# Patient Record
Sex: Male | Born: 1980 | Race: Black or African American | Hispanic: No | Marital: Single | Smoking: Former smoker
Health system: Southern US, Community
[De-identification: ages and names within clinical notes are randomized; demographics above are authoritative.]

## PROBLEM LIST (undated history)

## (undated) DIAGNOSIS — G8929 Other chronic pain: Secondary | ICD-10-CM

## (undated) DIAGNOSIS — F209 Schizophrenia, unspecified: Secondary | ICD-10-CM

## (undated) DIAGNOSIS — Z9119 Patient's noncompliance with other medical treatment and regimen: Secondary | ICD-10-CM

## (undated) DIAGNOSIS — F329 Major depressive disorder, single episode, unspecified: Secondary | ICD-10-CM

## (undated) DIAGNOSIS — F4310 Post-traumatic stress disorder, unspecified: Secondary | ICD-10-CM

## (undated) DIAGNOSIS — F419 Anxiety disorder, unspecified: Secondary | ICD-10-CM

## (undated) DIAGNOSIS — R519 Headache, unspecified: Secondary | ICD-10-CM

## (undated) DIAGNOSIS — R51 Headache: Secondary | ICD-10-CM

## (undated) DIAGNOSIS — F319 Bipolar disorder, unspecified: Secondary | ICD-10-CM

## (undated) DIAGNOSIS — F32A Depression, unspecified: Secondary | ICD-10-CM

## (undated) DIAGNOSIS — K5090 Crohn's disease, unspecified, without complications: Secondary | ICD-10-CM

## (undated) DIAGNOSIS — M1990 Unspecified osteoarthritis, unspecified site: Secondary | ICD-10-CM

## (undated) DIAGNOSIS — I10 Essential (primary) hypertension: Secondary | ICD-10-CM

## (undated) DIAGNOSIS — K219 Gastro-esophageal reflux disease without esophagitis: Secondary | ICD-10-CM

## (undated) HISTORY — DX: Other chronic pain: G89.29

## (undated) HISTORY — DX: Headache, unspecified: R51.9

## (undated) HISTORY — DX: Bipolar disorder, unspecified: F31.9

## (undated) HISTORY — DX: Crohn's disease, unspecified, without complications: K50.90

## (undated) HISTORY — DX: Depression, unspecified: F32.A

## (undated) HISTORY — DX: Major depressive disorder, single episode, unspecified: F32.9

## (undated) HISTORY — DX: Essential (primary) hypertension: I10

## (undated) HISTORY — DX: Headache: R51

## (undated) HISTORY — PX: ILEOSTOMY: SHX1783

## (undated) HISTORY — DX: Anxiety disorder, unspecified: F41.9

## (undated) HISTORY — DX: Schizophrenia, unspecified: F20.9

## (undated) HISTORY — DX: Unspecified osteoarthritis, unspecified site: M19.90

## (undated) HISTORY — DX: Gastro-esophageal reflux disease without esophagitis: K21.9

## (undated) HISTORY — PX: NO PAST SURGERIES: SHX2092

---

## 2001-11-14 ENCOUNTER — Inpatient Hospital Stay (HOSPITAL_COMMUNITY): Admission: EM | Admit: 2001-11-14 | Discharge: 2001-11-15 | Payer: Self-pay | Admitting: *Deleted

## 2011-07-28 ENCOUNTER — Ambulatory Visit: Payer: Self-pay | Admitting: Family Medicine

## 2013-04-11 ENCOUNTER — Encounter: Payer: Self-pay | Admitting: Physician Assistant

## 2013-04-11 ENCOUNTER — Ambulatory Visit (INDEPENDENT_AMBULATORY_CARE_PROVIDER_SITE_OTHER): Payer: Medicare Other | Admitting: Physician Assistant

## 2013-04-11 VITALS — BP 132/74 | HR 72 | Temp 97.6°F | Resp 16 | Ht 66.0 in | Wt 198.0 lb

## 2013-04-11 DIAGNOSIS — F319 Bipolar disorder, unspecified: Secondary | ICD-10-CM

## 2013-04-11 DIAGNOSIS — Z0000 Encounter for general adult medical examination without abnormal findings: Secondary | ICD-10-CM

## 2013-04-11 DIAGNOSIS — K5090 Crohn's disease, unspecified, without complications: Secondary | ICD-10-CM

## 2013-04-11 DIAGNOSIS — F209 Schizophrenia, unspecified: Secondary | ICD-10-CM

## 2013-04-11 DIAGNOSIS — G43009 Migraine without aura, not intractable, without status migrainosus: Secondary | ICD-10-CM

## 2013-04-11 DIAGNOSIS — R109 Unspecified abdominal pain: Secondary | ICD-10-CM

## 2013-04-11 DIAGNOSIS — K219 Gastro-esophageal reflux disease without esophagitis: Secondary | ICD-10-CM

## 2013-04-11 NOTE — Progress Notes (Signed)
Patient ID: Jeffery Mcintosh is a 33 y.o. male DOB: [phone] MRN: [id number]     HPI:  Patient is a 33 year old male who presents to the office to establish care. Reports history of bipolar and schizophrenia treated with Gabapentin 300 mg and Seroquel 25 mg daily. Follows with [facility]. Reports history of crohn's as a young adult. Reports he was on prednisone and two other unknown medications back when first diagnosed however, has not been on medications for some time. Reports he has intermittent blood in his stool along with intermittent diarrhea. Is not currently having bloody stool. Reports he is having constant dull abdominal pain for the last month with intermittent sharp pains. Reports pain is similar to his past pain when diagnosed with crohn's. Symptoms are worse at bedtime and in the morning with no known triggers. Do not become worse or better with introduction of food. Laying in fetal position offers moderate relief of pain. Also reports history of heartburn, states he has it all day everyday not based on what he eats. Has used Prevacid in the past with little relief of symptoms. Denies fever, chills, weight loss, chest pain/palpitations, vomiting, lightheaded, dizzy or weakness. Has not had colonoscopy since 1998.    Influenza: does not take Tetanus: unknown   ROS: As stated in HPI. All other systems negative  Past Medical History  Diagnosis Date  . Arthritis   . Depression   . GERD (gastroesophageal reflux disease)   . Hypertension   . Crohn's disease history of  . Bipolar affective   . Schizophrenic disorder    No family history on file. History   Social History  . Marital Status: Single    Spouse Name: N/A    Number of Children: N/A  . Years of Education: N/A   Social History Main Topics  . Smoking status: Current Every Day Smoker  . Smokeless tobacco: None  . Alcohol Use: No  . Drug Use: No  . Sexual Activity: None   Other Topics  Concern  . None   Social History Narrative  . None   History reviewed. No pertinent past surgical history. No current outpatient prescriptions on file prior to visit.   No current facility-administered medications on file prior to visit.   Allergies not on file  PE:  Filed Vitals:   04/11/13 1319  BP: 132/74  Pulse: 72  Temp: 97.6 F (36.4 C)  Resp: 16    CONSTITUTIONAL: Well developed, well nourished, pleasant, appears stated age, in NAD HEENT: normocephalic, atraumatic, bilateral ext/int canals normal. Bilateral TM's without injections, bulging, erythema. Nose normal, uvula midline, oropharynx clear and moist. EYES: PERRLA, bilateral EOM and conjunctiva normal NECK: FROM, supple, without thyromegaly or mass CARDIO: RRR, normal S1 and S2, distal pulses intact. PULM/CHEST CTA bilateral, no wheezes, rales or rhonchi. Non tender. ABD: appearance normal, soft, generalized tenderness of bilateral U/L quadrants. No rebound or guarding. Normal bowel sounds x 4 quadrants, nonpalpable liver, kidney or spleen.  MUSC: FROM U/LE bilateral, FROM thoracic and lumbar spine, no midline tenderness.  LYMPH: no cervical, supraclavicular adenopathy NEURO: alert and oriented x 3, no cranial nerve deficit, motor strength and coordination NL. DTR's intact. Gait normal. SKIN: warm, dry, no rash or lesions noted. PSYCH: Mood and affect normal, speech normal.   ASSESSMENT and PLAN   CPX/v70.0 - Patient has been counseled on age-appropriate routine health concerns for screening and prevention. These are reviewed and up-to-date. Immunizations are up-to-date or declined. Labs  ordered and will be reviewed.  Abdominal pain Generalized pain, history of crohn's Referral to GI  GERD Samples of Nexium 20 mg one tab daily samples for 10 days  Bipolar/schizophrenia Continue with current medications as prescribed Continue follow up with [facility] mental health.

## 2013-04-11 NOTE — Patient Instructions (Signed)
It was great meeting you today Jeffery Mcintosh!  Labs have been ordered for you, when you report to lab please be fasting.    I have provided samples of Nexium 20 mg tablet, please take once daily for heartburn relief.  I have placed a referral to gastroenterology for evaluation of your crohn's, the patient care coordinator will phone you to make arrangements.     Health Maintenance, Males A healthy lifestyle and preventative care can promote health and wellness.  Maintain regular health, dental, and eye exams.  Eat a healthy diet. Foods like vegetables, fruits, whole grains, low-fat dairy products, and lean protein foods contain the nutrients you need and are low in calories. Decrease your intake of foods high in solid fats, added sugars, and salt. Get information about a proper diet from your health care provider, if necessary.  Regular physical exercise is one of the most important things you can do for your health. Most adults should get at least 150 minutes of moderate-intensity exercise (any activity that increases your heart rate and causes you to sweat) each week. In addition, most adults need muscle-strengthening exercises on 2 or more days a week.   Maintain a healthy weight. The body mass index (BMI) is a screening tool to identify possible weight problems. It provides an estimate of body fat based on height and weight. Your health care provider can find your BMI and can help you achieve or maintain a healthy weight. For males 20 years and older:  A BMI below 18.5 is considered underweight.  A BMI of 18.5 to 24.9 is normal.  A BMI of 25 to 29.9 is considered overweight.  A BMI of 30 and above is considered obese.  Maintain normal blood lipids and cholesterol by exercising and minimizing your intake of saturated fat. Eat a balanced diet with plenty of fruits and vegetables. Blood tests for lipids and cholesterol should begin at age 65 and be repeated every 5 years. If your  lipid or cholesterol levels are high, you are over 50, or you are at high risk for heart disease, you may need your cholesterol levels checked more frequently.Ongoing high lipid and cholesterol levels should be treated with medicines, if diet and exercise are not working.  If you smoke, find out from your health care provider how to quit. If you do not use tobacco, do not start.  Lung cancer screening is recommended for adults aged 61 80 years who are at high risk for developing lung cancer because of a history of smoking. A yearly low-dose CT scan of the lungs is recommended for people who have at least a 30-pack-year history of smoking and are a current smoker or have quit within the past 15 years. A pack year of smoking is smoking an average of 1 pack of cigarettes a day for 1 year (for example, a 30-pack-year history of smoking could mean smoking 1 pack a day for 30 years or 2 packs a day for 15 years). Yearly screening should continue until the smoker has stopped smoking for at least 15 years. Yearly screening should be stopped for people who develop a health problem that would prevent them from having lung cancer treatment.  If you choose to drink alcohol, do not have more than 2 drinks per day. One drink is considered to be 12 oz (360 mL) of beer, 5 oz (150 mL) of wine, or 1.5 oz (45 mL) of liquor.  Avoid use of street drugs. Do not share needles  with anyone. Ask for help if you need support or instructions about stopping the use of drugs.  High blood pressure causes heart disease and increases the risk of stroke. Blood pressure should be checked at least every 1 2 years. Ongoing high blood pressure should be treated with medicines if weight loss and exercise are not effective.  If you are 32 32 years old, ask your health care provider if you should take aspirin to prevent heart disease.  Diabetes screening involves taking a blood sample to check your fasting blood sugar level. This should be  done once every 3 years after age 32, if you are at a normal weight and without risk factors for diabetes. Testing should be considered at a younger age or be carried out more frequently if you are overweight and have at least 1 risk factor for diabetes.  Colorectal cancer can be detected and often prevented. Most routine colorectal cancer screening begins at the age of 32 and continues through age 75. However, your health care provider may recommend screening at an earlier age if you have risk factors for colon cancer. On a yearly basis, your health care provider may provide home test kits to check for hidden blood in the stool. A small camera at the end of a tube may be used to directly examine the colon (sigmoidoscopy or colonoscopy) to detect the earliest forms of colorectal cancer. Talk to your health care provider about this at age 32, when routine screening begins. A direct exam of the colon should be repeated every 5 10 years through age 79, unless early forms of pre-cancerous polyps or small growths are found.  People who are at an increased risk for hepatitis B should be screened for this virus. You are considered at high risk for hepatitis B if:  You were born in a country where hepatitis B occurs often. Talk with your health care provider about which countries are considered high-risk.  Your parents were born in a high-risk country and you have not received a shot to protect against hepatitis B (hepatitis B vaccine).  You have HIV or AIDS.  You use needles to inject street drugs.  You live with, or have sex with, someone who has hepatitis B.  You are a man who has sex with other men (MSM).  You get hemodialysis treatment.  You take certain medicines for conditions like cancer, organ transplantation, and autoimmune conditions.  Hepatitis C blood testing is recommended for all people born from [birth year] through 1964 and any individual with known risk factors for hepatitis C.  Healthy men  should no longer receive prostate-specific antigen (PSA) blood tests as part of routine cancer screening. Talk to your health care provider about prostate cancer screening.  Testicular cancer screening is not recommended for adolescents or adult males who have no symptoms. Screening includes self-exam, a health care provider exam, and other screening tests. Consult with your health care provider about any symptoms you have or any concerns you have about testicular cancer.  Practice safe sex. Use condoms and avoid high-risk sexual practices to reduce the spread of sexually transmitted infections (STIs).  Use sunscreen. Apply sunscreen liberally and repeatedly throughout the day. You should seek shade when your shadow is shorter than you. Protect yourself by wearing long sleeves, pants, a wide-brimmed hat, and sunglasses year round, whenever you are outdoors.  Tell your health care provider of new moles or changes in moles, especially if there is a change in shape or color.  Also tell your provider if a mole is larger than the size of a pencil eraser.  A one-time screening for abdominal aortic aneurysm (AAA) and surgical repair of large AAAs by ultrasound is recommended for men aged 75 75 years who are current or former smokers.  Stay current with your vaccines (immunizations). Document Released: 07/19/2007 Document Revised: 11/10/2012 Document Reviewed: 06/17/2010 [facility] Patient Information 2014 [place], [place].      Diet for Gastroesophageal Reflux Disease, Adult Reflux is when stomach acid flows up into the esophagus. The esophagus becomes irritated and sore (inflammation). When reflux happens often and is severe, it is called gastroesophageal reflux disease (GERD). What you eat can help ease any discomfort caused by GERD. FOODS OR DRINKS TO AVOID OR LIMIT  Coffee and black tea, with or without caffeine.  Bubbly (carbonated) drinks with caffeine or energy drinks.  Strong spices, such as  pepper, cayenne pepper, curry, or chili powder.  Peppermint or spearmint.  Chocolate.  High-fat foods, such as meats, fried food, oils, butter, or nuts.  Fruits and vegetables that cause discomfort. This includes citrus fruits and tomatoes.  Alcohol. If a certain food or drink irritates your GERD, avoid eating or drinking it. THINGS THAT MAY HELP GERD INCLUDE:  Eat meals slowly.  Eat 5 to 6 small meals a day, not 3 large meals.  Do not eat food for a certain amount of time if it causes discomfort.  Wait 3 hours after eating before lying down.  Keep the head of your bed raised 6 to 9 inches (15 23 centimeters). Put a foam wedge or blocks under the legs of the bed.  Stay active. Weight loss, if needed, may help ease your discomfort.  Wear loose-fitting clothing.  Do not smoke or chew tobacco. Document Released: 07/22/2011 Document Reviewed: 07/22/2011 [facility] Patient Information 2014 [place].     Crohn's Disease Crohn's disease is a long-term (chronic) soreness and redness (inflammation) of the intestines (bowel). It can affect any portion of the digestive tract, from the mouth to the anus. It can also cause problems outside the digestive tract. Crohn's disease is closely related to a disease called ulcerative colitis (together, these two diseases are called inflammatory bowel disease).  CAUSES  The cause of Crohn's disease is not known. One Link Snuffer is that, in an easily affected person, the immune system is triggered to attack the body's own digestive tissue. Crohn's disease runs in families. It seems to be more common in certain geographic areas and amongst certain races. There are no clear-cut dietary causes.  SYMPTOMS  Crohn's disease can cause many different symptoms since it can affect many different parts of the body. Symptoms include:  Fatigue.  Weight loss.  Chronic diarrhea, sometime bloody.  Abdominal pain and cramps.  Fever.  Ulcers or canker  sores in the mouth or rectum.  Anemia (low red blood cells).  Arthritis, skin problems, and eye problems may occur. Complications of Crohn's disease can include:  Series of holes (perforation) of the bowel.  Portions of the intestines sticking to each other (adhesions).  Obstruction of the bowel.  Fistula formation, typically in the rectal area but also in other areas. A fistula is an opening between the bowels and the outside, or between the bowels and another organ.  A painful crack in the mucous membrane of the anus (rectal fissure). DIAGNOSIS  Your caregiver may suspect Crohn's disease based on your symptoms and an exam. Blood tests may confirm that there is a problem. You  may be asked to submit a stool specimen for examination. X-rays and CT scans may be necessary. Ultimately, the diagnosis is usually made after a procedure that uses a flexible tube that is inserted via your mouth or your anus. This is done under sedation and is called either an upper endoscopy or colonoscopy. With these tests, the specialist can take tiny tissue samples and remove them from the inside of the bowel (biopsy). Examination of this biopsy tissue under a microscope can reveal Crohn's disease as the cause of your symptoms. Due to the many different forms that Crohn's disease can take, symptoms may be present for several years before a diagnosis is made. TREATMENT  Medications are often used to decrease inflammation and control the immune system. These include medicines related to aspirin, steroid medications, and newer and stronger medications to slow down the immune system. Some medications may be used as suppositories or enemas. A number of other medications are used or have been studied. Your caregiver will make specific recommendations. HOME CARE INSTRUCTIONS   Symptoms such as diarrhea can be controlled with medications. Avoid foods that have a laxative effect such as fresh fruit, vegetables and dairy  products. During flare ups, you can rest your bowel by refraining from solid foods. Drink clear liquids frequently during the day (electrolyte or re-hydrating fluids are best. Your caregiver can help you with suggestions). Drink often to prevent loss of body fluids (dehydration). When diarrhea has cleared, eat small meals and more frequently. Avoid food additives and stimulants such as caffeine (coffee, tea, or chocolate). Enzyme supplements may help if you develop intolerance to a sugar in dairy products (lactose). Ask your caregiver or dietitian about specific dietary instructions.  Try to maintain a positive attitude. Learn relaxation techniques such as self hypnosis, mental imaging, and muscle relaxation.  If possible, avoid stresses which can aggravate your condition.  Exercise regularly.  Follow your diet.  Always get plenty of rest. SEEK MEDICAL CARE IF:   Your symptoms fail to improve after a week or two of new treatment.  You experience continued weight loss.  You have ongoing cramps or loose bowels.  You develop a new skin rash, skin sores, or eye problems. SEEK IMMEDIATE MEDICAL CARE IF:   You have worsening of your symptoms or develop new symptoms.  You have a fever.  You develop bloody diarrhea.  You develop severe abdominal pain. MAKE SURE YOU:   Understand these instructions.  Will watch your condition.  Will get help right away if you are not doing well or get worse. Document Released: 10/30/2004 Document Revised: 05/17/2012 Document Reviewed: 09/28/2006 [facility] Patient Information 2014 [place], [place].

## 2013-04-11 NOTE — Progress Notes (Signed)
Pre visit review using our clinic review tool, if applicable. No additional management support is needed unless otherwise documented below in the visit note. 

## 2013-04-12 ENCOUNTER — Encounter: Payer: Self-pay | Admitting: Physician Assistant

## 2013-04-12 ENCOUNTER — Encounter: Payer: Self-pay | Admitting: Gastroenterology

## 2013-04-17 ENCOUNTER — Emergency Department (HOSPITAL_COMMUNITY)
Admission: EM | Admit: 2013-04-17 | Discharge: 2013-04-17 | Disposition: A | Discharge status: Home / Self Care | Payer: Medicare Other | Attending: Emergency Medicine | Admitting: Emergency Medicine

## 2013-04-17 ENCOUNTER — Emergency Department (HOSPITAL_COMMUNITY): Payer: Medicare Other

## 2013-04-17 ENCOUNTER — Encounter (HOSPITAL_COMMUNITY): Payer: Self-pay | Admitting: Emergency Medicine

## 2013-04-17 DIAGNOSIS — F411 Generalized anxiety disorder: Secondary | ICD-10-CM | POA: Insufficient documentation

## 2013-04-17 DIAGNOSIS — F319 Bipolar disorder, unspecified: Secondary | ICD-10-CM | POA: Insufficient documentation

## 2013-04-17 DIAGNOSIS — Z79899 Other long term (current) drug therapy: Secondary | ICD-10-CM | POA: Insufficient documentation

## 2013-04-17 DIAGNOSIS — R209 Unspecified disturbances of skin sensation: Secondary | ICD-10-CM | POA: Insufficient documentation

## 2013-04-17 DIAGNOSIS — R200 Anesthesia of skin: Secondary | ICD-10-CM

## 2013-04-17 DIAGNOSIS — Z8719 Personal history of other diseases of the digestive system: Secondary | ICD-10-CM | POA: Insufficient documentation

## 2013-04-17 DIAGNOSIS — G8929 Other chronic pain: Secondary | ICD-10-CM | POA: Insufficient documentation

## 2013-04-17 DIAGNOSIS — L02619 Cutaneous abscess of unspecified foot: Secondary | ICD-10-CM | POA: Insufficient documentation

## 2013-04-17 DIAGNOSIS — L03119 Cellulitis of unspecified part of limb: Secondary | ICD-10-CM

## 2013-04-17 DIAGNOSIS — F17200 Nicotine dependence, unspecified, uncomplicated: Secondary | ICD-10-CM | POA: Insufficient documentation

## 2013-04-17 DIAGNOSIS — I10 Essential (primary) hypertension: Secondary | ICD-10-CM | POA: Insufficient documentation

## 2013-04-17 MED ORDER — HYDROCODONE-ACETAMINOPHEN 5-325 MG PO TABS: 2.0000 | ORAL_TABLET | Freq: Once | ORAL | Status: DC

## 2013-04-17 MED ORDER — CEPHALEXIN 500 MG PO CAPS: 500.0000 mg | ORAL_CAPSULE | Freq: Four times a day (QID) | ORAL | Status: DC

## 2013-04-17 MED ORDER — IBUPROFEN 600 MG PO TABS: 600.0000 mg | ORAL_TABLET | Freq: Four times a day (QID) | ORAL | Status: DC | PRN

## 2013-04-17 MED ORDER — HYDROCODONE-ACETAMINOPHEN 5-325 MG PO TABS: 1.0000 | ORAL_TABLET | Freq: Four times a day (QID) | ORAL | Status: DC | PRN

## 2013-04-17 MED ORDER — IBUPROFEN 200 MG PO TABS
600.0000 mg | ORAL_TABLET | Freq: Once | ORAL | Status: AC
  Administered 2013-04-17: 600 mg via ORAL
  Filled 2013-04-17: qty 3

## 2013-04-17 MED ORDER — HYDROCODONE-ACETAMINOPHEN 5-325 MG PO TABS
1.0000 | ORAL_TABLET | Freq: Once | ORAL | Status: AC
  Administered 2013-04-17: 1 via ORAL
  Filled 2013-04-17: qty 1

## 2013-04-17 NOTE — Discharge Instructions (Signed)
We saw you in the ER for the foot pain and numbness All the results in the ER are normal, labs and imaging. We think the foot pain is from a mild infection fo the skin. Please take the meds as prescribed. We are not sure what is causing your symptoms as far as the numbness is concerned, but given the family hx of MS - please report this to your doctor. The workup in the ER is not complete, and is limited to screening for life threatening and emergent conditions only, so please see a primary care doctor for further evaluation.    Cellulitis Cellulitis is an infection of the skin and the tissue beneath it. The infected area is usually red and tender. Cellulitis occurs most often in the arms and lower legs.  CAUSES  Cellulitis is caused by bacteria that enter the skin through cracks or cuts in the skin. The most common types of bacteria that cause cellulitis are Staphylococcus and Streptococcus. SYMPTOMS   Redness and warmth.  Swelling.  Tenderness or pain.  Fever. DIAGNOSIS  Your caregiver can usually determine what is wrong based on a physical exam. Blood tests may also be done. TREATMENT  Treatment usually involves taking an antibiotic medicine. HOME CARE INSTRUCTIONS   Take your antibiotics as directed. Finish them even if you start to feel better.  Keep the infected arm or leg elevated to reduce swelling.  Apply a warm cloth to the affected area up to 4 times per day to relieve pain.  Only take over-the-counter or prescription medicines for pain, discomfort, or fever as directed by your caregiver.  Keep all follow-up appointments as directed by your caregiver. SEEK MEDICAL CARE IF:   You notice red streaks coming from the infected area.  Your red area gets larger or turns dark in color.  Your bone or joint underneath the infected area becomes painful after the skin has healed.  Your infection returns in the same area or another area.  You notice a swollen bump in the  infected area.  You develop new symptoms. SEEK IMMEDIATE MEDICAL CARE IF:   You have a fever.  You feel very sleepy.  You develop vomiting or diarrhea.  You have a general ill feeling (malaise) with muscle aches and pains. MAKE SURE YOU:   Understand these instructions.  Will watch your condition.  Will get help right away if you are not doing well or get worse. Document Released: 10/30/2004 Document Revised: 07/22/2011 Document Reviewed: 04/07/2011 [facility] Patient Information 2014 [place]. RICE: Routine Care for Injuries The routine care of many injuries includes Rest, Ice, Compression, and Elevation (RICE). HOME CARE INSTRUCTIONS  Rest is needed to allow your body to heal. Routine activities can usually be resumed when comfortable. Injured tendons and bones can take up to 6 weeks to heal. Tendons are the cord-like structures that attach muscle to bone.  Ice following an injury helps keep the swelling down and reduces pain.  Put ice in a plastic bag.  Place a towel between your skin and the bag.  Leave the ice on for 15-20 minutes, 03-04 times a day. Do this while awake, for the first 24 to 48 hours. After that, continue as directed by your caregiver.  Compression helps keep swelling down. It also gives support and helps with discomfort. If an elastic bandage has been applied, it should be removed and reapplied every 3 to 4 hours. It should not be applied tightly, but firmly enough to keep swelling  down. Watch fingers or toes for swelling, bluish discoloration, coldness, numbness, or excessive pain. If any of these problems occur, remove the bandage and reapply loosely. Contact your caregiver if these problems continue.  Elevation helps reduce swelling and decreases pain. With extremities, such as the arms, hands, legs, and feet, the injured area should be placed near or above the level of the heart, if possible. SEEK IMMEDIATE MEDICAL CARE IF:  You have persistent  pain and swelling.  You develop redness, numbness, or unexpected weakness.  Your symptoms are getting worse rather than improving after several days. These symptoms may indicate that further evaluation or further X-rays are needed. Sometimes, X-rays may not show a small broken bone (fracture) until 1 week or 10 days later. Make a follow-up appointment with your caregiver. Ask when your X-ray results will be ready. Make sure you get your X-ray results. Document Released: 05/04/2000 Document Revised: 04/14/2011 Document Reviewed: 06/21/2010 [facility] Patient Information 2014 [place], [place].

## 2013-04-17 NOTE — ED Notes (Signed)
Pt discharged.Vital signs stable and GCS 15 

## 2013-04-17 NOTE — ED Notes (Signed)
Pt. injured his right foot while walking last Thursday , reports pain when walking / ambulatory.

## 2013-04-18 ENCOUNTER — Other Ambulatory Visit (INDEPENDENT_AMBULATORY_CARE_PROVIDER_SITE_OTHER): Payer: Medicare Other

## 2013-04-18 ENCOUNTER — Ambulatory Visit (INDEPENDENT_AMBULATORY_CARE_PROVIDER_SITE_OTHER): Payer: Medicare Other | Admitting: Gastroenterology

## 2013-04-18 ENCOUNTER — Ambulatory Visit (INDEPENDENT_AMBULATORY_CARE_PROVIDER_SITE_OTHER): Payer: Medicare Other | Admitting: Physician Assistant

## 2013-04-18 ENCOUNTER — Telehealth: Payer: Self-pay | Admitting: Gastroenterology

## 2013-04-18 ENCOUNTER — Ambulatory Visit: Payer: Medicare Other

## 2013-04-18 ENCOUNTER — Encounter: Payer: Self-pay | Admitting: Physician Assistant

## 2013-04-18 ENCOUNTER — Encounter: Payer: Self-pay | Admitting: Gastroenterology

## 2013-04-18 VITALS — BP 131/81 | HR 86 | Temp 98.3°F | Resp 16 | Ht 65.25 in | Wt 196.5 lb

## 2013-04-18 VITALS — BP 134/78 | HR 80 | Ht 65.25 in | Wt 196.1 lb

## 2013-04-18 DIAGNOSIS — G43009 Migraine without aura, not intractable, without status migrainosus: Secondary | ICD-10-CM

## 2013-04-18 DIAGNOSIS — F319 Bipolar disorder, unspecified: Secondary | ICD-10-CM

## 2013-04-18 DIAGNOSIS — G5620 Lesion of ulnar nerve, unspecified upper limb: Secondary | ICD-10-CM

## 2013-04-18 DIAGNOSIS — F209 Schizophrenia, unspecified: Secondary | ICD-10-CM

## 2013-04-18 DIAGNOSIS — R82998 Other abnormal findings in urine: Secondary | ICD-10-CM

## 2013-04-18 DIAGNOSIS — R197 Diarrhea, unspecified: Secondary | ICD-10-CM

## 2013-04-18 DIAGNOSIS — R8290 Unspecified abnormal findings in urine: Secondary | ICD-10-CM

## 2013-04-18 DIAGNOSIS — R6889 Other general symptoms and signs: Secondary | ICD-10-CM

## 2013-04-18 DIAGNOSIS — M109 Gout, unspecified: Secondary | ICD-10-CM

## 2013-04-18 DIAGNOSIS — K219 Gastro-esophageal reflux disease without esophagitis: Secondary | ICD-10-CM

## 2013-04-18 DIAGNOSIS — Z0000 Encounter for general adult medical examination without abnormal findings: Secondary | ICD-10-CM

## 2013-04-18 DIAGNOSIS — R109 Unspecified abdominal pain: Secondary | ICD-10-CM

## 2013-04-18 DIAGNOSIS — K5090 Crohn's disease, unspecified, without complications: Secondary | ICD-10-CM

## 2013-04-18 LAB — URINALYSIS, ROUTINE W REFLEX MICROSCOPIC
BILIRUBIN URINE: NEGATIVE
Bilirubin Urine: NEGATIVE
HGB URINE DIPSTICK: NEGATIVE
Hgb urine dipstick: NEGATIVE
Ketones, ur: NEGATIVE
Ketones, ur: NEGATIVE
Leukocytes, UA: NEGATIVE
Leukocytes, UA: NEGATIVE
Nitrite: NEGATIVE
Nitrite: NEGATIVE
PH: 5.5 (ref 5.0–8.0)
RBC / HPF: NONE SEEN (ref 0–?)
Specific Gravity, Urine: 1.015 (ref 1.000–1.030)
Total Protein, Urine: NEGATIVE
URINE GLUCOSE: NEGATIVE
URINE GLUCOSE: NEGATIVE
Urobilinogen, UA: 0.2 (ref 0.0–1.0)
Urobilinogen, UA: 0.2 (ref 0.0–1.0)
pH: 7 (ref 5.0–8.0)

## 2013-04-18 LAB — CBC WITH DIFFERENTIAL/PLATELET
BASOS PCT: 0.3 % (ref 0.0–3.0)
Basophils Absolute: 0 10*3/uL (ref 0.0–0.1)
EOS ABS: 0.3 10*3/uL (ref 0.0–0.7)
Eosinophils Relative: 3.8 % (ref 0.0–5.0)
HCT: 45 % (ref 39.0–52.0)
Hemoglobin: 15.4 g/dL (ref 13.0–17.0)
LYMPHS PCT: 22.1 % (ref 12.0–46.0)
Lymphs Abs: 1.7 10*3/uL (ref 0.7–4.0)
MCHC: 34.2 g/dL (ref 30.0–36.0)
MCV: 86.8 fl (ref 78.0–100.0)
MONOS PCT: 10.2 % (ref 3.0–12.0)
Monocytes Absolute: 0.8 10*3/uL (ref 0.1–1.0)
NEUTROS PCT: 63.6 % (ref 43.0–77.0)
Neutro Abs: 5 10*3/uL (ref 1.4–7.7)
Platelets: 268 10*3/uL (ref 150.0–400.0)
RBC: 5.19 Mil/uL (ref 4.22–5.81)
RDW: 14.8 % — ABNORMAL HIGH (ref 11.5–14.6)
WBC: 7.9 10*3/uL (ref 4.5–10.5)

## 2013-04-18 LAB — HEPATIC FUNCTION PANEL
ALK PHOS: 126 U/L — AB (ref 39–117)
ALT: 31 U/L (ref 0–53)
AST: 22 U/L (ref 0–37)
Albumin: 4.1 g/dL (ref 3.5–5.2)
BILIRUBIN DIRECT: 0.1 mg/dL (ref 0.0–0.3)
TOTAL PROTEIN: 7.4 g/dL (ref 6.0–8.3)
Total Bilirubin: 0.6 mg/dL (ref 0.3–1.2)

## 2013-04-18 LAB — BASIC METABOLIC PANEL
BUN: 8 mg/dL (ref 6–23)
CO2: 27 mEq/L (ref 19–32)
CREATININE: 0.9 mg/dL (ref 0.4–1.5)
Calcium: 9.2 mg/dL (ref 8.4–10.5)
Chloride: 105 mEq/L (ref 96–112)
GFR: 131.25 mL/min (ref >60.00)
Glucose, Bld: 103 mg/dL — ABNORMAL HIGH (ref 70–99)
POTASSIUM: 4.2 meq/L (ref 3.5–5.1)
Sodium: 141 mEq/L (ref 135–145)

## 2013-04-18 LAB — C-REACTIVE PROTEIN: CRP: 3.3 mg/dL (ref 0.5–20.0)

## 2013-04-18 LAB — LIPASE: LIPASE: 23 U/L (ref 11.0–59.0)

## 2013-04-18 LAB — TSH: TSH: 3.82 u[IU]/mL (ref 0.35–5.50)

## 2013-04-18 LAB — SEDIMENTATION RATE: Sed Rate: 24 mm/hr — ABNORMAL HIGH (ref 0–22)

## 2013-04-18 LAB — AMYLASE: Amylase: 59 U/L (ref 27–131)

## 2013-04-18 LAB — URIC ACID: Uric Acid, Serum: 6.6 mg/dL (ref 4.0–7.8)

## 2013-04-18 MED ORDER — COLCHICINE 0.6 MG PO TABS: ORAL_TABLET | ORAL | Status: DC

## 2013-04-18 MED ORDER — HYDROCODONE-ACETAMINOPHEN 10-325 MG PO TABS: 1.0000 | ORAL_TABLET | Freq: Three times a day (TID) | ORAL | Status: DC | PRN

## 2013-04-18 MED ORDER — DICYCLOMINE HCL 20 MG PO TABS: 20.0000 mg | ORAL_TABLET | Freq: Three times a day (TID) | ORAL | Status: DC

## 2013-04-18 MED ORDER — HYOSCYAMINE SULFATE 0.125 MG SL SUBL: SUBLINGUAL_TABLET | SUBLINGUAL | Status: DC

## 2013-04-18 NOTE — Patient Instructions (Addendum)
You have been given a separate informational sheet regarding your tobacco use, the importance of quitting and local resources to help you quit.  Your physician has requested that you go to the basement for lab work before leaving today in addition to lab work you are already scheduled to have.   Please follow instructions on Hemoccult cards and mail them back to [place] when finished.   Take either Nexium or Prevacid once daily that you have at home.  Patient advised to avoid spicy, acidic, citrus, chocolate, mints, fruit and fruit juices.  Limit the intake of caffeine, alcohol and Soda.  Don't exercise too soon after eating.  Don't lie down within 3-4 hours of eating.  Elevate the head of your bed.  We will try to get your records from [place].   Thank you for choosing me and [facility].  Pricilla Riffle. Dagoberto Ligas., MD., Marval Regal

## 2013-04-18 NOTE — Patient Instructions (Signed)
Please take medication as prescribed.  Elevate foot.  Avoid alcohol and red meats.  Please obtain labs. I will call you with your results.  YOu will be contacted by Sports Medicine physician.  Continue antibiotic. If symptoms acutely worsen, please proceed to the ER.  Gout Gout is an inflammatory arthritis caused by a buildup of uric acid crystals in the joints. Uric acid is a chemical that is normally present in the blood. When the level of uric acid in the blood is too high it can form crystals that deposit in your joints and tissues. This causes joint redness, soreness, and swelling (inflammation). Repeat attacks are common. Over time, uric acid crystals can form into masses (tophi) near a joint, destroying bone and causing disfigurement. Gout is treatable and often preventable. CAUSES  The disease begins with elevated levels of uric acid in the blood. Uric acid is produced by your body when it breaks down a naturally found substance called purines. Certain foods you eat, such as meats and fish, contain high amounts of purines. Causes of an elevated uric acid level include:  Being passed down from parent to child (heredity).  Diseases that cause increased uric acid production (such as obesity, psoriasis, and certain cancers).  Excessive alcohol use.  Diet, especially diets rich in meat and seafood.  Medicines, including certain cancer-fighting medicines (chemotherapy), water pills (diuretics), and aspirin.  Chronic kidney disease. The kidneys are no longer able to remove uric acid well.  Problems with metabolism. Conditions strongly associated with gout include:  Obesity.  High blood pressure.  High cholesterol.  Diabetes. Not everyone with elevated uric acid levels gets gout. It is not understood why some people get gout and others do not. Surgery, joint injury, and eating too much of certain foods are some of the factors that can lead to gout attacks. SYMPTOMS   An attack of gout  comes on quickly. It causes intense pain with redness, swelling, and warmth in a joint.  Fever can occur.  Often, only one joint is involved. Certain joints are more commonly involved:  Base of the big toe.  Knee.  Ankle.  Wrist.  Finger. Without treatment, an attack usually goes away in a few days to weeks. Between attacks, you usually will not have symptoms, which is different from many other forms of arthritis. DIAGNOSIS  Your caregiver will suspect gout based on your symptoms and exam. In some cases, tests may be recommended. The tests may include:  Blood tests.  Urine tests.  X-rays.  Joint fluid exam. This exam requires a needle to remove fluid from the joint (arthrocentesis). Using a microscope, gout is confirmed when uric acid crystals are seen in the joint fluid. TREATMENT  There are two phases to gout treatment: treating the sudden onset (acute) attack and preventing attacks (prophylaxis).  Treatment of an Acute Attack.  Medicines are used. These include anti-inflammatory medicines or steroid medicines.  An injection of steroid medicine into the affected joint is sometimes necessary.  The painful joint is rested. Movement can worsen the arthritis.  You may use warm or cold treatments on painful joints, depending which works best for you.  Treatment to Prevent Attacks.  If you suffer from frequent gout attacks, your caregiver may advise preventive medicine. These medicines are started after the acute attack subsides. These medicines either help your kidneys eliminate uric acid from your body or decrease your uric acid production. You may need to stay on these medicines for a very long time.  The early phase of treatment with preventive medicine can be associated with an increase in acute gout attacks. For this reason, during the first few months of treatment, your caregiver may also advise you to take medicines usually used for acute gout treatment. Be sure you  understand your caregiver's directions. Your caregiver may make several adjustments to your medicine dose before these medicines are effective.  Discuss dietary treatment with your caregiver or dietitian. Alcohol and drinks high in sugar and fructose and foods such as meat, poultry, and seafood can increase uric acid levels. Your caregiver or dietician can advise you on drinks and foods that should be limited. HOME CARE INSTRUCTIONS   Do not take aspirin to relieve pain. This raises uric acid levels.  Only take over-the-counter or prescription medicines for pain, discomfort, or fever as directed by your caregiver.  Rest the joint as much as possible. When in bed, keep sheets and blankets off painful areas.  Keep the affected joint raised (elevated).  Apply warm or cold treatments to painful joints. Use of warm or cold treatments depends on which works best for you.  Use crutches if the painful joint is in your leg.  Drink enough fluids to keep your urine clear or pale yellow. This helps your body get rid of uric acid. Limit alcohol, sugary drinks, and fructose drinks.  Follow your dietary instructions. Pay careful attention to the amount of protein you eat. Your daily diet should emphasize fruits, vegetables, whole grains, and fat-free or low-fat milk products. Discuss the use of coffee, vitamin C, and cherries with your caregiver or dietician. These may be helpful in lowering uric acid levels.  Maintain a healthy body weight. SEEK MEDICAL CARE IF:   You develop diarrhea, vomiting, or any side effects from medicines.  You do not feel better in 24 hours, or you are getting worse. SEEK IMMEDIATE MEDICAL CARE IF:   Your joint becomes suddenly more tender, and you have chills or a fever. MAKE SURE YOU:   Understand these instructions.  Will watch your condition.  Will get help right away if you are not doing well or get worse. Document Released: 01/18/2000 Document Revised:  05/17/2012 Document Reviewed: 09/03/2011 [facility] Patient Information 2014 [place].

## 2013-04-18 NOTE — Telephone Encounter (Signed)
Patient requests alternative med. What do you want me to send?

## 2013-04-18 NOTE — Progress Notes (Signed)
Pre visit review using our clinic review tool, if applicable. No additional management support is needed unless otherwise documented below in the visit note/SLS  

## 2013-04-18 NOTE — Telephone Encounter (Signed)
Prescription sent

## 2013-04-18 NOTE — Assessment & Plan Note (Signed)
Chronic.  Supportive measures discussed. Referral to Sports medicine placed.

## 2013-04-18 NOTE — Progress Notes (Addendum)
    History of Present Illness: This is a 33 year old male complaining of epigastric and substernal burning pain intermittently for many years. In addition he has episodic crampy lower and generalized abdominal pain associated with urgent loose stools intermittently for many years. He relates being diagnosed with Crohn's disease at age 53 in [place], [place]. He states he was hospitalized a couple of occasions. He states he was treated with prednisone and one other medication on and off for about 4 years. He relocated from [place] at about age 82 and has not been seen for or treated for Crohn's disease since then. Denies weight loss, constipation, change in stool caliber, melena, hematochezia, nausea, vomiting, dysphagia, chest pain.  Review of Systems: Pertinent positive and negative review of systems were noted in the above HPI section. All other review of systems were otherwise negative.  Current Medications, Allergies, Past Medical History, Past Surgical History, Family History and Social History were reviewed in [employer] record.  Physical Exam: General: Well developed , well nourished, no acute distress Head: Normocephalic and atraumatic Eyes:  sclerae anicteric, EOMI Ears: Normal auditory acuity Mouth: No deformity or lesions Neck: Supple, no masses or thyromegaly Lungs: Clear throughout to auscultation Heart: Regular rate and rhythm; no murmurs, rubs or bruits Abdomen: Soft, non tender and non distended. No masses, hepatosplenomegaly or hernias noted. Normal Bowel sounds Musculoskeletal: Symmetrical with no gross deformities  Skin: No lesions on visible extremities Pulses:  Normal pulses noted Extremities: No clubbing, cyanosis, edema or deformities noted Neurological: Alert oriented x 4, grossly nonfocal Cervical Nodes:  No significant cervical adenopathy Inguinal Nodes: No significant inguinal adenopathy Psychological:  Alert and cooperative. Normal mood and  affect  Assessment and Recommendations:  1. Epigastric pain and burning substernal chest pain. Possible GERD. Begin OTC Nexium 20 mg daily for at least [redacted] weeks along with standard antireflux measures. Consider upper endoscopy if symptoms do not resolve.  2. Intermittent diarrhea associated with lower and generalized abdominal pain. Possible history of Crohn's disease. Attempt to obtain records from his physicians or hospital in [place]. Obtain blood work previously ordered by his PCP, CRP, stool Hemoccults stool and stool studies. Trial of hyoscyamine as needed. Consider colonoscopy. Return office visit in 4 weeks.   04/22/13. Letter regarding records request received and reviewed from [facility] in [place], [place]. They do not have any records as [place] law only requires them to keep records for 10 years in this situation.

## 2013-04-18 NOTE — Progress Notes (Signed)
Patient presents to clinic today c/o R foot pain, redness and swelling.  Patient was seen in ER yesterday and diagnosed with cellulitis. X-ray was obtained and was unremarkable. Was given prescription for Keflex. Patient endorses continued symptoms -- pain and decreased ROM centered around R great big toe.  Denies drainage, trauma or puncture wound.  Denies fever, chills, malaise. Denies history of gout but endorses a big family history of gout. Does not drink alcohol, but eats a lot of red meats.  Patient also complains of occasional numbness and tingling of IV/V phalanges of L hand x 3 months.  Has tried OTC measures with little relief of symptoms. Has history of carpal tunnel or R hand requiring surgery in the past.  Past Medical History  Diagnosis Date  . Arthritis   . Depression   . GERD (gastroesophageal reflux disease)   . Hypertension   . Crohn's disease history of  . Bipolar affective   . Schizophrenic disorder   . Anxiety   . Chronic headaches     Current Outpatient Prescriptions on File Prior to Visit  Medication Sig Dispense Refill  . cephALEXin (KEFLEX) 500 MG capsule Take 1 capsule (500 mg total) by mouth 4 (four) times daily.  20 capsule  0  . dicyclomine (BENTYL) 20 MG tablet Take 1 tablet (20 mg total) by mouth 4 (four) times daily -  before meals and at bedtime.  120 tablet  11  . gabapentin (NEURONTIN) 300 MG capsule Take 600 mg by mouth 2 (two) times daily.      Marland Kitchen ibuprofen (ADVIL,MOTRIN) 600 MG tablet Take 1 tablet (600 mg total) by mouth every 6 (six) hours as needed.  30 tablet  0   No current facility-administered medications on file prior to visit.    No Known Allergies  Family History  Problem Relation Age of Onset  . Hypertension Mother   . Alcohol abuse Father   . Mental illness Father   . Arthritis Maternal Grandmother   . Hypertension Maternal Grandmother   . Heart disease Maternal Grandmother   . Hyperlipidemia Maternal Grandfather   . Stroke  Paternal Grandmother   . Kidney disease Maternal Grandmother   . Diabetes Maternal Grandmother   . Cancer Maternal Grandmother     great, type unknown  . Colon polyps Maternal Grandfather   . Ulcerative colitis Maternal Aunt   . Multiple sclerosis Father   . Heart attack Mother     History   Social History  . Marital Status: Single    Spouse Name: N/A    Number of Children: 2  . Years of Education: N/A   Occupational History  . disabled    Social History Main Topics  . Smoking status: Current Every Day Smoker  . Smokeless tobacco: Never Used  . Alcohol Use: No  . Drug Use: No  . Sexual Activity: None   Other Topics Concern  . None   Social History Narrative  . None   Review of Systems - See HPI.  All other ROS are negative.  BP 131/81  Pulse 86  Temp(Src) 98.3 F (36.8 C) (Oral)  Resp 16  Ht 5' 5.25" (1.657 m)  Wt 196 lb 8 oz (89.132 kg)  BMI 32.46 kg/m2  SpO2 100%  Physical Exam  Vitals reviewed. Constitutional: He is oriented to person, place, and time and well-developed, well-nourished, and in no distress.  HENT:  Head: Normocephalic and atraumatic.  Eyes: Conjunctivae are normal. Pupils are equal, round, and  reactive to light.  Neck: Neck supple.  Cardiovascular: Normal rate, regular rhythm, normal heart sounds and intact distal pulses.   Pulmonary/Chest: Effort normal and breath sounds normal. No respiratory distress. He has no wheezes. He has no rales. He exhibits no tenderness.  Musculoskeletal:       Left elbow: Normal. No tenderness found.       Left wrist: Normal.       Right ankle: Normal. No tenderness.       Left hand: Decreased sensation noted. Decreased sensation is present in the ulnar distribution. Decreased sensation is not present in the medial redistribution and is not present in the radial distribution. Normal strength noted.       Right foot: He exhibits decreased range of motion, tenderness and swelling. He exhibits normal capillary  refill, no crepitus, no deformity and no laceration.  Erythema and swelling noted over MTP joint of R great toe with erythema extending peripherally.  Some warmth noted at MTP joint.  No evidence of laceration or trauma.  Neurological: He is alert and oriented to person, place, and time. No cranial nerve deficit. GCS score is 15.  Skin: Skin is warm and dry.  Psychiatric: Affect normal.    Recent Results (from the past 2160 hour(s))  C-REACTIVE PROTEIN     Status: None   Collection Time    04/18/13 11:41 AM      Result Value Ref Range   CRP 3.3  0.5 - 20.0 mg/dL  CBC WITH DIFFERENTIAL     Status: Abnormal   Collection Time    04/18/13 11:44 AM      Result Value Ref Range   WBC 7.9  4.5 - 10.5 K/uL   RBC 5.19  4.22 - 5.81 Mil/uL   Hemoglobin 15.4  13.0 - 17.0 g/dL   HCT 45.0  39.0 - 52.0 %   MCV 86.8  78.0 - 100.0 fl   MCHC 34.2  30.0 - 36.0 g/dL   RDW 14.8 (*) 11.5 - 14.6 %   Platelets 268.0  150.0 - 400.0 K/uL   Neutrophils Relative % 63.6  43.0 - 77.0 %   Lymphocytes Relative 22.1  12.0 - 46.0 %   Monocytes Relative 10.2  3.0 - 12.0 %   Eosinophils Relative 3.8  0.0 - 5.0 %   Basophils Relative 0.3  0.0 - 3.0 %   Neutro Abs 5.0  1.4 - 7.7 K/uL   Lymphs Abs 1.7  0.7 - 4.0 K/uL   Monocytes Absolute 0.8  0.1 - 1.0 K/uL   Eosinophils Absolute 0.3  0.0 - 0.7 K/uL   Basophils Absolute 0.0  0.0 - 0.1 K/uL  URINALYSIS, ROUTINE W REFLEX MICROSCOPIC     Status: Abnormal   Collection Time    04/18/13 11:44 AM      Result Value Ref Range   Color, Urine YELLOW  Yellow;Lt. Yellow   APPearance CLEAR  Clear   Specific Gravity, Urine >=1.030 (*) 1.000 - 1.030   pH 5.5  5.0 - 8.0   Total Protein, Urine NEGATIVE  Negative   Urine Glucose NEGATIVE  Negative   Ketones, ur NEGATIVE  Negative   Bilirubin Urine NEGATIVE  Negative   Hgb urine dipstick NEGATIVE  Negative   Urobilinogen, UA 0.2  0.0 - 1.0   Leukocytes, UA NEGATIVE  Negative   Nitrite NEGATIVE  Negative   WBC, UA 0-2/hpf   0-2/hpf   RBC / HPF none seen  0-2/hpf   Mucus, UA  Presence of (*) None   Squamous Epithelial / LPF Rare(0-4/hpf)  Rare(0-4/hpf)   Hyaline Casts, UA Presence of (*) None   WBC Casts, UA Presence of (*) None  TSH     Status: None   Collection Time    04/18/13 11:44 AM      Result Value Ref Range   TSH 3.82  0.35 - 5.50 uIU/mL  HEPATIC FUNCTION PANEL     Status: Abnormal   Collection Time    04/18/13 11:44 AM      Result Value Ref Range   Total Bilirubin 0.6  0.3 - 1.2 mg/dL   Bilirubin, Direct 0.1  0.0 - 0.3 mg/dL   Alkaline Phosphatase 126 (*) 39 - 117 U/L   AST 22  0 - 37 U/L   ALT 31  0 - 53 U/L   Total Protein 7.4  6.0 - 8.3 g/dL   Albumin 4.1  3.5 - 5.2 g/dL  BASIC METABOLIC PANEL     Status: Abnormal   Collection Time    04/18/13 11:44 AM      Result Value Ref Range   Sodium 141  135 - 145 mEq/L   Potassium 4.2  3.5 - 5.1 mEq/L   Chloride 105  96 - 112 mEq/L   CO2 27  19 - 32 mEq/L   Glucose, Bld 103 (*) 70 - 99 mg/dL   BUN 8  6 - 23 mg/dL   Creatinine, Ser 0.9  0.4 - 1.5 mg/dL   Calcium 9.2  8.4 - 10.5 mg/dL   GFR 131.25  >60.00 mL/min  LIPASE     Status: None   Collection Time    04/18/13 11:44 AM      Result Value Ref Range   Lipase 23.0  11.0 - 59.0 U/L  AMYLASE     Status: None   Collection Time    04/18/13 11:44 AM      Result Value Ref Range   Amylase 59  27 - 131 U/L  SEDIMENTATION RATE     Status: Abnormal   Collection Time    04/18/13 11:44 AM      Result Value Ref Range   Sed Rate 24 (*) 0 - 22 mm/hr    Assessment/Plan: Gout Symtpoms seem consistent with gout.  Will check uric acid level.  Rx Colchicine.  Patient to continue other measures as directed by ER physician.  Cubital tunnel syndrome Chronic.  Supportive measures discussed. Referral to Sports medicine placed.  Abnormal urinalysis Patient had labs drawn this AM by PCP.  Renal function within normal limits.  Urine micro reveals WBC and hyaline casts.  Patient asymptomatic. PE  unremarkable for CVA tenderness.  Vital signs WNL. Will repeat UA with Urine culture.

## 2013-04-18 NOTE — Addendum Note (Signed)
Addended by: Raiford Noble on: 04/18/2013 05:33 PM   Modules accepted: Orders

## 2013-04-18 NOTE — Assessment & Plan Note (Signed)
Patient had labs drawn this AM by PCP.  Renal function within normal limits.  Urine micro reveals WBC and hyaline casts.  Patient asymptomatic. PE unremarkable for CVA tenderness.  Vital signs WNL. Will repeat UA with Urine culture.

## 2013-04-18 NOTE — Telephone Encounter (Signed)
Bentyl 20 mg qid prn

## 2013-04-18 NOTE — Assessment & Plan Note (Signed)
Symtpoms seem consistent with gout.  Will check uric acid level.  Rx Colchicine.  Patient to continue other measures as directed by ER physician.

## 2013-04-20 ENCOUNTER — Other Ambulatory Visit: Payer: Medicare Other

## 2013-04-20 DIAGNOSIS — K219 Gastro-esophageal reflux disease without esophagitis: Secondary | ICD-10-CM

## 2013-04-20 DIAGNOSIS — R109 Unspecified abdominal pain: Secondary | ICD-10-CM

## 2013-04-20 LAB — CULTURE, URINE COMPREHENSIVE
COLONY COUNT: NO GROWTH
ORGANISM ID, BACTERIA: NO GROWTH

## 2013-04-21 LAB — GASTROINTESTINAL PATHOGEN PANEL PCR
C. DIFFICILE TOX A/B, PCR: NEGATIVE
CAMPYLOBACTER, PCR: NEGATIVE
Cryptosporidium, PCR: NEGATIVE
E coli (ETEC) LT/ST PCR: NEGATIVE
E coli (STEC) stx1/stx2, PCR: NEGATIVE
E coli 0157, PCR: NEGATIVE
Giardia lamblia, PCR: NEGATIVE
Norovirus, PCR: NEGATIVE
ROTAVIRUS, PCR: NEGATIVE
Salmonella, PCR: NEGATIVE
Shigella, PCR: NEGATIVE

## 2013-04-21 NOTE — ED Provider Notes (Signed)
CSN: [id number]     Arrival date & time 04/17/13  0222 History   First MD Initiated Contact with Patient 04/17/13 0355     Chief Complaint  Patient presents with  . Foot Pain     (Consider location/radiation/quality/duration/timing/severity/associated sxs/prior Treatment) Patient is a 33 y.o. male presenting with lower extremity pain. The history is provided by the patient.  Foot Pain This is a new problem. The current episode started more than 2 days ago. The problem occurs constantly. The problem has been gradually worsening. Pertinent negatives include no chest pain. The symptoms are aggravated by walking. He has tried acetaminophen and ASA for the symptoms. The treatment provided mild relief.    Past Medical History  Diagnosis Date  . Arthritis   . Depression   . GERD (gastroesophageal reflux disease)   . Hypertension   . Crohn's disease history of  . Bipolar affective   . Schizophrenic disorder   . Anxiety   . Chronic headaches    History reviewed. No pertinent past surgical history. Family History  Problem Relation Age of Onset  . Hypertension Mother   . Alcohol abuse Father   . Mental illness Father   . Arthritis Maternal Grandmother   . Hypertension Maternal Grandmother   . Heart disease Maternal Grandmother   . Hyperlipidemia Maternal Grandfather   . Stroke Paternal Grandmother   . Kidney disease Maternal Grandmother   . Diabetes Maternal Grandmother   . Cancer Maternal Grandmother     great, type unknown  . Colon polyps Maternal Grandfather   . Ulcerative colitis Maternal Aunt   . Multiple sclerosis Father   . Heart attack Mother    History  Substance Use Topics  . Smoking status: Current Every Day Smoker  . Smokeless tobacco: Never Used  . Alcohol Use: No    Review of Systems  Constitutional: Negative for fever and chills.  Cardiovascular: Negative for chest pain.  Musculoskeletal: Positive for arthralgias.  Allergic/Immunologic: Negative for  immunocompromised state.  Hematological: Does not bruise/bleed easily.      Allergies  Review of patient's allergies indicates no known allergies.  Home Medications   Current Outpatient Rx  Name  Route  Sig  Dispense  Refill  . gabapentin (NEURONTIN) 300 MG capsule   Oral   Take 600 mg by mouth 2 (two) times daily.         . cephALEXin (KEFLEX) 500 MG capsule   Oral   Take 1 capsule (500 mg total) by mouth 4 (four) times daily.   20 capsule   0   . dicyclomine (BENTYL) 20 MG tablet   Oral   Take 1 tablet (20 mg total) by mouth 4 (four) times daily -  before meals and at bedtime.   120 tablet   11   . HYDROcodone-acetaminophen (NORCO) 10-325 MG per tablet   Oral   Take 1 tablet by mouth every 8 (eight) hours as needed.   15 tablet   0   . ibuprofen (ADVIL,MOTRIN) 600 MG tablet   Oral   Take 1 tablet (600 mg total) by mouth every 6 (six) hours as needed.   30 tablet   0    BP 140/72  Pulse 78  Temp(Src) 98 F (36.7 C) (Oral)  Resp 18  SpO2 98% Physical Exam  Nursing note and vitals reviewed. Constitutional: He appears well-nourished.  Eyes: EOM are normal.  Neck: Neck supple.  Pulmonary/Chest: Effort normal.  Abdominal: There is no tenderness.  Neurological:  He is alert.  Skin:  Right foot with mild swelling ans erythema in the dorsal aspect.    ED Course  Procedures (including critical care time) Labs Review Labs Reviewed - No data to display Imaging Review No results found.   EKG Interpretation None      MDM   Final diagnoses:  Cellulitis of foot  Numbness of left hand    PT comes in with cc of foot pain. Exam shoes no evidecne of fracture, dislocation, and there is some eryhema and callor - so suspect possible cellulitis. No bursitis evidence. RICE advocated as well, along with antibiotics.  Varney Biles, MD 04/21/13 [id number]

## 2013-04-27 ENCOUNTER — Other Ambulatory Visit (INDEPENDENT_AMBULATORY_CARE_PROVIDER_SITE_OTHER): Payer: Medicare Other

## 2013-04-27 DIAGNOSIS — K219 Gastro-esophageal reflux disease without esophagitis: Secondary | ICD-10-CM

## 2013-04-27 DIAGNOSIS — R109 Unspecified abdominal pain: Secondary | ICD-10-CM

## 2013-04-27 LAB — HEMOCCULT SLIDES (X 3 CARDS)
Fecal Occult Blood: NEGATIVE
OCCULT 1: NEGATIVE
OCCULT 2: NEGATIVE
OCCULT 3: NEGATIVE
OCCULT 4: NEGATIVE
OCCULT 5: NEGATIVE

## 2013-04-28 ENCOUNTER — Telehealth: Payer: Self-pay | Admitting: Gastroenterology

## 2013-04-28 NOTE — Telephone Encounter (Signed)
Patient's wife notified that per OV note Dr. Fuller Plan wanted a 4 week f/u. She will call back to schedule.

## 2013-05-02 ENCOUNTER — Ambulatory Visit: Payer: Medicare Other | Admitting: Physician Assistant

## 2014-03-15 DIAGNOSIS — R110 Nausea: Secondary | ICD-10-CM | POA: Diagnosis not present

## 2014-03-15 DIAGNOSIS — R630 Anorexia: Secondary | ICD-10-CM | POA: Diagnosis not present

## 2014-03-15 DIAGNOSIS — R1084 Generalized abdominal pain: Secondary | ICD-10-CM | POA: Diagnosis not present

## 2014-10-18 ENCOUNTER — Emergency Department (HOSPITAL_COMMUNITY)
Admission: EM | Admit: 2014-10-18 | Discharge: 2014-10-19 | Disposition: A | Discharge status: Home / Self Care | Payer: Medicare Other | Source: Home / Self Care | Attending: Emergency Medicine | Admitting: Emergency Medicine

## 2014-10-18 ENCOUNTER — Emergency Department (HOSPITAL_COMMUNITY): Payer: Medicare Other

## 2014-10-18 ENCOUNTER — Encounter (HOSPITAL_COMMUNITY): Payer: Self-pay | Admitting: Neurology

## 2014-10-18 DIAGNOSIS — F319 Bipolar disorder, unspecified: Secondary | ICD-10-CM | POA: Insufficient documentation

## 2014-10-18 DIAGNOSIS — I10 Essential (primary) hypertension: Secondary | ICD-10-CM

## 2014-10-18 DIAGNOSIS — Z8249 Family history of ischemic heart disease and other diseases of the circulatory system: Secondary | ICD-10-CM | POA: Diagnosis not present

## 2014-10-18 DIAGNOSIS — Z792 Long term (current) use of antibiotics: Secondary | ICD-10-CM

## 2014-10-18 DIAGNOSIS — F419 Anxiety disorder, unspecified: Secondary | ICD-10-CM

## 2014-10-18 DIAGNOSIS — K5090 Crohn's disease, unspecified, without complications: Secondary | ICD-10-CM | POA: Diagnosis not present

## 2014-10-18 DIAGNOSIS — F329 Major depressive disorder, single episode, unspecified: Secondary | ICD-10-CM | POA: Diagnosis not present

## 2014-10-18 DIAGNOSIS — R112 Nausea with vomiting, unspecified: Secondary | ICD-10-CM | POA: Diagnosis not present

## 2014-10-18 DIAGNOSIS — G8929 Other chronic pain: Secondary | ICD-10-CM

## 2014-10-18 DIAGNOSIS — M1990 Unspecified osteoarthritis, unspecified site: Secondary | ICD-10-CM

## 2014-10-18 DIAGNOSIS — Z720 Tobacco use: Secondary | ICD-10-CM

## 2014-10-18 DIAGNOSIS — Z8261 Family history of arthritis: Secondary | ICD-10-CM | POA: Diagnosis not present

## 2014-10-18 DIAGNOSIS — K219 Gastro-esophageal reflux disease without esophagitis: Secondary | ICD-10-CM | POA: Diagnosis not present

## 2014-10-18 DIAGNOSIS — Z811 Family history of alcohol abuse and dependence: Secondary | ICD-10-CM | POA: Diagnosis not present

## 2014-10-18 DIAGNOSIS — R1030 Lower abdominal pain, unspecified: Secondary | ICD-10-CM | POA: Diagnosis not present

## 2014-10-18 DIAGNOSIS — Z79899 Other long term (current) drug therapy: Secondary | ICD-10-CM

## 2014-10-18 DIAGNOSIS — K5010 Crohn's disease of large intestine without complications: Secondary | ICD-10-CM | POA: Diagnosis not present

## 2014-10-18 DIAGNOSIS — K50919 Crohn's disease, unspecified, with unspecified complications: Secondary | ICD-10-CM

## 2014-10-18 LAB — COMPREHENSIVE METABOLIC PANEL
ALT: 20 U/L (ref 17–63)
AST: 23 U/L (ref 15–41)
Albumin: 4 g/dL (ref 3.5–5.0)
Alkaline Phosphatase: 113 U/L (ref 38–126)
Anion gap: 7 (ref 5–15)
BILIRUBIN TOTAL: 0.6 mg/dL (ref 0.3–1.2)
CO2: 27 mmol/L (ref 22–32)
Calcium: 9.7 mg/dL (ref 8.9–10.3)
Chloride: 105 mmol/L (ref 101–111)
Creatinine, Ser: 1.09 mg/dL (ref 0.61–1.24)
GFR calc Af Amer: >60 mL/min (ref >60)
GFR calc non Af Amer: >60 mL/min (ref >60)
Glucose, Bld: 110 mg/dL — ABNORMAL HIGH (ref 65–99)
Potassium: 4 mmol/L (ref 3.5–5.1)
Sodium: 139 mmol/L (ref 135–145)
Total Protein: 7.1 g/dL (ref 6.5–8.1)

## 2014-10-18 LAB — URINALYSIS, ROUTINE W REFLEX MICROSCOPIC
Bilirubin Urine: NEGATIVE
Glucose, UA: NEGATIVE mg/dL
Hgb urine dipstick: NEGATIVE
KETONES UR: NEGATIVE mg/dL
Leukocytes, UA: NEGATIVE
NITRITE: NEGATIVE
Protein, ur: NEGATIVE mg/dL
Specific Gravity, Urine: 1.015 (ref 1.005–1.030)
UROBILINOGEN UA: 0.2 mg/dL (ref 0.0–1.0)
pH: 6 (ref 5.0–8.0)

## 2014-10-18 LAB — CBC
HCT: 42.8 % (ref 39.0–52.0)
Hemoglobin: 15.1 g/dL (ref 13.0–17.0)
MCH: 29.5 pg (ref 26.0–34.0)
MCHC: 35.3 g/dL (ref 30.0–36.0)
MCV: 83.8 fL (ref 78.0–100.0)
Platelets: 279 10*3/uL (ref 150–400)
RBC: 5.11 MIL/uL (ref 4.22–5.81)
RDW: 14.3 % (ref 11.5–15.5)
WBC: 7.1 10*3/uL (ref 4.0–10.5)

## 2014-10-18 LAB — LIPASE, BLOOD: Lipase: 28 U/L (ref 22–51)

## 2014-10-18 MED ORDER — HYDROMORPHONE HCL 1 MG/ML IJ SOLN
0.5000 mg | Freq: Once | INTRAMUSCULAR | Status: AC
  Administered 2014-10-18: 0.5 mg via INTRAVENOUS
  Filled 2014-10-18: qty 1

## 2014-10-18 MED ORDER — IOHEXOL 300 MG/ML  SOLN: 100.0000 mL | Freq: Once | INTRAMUSCULAR | Status: DC | PRN

## 2014-10-18 MED ORDER — ONDANSETRON HCL 4 MG/2ML IJ SOLN
4.0000 mg | Freq: Once | INTRAMUSCULAR | Status: AC
  Administered 2014-10-18: 4 mg via INTRAVENOUS
  Filled 2014-10-18: qty 2

## 2014-10-18 MED ORDER — SODIUM CHLORIDE 0.9 % IV BOLUS (SEPSIS)
1000.0000 mL | Freq: Once | INTRAVENOUS | Status: AC
  Administered 2014-10-18: 1000 mL via INTRAVENOUS

## 2014-10-18 MED ORDER — IOHEXOL 300 MG/ML  SOLN: 25.0000 mL | Freq: Once | INTRAMUSCULAR | Status: DC | PRN

## 2014-10-18 MED ORDER — IOHEXOL 300 MG/ML  SOLN
100.0000 mL | Freq: Once | INTRAMUSCULAR | Status: AC | PRN
  Administered 2014-10-18: 100 mL via INTRAVENOUS

## 2014-10-18 NOTE — ED Provider Notes (Signed)
CSN: [id number]     Arrival date & time 10/18/14  1711 History   First MD Initiated Contact with Patient 10/18/14 2007     Chief Complaint  Patient presents with  . Crohn's Disease     (Consider location/radiation/quality/duration/timing/severity/associated sxs/prior Treatment) HPI Jeffery Mcintosh is a 34 y.o. male with history of arthritis, depression, hypertension, Crohn's disease, anxiety, presents to emergency department with complaint of abdominal pain. Patient states that he was diagnosed with Crohn's disease at age of 50. He states he has not had good follow-up in the last several years. He states he has not seen a primary care doctor or has not been followed up by the gastroenterologist in years. He states pain is diffuse. It is sharp. It comes and goes. He states that he has had on and off pain for several months, but states he normally is able to manage the pain with over-the-counter medications and it usually subsides. He states this time pain started 3 days ago and has been gradually getting worse. He reports nausea, no vomiting. He denies any fever, chills. He denies any diarrhea. No blood in his stool.  Past Medical History  Diagnosis Date  . Arthritis   . Depression   . GERD (gastroesophageal reflux disease)   . Hypertension   . Crohn's disease history of  . Bipolar affective   . Schizophrenic disorder   . Anxiety   . Chronic headaches    History reviewed. No pertinent past surgical history. Family History  Problem Relation Age of Onset  . Hypertension Mother   . Alcohol abuse Father   . Mental illness Father   . Arthritis Maternal Grandmother   . Hypertension Maternal Grandmother   . Heart disease Maternal Grandmother   . Hyperlipidemia Maternal Grandfather   . Stroke Paternal Grandmother   . Kidney disease Maternal Grandmother   . Diabetes Maternal Grandmother   . Cancer Maternal Grandmother     great, type unknown  . Colon polyps Maternal Grandfather   .  Ulcerative colitis Maternal Aunt   . Multiple sclerosis Father   . Heart attack Mother    Social History  Substance Use Topics  . Smoking status: Current Every Day Smoker  . Smokeless tobacco: Never Used  . Alcohol Use: No    Review of Systems  Constitutional: Negative for fever and chills.  Respiratory: Negative for cough, chest tightness and shortness of breath.   Cardiovascular: Negative for chest pain, palpitations and leg swelling.  Gastrointestinal: Positive for nausea and abdominal pain. Negative for vomiting, diarrhea and abdominal distention.  Genitourinary: Negative for dysuria, urgency, frequency and hematuria.  Musculoskeletal: Negative for myalgias, arthralgias, neck pain and neck stiffness.  Skin: Negative for rash.  Allergic/Immunologic: Negative for immunocompromised state.  Neurological: Negative for dizziness, weakness, light-headedness, numbness and headaches.      Allergies  Review of patient's allergies indicates no known allergies.  Home Medications   Prior to Admission medications   Medication Sig Start Date End Date Taking? Authorizing Provider  cephALEXin (KEFLEX) 500 MG capsule Take 1 capsule (500 mg total) by mouth 4 (four) times daily. 04/17/13   Varney Biles, MD  dicyclomine (BENTYL) 20 MG tablet Take 1 tablet (20 mg total) by mouth 4 (four) times daily -  before meals and at bedtime. 04/18/13   Ladene Artist, MD  gabapentin (NEURONTIN) 300 MG capsule Take 600 mg by mouth 2 (two) times daily.    Historical Provider, MD  HYDROcodone-acetaminophen (NORCO) 10-325 MG per tablet  Take 1 tablet by mouth every 8 (eight) hours as needed. 04/18/13   Brunetta Jeans, PA-C  ibuprofen (ADVIL,MOTRIN) 600 MG tablet Take 1 tablet (600 mg total) by mouth every 6 (six) hours as needed. 04/17/13   Ankit Kathrynn Humble, MD   BP 121/99 mmHg  Pulse 74  Temp(Src) 98.1 F (36.7 C) (Oral)  Resp 16  Ht 5' 6"  (1.676 m)  Wt 180 lb (81.647 kg)  BMI 29.07 kg/m2  SpO2  98% Physical Exam  Constitutional: He appears well-developed and well-nourished. No distress.  HENT:  Head: Normocephalic and atraumatic.  Eyes: Conjunctivae are normal.  Neck: Neck supple.  Cardiovascular: Normal rate, regular rhythm and normal heart sounds.   Pulmonary/Chest: Effort normal. No respiratory distress. He has no wheezes. He has no rales.  Abdominal: Soft. Bowel sounds are normal. He exhibits no distension. There is tenderness. There is no rebound and no guarding.  Diffuse tenderness, no guarding, no rebound tenderness.  Musculoskeletal: He exhibits no edema.  Neurological: He is alert.  Skin: Skin is warm and dry.  Nursing note and vitals reviewed.   ED Course  Procedures (including critical care time) Labs Review Labs Reviewed  COMPREHENSIVE METABOLIC PANEL - Abnormal; Notable for the following:    Glucose, Bld 110 (*)    BUN <5 (*)    All other components within normal limits  LIPASE, BLOOD  CBC  URINALYSIS, ROUTINE W REFLEX MICROSCOPIC (NOT AT [facility])    Imaging Review Ct Abdomen Pelvis W Contrast  10/19/2014   CLINICAL DATA:  Diffuse mid to lower abdominal pain radiating into testicles with nausea, vomiting and diarrhea 4 days. History of Crohn's disease.  EXAM: CT ABDOMEN AND PELVIS WITH CONTRAST  TECHNIQUE: Multidetector CT imaging of the abdomen and pelvis was performed using the standard protocol following bolus administration of intravenous contrast.  CONTRAST:  100 mL Omnipaque 300 IV  COMPARISON:  None.  FINDINGS: Lung bases are normal.  Abdominal images demonstrate focal fatty infiltration of the liver adjacent the gallbladder fossa. The spleen, pancreas, gallbladder and adrenal glands are normal. Kidneys are normal in size without hydronephrosis or nephrolithiasis. Ureters are normal.  There is mild wall thickening of the ascending colon/ cecum with moderate wall thickening of the terminal ileum and adjacent ileal loops. Findings are compatible patient's  known Crohn's disease. Minimal free fluid over the right lower quadrant. Several thick-walled small bowel loops in the right lower quadrant appear to be somewhat matted together as it would be difficult to exclude an enteroenteric fistula in this location. There is minimal wall thickening of the descending colon. The appendix appears within normal. Remainder of the small bowel and colon are unremarkable.  Vascular structures are within normal.  Pelvic images demonstrate the bladder, prostate and rectum to be within normal. There is no free fluid. Remaining bones and soft tissues are within normal.  IMPRESSION: Mild wall thickening of the descending colon as well as the cecum and ascending colon. Moderate wall thickening of the terminal ileum and adjacent ileal loops as these findings are compatible with active inflammation of patient's known Crohn's disease. Minimal associated free fluid. Several of the thick-walled ileal loops in the right lower quadrant appear to be matted together as it would be difficult to exclude an enteroenteric fistula in this location.  Focal fatty change over the liver adjacent the gallbladder fossa.   Electronically Signed   By: Marin Olp M.D.   On: 10/19/2014 00:03   I have personally reviewed and evaluated  these images and lab results as part of my medical decision-making.   EKG Interpretation None      MDM   Final diagnoses:  Exacerbation of Crohn's disease, unspecified complication    patient with history of Crohn's disease, has not seen a PCP or GI and multiple years, here with what appears to be possible Crohn's exacerbation. His vital signs are normal. He is afebrile. His abdomen is benign, diffusely tender. Will get labs, CT abdomen and pelvis for further evaluation.   1:00 AM Patient is feeling much better. He is currently asymptomatic. His lab work is normal. His CT scan is consistent with active Crohn's disease. There is question of possible enteroenteric  fistula, no other findings. Patient is not vomiting. Given pain is well-managed emergency department, we will discharge him home. Decadron 10 mg given IM in emergency department. Prednisone will be prescribed. I will give him referral to gastroenterologist. Advised to follow-up.  Filed Vitals:   10/18/14 2300 10/18/14 2345 10/18/14 2348 10/19/14 0058  BP: 119/87 129/75 125/79 123/80  Pulse: 78 72 74 65  Temp:      TempSrc:      Resp:   17 16  Height:      Weight:      SpO2: 97% 98% 100% 99%     Jeannett Senior, PA-C 10/19/14 0134  Nat Christen, MD 10/19/14 1212

## 2014-10-18 NOTE — ED Notes (Signed)
Since Monday has had flare up, pain in upper abd and lower abd, pain is shooting down to his testicles. No swelling of testicles, no urinary problems. Diarrhea x 4 today. No blood in stool. Nauseated no vomiting. Is a x 4

## 2014-10-18 NOTE — ED Notes (Signed)
MD at bedside. 

## 2014-10-19 MED ORDER — ONDANSETRON 8 MG PO TBDP: 8.0000 mg | ORAL_TABLET | Freq: Three times a day (TID) | ORAL | Status: DC | PRN

## 2014-10-19 MED ORDER — DEXAMETHASONE SODIUM PHOSPHATE 10 MG/ML IJ SOLN
10.0000 mg | Freq: Once | INTRAMUSCULAR | Status: AC
  Administered 2014-10-19: 10 mg via INTRAVENOUS
  Filled 2014-10-19: qty 1

## 2014-10-19 MED ORDER — HYDROCODONE-ACETAMINOPHEN 5-325 MG PO TABS: 1.0000 | ORAL_TABLET | ORAL | Status: DC | PRN

## 2014-10-19 MED ORDER — HYDROMORPHONE HCL 1 MG/ML IJ SOLN
0.5000 mg | Freq: Once | INTRAMUSCULAR | Status: AC
  Administered 2014-10-19: 0.5 mg via INTRAVENOUS
  Filled 2014-10-19: qty 1

## 2014-10-19 MED ORDER — PREDNISONE 50 MG PO TABS: 50.0000 mg | ORAL_TABLET | Freq: Every day | ORAL | Status: DC

## 2014-10-19 NOTE — ED Notes (Signed)
Tatyana, PA-C, at the bedside.

## 2014-10-19 NOTE — ED Notes (Signed)
Awaiting for follow up with provider in regards to CT results.

## 2014-10-19 NOTE — Discharge Instructions (Signed)
Take prednisone as prescribed until all gone. Take Norco as prescribed as needed for severe pain. Takes Zofran for nausea. Please follow with gastroenterologist as referred. Call them tomorrow for an appointment. Return if worsening symptoms.   Crohn Disease Crohn disease is a long-term (chronic) soreness and redness (inflammation) of the intestines (bowel). It can affect any portion of the digestive tract, from the mouth to the anus. It can also cause problems outside the digestive tract. Crohn disease is closely related to a disease called ulcerative colitis (together, these two diseases are called inflammatory bowel disease).  CAUSES  The cause of Crohn disease is not known. One Link Snuffer is that, in an easily affected person, the immune system is triggered to attack the body's own digestive tissue. Crohn disease runs in families. It seems to be more common in certain geographic areas and amongst certain races. There are no clear-cut dietary causes.  SYMPTOMS  Crohn disease can cause many different symptoms since it can affect many different parts of the body. Symptoms include:  Fatigue.  Weight loss.  Chronic diarrhea, sometime bloody.  Abdominal pain and cramps.  Fever.  Ulcers or canker sores in the mouth or rectum.  Anemia (low red blood cells).  Arthritis, skin problems, and eye problems may occur. Complications of Crohn disease can include:  Series of holes (perforation) of the bowel.  Portions of the intestines sticking to each other (adhesions).  Obstruction of the bowel.  Fistula formation, typically in the rectal area but also in other areas. A fistula is an opening between the bowels and the outside, or between the bowels and another organ.  A painful crack in the mucous membrane of the anus (rectal fissure). DIAGNOSIS  Your caregiver may suspect Crohn disease based on your symptoms and an exam. Blood tests may confirm that there is a problem. You may be asked to  submit a stool specimen for examination. X-rays and CT scans may be necessary. Ultimately, the diagnosis is usually made after a procedure that uses a flexible tube that is inserted via your mouth or your anus. This is done under sedation and is called either an upper endoscopy or colonoscopy. With these tests, the specialist can take tiny tissue samples and remove them from the inside of the bowel (biopsy). Examination of this biopsy tissue under a microscope can reveal Crohn disease as the cause of your symptoms. Due to the many different forms that Crohn disease can take, symptoms may be present for several years before a diagnosis is made. TREATMENT  Medications are often used to decrease inflammation and control the immune system. These include medicines related to aspirin, steroid medications, and newer and stronger medications to slow down the immune system. Some medications may be used as suppositories or enemas. A number of other medications are used or have been studied. Your caregiver will make specific recommendations. HOME CARE INSTRUCTIONS   Symptoms such as diarrhea can be controlled with medications. Avoid foods that have a laxative effect such as fresh fruit, vegetables, and dairy products. During flare-ups, you can rest your bowel by refraining from solid foods. Drink clear liquids frequently during the day. (Electrolyte or rehydrating fluids are best. Your caregiver can help you with suggestions.) Drink often to prevent loss of body fluids (dehydration). When diarrhea has cleared, eat small meals and more frequently. Avoid food additives and stimulants such as caffeine (coffee, tea, or chocolate). Enzyme supplements may help if you develop intolerance to a sugar in dairy products (lactose). Ask  your caregiver or dietitian about specific dietary instructions.  Try to maintain a positive attitude. Learn relaxation techniques such as self-hypnosis, mental imaging, and muscle relaxation.  If  possible, avoid stresses which can aggravate your condition.  Exercise regularly.  Follow your diet.  Always get plenty of rest. SEEK MEDICAL CARE IF:   Your symptoms fail to improve after a week or two of new treatment.  You experience continued weight loss.  You have ongoing cramps or loose bowels.  You develop a new skin rash, skin sores, or eye problems. SEEK IMMEDIATE MEDICAL CARE IF:   You have worsening of your symptoms or develop new symptoms.  You have a fever.  You develop bloody diarrhea.  You develop severe abdominal pain. MAKE SURE YOU:   Understand these instructions.  Will watch your condition.  Will get help right away if you are not doing well or get worse. Document Released: 10/30/2004 Document Revised: 06/06/2013 Document Reviewed: 09/28/2006 [facility] Patient Information 2015 [place], [place]. This information is not intended to replace advice given to you by your health care provider. Make sure you discuss any questions you have with your health care provider.

## 2014-10-19 NOTE — ED Notes (Signed)
Reported request for pain meds to tatyana, pa-C. She gives verbal order for repeat 0.3m of dilaudid.

## 2014-10-20 DIAGNOSIS — Z841 Family history of disorders of kidney and ureter: Secondary | ICD-10-CM

## 2014-10-20 DIAGNOSIS — Z811 Family history of alcohol abuse and dependence: Secondary | ICD-10-CM

## 2014-10-20 DIAGNOSIS — Z820 Family history of epilepsy and other diseases of the nervous system: Secondary | ICD-10-CM

## 2014-10-20 DIAGNOSIS — Z8371 Family history of colonic polyps: Secondary | ICD-10-CM

## 2014-10-20 DIAGNOSIS — Z833 Family history of diabetes mellitus: Secondary | ICD-10-CM

## 2014-10-20 DIAGNOSIS — F329 Major depressive disorder, single episode, unspecified: Secondary | ICD-10-CM | POA: Diagnosis present

## 2014-10-20 DIAGNOSIS — K5090 Crohn's disease, unspecified, without complications: Principal | ICD-10-CM | POA: Diagnosis present

## 2014-10-20 DIAGNOSIS — K219 Gastro-esophageal reflux disease without esophagitis: Secondary | ICD-10-CM | POA: Diagnosis present

## 2014-10-20 DIAGNOSIS — Z8261 Family history of arthritis: Secondary | ICD-10-CM

## 2014-10-20 DIAGNOSIS — Z823 Family history of stroke: Secondary | ICD-10-CM

## 2014-10-20 DIAGNOSIS — Z8249 Family history of ischemic heart disease and other diseases of the circulatory system: Secondary | ICD-10-CM

## 2014-10-20 DIAGNOSIS — Z809 Family history of malignant neoplasm, unspecified: Secondary | ICD-10-CM

## 2014-10-20 DIAGNOSIS — Z818 Family history of other mental and behavioral disorders: Secondary | ICD-10-CM

## 2014-10-20 DIAGNOSIS — Z8349 Family history of other endocrine, nutritional and metabolic diseases: Secondary | ICD-10-CM

## 2014-10-21 ENCOUNTER — Inpatient Hospital Stay (HOSPITAL_COMMUNITY): Payer: Medicare Other

## 2014-10-21 ENCOUNTER — Encounter (HOSPITAL_COMMUNITY): Payer: Self-pay | Admitting: *Deleted

## 2014-10-21 ENCOUNTER — Inpatient Hospital Stay (HOSPITAL_COMMUNITY)
Admission: EM | Admit: 2014-10-21 | Discharge: 2014-10-23 | DRG: 387 | Disposition: A | Discharge status: Home / Self Care | Payer: Medicare Other | Attending: Internal Medicine | Admitting: Internal Medicine

## 2014-10-21 DIAGNOSIS — Z8349 Family history of other endocrine, nutritional and metabolic diseases: Secondary | ICD-10-CM | POA: Diagnosis not present

## 2014-10-21 DIAGNOSIS — R1084 Generalized abdominal pain: Secondary | ICD-10-CM

## 2014-10-21 DIAGNOSIS — Z818 Family history of other mental and behavioral disorders: Secondary | ICD-10-CM | POA: Diagnosis not present

## 2014-10-21 DIAGNOSIS — R197 Diarrhea, unspecified: Secondary | ICD-10-CM | POA: Diagnosis present

## 2014-10-21 DIAGNOSIS — Z823 Family history of stroke: Secondary | ICD-10-CM | POA: Diagnosis not present

## 2014-10-21 DIAGNOSIS — Z8261 Family history of arthritis: Secondary | ICD-10-CM | POA: Diagnosis not present

## 2014-10-21 DIAGNOSIS — R109 Unspecified abdominal pain: Secondary | ICD-10-CM

## 2014-10-21 DIAGNOSIS — Z811 Family history of alcohol abuse and dependence: Secondary | ICD-10-CM | POA: Diagnosis not present

## 2014-10-21 DIAGNOSIS — Z809 Family history of malignant neoplasm, unspecified: Secondary | ICD-10-CM | POA: Diagnosis not present

## 2014-10-21 DIAGNOSIS — F329 Major depressive disorder, single episode, unspecified: Secondary | ICD-10-CM | POA: Diagnosis present

## 2014-10-21 DIAGNOSIS — K5090 Crohn's disease, unspecified, without complications: Secondary | ICD-10-CM | POA: Diagnosis present

## 2014-10-21 DIAGNOSIS — R1010 Upper abdominal pain, unspecified: Secondary | ICD-10-CM | POA: Diagnosis not present

## 2014-10-21 DIAGNOSIS — Z820 Family history of epilepsy and other diseases of the nervous system: Secondary | ICD-10-CM | POA: Diagnosis not present

## 2014-10-21 DIAGNOSIS — Z8371 Family history of colonic polyps: Secondary | ICD-10-CM | POA: Diagnosis not present

## 2014-10-21 DIAGNOSIS — K50918 Crohn's disease, unspecified, with other complication: Secondary | ICD-10-CM | POA: Diagnosis not present

## 2014-10-21 DIAGNOSIS — Z8249 Family history of ischemic heart disease and other diseases of the circulatory system: Secondary | ICD-10-CM | POA: Diagnosis not present

## 2014-10-21 DIAGNOSIS — Z833 Family history of diabetes mellitus: Secondary | ICD-10-CM | POA: Diagnosis not present

## 2014-10-21 DIAGNOSIS — Z841 Family history of disorders of kidney and ureter: Secondary | ICD-10-CM | POA: Diagnosis not present

## 2014-10-21 DIAGNOSIS — K219 Gastro-esophageal reflux disease without esophagitis: Secondary | ICD-10-CM | POA: Diagnosis present

## 2014-10-21 LAB — CBC WITH DIFFERENTIAL/PLATELET
BASOS ABS: 0 10*3/uL (ref 0.0–0.1)
BASOS ABS: 0 10*3/uL (ref 0.0–0.1)
Basophils Relative: 0 %
Basophils Relative: 0 %
EOS ABS: 0 10*3/uL (ref 0.0–0.7)
EOS PCT: 0 %
EOS PCT: 0 %
Eosinophils Absolute: 0 10*3/uL (ref 0.0–0.7)
HCT: 40.3 % (ref 39.0–52.0)
HEMATOCRIT: 39.2 % (ref 39.0–52.0)
Hemoglobin: 13.9 g/dL (ref 13.0–17.0)
Hemoglobin: 14 g/dL (ref 13.0–17.0)
LYMPHS PCT: 13 %
Lymphocytes Relative: 6 %
Lymphs Abs: 0.8 10*3/uL (ref 0.7–4.0)
Lymphs Abs: 1.6 10*3/uL (ref 0.7–4.0)
MCH: 29.2 pg (ref 26.0–34.0)
MCH: 29.8 pg (ref 26.0–34.0)
MCHC: 34.7 g/dL (ref 30.0–36.0)
MCHC: 35.5 g/dL (ref 30.0–36.0)
MCV: 83.9 fL (ref 78.0–100.0)
MCV: 84 fL (ref 78.0–100.0)
MONO ABS: 1.1 10*3/uL — AB (ref 0.1–1.0)
Monocytes Absolute: 0.2 10*3/uL (ref 0.1–1.0)
Monocytes Relative: 1 %
Monocytes Relative: 8 %
NEUTROS ABS: 10.2 10*3/uL — AB (ref 1.7–7.7)
Neutro Abs: 12.2 10*3/uL — ABNORMAL HIGH (ref 1.7–7.7)
Neutrophils Relative %: 79 %
Neutrophils Relative %: 93 %
PLATELETS: 273 10*3/uL (ref 150–400)
PLATELETS: 287 10*3/uL (ref 150–400)
RBC: 4.67 MIL/uL (ref 4.22–5.81)
RBC: 4.8 MIL/uL (ref 4.22–5.81)
RDW: 14.1 % (ref 11.5–15.5)
RDW: 14.2 % (ref 11.5–15.5)
WBC: 12.9 10*3/uL — ABNORMAL HIGH (ref 4.0–10.5)
WBC: 13.2 10*3/uL — AB (ref 4.0–10.5)

## 2014-10-21 LAB — COMPREHENSIVE METABOLIC PANEL
ALBUMIN: 4 g/dL (ref 3.5–5.0)
ALT: 20 U/L (ref 17–63)
ALT: 21 U/L (ref 17–63)
AST: 22 U/L (ref 15–41)
AST: 25 U/L (ref 15–41)
Albumin: 3.9 g/dL (ref 3.5–5.0)
Alkaline Phosphatase: 86 U/L (ref 38–126)
Alkaline Phosphatase: 89 U/L (ref 38–126)
Anion gap: 10 (ref 5–15)
Anion gap: 9 (ref 5–15)
BILIRUBIN TOTAL: 0.4 mg/dL (ref 0.3–1.2)
BILIRUBIN TOTAL: 0.5 mg/dL (ref 0.3–1.2)
BUN: 7 mg/dL (ref 6–20)
BUN: 8 mg/dL (ref 6–20)
CO2: 27 mmol/L (ref 22–32)
CO2: 29 mmol/L (ref 22–32)
CREATININE: 0.96 mg/dL (ref 0.61–1.24)
CREATININE: 0.97 mg/dL (ref 0.61–1.24)
Calcium: 9.1 mg/dL (ref 8.9–10.3)
Calcium: 9.6 mg/dL (ref 8.9–10.3)
Chloride: 100 mmol/L — ABNORMAL LOW (ref 101–111)
Chloride: 103 mmol/L (ref 101–111)
GFR calc Af Amer: >60 mL/min (ref >60)
GFR calc Af Amer: >60 mL/min (ref >60)
GLUCOSE: 105 mg/dL — AB (ref 65–99)
Glucose, Bld: 150 mg/dL — ABNORMAL HIGH (ref 65–99)
POTASSIUM: 4.3 mmol/L (ref 3.5–5.1)
Potassium: 3.8 mmol/L (ref 3.5–5.1)
Sodium: 139 mmol/L (ref 135–145)
Sodium: 139 mmol/L (ref 135–145)
TOTAL PROTEIN: 7.2 g/dL (ref 6.5–8.1)
TOTAL PROTEIN: 7.7 g/dL (ref 6.5–8.1)

## 2014-10-21 LAB — SEDIMENTATION RATE: SED RATE: 4 mm/h (ref 0–16)

## 2014-10-21 LAB — URINALYSIS, ROUTINE W REFLEX MICROSCOPIC
Bilirubin Urine: NEGATIVE
GLUCOSE, UA: NEGATIVE mg/dL
HGB URINE DIPSTICK: NEGATIVE
KETONES UR: NEGATIVE mg/dL
Leukocytes, UA: NEGATIVE
Nitrite: NEGATIVE
PH: 6.5 (ref 5.0–8.0)
PROTEIN: NEGATIVE mg/dL
Specific Gravity, Urine: 1.019 (ref 1.005–1.030)
Urobilinogen, UA: 0.2 mg/dL (ref 0.0–1.0)

## 2014-10-21 LAB — C-REACTIVE PROTEIN

## 2014-10-21 LAB — LIPASE, BLOOD: LIPASE: 22 U/L (ref 22–51)

## 2014-10-21 MED ORDER — METHYLPREDNISOLONE SODIUM SUCC 125 MG IJ SOLR
125.0000 mg | Freq: Once | INTRAMUSCULAR | Status: AC
  Administered 2014-10-21: 125 mg via INTRAVENOUS
  Filled 2014-10-21: qty 2

## 2014-10-21 MED ORDER — DEXTROSE-NACL 5-0.9 % IV SOLN
INTRAVENOUS | Status: AC
  Administered 2014-10-21 – 2014-10-22 (×3): via INTRAVENOUS

## 2014-10-21 MED ORDER — HYDROMORPHONE HCL 1 MG/ML IJ SOLN
2.0000 mg | INTRAMUSCULAR | Status: DC | PRN
  Administered 2014-10-21: 2 mg via INTRAVENOUS
  Filled 2014-10-21: qty 2

## 2014-10-21 MED ORDER — ONDANSETRON HCL 4 MG/2ML IJ SOLN
4.0000 mg | Freq: Once | INTRAMUSCULAR | Status: AC
  Administered 2014-10-21: 4 mg via INTRAVENOUS
  Filled 2014-10-21: qty 2

## 2014-10-21 MED ORDER — INFLUENZA VAC SPLIT QUAD 0.5 ML IM SUSY
0.5000 mL | PREFILLED_SYRINGE | INTRAMUSCULAR | Status: AC | PRN
  Administered 2014-10-23: 0.5 mL via INTRAMUSCULAR
  Filled 2014-10-21: qty 0.5

## 2014-10-21 MED ORDER — ACETAMINOPHEN 650 MG RE SUPP: 650.0000 mg | Freq: Four times a day (QID) | RECTAL | Status: DC | PRN

## 2014-10-21 MED ORDER — ONDANSETRON HCL 4 MG/2ML IJ SOLN: 4.0000 mg | Freq: Three times a day (TID) | INTRAMUSCULAR | Status: DC | PRN

## 2014-10-21 MED ORDER — SODIUM CHLORIDE 0.9 % IV BOLUS (SEPSIS)
1000.0000 mL | Freq: Once | INTRAVENOUS | Status: AC
  Administered 2014-10-21: 1000 mL via INTRAVENOUS

## 2014-10-21 MED ORDER — HYDROMORPHONE HCL 1 MG/ML IJ SOLN
1.0000 mg | INTRAMUSCULAR | Status: DC | PRN
  Administered 2014-10-21 – 2014-10-23 (×13): 1 mg via INTRAVENOUS
  Filled 2014-10-21 (×13): qty 1

## 2014-10-21 MED ORDER — ACETAMINOPHEN 325 MG PO TABS: 650.0000 mg | ORAL_TABLET | Freq: Four times a day (QID) | ORAL | Status: DC | PRN

## 2014-10-21 MED ORDER — DICYCLOMINE HCL 10 MG PO CAPS: 10.0000 mg | ORAL_CAPSULE | Freq: Four times a day (QID) | ORAL | Status: DC | PRN

## 2014-10-21 MED ORDER — GI COCKTAIL ~~LOC~~
30.0000 mL | Freq: Once | ORAL | Status: AC
  Administered 2014-10-21: 30 mL via ORAL
  Filled 2014-10-21: qty 30

## 2014-10-21 MED ORDER — SODIUM CHLORIDE 0.9 % IV SOLN
INTRAVENOUS | Status: DC
  Administered 2014-10-21: 03:00:00 via INTRAVENOUS

## 2014-10-21 MED ORDER — TRAZODONE HCL 50 MG PO TABS
50.0000 mg | ORAL_TABLET | Freq: Every evening | ORAL | Status: DC | PRN
  Administered 2014-10-21 – 2014-10-22 (×2): 50 mg via ORAL
  Filled 2014-10-21 (×2): qty 1

## 2014-10-21 MED ORDER — HYDROMORPHONE HCL 1 MG/ML IJ SOLN
1.0000 mg | Freq: Once | INTRAMUSCULAR | Status: AC
  Administered 2014-10-21: 1 mg via INTRAVENOUS
  Filled 2014-10-21: qty 1

## 2014-10-21 MED ORDER — PANTOPRAZOLE SODIUM 40 MG IV SOLR
40.0000 mg | Freq: Two times a day (BID) | INTRAVENOUS | Status: DC
  Administered 2014-10-21 – 2014-10-22 (×4): 40 mg via INTRAVENOUS
  Filled 2014-10-21 (×4): qty 40

## 2014-10-21 MED ORDER — HYDROMORPHONE HCL 1 MG/ML IJ SOLN
1.0000 mg | INTRAMUSCULAR | Status: DC | PRN
  Administered 2014-10-21: 1 mg via INTRAVENOUS
  Filled 2014-10-21: qty 1

## 2014-10-21 MED ORDER — ENOXAPARIN SODIUM 40 MG/0.4ML ~~LOC~~ SOLN
40.0000 mg | SUBCUTANEOUS | Status: DC
  Administered 2014-10-21 – 2014-10-23 (×3): 40 mg via SUBCUTANEOUS
  Filled 2014-10-21 (×3): qty 0.4

## 2014-10-21 MED ORDER — ONDANSETRON HCL 4 MG/2ML IJ SOLN: 4.0000 mg | Freq: Four times a day (QID) | INTRAMUSCULAR | Status: DC | PRN

## 2014-10-21 MED ORDER — METHYLPREDNISOLONE SODIUM SUCC 125 MG IJ SOLR
60.0000 mg | Freq: Every day | INTRAMUSCULAR | Status: DC
  Administered 2014-10-21 – 2014-10-22 (×2): 60 mg via INTRAVENOUS
  Filled 2014-10-21 (×2): qty 2

## 2014-10-21 MED ORDER — METHYLPREDNISOLONE SODIUM SUCC 40 MG IJ SOLR: 40.0000 mg | Freq: Every day | INTRAMUSCULAR | Status: DC

## 2014-10-21 MED ORDER — ONDANSETRON HCL 4 MG PO TABS: 4.0000 mg | ORAL_TABLET | Freq: Four times a day (QID) | ORAL | Status: DC | PRN

## 2014-10-21 MED ORDER — PNEUMOCOCCAL VAC POLYVALENT 25 MCG/0.5ML IJ INJ
0.5000 mL | INJECTION | INTRAMUSCULAR | Status: AC | PRN
Start: 2014-10-21 — End: 2014-10-23
  Administered 2014-10-23: 0.5 mL via INTRAMUSCULAR

## 2014-10-21 NOTE — Consult Note (Signed)
Referring Jeffery Mcintosh: [facility] Primary Care Physician:  No PCP Per Patient Primary Gastroenterologist:  Dr.Stark  Reason for Consultation:   Crohns flare     HPI: Jeffery Mcintosh is a 34 y.o. male who reports that he was diagnosed with Crohn's disease at age 78 in [place], [place]. He states he was hospitalized 2 or 3 different times. He was treated with prednisone and another medication of which he does not remember the name. He states he was on medication regularly for 3 or 4 years and then was feeling well he discontinued all his meds. He then relocated from [place] at age 64 or 12 and has never been seen or treated for Crohn's disease. Over the years he has had intermittent bouts of crampy abdominal pain associated with nausea, vomiting, and abdominal distention that would come and go. He thought it was due to his Crohn's and so he would back off on his  diet until the episode passed.Marland Kitchen He presented to the emergency room on Wednesday the 14th with a several day history of abdominal pain, diarrhea, and nausea. For the first couple of days he vomited several times, but his vomiting has subsided. He continues to have crampy abdominal pain. He states the pain is along the belt line and in the right lower quadrant. He states on Monday he vomited and the vomitus had red streaks in the but he was not sure if it was blood or from something he had had to drink. He has been having diarrhea 5 or 6 times per day for the past 1-1/2-2 weeks. Diarrhea is watery in consistency and moderate in volume. He has had no mucus with his stools but he does have some nocturnal stooling. He has urgency with his bowel movements and states he almost had an accident at the bus station on his way here. He was evaluated in the emergency room on the 14th and had a CT of the abdomen and pelvis revealed mild wall thickening of the descending colon as well as the cecum and ascending colon. Moderate wall thickening of the  terminal ileum and adjacent ileal loops as these findings are compatible with active inflammation in patient with known Crohn's disease. Several of the thick walled ileal loops in the right lower quadrant appear to be matted together and it would be difficult to exclude an enteral enteric fistula in this location. Prior to the onset of his diarrhea, he denies any and a biotic therapy, travel outside of the country, or acquisition of new pets. He denies any oral ulcers, eye pain or unusual skin rashes. He does admit to the development of "bumps, near his rectum and groin. His wife states some of the bumps near his rectum have drained purulent fluid in the past for about a week on and off. He has not had any fever or night sweats but recently has been having chills. He does have a history of arthritis and had been using ibuprofen but discontinued it because it was hurting his stomach. He states he called the GI office to be seen when he was discharged from the emergency room but was told there were no openings until November, and so he returned to the emergency room. Other past history includes depression, bipolar affective disorder, schizophrenic disorder, anxiety, and headaches. Chart review shows patient was evaluated by Dr. Fuller Plan in March 2015. An attempt was made to obtain records from his physician and hospital in [place]. He received notification from [facility] in [place]. They did not have any records as [place] walk only requires them to keep her records for 10 years in this situation.   Past Medical History  Diagnosis Date  . Arthritis   . Depression   . GERD (gastroesophageal reflux disease)   . Hypertension   . Crohn's disease history of  . Bipolar affective   . Schizophrenic disorder   . Anxiety   . Chronic headaches     History reviewed. No pertinent past surgical history.  Prior to Admission medications   Medication Sig Start Date End Date Taking?  Authorizing [name]  HYDROcodone-acetaminophen (NORCO) 5-325 MG per tablet Take 1 tablet by mouth every 4 (four) hours as needed for moderate pain. 10/19/14  Yes [name], PA-C  MELATONIN PO Take 1 tablet by mouth at bedtime as needed (for sleep).   Yes Historical [name], MD  ondansetron (ZOFRAN ODT) 8 MG disintegrating tablet Take 1 tablet (8 mg total) by mouth every 8 (eight) hours as needed for nausea or vomiting. 10/19/14  Yes [name], PA-C  predniSONE (DELTASONE) 50 MG tablet Take 1 tablet (50 mg total) by mouth daily. 10/19/14  Yes [name], PA-C  traZODone (DESYREL) 50 MG tablet Take 50 mg by mouth at bedtime as needed for sleep.   Yes Historical [name], MD    Current Facility-Administered Medications  Medication Dose Route Frequency [name] Last Rate Last Dose  . acetaminophen (TYLENOL) tablet 650 mg  650 mg Oral Q6H PRN [name], MD       Or  . acetaminophen (TYLENOL) suppository 650 mg  650 mg Rectal Q6H PRN [name], MD      . dextrose 5 %-0.9 % sodium chloride infusion   Intravenous Continuous [name], MD 100 mL/hr at 10/21/14 0600    . enoxaparin (LOVENOX) injection 40 mg  40 mg Subcutaneous Q24H [name], MD   40 mg at 10/21/14 1020  . HYDROmorphone (DILAUDID) injection 1 mg  1 mg Intravenous Q3H PRN [name], MD   1 mg at 10/21/14 1025  . Influenza vac split quadrivalent PF (FLUARIX) injection 0.5 mL  0.5 mL Intramuscular Prior to discharge [name], MD      . methylPREDNISolone sodium succinate (SOLU-MEDROL) 125 mg/2 mL injection 60 mg  60 mg Intravenous Daily Ripudeep K Rai, MD   60 mg at 10/21/14 1020  . ondansetron (ZOFRAN) tablet 4 mg  4 mg Oral Q6H PRN [name], MD       Or  . ondansetron [facility]) injection 4 mg  4 mg Intravenous Q6H PRN [name], MD      . pantoprazole (PROTONIX) injection 40 mg  40 mg Intravenous Q12H Ripudeep Krystal Eaton, MD   40 mg at  10/21/14 0827  . pneumococcal 23 valent vaccine (PNU-IMMUNE) injection 0.5 mL  0.5 mL Intramuscular Prior to discharge [name], MD      . traZODone (DESYREL) tablet 50 mg  50 mg Oral QHS PRN [name], MD        Allergies as of 10/20/2014  . (No Known Allergies)    Family History  Problem Relation Age of Onset  . Hypertension Mother   . Alcohol abuse Father   . Mental illness Father   . Arthritis Maternal Grandmother   . Hypertension Maternal Grandmother   . Heart disease Maternal Grandmother   . Hyperlipidemia Maternal Grandfather   . Stroke Paternal  Grandmother   . Kidney disease Maternal Grandmother   . Diabetes Maternal Grandmother   . Cancer Maternal Grandmother     great, type unknown  . Colon polyps Maternal Grandfather   . Ulcerative colitis Maternal Aunt   . Multiple sclerosis Father   . Heart attack Mother     Social History   Social History  . Marital Status: Single    Spouse Name: N/A  . Number of Children: 2  . Years of Education: N/A   Occupational History  . disabled    Social History Main Topics  . Smoking status: Current Every Day Smoker  . Smokeless tobacco: Never Used  . Alcohol Use: No  . Drug Use: No  . Sexual Activity: Not on file   Other Topics Concern  . Not on file   Social History Narrative    Review of Systems: Gen: Denies any fever, sweats, anorexia, fatigue, weakness, malaise, weight loss, and sleep disorder CV: Denies chest pain, angina, palpitations, syncope, orthopnea, PND, peripheral edema, and claudication. Resp: Denies dyspnea at rest, dyspnea with exercise, cough, sputum, wheezing, coughing up blood, and pleurisy. GI: Admits to nausea, vomiting, diarrhea GU : Denies urinary burning, blood in urine, urinary frequency, urinary hesitancy, nocturnal urination, and urinary incontinence. MS: Has intermittent joint pain and stiffness.  Derm: Denies rash, itching, dry skin, hives, moles, warts, or unhealing  ulcers.  Psych: Admits to history of anxiety and depression. Heme: Denies bruising, bleeding, and enlarged lymph nodes. Neuro:  Denies anydizziness, paresthesias. Has a history of migraine headaches. Endo:  Denies any problems with DM, thyroid, adrenal function.  Physical Exam: Vital signs in last 24 hours: Temp:  [98.3 F (36.8 C)-98.6 F (37 C)] 98.6 F (37 C) (09/17 0310) Pulse Rate:  [67-85] 82 (09/17 0310) Resp:  [16-18] 18 (09/17 0310) BP: (125-149)/(64-86) 142/77 mmHg (09/17 0310) SpO2:  [95 %-100 %] 95 % (09/17 0310) Weight:  [171 lb (77.565 kg)] 171 lb (77.565 kg) (09/17 0310) Last BM Date: 10/21/14 General:   Alert,  Well-developed, well-nourished, pleasant and cooperative in NAD Head:  Normocephalic and atraumatic. Eyes:  Sclera clear, no icterus  Conjunctiva pink. Ears:  Normal auditory acuity. Nose:  No deformity, discharge,  or lesions. Mouth:  No deformity or lesions.   Neck:  Supple; no masses or thyromegaly. Lungs:  Clear throughout to auscultation.   No wheezes, crackles, or rhonchi . Heart:  Regular rate and rhythm; no murmurs, clicks, rubs,  or gallops. Abdomen:  Soft, mild right sided and lower abdominal tenderness to palpation with no rebound or guarding BS active,nonpalp mass or hsm.   Rectal:  No fistula noted Msk:  Symmetrical without gross deformities. . Pulses:  Normal pulses noted. Extremities: Without clubbing or edema. Neurologic:  Alert and  oriented x4;  grossly normal neurologically. Skin:  Intact without significant lesions or rashes.. Psych:  Alert and cooperative. Normal mood and affect.  Intake/Output from previous day: 09/16 0701 - 09/17 0700 In: 120 [I.V.:120] Out: -  Intake/Output this shift:    Lab Results:  Recent Labs  10/18/14 1802 10/21/14 0016 10/21/14 0918  WBC 7.1 12.9* 13.2*  HGB 15.1 13.9 14.0  HCT 42.8 39.2 40.3  PLT 279 273 287   BMET  Recent Labs  10/18/14 1802 10/21/14 0016 10/21/14 0918  NA 139 139  139  K 4.0 3.8 4.3  CL 105 100* 103  CO2 27 29 27   GLUCOSE 110* 105* 150*  BUN <5* 8 7  CREATININE 1.09  0.97 0.96  CALCIUM 9.7 9.6 9.1   LFT  Recent Labs  10/21/14 0918  PROT 7.7  ALBUMIN 3.9  AST 25  ALT 20  ALKPHOS 86  BILITOT 0.4     Studies/Results: Dg Abd Acute W/chest  10/21/2014   CLINICAL DATA:  Per patient upper abdominal pressure and sharp pain in the lower part of his abdomen since Monday. Patient states that he has been vomiting and had diarrhea since Monday.  EXAM: DG ABDOMEN ACUTE W/ 1V CHEST  COMPARISON:  None.  FINDINGS: There is no evidence of dilated bowel loops or free intraperitoneal air. No radiopaque calculi or other significant radiographic abnormality is seen. Heart size and mediastinal contours are within normal limits. Both lungs are clear.  IMPRESSION: Negative abdominal radiographs.  No acute cardiopulmonary disease.   Electronically Signed   By: Kathreen Devoid   On: 10/21/2014 10:38   Abdomen Pelvis W Contrast     Study Result     CLINICAL DATA: Diffuse mid to lower abdominal pain radiating into testicles with nausea, vomiting and diarrhea 4 days. History of Crohn's disease.  EXAM: CT ABDOMEN AND PELVIS WITH CONTRAST  TECHNIQUE: Multidetector CT imaging of the abdomen and pelvis was performed using the standard protocol following bolus administration of intravenous contrast.  CONTRAST: 100 mL Omnipaque 300 IV  COMPARISON: None.  FINDINGS: Lung bases are normal.  Abdominal images demonstrate focal fatty infiltration of the liver adjacent the gallbladder fossa. The spleen, pancreas, gallbladder and adrenal glands are normal. Kidneys are normal in size without hydronephrosis or nephrolithiasis. Ureters are normal.  There is mild wall thickening of the ascending colon/ cecum with moderate wall thickening of the terminal ileum and adjacent ileal loops. Findings are compatible patient's known Crohn's disease. Minimal free  fluid over the right lower quadrant. Several thick-walled small bowel loops in the right lower quadrant appear to be somewhat matted together as it would be difficult to exclude an enteroenteric fistula in this location. There is minimal wall thickening of the descending colon. The appendix appears within normal. Remainder of the small bowel and colon are unremarkable.  Vascular structures are within normal.  Pelvic images demonstrate the bladder, prostate and rectum to be within normal. There is no free fluid. Remaining bones and soft tissues are within normal.  IMPRESSION: Mild wall thickening of the descending colon as well as the cecum and ascending colon. Moderate wall thickening of the terminal ileum and adjacent ileal loops as these findings are compatible with active inflammation of patient's known Crohn's disease. Minimal associated free fluid. Several of the thick-walled ileal loops in the right lower quadrant appear to be matted together as it would be difficult to exclude an enteroenteric fistula in this location.  Focal fatty change over the liver adjacent the gallbladder fossa.   Electronically Signed  By: Marin Olp M.D.  On: 10/19/2014 00:03     IMPRESSION/PLAN: 34 year old male with hx Crohn, with a several year history of intermittent diarrhea associated with generalized abdominal pain admitted with exacerbation of pain, diarrhea, and nausea. CT reveals wall thickening of the descending colon as well as the cecum and ascending colon. Moderate wall thickening of the terminal ileum and adjacent ileal loops compatible with his Crohn's. 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 Patient currently on Solu-Medrol. Will check stool for C. Difficile. Continue pantoprazole, antiemetics, IVF. Follow CBC. Will also obtain TB QuantiFERON gold and hepatitis  panel in  anticipation of possible initiation of Biologics in the future. Patient will likely need colonoscopy later this admission, perhaps Monday for further evaluation of disease activity. Will review with attending asked to further recommendations.   Hvozdovic, Deloris Ping 10/21/2014,  Pager [phone]   Attending Addendum: I have taken an interval history, reviewed the chart, and examined the patient. I agree with the Advanced Practitioner's note and impression. 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 He essentially has never been on a maintenance regimen since his diagnosis. He reports he has been "dealing with it" on his own and hasn't been evaluated for this until recently. CT shows colonic and ileal inflammation with ? Fistula. We spent some time discussing Crohns, natural history, and current therapies available. His Crohns is clearly poorly controlled and has never been in clinical remission, only taking steroids as needed. Agree with C Diff testing and other stool studies to ensure negative up front, as if he is positive for this he will warrant therapy. Otherwise agree with IV solumedrol 71m/day to help put him into remission. He has severe disease and warrants immunosuppresive therapy with a biologic, and also likely warrants combination therapy with an immunomodulator. Will obtain quantiferon gold and hepatitis panel in case we decide to proceed with anti-TNF therapy while inpatient however unclear if his insurance will cover this outpatient and needs to be sorted out. I would also recommend TPMT enzyme assay in case thiopurines are used, and also obtain baseline ESR/CRP so we can trend it. We will re-evaluate him tomorrow. Will likely be transitioned to oral steroids once stable and then longer term management needs to be discussed. If his insurance covers it I would recommend Remicade / Humira up front + either  methotrexate or thiopurines. He also needs a restaging colonoscopy at some point in time. We will discuss long term therapy further with him during this admission. He agreed with the plan, all questions answered.   SCarolina Cellar MD [facility]amer[facility] Inc[facility]astroenterology Pager [phone]

## 2014-10-21 NOTE — H&P (Signed)
[facility] History and Physical  Damek Ende SAY:[id number] DOB: 1980-08-11 DOA: 10/21/2014  Referring physician: Dr. Stark Jock. PCP: No PCP Per Patient patient is not following with any PCP. Specialists: None.  Chief Complaint: Abdominal pain with nausea vomiting.  HPI: Jeffery Mcintosh is a 34 y.o. male with known history of Crohn's disease since age 6 and history of depression presents to the ER because of worsening abdominal pain with nausea vomiting and diarrhea. Patient had come to the ER 2 days ago and had CT abdomen and pelvis which shows intermediate changes around the terminal ileum and loops with some thickening of the ascending descending and cecum. Patient otherwise denies any fever or chills any recent travel or sick contacts or recent use of antibiotics. Denies any blood in the diarrhea. Patient was discharged home 2 days ago with prednisone despite taking which patient still has abdominal pain. Patient has been admitted for possible Crohn's exacerbation.  Review of Systems: As presented in the history of presenting illness, rest negative.  Past Medical History  Diagnosis Date  . Arthritis   . Depression   . GERD (gastroesophageal reflux disease)   . Hypertension   . Crohn's disease history of  . Bipolar affective   . Schizophrenic disorder   . Anxiety   . Chronic headaches    History reviewed. No pertinent past surgical history. Social History:  reports that he has been smoking.  He has never used smokeless tobacco. He reports that he does not drink alcohol or use illicit drugs. Where does patient live home. Can patient participate in ADLs? Yes.  No Known Allergies  Family History:  Family History  Problem Relation Age of Onset  . Hypertension Mother   . Alcohol abuse Father   . Mental illness Father   . Arthritis Maternal Grandmother   . Hypertension Maternal Grandmother   . Heart disease Maternal Grandmother   . Hyperlipidemia Maternal  Grandfather   . Stroke Paternal Grandmother   . Kidney disease Maternal Grandmother   . Diabetes Maternal Grandmother   . Cancer Maternal Grandmother     great, type unknown  . Colon polyps Maternal Grandfather   . Ulcerative colitis Maternal Aunt   . Multiple sclerosis Father   . Heart attack Mother       Prior to Admission medications   Medication Sig Start Date End Date Taking? Authorizing Provider  HYDROcodone-acetaminophen (NORCO) 5-325 MG per tablet Take 1 tablet by mouth every 4 (four) hours as needed for moderate pain. 10/19/14  Yes [name], PA-C  MELATONIN PO Take 1 tablet by mouth at bedtime as needed (for sleep).   Yes Historical Provider, MD  ondansetron (ZOFRAN ODT) 8 MG disintegrating tablet Take 1 tablet (8 mg total) by mouth every 8 (eight) hours as needed for nausea or vomiting. 10/19/14  Yes [name], PA-C  predniSONE (DELTASONE) 50 MG tablet Take 1 tablet (50 mg total) by mouth daily. 10/19/14  Yes [name], PA-C  traZODone (DESYREL) 50 MG tablet Take 50 mg by mouth at bedtime as needed for sleep.   Yes Historical Provider, MD    Physical Exam: Filed Vitals:   10/21/14 0145 10/21/14 0200 10/21/14 0215 10/21/14 0310  BP: 128/75 130/70 140/78 142/77  Pulse: 85 67 72 82  Temp:    98.6 F (37 C)  TempSrc:    Oral  Resp:    18  Height:    5' 7"  (1.702 m)  Weight:    77.565 kg (171 lb)  SpO2: 95% 98% 96% 95%     General:  Moderately built and nourished.  Eyes: Anicteric no pallor.  ENT: No discharge from the ears eyes nose and mouth.  Neck: No mass felt.  Cardiovascular: S1 and S2 heard.  Respiratory: No rhonchi or crepitations.  Abdomen: Diffuse tenderness no guarding or rigidity.  Skin: No rash.  Musculoskeletal: No edema.  Psychiatric: Appears normal.  Neurologic: Alert awake oriented to time place and person. Moves all extremities.  Labs on Admission:  Basic Metabolic Panel:  Recent Labs Lab 10/18/14 1802  10/21/14 0016  NA 139 139  K 4.0 3.8  CL 105 100*  CO2 27 29  GLUCOSE 110* 105*  BUN <5* 8  CREATININE 1.09 0.97  CALCIUM 9.7 9.6   Liver Function Tests:  Recent Labs Lab 10/18/14 1802 10/21/14 0016  AST 23 22  ALT 20 21  ALKPHOS 113 89  BILITOT 0.6 0.5  PROT 7.1 7.2  ALBUMIN 4.0 4.0    Recent Labs Lab 10/18/14 1802 10/21/14 0016  LIPASE 28 22   No results for input(s): AMMONIA in the last 168 hours. CBC:  Recent Labs Lab 10/18/14 1802 10/21/14 0016  WBC 7.1 12.9*  NEUTROABS  --  10.2*  HGB 15.1 13.9  HCT 42.8 39.2  MCV 83.8 83.9  PLT 279 273   Cardiac Enzymes: No results for input(s): CKTOTAL, CKMB, CKMBINDEX, TROPONINI in the last 168 hours.  BNP (last 3 results) No results for input(s): BNP in the last 8760 hours.  ProBNP (last 3 results) No results for input(s): PROBNP in the last 8760 hours.  CBG: No results for input(s): GLUCAP in the last 168 hours.  Radiological Exams on Admission: No results found.   Assessment/Plan Principal Problem:   Exacerbation of Crohn's disease Active Problems:   Crohn's disease   1. Crohn's exacerbation - patient has been kept nothing by mouth. Patient has received IV Solu-Medrol in the ER and I have placed patient on 40 mg IV daily. Consult gastroenterologist in a.m. for further recommendations. I have also ordered acute abdominal series to rule out any obstruction. Continue IV fluids and pain relief medications. Since patient has diarrhea we will check stool studies.   DVT Prophylaxis Lovenox.  Code Status: Full code.  Family Communication: Discussed with patient.  Disposition Plan: Admit to inpatient.    KAKRAKANDY,ARSHAD N. [facility] Pager [phone].  If 7PM-7AM, please contact night-coverage [email or web address] Password [facility] 10/21/2014, 4:58 AM

## 2014-10-21 NOTE — Progress Notes (Signed)
[facility]                                                                              Patient Demographics  Jeffery Mcintosh, is a 34 y.o. male, DOB - 1980-06-07, YOV:[id number]  Admit date - 10/21/2014   Admitting Physician [name], MD  Outpatient Primary MD for the patient is No PCP Per Patient  LOS - 0   Chief Complaint  Patient presents with  . Abdominal Pain       Brief HPI   Jeffery Mcintosh is a 34 y.o. male with known history of Crohn's disease since age 54 and history of depression presented to the ER because of worsening abdominal pain with nausea vomiting and diarrhea. Patient had come to the ER 2 days ago and had CT abdomen and pelvis on 9/14 which shows intermediate changes around the terminal ileum and loops with some thickening of the ascending descending and cecum. Patient otherwise denied any fever or chills any recent travel or sick contacts or recent use of antibiotics. Denied any blood in the diarrhea. Patient was discharged home from ED 2 days ago with prednisone despite taking which patient still continued to have abdominal pain. Patient was admitted for possible Crohn's exacerbation   Assessment & Plan    Principal Problem:   Exacerbation of Crohn's disease - Cont clears, IVF - received solumedrol 134m x1, placed solumedrol 619mIV daily - called GI consut, d/w Dr ArHavery Moros KUB neg for obstruction   Active Problems: Diarrhea - obtain GI pathogen panel, C diff  GERD - patient c/o worse heartburn, placed on PPI   Code Status:  Full code  Family Communication: Discussed in detail with the patient, all imaging results, lab results explained to the patient and fiancee   Disposition Plan: not medically ready  Time Spent in minutes   2545mtes  Procedures  CT abd 9/14  Consults   GI   DVT Prophylaxis   Lovenox   Medications  Scheduled Meds: . enoxaparin (LOVENOX) injection  40 mg Subcutaneous Q24H    . methylPREDNISolone (SOLU-MEDROL) injection  60 mg Intravenous Daily  . pantoprazole (PROTONIX) IV  40 mg Intravenous Q12H   Continuous Infusions: . dextrose 5 % and 0.9% NaCl 100 mL/hr at 10/21/14 0600   PRN Meds:.acetaminophen **OR** acetaminophen, HYDROmorphone (DILAUDID) injection, Influenza vac split quadrivalent PF, ondansetron **OR** ondansetron (ZOFRAN) IV, pneumococcal 23 valent vaccine, traZODone   Antibiotics   Anti-infectives    None        Subjective:   Jeffery Mcintosh seen and examined today. Still having abd pain, nausea,  "heart burn", no fevers, chills. Patient denies dizziness, chest pain, shortness of breath, , new weakness, numbess, tingling. No acute events overnight.    Objective:   Blood pressure 142/77, pulse 82, temperature 98.6 F (37 C), temperature source Oral, resp. rate 18, height 5' 7"  (1.702 m), weight 77.565 kg (171 lb), SpO2 95 %.  Wt Readings from Last 3 Encounters:  10/21/14 77.565 kg (171 lb)  10/18/14 81.647 kg (180 lb)  04/18/13 89.132 kg (196 lb 8 oz)     Intake/Output Summary (Last  24 hours) at 10/21/14 1421 Last data filed at 10/21/14 0324  Gross per 24 hour  Intake    120 ml  Output      0 ml  Net    120 ml    Exam  General: Alert and oriented x 3, NAD  HEENT:  PERRLA, EOMI, Anicteric Sclera, mucous membranes moist.   Neck: Supple, no JVD, no masses  CVS: S1 S2 auscultated, no rubs, murmurs or gallops. Regular rate and rhythm.  Respiratory: Clear to auscultation bilaterally, no wheezing, rales or rhonchi  Abdomen: Soft, mild diffuse tenderness, nondistended, + bowel sounds  Ext: no cyanosis clubbing or edema  Neuro: AAOx3, Cr N's II- XII. Strength 5/5 upper and lower extremities bilaterally  Skin: No rashes  Psych: Normal affect and demeanor, alert and oriented x3    Data Review   Micro Results No results found for this or any previous visit (from the past 240 hour(s)).  Radiology Reports Ct  Abdomen Pelvis W Contrast  10/19/2014   CLINICAL DATA:  Diffuse mid to lower abdominal pain radiating into testicles with nausea, vomiting and diarrhea 4 days. History of Crohn's disease.  EXAM: CT ABDOMEN AND PELVIS WITH CONTRAST  TECHNIQUE: Multidetector CT imaging of the abdomen and pelvis was performed using the standard protocol following bolus administration of intravenous contrast.  CONTRAST:  100 mL Omnipaque 300 IV  COMPARISON:  None.  FINDINGS: Lung bases are normal.  Abdominal images demonstrate focal fatty infiltration of the liver adjacent the gallbladder fossa. The spleen, pancreas, gallbladder and adrenal glands are normal. Kidneys are normal in size without hydronephrosis or nephrolithiasis. Ureters are normal.  There is mild wall thickening of the ascending colon/ cecum with moderate wall thickening of the terminal ileum and adjacent ileal loops. Findings are compatible patient's known Crohn's disease. Minimal free fluid over the right lower quadrant. Several thick-walled small bowel loops in the right lower quadrant appear to be somewhat matted together as it would be difficult to exclude an enteroenteric fistula in this location. There is minimal wall thickening of the descending colon. The appendix appears within normal. Remainder of the small bowel and colon are unremarkable.  Vascular structures are within normal.  Pelvic images demonstrate the bladder, prostate and rectum to be within normal. There is no free fluid. Remaining bones and soft tissues are within normal.  IMPRESSION: Mild wall thickening of the descending colon as well as the cecum and ascending colon. Moderate wall thickening of the terminal ileum and adjacent ileal loops as these findings are compatible with active inflammation of patient's known Crohn's disease. Minimal associated free fluid. Several of the thick-walled ileal loops in the right lower quadrant appear to be matted together as it would be difficult to exclude an  enteroenteric fistula in this location.  Focal fatty change over the liver adjacent the gallbladder fossa.   Electronically Signed   By: Marin Olp M.D.   On: 10/19/2014 00:03   Dg Abd Acute W/chest  10/21/2014   CLINICAL DATA:  Per patient upper abdominal pressure and sharp pain in the lower part of his abdomen since Monday. Patient states that he has been vomiting and had diarrhea since Monday.  EXAM: DG ABDOMEN ACUTE W/ 1V CHEST  COMPARISON:  None.  FINDINGS: There is no evidence of dilated bowel loops or free intraperitoneal air. No radiopaque calculi or other significant radiographic abnormality is seen. Heart size and mediastinal contours are within normal limits. Both lungs are clear.  IMPRESSION: Negative abdominal  radiographs.  No acute cardiopulmonary disease.   Electronically Signed   By: Kathreen Devoid   On: 10/21/2014 10:38    CBC  Recent Labs Lab 10/18/14 1802 10/21/14 0016 10/21/14 0918  WBC 7.1 12.9* 13.2*  HGB 15.1 13.9 14.0  HCT 42.8 39.2 40.3  PLT 279 273 287  MCV 83.8 83.9 84.0  MCH 29.5 29.8 29.2  MCHC 35.3 35.5 34.7  RDW 14.3 14.2 14.1  LYMPHSABS  --  1.6 0.8  MONOABS  --  1.1* 0.2  EOSABS  --  0.0 0.0  BASOSABS  --  0.0 0.0    Chemistries   Recent Labs Lab 10/18/14 1802 10/21/14 0016 10/21/14 0918  NA 139 139 139  K 4.0 3.8 4.3  CL 105 100* 103  CO2 27 29 27   GLUCOSE 110* 105* 150*  BUN <5* 8 7  CREATININE 1.09 0.97 0.96  CALCIUM 9.7 9.6 9.1  AST 23 22 25   ALT 20 21 20   ALKPHOS 113 89 86  BILITOT 0.6 0.5 0.4   ------------------------------------------------------------------------------------------------------------------ estimated creatinine clearance is 103.3 mL/min (by C-G formula based on Cr of 0.96). ------------------------------------------------------------------------------------------------------------------ No results for input(s): HGBA1C in the last 72  hours. ------------------------------------------------------------------------------------------------------------------ No results for input(s): CHOL, HDL, LDLCALC, TRIG, CHOLHDL, LDLDIRECT in the last 72 hours. ------------------------------------------------------------------------------------------------------------------ No results for input(s): TSH, T4TOTAL, T3FREE, THYROIDAB in the last 72 hours.  Invalid input(s): FREET3 ------------------------------------------------------------------------------------------------------------------ No results for input(s): VITAMINB12, FOLATE, FERRITIN, TIBC, IRON, RETICCTPCT in the last 72 hours.  Coagulation profile No results for input(s): INR, PROTIME in the last 168 hours.  No results for input(s): DDIMER in the last 72 hours.  Cardiac Enzymes No results for input(s): CKMB, TROPONINI, MYOGLOBIN in the last 168 hours.  Invalid input(s): CK ------------------------------------------------------------------------------------------------------------------ Invalid input(s): POCBNP  No results for input(s): GLUCAP in the last 72 hours.   Julez Huseby M.D. [facility] 10/21/2014, 2:21 PM  Pager: [phone] Between 7am to 7pm - call Pager - [phone]  After 7pm go to [email or web address] - password [email or web address]  Call night coverage person covering after 7pm

## 2014-10-21 NOTE — ED Notes (Signed)
Pt attempting to take medications from outside of facility. Instructed not to do this.

## 2014-10-21 NOTE — ED Notes (Signed)
Called in room by patient, wife asking questions regarding necessity of admission, wondering if pt can get RX for home. No order at this time for admission.

## 2014-10-21 NOTE — ED Notes (Signed)
The pt is c/o abd pain with burning in his throat down to his abd.  He was seen here on the 12th  For the same.  Hx crohns disease

## 2014-10-21 NOTE — ED Provider Notes (Signed)
CSN: [id number]   Arrival date & time 10/20/14 2357  History  This chart was scribed for Veryl Speak, MD by Altamease Oiler, ED Scribe. This patient was seen in room A13C/A13C and the patient's care was started at 12:14 AM.  Chief Complaint  Patient presents with  . Abdominal Pain    HPI The history is provided by the patient. No language interpreter was used.   Jeffery Mcintosh is a 34 y.o. male with PMHx of Crohn's disease who presents to the Emergency Department complaining of recurrent and severe abdominal pain with onset several days ago. The pain is described as burning.  Associated symptoms include nausea, vomiting, and diarrhea.He has had several episodes of emesis and 1 episode was red streaked. Pt states that he has bimonthly flare ups but this one is worse than usual. Pt was seen in the ED on 10/16/14 for the same symptoms. He was discharged with prednisone and states that he has been taking it.  In the past he was on dicyclomine but has been unable to see GI because of financial constraint. Pt denies fever and hematochezia.   Past Medical History  Diagnosis Date  . Arthritis   . Depression   . GERD (gastroesophageal reflux disease)   . Hypertension   . Crohn's disease history of  . Bipolar affective   . Schizophrenic disorder   . Anxiety   . Chronic headaches     History reviewed. No pertinent past surgical history.  Family History  Problem Relation Age of Onset  . Hypertension Mother   . Alcohol abuse Father   . Mental illness Father   . Arthritis Maternal Grandmother   . Hypertension Maternal Grandmother   . Heart disease Maternal Grandmother   . Hyperlipidemia Maternal Grandfather   . Stroke Paternal Grandmother   . Kidney disease Maternal Grandmother   . Diabetes Maternal Grandmother   . Cancer Maternal Grandmother     great, type unknown  . Colon polyps Maternal Grandfather   . Ulcerative colitis Maternal Aunt   . Multiple sclerosis Father   . Heart  attack Mother     Social History  Substance Use Topics  . Smoking status: Current Every Day Smoker  . Smokeless tobacco: Never Used  . Alcohol Use: No     Review of Systems 10 Systems reviewed and all are negative for acute change except as noted in the HPI. Home Medications   Prior to Admission medications   Medication Sig Start Date End Date Taking? Authorizing Provider  cephALEXin (KEFLEX) 500 MG capsule Take 1 capsule (500 mg total) by mouth 4 (four) times daily. 04/17/13   Varney Biles, MD  dicyclomine (BENTYL) 20 MG tablet Take 1 tablet (20 mg total) by mouth 4 (four) times daily -  before meals and at bedtime. 04/18/13   Ladene Artist, MD  HYDROcodone-acetaminophen (NORCO) 10-325 MG per tablet Take 1 tablet by mouth every 8 (eight) hours as needed. 04/18/13   Brunetta Jeans, PA-C  HYDROcodone-acetaminophen (NORCO) 5-325 MG per tablet Take 1 tablet by mouth every 4 (four) hours as needed for moderate pain. 10/19/14   [name], PA-C  ibuprofen (ADVIL,MOTRIN) 600 MG tablet Take 1 tablet (600 mg total) by mouth every 6 (six) hours as needed. 04/17/13   Varney Biles, MD  MELATONIN PO Take 1 tablet by mouth at bedtime as needed (for sleep).    Historical Provider, MD  ondansetron (ZOFRAN ODT) 8 MG disintegrating tablet Take 1 tablet (8 mg total)  by mouth every 8 (eight) hours as needed for nausea or vomiting. 10/19/14   Jeannett Senior, PA-C  predniSONE (DELTASONE) 50 MG tablet Take 1 tablet (50 mg total) by mouth daily. 10/19/14   [name], PA-C  traZODone (DESYREL) 50 MG tablet Take 50 mg by mouth at bedtime as needed for sleep.    Historical Provider, MD    Allergies  Review of patient's allergies indicates no known allergies.  Triage Vitals: BP 149/80 mmHg  Pulse 71  Temp(Src) 98.3 F (36.8 C) (Oral)  Resp 16  SpO2 98%  Physical Exam  Constitutional: He is oriented to person, place, and time. He appears well-developed and well-nourished.  HENT:   Head: Normocephalic and atraumatic.  Eyes: EOM are normal.  Neck: Normal range of motion.  Cardiovascular: Normal rate, regular rhythm, normal heart sounds and intact distal pulses.   Pulmonary/Chest: Effort normal and breath sounds normal. No respiratory distress.  Abdominal: Soft. He exhibits no distension. There is tenderness. There is no rebound and no guarding.  There is TTP in all 4 quadrants. No rebound or guarding.  Musculoskeletal: Normal range of motion.  Neurological: He is alert and oriented to person, place, and time.  Skin: Skin is warm and dry.  Psychiatric: He has a normal mood and affect. Judgment normal.  Nursing note and vitals reviewed.   ED Course  Procedures   DIAGNOSTIC STUDIES: Oxygen Saturation is 98% on RA, normal by my interpretation.    COORDINATION OF CARE: 12:19 AM Discussed treatment plan which includes lab work, IVF, Solu-medrol, Zofran, and Dilaudid with pt at bedside and pt agreed to plan.  1:30 AM-Consult complete with Dr. Gaston Islam (Internal Medicine). Patient case explained and discussed. He agrees to admit patient for further evaluation and treatment. Call ended at 1:31 AM    Labs Reviewed  LIPASE, BLOOD  COMPREHENSIVE METABOLIC PANEL  URINALYSIS, ROUTINE W REFLEX MICROSCOPIC (NOT AT [facility])  CBC WITH DIFFERENTIAL/PLATELET    Imaging Review No results found.  I, Veryl Speak, MD, personally reviewed and evaluated these lab results as part of my medical decision-making.    MDM   Final diagnoses:  None     Patient presents with complaints of abdominal pain. He has a history of Crohn's and was seen here 2 days ago for similar symptoms. He is not improving despite the medications he was started on. He denies bloody stool. He denies fevers or chills.  Laboratory studies are essentially unremarkable. As the patient appears to be failing outpatient Crohn's treatment, he will be admitted for IV steroids, IV fluids, and pain  control.   I personally performed the services described in this documentation, which was scribed in my presence. The recorded information has been reviewed and is accurate.     Veryl Speak, MD 10/21/14 [phone]

## 2014-10-22 DIAGNOSIS — R197 Diarrhea, unspecified: Secondary | ICD-10-CM

## 2014-10-22 LAB — CBC
HCT: 39.6 % (ref 39.0–52.0)
HEMOGLOBIN: 13.9 g/dL (ref 13.0–17.0)
MCH: 29.7 pg (ref 26.0–34.0)
MCHC: 35.1 g/dL (ref 30.0–36.0)
MCV: 84.6 fL (ref 78.0–100.0)
Platelets: 253 10*3/uL (ref 150–400)
RBC: 4.68 MIL/uL (ref 4.22–5.81)
RDW: 14.4 % (ref 11.5–15.5)
WBC: 15.6 10*3/uL — ABNORMAL HIGH (ref 4.0–10.5)

## 2014-10-22 LAB — HEPATITIS B SURFACE ANTIGEN: HEP B S AG: NEGATIVE

## 2014-10-22 LAB — BASIC METABOLIC PANEL
ANION GAP: 9 (ref 5–15)
BUN: 7 mg/dL (ref 6–20)
CALCIUM: 9.1 mg/dL (ref 8.9–10.3)
CO2: 26 mmol/L (ref 22–32)
Chloride: 105 mmol/L (ref 101–111)
Creatinine, Ser: 0.86 mg/dL (ref 0.61–1.24)
Glucose, Bld: 106 mg/dL — ABNORMAL HIGH (ref 65–99)
Potassium: 3.9 mmol/L (ref 3.5–5.1)
Sodium: 140 mmol/L (ref 135–145)

## 2014-10-22 LAB — C DIFFICILE QUICK SCREEN W PCR REFLEX
C DIFFICILE (CDIFF) TOXIN: NEGATIVE
C DIFFICLE (CDIFF) ANTIGEN: NEGATIVE
C Diff interpretation: NEGATIVE

## 2014-10-22 LAB — HEPATITIS A ANTIBODY, TOTAL: Hep A Total Ab: NEGATIVE

## 2014-10-22 LAB — HEPATITIS C ANTIBODY

## 2014-10-22 LAB — HEPATITIS B SURFACE ANTIBODY, QUANTITATIVE: HEPATITIS B-POST: 3.5 m[IU]/mL — AB

## 2014-10-22 MED ORDER — PREDNISONE 20 MG PO TABS
40.0000 mg | ORAL_TABLET | Freq: Every day | ORAL | Status: DC
  Administered 2014-10-23: 40 mg via ORAL
  Filled 2014-10-22: qty 2

## 2014-10-22 NOTE — Progress Notes (Signed)
Progress Note   Subjective  Still some abdominal pain but overall better.    Objective   Vital signs in last 24 hours: Temp:  [98 F (36.7 C)-98.1 F (36.7 C)] 98.1 F (36.7 C) (09/17 2250) Pulse Rate:  [57-75] 57 (09/17 2250) Resp:  [16-18] 18 (09/17 2250) BP: (124-133)/(73-74) 133/74 mmHg (09/17 2250) SpO2:  [98 %-99 %] 98 % (09/17 2250) Last BM Date: 10/21/14 General:    Jeffery Mcintosh in NAD Heart:  Regular rate and rhythm Abdomen:  Soft, nontender and nondistended. Normal bowel sounds. Extremities:  Without edema. Neurologic:  Alert and oriented,  grossly normal neurologically. Psych:  Cooperative. Normal mood and affect.    Lab Results:  Recent Labs  10/21/14 0016 10/21/14 0918 10/22/14 0501  WBC 12.9* 13.2* 15.6*  HGB 13.9 14.0 13.9  HCT 39.2 40.3 39.6  PLT 273 287 253   BMET  Recent Labs  10/21/14 0016 10/21/14 0918 10/22/14 0501  NA 139 139 140  K 3.8 4.3 3.9  CL 100* 103 105  CO2 29 27 26   GLUCOSE 105* 150* 106*  BUN 8 7 7   CREATININE 0.97 0.96 0.86  CALCIUM 9.6 9.1 9.1   LFT  Recent Labs  10/21/14 0918  PROT 7.7  ALBUMIN 3.9  AST 25  ALT 20  ALKPHOS 86  BILITOT 0.4     Assessment / Plan:   34 year old Mcintosh with longstanding Crohn's disease, untreated. Patient "just deals with it". Admitted with pain and CTscan findings of colonic and ileal inflammation with possible fistula. Stool studies pending. His CRP is < 0.5, ESR only 4. Continue IV solumedrol for now.   ? Eventual Biologics - Hepatitis B,C serologies negative. Quantiferon Gold (for TB) is pending.   He needs a colonoscopy for restaging of Crohn's.    LOS: 1 day   Jeffery Mcintosh  10/22/2014, 9:49 AM   Attending Addendum: I have taken an interval history, reviewed the chart, and examined the patient. I agree with the Advanced Practitioner's note and impression. Overall doing significantly better, he reports "70%" improvement since coming into the hospital. Just got another  dose of IV steroids this AM. His diarrhea has resolved, now only some mild abdominal pain. Will transition to oral prednisone tomorrow, and if he tolerates it can be discharged later tomorrow or Tuesday. C diff testing not done yet as he stopped having diarrhea, thus seems unlikely.   Longer term, we need to transition to nonsteroid regimen to keep him in remission. He has severe disease and warrants immunosuppresive therapy with a biologic, and also likely warrants combination therapy with an immunomodulator.  Quantiferon gold and hepatitis panel obtained however unclear if his insurance will cover anti-TNF when outpatient and needs to be sorted out. I would also recommend TPMT enzyme assay in case thiopurines are used. For now, will transition to 2m prednisone tomorrow and he will be on this dose for 2 weeks, followed by taper of 585mper week until done. Hopefully during this taper he can be started on a biologic. He will f/u with Dr. StFuller Plann our clinic. If his insurance covers it I would recommend Remicade / Humira up front + either methotrexate or thiopurine. He also needs a restaging colonoscopy at some point in time.   He agreed with the plan, all questions answered.   StCarolina CellarMD [facility] Pager [phone]

## 2014-10-22 NOTE — Progress Notes (Signed)
[facility]                                                                              Patient Demographics  Jeffery Mcintosh, is a 34 y.o. male, DOB - [birth year], ANV:[id number]  Admit date - 10/21/2014   Admitting Physician [name], MD  Outpatient Primary MD for the patient is No PCP Per Patient  LOS - 1   Chief Complaint  Patient presents with  . Abdominal Pain       Brief HPI   Jeffery Mcintosh is a 34 y.o. male with known history of Crohn's disease since age 57 and history of depression presented to the ER because of worsening abdominal pain with nausea vomiting and diarrhea. Patient had come to the ER 2 days ago and had CT abdomen and pelvis on 9/14 which shows intermediate changes around the terminal ileum and loops with some thickening of the ascending descending and cecum. Patient otherwise denied any fever or chills any recent travel or sick contacts or recent use of antibiotics. Denied any blood in the diarrhea. Patient was discharged home from ED 2 days ago with prednisone despite taking which patient still continued to have abdominal pain. Patient was admitted for possible Crohn's exacerbation   Assessment & Plan    Principal Problem:   Exacerbation of Crohn's disease- improving - Advance diet today. - Patient received Solu-Medrol 146m x1, then solumedrol 636mIV daily. Transition to oral prednisone in a.m. - KUB neg for obstruction  - Per gastroenterology, patient has severe disease and wants immunosuppressive therapy with biologic agent. For now transitioned to prednisone and he will be on this dose for 2 weeks followed by taper of 5 mg per week. Follow-up with Dr. StFuller Planif his insurance covers it, recommended Remicade/Humira with either methotrexate or imuran. Coloscopy in future.  Active Problems: Diarrhea - Improved  GERD - patient c/o worse heartburn, continue PPI  Code Status:  Full code  Family Communication:  Discussed in detail with the patient, all imaging results, lab results explained to the patient and fiancee   Disposition Plan: not medically ready  Time Spent in minutes   2552mtes  Procedures  CT abd 9/14  Consults   GI   DVT Prophylaxis   Lovenox   Medications  Scheduled Meds: . enoxaparin (LOVENOX) injection  40 mg Subcutaneous Q24H  . pantoprazole (PROTONIX) IV  40 mg Intravenous Q12H  . [START ON 10/23/2014] predniSONE  40 mg Oral Q breakfast   Continuous Infusions:   PRN Meds:.acetaminophen **OR** acetaminophen, dicyclomine, HYDROmorphone (DILAUDID) injection, Influenza vac split quadrivalent PF, ondansetron **OR** ondansetron (ZOFRAN) IV, pneumococcal 23 valent vaccine, traZODone   Antibiotics   Anti-infectives    None        Subjective:   Jeffery Mcintosh seen and examined today. Significantly improved today, abdominal pain is improving, hungry, no fevers or no diarrhea.  Patient denies dizziness, chest pain, shortness of breath, no new weakness, numbess, tingling. No acute events overnight.    Objective:   Blood pressure 133/74, pulse 57, temperature 98.1 F (36.7 C), temperature source Oral, resp. rate 18, height 5' 7"  (1.702  m), weight 77.565 kg (171 lb), SpO2 98 %.  Wt Readings from Last 3 Encounters:  10/21/14 77.565 kg (171 lb)  10/18/14 81.647 kg (180 lb)  04/18/13 89.132 kg (196 lb 8 oz)     Intake/Output Summary (Last 24 hours) at 10/22/14 1243 Last data filed at 10/22/14 1100  Gross per 24 hour  Intake   3280 ml  Output    350 ml  Net   2930 ml    Exam  General: Alert and oriented x 3, NAD  HEENT:  PERRLA, EOMI, Anicteric Sclera, mucous membranes moist.   Neck: Supple, no JVD, no masses  CVS: S1 S2 clear, RRR  Respiratory: CTAB  Abdomen: Soft, mild diffuse tenderness, nondistended, + bowel sounds  Ext: no cyanosis clubbing or edema  Neuro: no new deficits  Skin: No rashes  Psych: Normal affect and demeanor, alert  and oriented x3    Data Review   Micro Results No results found for this or any previous visit (from the past 240 hour(s)).  Radiology Reports Ct Abdomen Pelvis W Contrast  10/19/2014   CLINICAL DATA:  Diffuse mid to lower abdominal pain radiating into testicles with nausea, vomiting and diarrhea 4 days. History of Crohn's disease.  EXAM: CT ABDOMEN AND PELVIS WITH CONTRAST  TECHNIQUE: Multidetector CT imaging of the abdomen and pelvis was performed using the standard protocol following bolus administration of intravenous contrast.  CONTRAST:  100 mL Omnipaque 300 IV  COMPARISON:  None.  FINDINGS: Lung bases are normal.  Abdominal images demonstrate focal fatty infiltration of the liver adjacent the gallbladder fossa. The spleen, pancreas, gallbladder and adrenal glands are normal. Kidneys are normal in size without hydronephrosis or nephrolithiasis. Ureters are normal.  There is mild wall thickening of the ascending colon/ cecum with moderate wall thickening of the terminal ileum and adjacent ileal loops. Findings are compatible patient's known Crohn's disease. Minimal free fluid over the right lower quadrant. Several thick-walled small bowel loops in the right lower quadrant appear to be somewhat matted together as it would be difficult to exclude an enteroenteric fistula in this location. There is minimal wall thickening of the descending colon. The appendix appears within normal. Remainder of the small bowel and colon are unremarkable.  Vascular structures are within normal.  Pelvic images demonstrate the bladder, prostate and rectum to be within normal. There is no free fluid. Remaining bones and soft tissues are within normal.  IMPRESSION: Mild wall thickening of the descending colon as well as the cecum and ascending colon. Moderate wall thickening of the terminal ileum and adjacent ileal loops as these findings are compatible with active inflammation of patient's known Crohn's disease. Minimal  associated free fluid. Several of the thick-walled ileal loops in the right lower quadrant appear to be matted together as it would be difficult to exclude an enteroenteric fistula in this location.  Focal fatty change over the liver adjacent the gallbladder fossa.   Electronically Signed   By: Marin Olp M.D.   On: 10/19/2014 00:03   Dg Abd Acute W/chest  10/21/2014   CLINICAL DATA:  Per patient upper abdominal pressure and sharp pain in the lower part of his abdomen since Monday. Patient states that he has been vomiting and had diarrhea since Monday.  EXAM: DG ABDOMEN ACUTE W/ 1V CHEST  COMPARISON:  None.  FINDINGS: There is no evidence of dilated bowel loops or free intraperitoneal air. No radiopaque calculi or other significant radiographic abnormality is seen. Heart size and  mediastinal contours are within normal limits. Both lungs are clear.  IMPRESSION: Negative abdominal radiographs.  No acute cardiopulmonary disease.   Electronically Signed   By: Kathreen Devoid   On: 10/21/2014 10:38    CBC  Recent Labs Lab 10/18/14 1802 10/21/14 0016 10/21/14 0918 10/22/14 0501  WBC 7.1 12.9* 13.2* 15.6*  HGB 15.1 13.9 14.0 13.9  HCT 42.8 39.2 40.3 39.6  PLT 279 273 287 253  MCV 83.8 83.9 84.0 84.6  MCH 29.5 29.8 29.2 29.7  MCHC 35.3 35.5 34.7 35.1  RDW 14.3 14.2 14.1 14.4  LYMPHSABS  --  1.6 0.8  --   MONOABS  --  1.1* 0.2  --   EOSABS  --  0.0 0.0  --   BASOSABS  --  0.0 0.0  --     Chemistries   Recent Labs Lab 10/18/14 1802 10/21/14 0016 10/21/14 0918 10/22/14 0501  NA 139 139 139 140  K 4.0 3.8 4.3 3.9  CL 105 100* 103 105  CO2 27 29 27 26   GLUCOSE 110* 105* 150* 106*  BUN <5* 8 7 7   CREATININE 1.09 0.97 0.96 0.86  CALCIUM 9.7 9.6 9.1 9.1  AST 23 22 25   --   ALT 20 21 20   --   ALKPHOS 113 89 86  --   BILITOT 0.6 0.5 0.4  --    ------------------------------------------------------------------------------------------------------------------ estimated creatinine  clearance is 115.3 mL/min (by C-G formula based on Cr of 0.86). ------------------------------------------------------------------------------------------------------------------ No results for input(s): HGBA1C in the last 72 hours. ------------------------------------------------------------------------------------------------------------------ No results for input(s): CHOL, HDL, LDLCALC, TRIG, CHOLHDL, LDLDIRECT in the last 72 hours. ------------------------------------------------------------------------------------------------------------------ No results for input(s): TSH, T4TOTAL, T3FREE, THYROIDAB in the last 72 hours.  Invalid input(s): FREET3 ------------------------------------------------------------------------------------------------------------------ No results for input(s): VITAMINB12, FOLATE, FERRITIN, TIBC, IRON, RETICCTPCT in the last 72 hours.  Coagulation profile No results for input(s): INR, PROTIME in the last 168 hours.  No results for input(s): DDIMER in the last 72 hours.  Cardiac Enzymes No results for input(s): CKMB, TROPONINI, MYOGLOBIN in the last 168 hours.  Invalid input(s): CK ------------------------------------------------------------------------------------------------------------------ Invalid input(s): POCBNP  No results for input(s): GLUCAP in the last 72 hours.   Cheyna Retana M.D. [facility] 10/22/2014, 12:43 PM  Pager: [phone] Between 7am to 7pm - call Pager - [phone]  After 7pm go to [email or web address] - password [email or web address]  Call night coverage person covering after 7pm

## 2014-10-23 ENCOUNTER — Telehealth: Payer: Self-pay

## 2014-10-23 DIAGNOSIS — K5090 Crohn's disease, unspecified, without complications: Secondary | ICD-10-CM | POA: Diagnosis not present

## 2014-10-23 DIAGNOSIS — K50119 Crohn's disease of large intestine with unspecified complications: Secondary | ICD-10-CM

## 2014-10-23 LAB — BASIC METABOLIC PANEL
ANION GAP: 7 (ref 5–15)
BUN: 9 mg/dL (ref 6–20)
CO2: 30 mmol/L (ref 22–32)
Calcium: 8.9 mg/dL (ref 8.9–10.3)
Chloride: 102 mmol/L (ref 101–111)
Creatinine, Ser: 0.87 mg/dL (ref 0.61–1.24)
GLUCOSE: 103 mg/dL — AB (ref 65–99)
POTASSIUM: 3.8 mmol/L (ref 3.5–5.1)
Sodium: 139 mmol/L (ref 135–145)

## 2014-10-23 LAB — CBC
HEMATOCRIT: 37.3 % — AB (ref 39.0–52.0)
HEMOGLOBIN: 13 g/dL (ref 13.0–17.0)
MCH: 29.2 pg (ref 26.0–34.0)
MCHC: 34.9 g/dL (ref 30.0–36.0)
MCV: 83.8 fL (ref 78.0–100.0)
Platelets: 233 10*3/uL (ref 150–400)
RBC: 4.45 MIL/uL (ref 4.22–5.81)
RDW: 14.1 % (ref 11.5–15.5)
WBC: 10.4 10*3/uL (ref 4.0–10.5)

## 2014-10-23 MED ORDER — PREDNISONE 10 MG PO TABS: ORAL_TABLET | ORAL | Status: DC

## 2014-10-23 MED ORDER — DICYCLOMINE HCL 10 MG PO CAPS: 10.0000 mg | ORAL_CAPSULE | Freq: Four times a day (QID) | ORAL | Status: DC | PRN

## 2014-10-23 MED ORDER — PANTOPRAZOLE SODIUM 40 MG PO TBEC
40.0000 mg | DELAYED_RELEASE_TABLET | Freq: Two times a day (BID) | ORAL | Status: DC
  Administered 2014-10-23: 40 mg via ORAL
  Filled 2014-10-23: qty 1

## 2014-10-23 MED ORDER — PROMETHAZINE HCL 25 MG PO TABS: 25.0000 mg | ORAL_TABLET | Freq: Four times a day (QID) | ORAL | Status: DC | PRN

## 2014-10-23 MED ORDER — PANTOPRAZOLE SODIUM 40 MG PO TBEC: 40.0000 mg | DELAYED_RELEASE_TABLET | Freq: Two times a day (BID) | ORAL | Status: DC

## 2014-10-23 MED ORDER — PANTOPRAZOLE SODIUM 40 MG PO TBEC: 40.0000 mg | DELAYED_RELEASE_TABLET | Freq: Every day | ORAL | Status: DC

## 2014-10-23 MED ORDER — PREDNISONE 10 MG PO TABS
ORAL_TABLET | ORAL | Status: DC
Start: 2014-10-23 — End: 2014-11-16

## 2014-10-23 MED ORDER — HYDROCODONE-ACETAMINOPHEN 5-325 MG PO TABS: 1.0000 | ORAL_TABLET | Freq: Four times a day (QID) | ORAL | Status: DC | PRN

## 2014-10-23 NOTE — Telephone Encounter (Signed)
Orders placed.  Jeffery Mcintosh he can come anytime for the labs No openings in the [facility]hospital at all for October- sorry

## 2014-10-23 NOTE — Progress Notes (Signed)
Discharge instructions reviewed with pt and pt's wife and prescriptions given.  Pt verbalized understanding and had no questions.  Pt discharged in stable condition with wife.  Eliezer [place]

## 2014-10-23 NOTE — Progress Notes (Signed)
[facility] Progress Note  Subjective:  White blood count 10.4 this morning.  Hepatitis B surface antibody 3.5, inconsistent with immunity. Hepatitis B surface antigen negative. Hepatitis C antibody negative. Hepatitis A total antibody negative. Tolerating soft diet. CT scan findings consistent with colonic and ileal inflammation with possible fistula. Stool studies pending. CRP less than 5, ESR 4. Will need a colonoscopy for restaging of Crohn's.   Objective:  Vital signs in last 24 hours: Temp:  [98.3 F (36.8 C)-98.9 F (37.2 C)] 98.3 F (36.8 C) (09/19 0520) Pulse Rate:  [65-73] 73 (09/19 0520) Resp:  [18] 18 (09/19 0520) BP: (115-124)/(66-74) 119/66 mmHg (09/19 0520) SpO2:  [98 %-99 %] 98 % (09/19 0520) Last BM Date: 10/22/14 General:   Alert,  Well-developed,  AA male  in NAD Heart:  Regular rate and rhythm; no murmurs Pulm;lungs clear Abdomen:  Soft, nontender and nondistended. Normal bowel sounds, without guarding, and without rebound.   Extremities:  Without edema. Neurologic: Alert and  oriented x4;  grossly normal neurologically. Psych:  Alert and cooperative. Normal mood and affect.  Intake/Output from previous day: 09/18 0701 - 09/19 0700 In: 1070 [P.O.:1070] Out: -  Intake/Output this shift:    Lab Results:  Recent Labs  10/21/14 0918 10/22/14 0501 10/23/14 0346  WBC 13.2* 15.6* 10.4  HGB 14.0 13.9 13.0  HCT 40.3 39.6 37.3*  PLT 287 253 233   BMET  Recent Labs  10/21/14 0918 10/22/14 0501 10/23/14 0346  NA 139 140 139  K 4.3 3.9 3.8  CL 103 105 102  CO2 27 26 30   GLUCOSE 150* 106* 103*  BUN 7 7 9   CREATININE 0.96 0.86 0.87  CALCIUM 9.1 9.1 8.9   LFT  Recent Labs  10/21/14 0918  PROT 7.7  ALBUMIN 3.9  AST 25  ALT 20  ALKPHOS 86  BILITOT 0.4    Hepatitis Panel  Recent Labs  10/21/14 1655  HEPBSAG Negative  HCVAB <0.1    Dg Abd Acute W/chest  10/21/2014   CLINICAL DATA:  Per patient upper abdominal  pressure and sharp pain in the lower part of his abdomen since Monday. Patient states that he has been vomiting and had diarrhea since Monday.  EXAM: DG ABDOMEN ACUTE W/ 1V CHEST  COMPARISON:  None.  FINDINGS: There is no evidence of dilated bowel loops or free intraperitoneal air. No radiopaque calculi or other significant radiographic abnormality is seen. Heart size and mediastinal contours are within normal limits. Both lungs are clear.  IMPRESSION: Negative abdominal radiographs.  No acute cardiopulmonary disease.   Electronically Signed   By: Kathreen Devoid   On: 10/21/2014 10:38    ASSESSMENT/PLAN:   34 year old male with longstanding Crohn's disease, untreated. Patient "just deals with it". Admitted with pain and CTscan findings of colonic and ileal inflammation with possible fistula. Stool studies pending. His CRP is < 0.5, ESR only 4. Continue IV solumedrol for now.   ? [facility] [employer] - Hepatitis B,C serologies negative.( will arrange for Twinrix vaccine series in office). Quantiferon Gold (for TB) is pending.   He needs a colonoscopy for restaging of Crohn's.(can be done as outpt)  Will obtain TPMT enzyme assay as outpt once discharged.  When d/d"d by hospitalists, sHould go on prednisone 40 mg qd til seen in office (has appt Oct 4) then will taper by 5 mg per week.  Pt should stay on low residue diet.    LOS: 2 days  Naser Schuld, Deloris Ping 10/23/2014, Pager [phone]

## 2014-10-23 NOTE — Discharge Summary (Signed)
Physician Discharge Summary   Patient ID: Jeffery Mcintosh MRN: [id number] DOB/AGE: Dec 29, 1980 34 y.o.  Admit date: 10/21/2014 Discharge date: 10/23/2014  Primary Care Physician:  No PCP Per Patient  Discharge Diagnoses:    . Exacerbation of Crohn's disease . Crohn's disease . Diarrhea  Consults: Gastroenterology, Dr. Havery Moros   Recommendations for Outpatient Follow-up:  Patient was recommended to continue prednisone 40 mg daily until office appointment in taper per instructions  TESTS THAT NEED FOLLOW-UP None   DIET: Soft diet    Allergies:  No Known Allergies   Discharge Medications:   Medication List    STOP taking these medications        ondansetron 8 MG disintegrating tablet  Commonly known as:  ZOFRAN ODT      TAKE these medications        dicyclomine 10 MG capsule  Commonly known as:  BENTYL  Take 1 capsule (10 mg total) by mouth every 6 (six) hours as needed for spasms (cramping).     HYDROcodone-acetaminophen 5-325 MG per tablet  Commonly known as:  NORCO  Take 1 tablet by mouth every 6 (six) hours as needed for moderate pain.     MELATONIN PO  Take 1 tablet by mouth at bedtime as needed (for sleep).     pantoprazole 40 MG tablet  Commonly known as:  PROTONIX  Take 1 tablet (40 mg total) by mouth 2 (two) times daily.     predniSONE 10 MG tablet  Commonly known as:  DELTASONE  Take prednisone 86m daily until office appointment, then taper per instructions     promethazine 25 MG tablet  Commonly known as:  PHENERGAN  Take 1 tablet (25 mg total) by mouth every 6 (six) hours as needed for nausea or vomiting.     traZODone 50 MG tablet  Commonly known as:  DESYREL  Take 50 mg by mouth at bedtime as needed for sleep.         Brief H and P: For complete details please refer to admission H and P, but in brief Jeffery Mcintosh a 34y.o. male with known history of Crohn's disease since age [age]and history of depression presented to  the ER because of worsening abdominal pain with nausea vomiting and diarrhea. Patient had come to the ER 2 days ago and had CT abdomen and pelvis on 9/14 which shows intermediate changes around the terminal ileum and loops with some thickening of the ascending descending and cecum. Patient otherwise denied any fever or chills any recent travel or sick contacts or recent use of antibiotics. Denied any blood in the diarrhea. Patient was discharged home from ED 2 days ago with prednisone despite taking which patient still continued to have abdominal pain. Patient was admitted for possible Crohn's exacerbation  Hospital Course:  Exacerbation of Crohn's disease- improving. Patient has a long-standing history of Crohn's disease, untreated. CT abdomen and pelvis showed colonic and ileal inflammation with possible fistula. Stool studies were sent however patient's diarrhea resolved. Gastroenterology was consulted. Patient was started on IV Solu-Medrol, subsequently transitioned to oral prednisone. He is tolerating solid diet. KUB was negative for obstruction.   Per gastroenterology, patient has severe disease and wants immunosuppressive therapy with biologic agent. For now transitioned to prednisone, 40 mg till the office appointment followed by taper of 5 mg per week. Follow-up with Dr. SFuller Plan if his insurance covers it, recommended Remicade/Humira with either methotrexate or imuran. He will need also stating colonoscopy to be done  outpatient by gastroenterology. Hepatitis B, C serologies were negative, quantified and Gold for TB is still pending. He will be discharged home today.  Diarrhea - Improved  GERD - Patient was started on PPI for heartburn.   Day of Discharge BP 119/66 mmHg  Pulse 73  Temp(Src) 98.3 F (36.8 C) (Oral)  Resp 18  Ht 5' 7"  (1.702 m)  Wt 77.565 kg (171 lb)  BMI 26.78 kg/m2  SpO2 98%  Physical Exam: General: Alert and awake oriented x3 not in any acute distress. HEENT:  anicteric sclera, pupils reactive to light and accommodation CVS: S1-S2 clear no murmur rubs or gallops Chest: clear to auscultation bilaterally, no wheezing rales or rhonchi Abdomen: soft nontender, nondistended, normal bowel sounds Extremities: no cyanosis, clubbing or edema noted bilaterally Neuro: Cranial nerves II-XII intact, no focal neurological deficits   The results of significant diagnostics from this hospitalization (including imaging, microbiology, ancillary and laboratory) are listed below for reference.    LAB RESULTS: Basic Metabolic Panel:  Recent Labs Lab 10/22/14 0501 10/23/14 0346  NA 140 139  K 3.9 3.8  CL 105 102  CO2 26 30  GLUCOSE 106* 103*  BUN 7 9  CREATININE 0.86 0.87  CALCIUM 9.1 8.9   Liver Function Tests:  Recent Labs Lab 10/21/14 0016 10/21/14 0918  AST 22 25  ALT 21 20  ALKPHOS 89 86  BILITOT 0.5 0.4  PROT 7.2 7.7  ALBUMIN 4.0 3.9    Recent Labs Lab 10/18/14 1802 10/21/14 0016  LIPASE 28 22   No results for input(s): AMMONIA in the last 168 hours. CBC:  Recent Labs Lab 10/21/14 0918 10/22/14 0501 10/23/14 0346  WBC 13.2* 15.6* 10.4  NEUTROABS 12.2*  --   --   HGB 14.0 13.9 13.0  HCT 40.3 39.6 37.3*  MCV 84.0 84.6 83.8  PLT 287 253 233   Cardiac Enzymes: No results for input(s): CKTOTAL, CKMB, CKMBINDEX, TROPONINI in the last 168 hours. BNP: Invalid input(s): POCBNP CBG: No results for input(s): GLUCAP in the last 168 hours.  Significant Diagnostic Studies:  Dg Abd Acute W/chest  10/21/2014   CLINICAL DATA:  Per patient upper abdominal pressure and sharp pain in the lower part of his abdomen since Monday. Patient states that he has been vomiting and had diarrhea since Monday.  EXAM: DG ABDOMEN ACUTE W/ 1V CHEST  COMPARISON:  None.  FINDINGS: There is no evidence of dilated bowel loops or free intraperitoneal air. No radiopaque calculi or other significant radiographic abnormality is seen. Heart size and mediastinal  contours are within normal limits. Both lungs are clear.  IMPRESSION: Negative abdominal radiographs.  No acute cardiopulmonary disease.   Electronically Signed   By: Kathreen Devoid   On: 10/21/2014 10:38    2D ECHO:   Disposition and Follow-up:     Discharge Instructions    Diet - low sodium heart healthy    Complete by:  As directed      Increase activity slowly    Complete by:  As directed             DISPOSITION: Home   DISCHARGE FOLLOW-UP Follow-up Information    Follow up with Hvozdovic, Vita Barley, PA-C On 11/07/2014.   Specialty:  Physician Assistant   Why:  appt at 9:45, be there at 9:30   Contact information:   [address] [place] [address] [phone]        Time spent on Discharge: 35 mins   Signed:  Myrtha Tonkovich M.D. [facility] 10/23/2014, 11:53 AM Pager: [phone]

## 2014-10-23 NOTE — Telephone Encounter (Signed)
-----   Message from Vita Barley Hvozdovic, PA-C sent at 10/23/2014  9:49 AM EDT ----- Barbera Setters, this pt will likely go home today or tomorrow. Can you have him go for TPMT enzyme assay this week at [facility] lab? Also, he has appt with me on Oct 4 for f/u, but will need a colonoscopy shortly thereafter to stage disease. Can you see if there is a spot in [facility] with Dr Fuller Plan in mid Oct that you can hold for this pt? Thanks, Cecille Rubin

## 2014-10-24 LAB — QUANTIFERON IN TUBE
QUANTIFERON MITOGEN VALUE: 0.02 IU/mL
QUANTIFERON NIL VALUE: 0.02 [IU]/mL
QUANTIFERON TB AG VALUE: 0.01 IU/mL
QUANTIFERON TB GOLD: UNDETERMINED

## 2014-10-24 LAB — QUANTIFERON TB GOLD ASSAY (BLOOD)

## 2014-10-24 LAB — OVA AND PARASITE EXAMINATION: Ova and parasites: NONE SEEN

## 2014-10-25 ENCOUNTER — Other Ambulatory Visit: Payer: Medicare Other

## 2014-10-25 DIAGNOSIS — K50119 Crohn's disease of large intestine with unspecified complications: Secondary | ICD-10-CM

## 2014-10-25 LAB — GI PATHOGEN PANEL BY PCR, STOOL
C DIFFICILE TOXIN A/B: NOT DETECTED
CRYPTOSPORIDIUM BY PCR: NOT DETECTED
Campylobacter by PCR: NOT DETECTED
E COLI (ETEC) LT/ST: NOT DETECTED
E COLI (STEC): NOT DETECTED
E COLI 0157 BY PCR: NOT DETECTED
G lamblia by PCR: NOT DETECTED
Norovirus GI/GII: NOT DETECTED
ROTAVIRUS A BY PCR: NOT DETECTED
Salmonella by PCR: NOT DETECTED
Shigella by PCR: NOT DETECTED

## 2014-10-27 LAB — STOOL CULTURE

## 2014-10-31 ENCOUNTER — Other Ambulatory Visit: Payer: Medicare Other

## 2014-10-31 ENCOUNTER — Other Ambulatory Visit: Payer: Self-pay

## 2014-10-31 DIAGNOSIS — K5010 Crohn's disease of large intestine without complications: Secondary | ICD-10-CM | POA: Diagnosis not present

## 2014-11-02 LAB — THIOPURINE METHYLTRANSFERASE (TPMT), RBC: THIOPURINE METHYLTRANSFERASE, RBC: 14 nmol/h/mL

## 2014-11-06 DIAGNOSIS — K50919 Crohn's disease, unspecified, with unspecified complications: Secondary | ICD-10-CM | POA: Diagnosis not present

## 2014-11-06 DIAGNOSIS — Z09 Encounter for follow-up examination after completed treatment for conditions other than malignant neoplasm: Secondary | ICD-10-CM | POA: Diagnosis not present

## 2014-11-07 ENCOUNTER — Ambulatory Visit (HOSPITAL_COMMUNITY)
Admission: RE | Admit: 2014-11-07 | Discharge: 2014-11-07 | Disposition: A | Discharge status: Home / Self Care | Payer: Medicare Other | Source: Ambulatory Visit | Attending: Physician Assistant | Admitting: Physician Assistant

## 2014-11-07 ENCOUNTER — Other Ambulatory Visit (INDEPENDENT_AMBULATORY_CARE_PROVIDER_SITE_OTHER): Payer: Medicare Other

## 2014-11-07 ENCOUNTER — Ambulatory Visit (INDEPENDENT_AMBULATORY_CARE_PROVIDER_SITE_OTHER): Payer: Medicare Other | Admitting: Physician Assistant

## 2014-11-07 ENCOUNTER — Telehealth: Payer: Self-pay

## 2014-11-07 ENCOUNTER — Encounter: Payer: Self-pay | Admitting: Physician Assistant

## 2014-11-07 VITALS — BP 150/80 | HR 112 | Ht 67.0 in | Wt 172.0 lb

## 2014-11-07 DIAGNOSIS — K5090 Crohn's disease, unspecified, without complications: Secondary | ICD-10-CM | POA: Diagnosis not present

## 2014-11-07 DIAGNOSIS — R109 Unspecified abdominal pain: Secondary | ICD-10-CM

## 2014-11-07 DIAGNOSIS — K50118 Crohn's disease of large intestine with other complication: Secondary | ICD-10-CM

## 2014-11-07 DIAGNOSIS — Z23 Encounter for immunization: Secondary | ICD-10-CM | POA: Diagnosis not present

## 2014-11-07 DIAGNOSIS — R110 Nausea: Secondary | ICD-10-CM

## 2014-11-07 DIAGNOSIS — R1030 Lower abdominal pain, unspecified: Secondary | ICD-10-CM | POA: Diagnosis not present

## 2014-11-07 DIAGNOSIS — K6389 Other specified diseases of intestine: Secondary | ICD-10-CM | POA: Insufficient documentation

## 2014-11-07 LAB — LIPASE: Lipase: 29 U/L (ref 11.0–59.0)

## 2014-11-07 LAB — CBC WITH DIFFERENTIAL/PLATELET
Basophils Absolute: 0 K/uL (ref 0.0–0.1)
Basophils Relative: 0 % (ref 0.0–3.0)
Eosinophils Absolute: 0 K/uL (ref 0.0–0.7)
Eosinophils Relative: 0 % (ref 0.0–5.0)
HCT: 43.2 % (ref 39.0–52.0)
Hemoglobin: 14.7 g/dL (ref 13.0–17.0)
Lymphocytes Relative: 3.5 % — ABNORMAL LOW (ref 12.0–46.0)
Lymphs Abs: 0.5 K/uL — ABNORMAL LOW (ref 0.7–4.0)
MCHC: 34 g/dL (ref 30.0–36.0)
MCV: 87.9 fl (ref 78.0–100.0)
Monocytes Absolute: 0.3 K/uL (ref 0.1–1.0)
Monocytes Relative: 2.2 % — ABNORMAL LOW (ref 3.0–12.0)
Neutro Abs: 13.7 K/uL — ABNORMAL HIGH (ref 1.4–7.7)
Neutrophils Relative %: 94.3 % — ABNORMAL HIGH (ref 43.0–77.0)
Platelets: 294 K/uL (ref 150.0–400.0)
RBC: 4.91 Mil/uL (ref 4.22–5.81)
RDW: 15.4 % (ref 11.5–15.5)
WBC: 14.6 K/uL — ABNORMAL HIGH (ref 4.0–10.5)

## 2014-11-07 LAB — COMPREHENSIVE METABOLIC PANEL WITH GFR
ALT: 65 U/L — ABNORMAL HIGH (ref 0–53)
AST: 24 U/L (ref 0–37)
Albumin: 4.3 g/dL (ref 3.5–5.2)
Alkaline Phosphatase: 87 U/L (ref 39–117)
BUN: 11 mg/dL (ref 6–23)
CO2: 29 meq/L (ref 19–32)
Calcium: 9.7 mg/dL (ref 8.4–10.5)
Chloride: 101 meq/L (ref 96–112)
Creatinine, Ser: 0.89 mg/dL (ref 0.40–1.50)
GFR: 126.61 mL/min
Glucose, Bld: 126 mg/dL — ABNORMAL HIGH (ref 70–99)
Potassium: 4.3 meq/L (ref 3.5–5.1)
Sodium: 140 meq/L (ref 135–145)
Total Bilirubin: 0.3 mg/dL (ref 0.2–1.2)
Total Protein: 7.3 g/dL (ref 6.0–8.3)

## 2014-11-07 LAB — IBC PANEL
Iron: 88 ug/dL (ref 42–165)
SATURATION RATIOS: 24 % (ref 20.0–50.0)
TRANSFERRIN: 262 mg/dL (ref 212.0–360.0)

## 2014-11-07 LAB — FERRITIN: FERRITIN: 127.3 ng/mL (ref 22.0–322.0)

## 2014-11-07 LAB — THIOPURINE METHYLTRANSFERASE (TPMT), RBC: Thiopurine Methyltransferase, RBC: 16 nmol/h/mL

## 2014-11-07 LAB — HIGH SENSITIVITY CRP: CRP, High Sensitivity: 6.09 mg/L — ABNORMAL HIGH (ref 0.000–5.000)

## 2014-11-07 MED ORDER — NA SULFATE-K SULFATE-MG SULF 17.5-3.13-1.6 GM/177ML PO SOLN: 1.0000 | Freq: Once | ORAL | Status: DC

## 2014-11-07 MED ORDER — ONDANSETRON HCL 8 MG PO TABS: 8.0000 mg | ORAL_TABLET | Freq: Three times a day (TID) | ORAL | Status: DC | PRN

## 2014-11-07 MED ORDER — OMEPRAZOLE 20 MG PO CPDR: 20.0000 mg | DELAYED_RELEASE_CAPSULE | Freq: Every day | ORAL | Status: DC

## 2014-11-07 MED ORDER — HYDROCODONE-ACETAMINOPHEN 7.5-325 MG PO TABS: ORAL_TABLET | ORAL | Status: DC

## 2014-11-07 NOTE — Telephone Encounter (Signed)
-----   Message from Vita Barley Hvozdovic, PA-C sent at 11/07/2014 12:09 PM EDT ----- Patient was in today for follow-up of Crohn's. Chart reveals he was supposed to come on September 26 for a PPD because his quantiferon was indeterminate. It does not look like patient came. Could to please clarify this and if he has not set him up for a PPD ASAP.

## 2014-11-07 NOTE — Progress Notes (Signed)
Patient ID: Jeffery Mcintosh, male   DOB: [birth year], 34 y.o.   MRN: [id number]     History of Present Illness: Jeffery Mcintosh is a 34 year old male known to Dr. Fuller Plan. [name] reports that he was diagnosed with Crohn's disease at age 74 in [place], [place]. He states that he was hospitalized on 2 or 3 different occasions in [place] and treated with prednisone and another medication of which she does not know the name. He had been on medications regularly for 3 or 4 years and then was feeling well so he discontinued all meds. He then relocated from [place] at age 58 [place] 58 and had never been evaluated for Crohn's disease. He reports it through the years he's had intermittent bouts of crampy abdominal pain which he felt was due to his Crohn's and so he would back off on his diet until he episode past. He presented to the emergency room on September 14 with a several day history of abdominal pain, diarrhea, and nausea he was also having watery diarrhea. CT in the abdomen and pelvis revealed mild wall thickening of the descending colon as well as the cecum and ascending colon. Moderate wall thickening of the terminal ileum and adjacent ileal loops. Several of the thick walled ileal loops in the right lower quadrant appear to be matted together and it would be difficult to exclude an enteral enteric fistula in this location. He was admitted to the hospital and started on IV Solu-Medrol and subsequently transitioned to oral prednisone. It was felt that the patient had severe long-standing disease and immunosuppressive therapy as well as biologic agents were reviewed with the patient. Hepatitis A, B, and C serologies were negative and quantiferon gold TB was indeterminate which was felt to be due to his steroids. He was discharged home on prednisone 40 mg daily and returns today for follow-up. He states he was starting to feel okay but over the past several days is having 2-3 mushy to watery bowel  movements. He has waves of nausea not affected by food he has had no vomiting. He states he has. Umbilical abdominal pain. He has tried Bentyl without relief. He feels Vicodin provides him some relief. He has been using Phenergan for nausea but feels it doesn't help it just makes him sleepy. He has had no blood with his bowel movements.   Past Medical History  Diagnosis Date  . Arthritis   . Depression   . GERD (gastroesophageal reflux disease)   . Hypertension   . Crohn's disease ([place]) history of  . Bipolar affective ([place])   . Schizophrenic disorder ([place])   . Anxiety   . Chronic headaches     History reviewed. No pertinent past surgical history. Family History  Problem Relation Age of Onset  . Hypertension Mother   . Alcohol abuse Father   . Mental illness Father   . Arthritis Maternal Grandmother   . Hypertension Maternal Grandmother   . Heart disease Maternal Grandmother   . Hyperlipidemia Maternal Grandfather   . Stroke Paternal Grandmother   . Kidney disease Maternal Grandmother   . Diabetes Maternal Grandmother   . Cancer Maternal Grandmother     great, type unknown  . Colon polyps Maternal Grandfather   . Ulcerative colitis Maternal Aunt   . Multiple sclerosis Father   . Heart attack Mother    Social History  Substance Use Topics  . Smoking status: Current Every Day Smoker  . Smokeless tobacco: Never Used  .  Alcohol Use: No   Current Outpatient Prescriptions  Medication Sig Dispense Refill  . dicyclomine (BENTYL) 10 MG capsule Take 1 capsule (10 mg total) by mouth every 6 (six) hours as needed for spasms (cramping). 90 capsule 1  . HYDROcodone-acetaminophen (NORCO) 7.5-325 MG tablet Take one capsule by mouth every 12 hours as needed 16 tablet 0  . MELATONIN PO Take 1 tablet by mouth at bedtime as needed (for sleep).    . Na Sulfate-K Sulfate-Mg Sulf SOLN Take 1 kit by mouth once. 354 mL 0  . omeprazole (PRILOSEC) 20 MG capsule Take 1 capsule (20 mg total) by  mouth daily. 30 capsule 3  . ondansetron (ZOFRAN) 8 MG tablet Take 1 tablet (8 mg total) by mouth every 8 (eight) hours as needed for nausea or vomiting. 60 tablet 2  . pantoprazole (PROTONIX) 40 MG tablet Take 1 tablet (40 mg total) by mouth 2 (two) times daily. 60 tablet 3  . predniSONE (DELTASONE) 10 MG tablet Take prednisone 33m daily until office appointment, then taper per instructions 120 tablet 0  . promethazine (PHENERGAN) 25 MG tablet Take 1 tablet (25 mg total) by mouth every 6 (six) hours as needed for nausea or vomiting. 60 tablet 0  . traZODone (DESYREL) 50 MG tablet Take 50 mg by mouth at bedtime as needed for sleep.     No current facility-administered medications for this visit.   No Known Allergies   Review of Systems: Per history of present illness otherwise negative.     Studies:   Ct Abdomen Pelvis W Contrast  10/19/2014   CLINICAL DATA:  Diffuse mid to lower abdominal pain radiating into testicles with nausea, vomiting and diarrhea 4 days. History of Crohn's disease.  EXAM: CT ABDOMEN AND PELVIS WITH CONTRAST  TECHNIQUE: Multidetector CT imaging of the abdomen and pelvis was performed using the standard protocol following bolus administration of intravenous contrast.  CONTRAST:  100 mL Omnipaque 300 IV  COMPARISON:  None.  FINDINGS: Lung bases are normal.  Abdominal images demonstrate focal fatty infiltration of the liver adjacent the gallbladder fossa. The spleen, pancreas, gallbladder and adrenal glands are normal. Kidneys are normal in size without hydronephrosis or nephrolithiasis. Ureters are normal.  There is mild wall thickening of the ascending colon/ cecum with moderate wall thickening of the terminal ileum and adjacent ileal loops. Findings are compatible patient's known Crohn's disease. Minimal free fluid over the right lower quadrant. Several thick-walled small bowel loops in the right lower quadrant appear to be somewhat matted together as it would be  difficult to exclude an enteroenteric fistula in this location. There is minimal wall thickening of the descending colon. The appendix appears within normal. Remainder of the small bowel and colon are unremarkable.  Vascular structures are within normal.  Pelvic images demonstrate the bladder, prostate and rectum to be within normal. There is no free fluid. Remaining bones and soft tissues are within normal.  IMPRESSION: Mild wall thickening of the descending colon as well as the cecum and ascending colon. Moderate wall thickening of the terminal ileum and adjacent ileal loops as these findings are compatible with active inflammation of patient's known Crohn's disease. Minimal associated free fluid. Several of the thick-walled ileal loops in the right lower quadrant appear to be matted together as it would be difficult to exclude an enteroenteric fistula in this location.  Focal fatty change over the liver adjacent the gallbladder fossa.   Electronically Signed   By: DMarin OlpM.D.  On: 10/19/2014 00:03   Dg Abd Acute W/chest  10/21/2014   CLINICAL DATA:  Per patient upper abdominal pressure and sharp pain in the lower part of his abdomen since Monday. Patient states that he has been vomiting and had diarrhea since Monday.  EXAM: DG ABDOMEN ACUTE W/ 1V CHEST  COMPARISON:  None.  FINDINGS: There is no evidence of dilated bowel loops or free intraperitoneal air. No radiopaque calculi or other significant radiographic abnormality is seen. Heart size and mediastinal contours are within normal limits. Both lungs are clear.  IMPRESSION: Negative abdominal radiographs.  No acute cardiopulmonary disease.   Electronically Signed   By: Kathreen Devoid   On: 10/21/2014 10:38     Physical Exam: BP 150/80 mmHg  Pulse 112  Ht 5' 7"  (1.702 m)  Wt 172 lb (78.019 kg)  BMI 26.93 kg/m2 General: Pleasant, well developed ,male in no acute distress Head: Normocephalic and atraumatic Eyes:  sclerae anicteric, conjunctiva  pink  Ears: Normal auditory acuity Lungs: Clear throughout to auscultation Heart: Regular rate and rhythm Abdomen: Soft, non distended, mild right-sided abdominal tenderness with no rebound or guarding, No masses, no hepatomegaly. Normal bowel sounds Musculoskeletal: Symmetrical with no gross deformities  Extremities: No edema  Neurological: Alert oriented x 4, grossly nonfocal Psychological:  Alert and cooperative. Normal mood and affect  Assessment and Recommendations: 34 year old male with a long-standing history of Crohn's status post recent admission for exacerbation, here for follow-up. Patient has been off medications for many years. He is currently on prednisone 40 mg daily which he will continue until Friday, and then on Saturday October rate decrease by 5 mg weekly until complete. He will use Odansetron 8 mg 1 by mouth every 8 hours when necessary nausea. He will also use omeprazole 20 mg 1 by mouth every morning 30 minutes prior to breakfast. He has been given a prescription for Vicodin 7.5/325 one by mouth every 12 hours when necessary pain #16 with no refill and was advised that he would not receive further pain medicines from this office, all pain meds should come from his primary care provider's office. He had a TPMT  drawn several days ago which is pending. A CBC, comprehensive metabolic panel, lipase, C-reactive protein, and iron panel will be drawn today. A 2 view abdominal film will be obtained today to to assess for air-fluid levels, obstruction, etc. Patient will be scheduled for colonoscopy to assess disease activity.The risks, benefits, and alternatives to colonoscopy with possible biopsy and possible polypectomy were discussed with the patient and they consent to proceed. Patient will remain on a low residue diet. Further recommendations will be made pending the findings of the above. Patient was also started on the Twinrix vaccination series today.        Sada Mazzoni, Vita Barley  PA-C 11/07/2014,

## 2014-11-07 NOTE — Patient Instructions (Addendum)
Your physician has requested that you go to the basement for lab work before leaving today.  We have sent the following medications to your pharmacy for you to pick up at your convenience:omeprazole, zofran. We have printed your prescription for hydrocodone.  Lori Hvozdovic, PA has advised that you be on a prednisone taper. The taper instructions are as follows: continue 4 tablets by mouth until Friday, then Saturday start 10 mg 3.5 tablets x 7 days, then 3 tablets x 7 days, 2.5 tablets x 7 days, 2 tablets x 7 days, 1.2 tablets x 7 days, then 1 tablet x 7 days, 1/2 tablet x 7 days then stop.   Please go directly to [facility] 1st floor of the hospital for your abdominal x-ray.   You have been scheduled for a colonoscopy. Please follow written instructions given to you at your visit today.  Please pick up your prep supplies at the pharmacy within the next 1-3 days. If you use inhalers (even only as needed), please bring them with you on the day of your procedure.  You have been given your Twinrix  Injection. The next injection is on 11/13/14 at 9:00am. Please come the this floor for your nurse visit.

## 2014-11-07 NOTE — Telephone Encounter (Signed)
Explained to patient he needs PPD along with his Twinrix injection on 11/13/14 since he never had it drawn in September. Pt agreed to have it drawn on that day also. Order placed.

## 2014-11-07 NOTE — Progress Notes (Signed)
Reviewed and agree with management plan.  Charrie Mcconnon T. Kimiyo Carmicheal, MD FACG 

## 2014-11-14 ENCOUNTER — Telehealth: Payer: Self-pay | Admitting: Physician Assistant

## 2014-11-14 ENCOUNTER — Ambulatory Visit (INDEPENDENT_AMBULATORY_CARE_PROVIDER_SITE_OTHER): Payer: Medicare Other | Admitting: Gastroenterology

## 2014-11-14 DIAGNOSIS — Z23 Encounter for immunization: Secondary | ICD-10-CM

## 2014-11-14 DIAGNOSIS — K50918 Crohn's disease, unspecified, with other complication: Secondary | ICD-10-CM

## 2014-11-14 NOTE — Telephone Encounter (Signed)
Left a message patient to call back.

## 2014-11-14 NOTE — Telephone Encounter (Signed)
Spoke with patient's wife and told her any pain medications will have to come from his PCP per  Last OV  Note.

## 2014-11-15 ENCOUNTER — Encounter: Payer: Self-pay | Admitting: Family

## 2014-11-15 ENCOUNTER — Ambulatory Visit (INDEPENDENT_AMBULATORY_CARE_PROVIDER_SITE_OTHER): Payer: Medicare Other | Admitting: Family

## 2014-11-15 VITALS — BP 122/90 | HR 94 | Temp 97.7°F | Resp 18 | Ht 67.0 in | Wt 172.8 lb

## 2014-11-15 DIAGNOSIS — K50918 Crohn's disease, unspecified, with other complication: Secondary | ICD-10-CM | POA: Diagnosis not present

## 2014-11-15 DIAGNOSIS — R200 Anesthesia of skin: Secondary | ICD-10-CM | POA: Diagnosis not present

## 2014-11-15 DIAGNOSIS — R202 Paresthesia of skin: Secondary | ICD-10-CM

## 2014-11-15 MED ORDER — MEPERIDINE HCL 25 MG/ML IJ SOLN
25.0000 mg | Freq: Once | INTRAMUSCULAR | Status: AC
  Administered 2014-11-15: 25 mg via INTRAVENOUS

## 2014-11-15 MED ORDER — MEPERIDINE HCL 25 MG/ML IJ SOLN
25.0000 mg | Freq: Once | INTRAMUSCULAR | Status: AC
  Administered 2014-11-15: 25 mg via INTRAMUSCULAR

## 2014-11-15 NOTE — Assessment & Plan Note (Signed)
Symptoms and exam consistent with generalized abdominal pain most likely related to a Crohn's disease exacerbation for which she is currently under the care of gastroenterology. Advised to follow-up with gastroenterology for worsening symptoms. [place] controlled substance database reviewed with evidence of previous prescription for a 15 day supply of narcotics and has yet to fill previous prescription by gastroenterology secondary to pharmacy inability to fill due to time restrictions. In office injection of Demerol given for pain control. Advised to fill prescription of previously prescribed narcotics when able. Follow-up with gastroenterology as scheduled or sooner if symptoms worsen.

## 2014-11-15 NOTE — Progress Notes (Signed)
Subjective:    Patient ID: Jeffery Mcintosh, male    DOB: 07/02/1980, 34 y.o.   MRN: [id number]  Chief Complaint  Patient presents with  . Establish Care    stomach pain, starts at the waist line of the abdomen and then has a burning sensation at the top of his stomach, diarrhea x3 days also has hand numbness in his left has that is half of his hand and goes up his arm, it has been numb for a week    HPI:  Jeffery Mcintosh is a 34 y.o. male who  has a past medical history of Arthritis; Depression; GERD (gastroesophageal reflux disease); Hypertension; Crohn's disease ([place]) (history of); Bipolar affective ([place]); Schizophrenic disorder ([place]); Anxiety; and Chronic headaches. and presents today for an office visit to establish are.    1.) Stomach pains - Associated symptom of pain located throughout his abdomen that is described as a burning sensation on the upper aspect of his abdomen and and sharp pain on the lower aspect of his abdomen. Recently admitted to the hospital and followed up by gastroenterology for an exacerbation of his crohn's colitis. Notes 5-6 bowel movements have been diarrhea. Severity of the pain is a 9/10. Modifying factors include hydrocodone-acetaminophen and the omeprazole. Eating, stress, aggravation all make the pain worse. Notes the only thing that makes the pain bearable is the hydrocodone-acetaminophen. Is scheduled to undergo a colonoscopy on 10/18 and possible start [place] following the procedure.   2.) Numbness in arm - Associated symptom of numbness and tingling in his left arm in the ulnar nerve distribution has been going on for about a week. Described as tingling. Denies weakness. Denies trauma or sounds or sensations heard or felt. Denies any treatments or modifying factors that make it worse.   No Known Allergies   Outpatient Prescriptions Prior to Visit  Medication Sig Dispense Refill  . dicyclomine (BENTYL) 10 MG capsule Take 1 capsule (10 mg total)  by mouth every 6 (six) hours as needed for spasms (cramping). 90 capsule 1  . MELATONIN PO Take 1 tablet by mouth at bedtime as needed (for sleep).    Marland Kitchen omeprazole (PRILOSEC) 20 MG capsule Take 1 capsule (20 mg total) by mouth daily. 30 capsule 3  . ondansetron (ZOFRAN) 8 MG tablet Take 1 tablet (8 mg total) by mouth every 8 (eight) hours as needed for nausea or vomiting. 60 tablet 2  . predniSONE (DELTASONE) 10 MG tablet Take prednisone 46m daily until office appointment, then taper per instructions 120 tablet 0  . promethazine (PHENERGAN) 25 MG tablet Take 1 tablet (25 mg total) by mouth every 6 (six) hours as needed for nausea or vomiting. 60 tablet 0  . traZODone (DESYREL) 50 MG tablet Take 50 mg by mouth at bedtime as needed for sleep.    .Marland KitchenHYDROcodone-acetaminophen (NORCO) 7.5-325 MG tablet Take one capsule by mouth every 12 hours as needed 16 tablet 0  . Na Sulfate-K Sulfate-Mg Sulf SOLN Take 1 kit by mouth once. 354 mL 0  . pantoprazole (PROTONIX) 40 MG tablet Take 1 tablet (40 mg total) by mouth 2 (two) times daily. 60 tablet 3   No facility-administered medications prior to visit.     Past Medical History  Diagnosis Date  . Arthritis   . Depression   . GERD (gastroesophageal reflux disease)   . Hypertension   . Crohn's disease ([place] history of  . Bipolar affective ([place]   . Schizophrenic disorder ([place]   . Anxiety   .  Chronic headaches      History reviewed. No pertinent past surgical history.   Family History  Problem Relation Age of Onset  . Hypertension Mother   . Heart attack Mother   . Stroke Mother   . Alcohol abuse Father   . Mental illness Father   . Multiple sclerosis Father   . Arthritis Maternal Grandmother   . Hypertension Maternal Grandmother   . Heart disease Maternal Grandmother   . Kidney disease Maternal Grandmother   . Diabetes Maternal Grandmother   . Cancer Maternal Grandmother     great, type unknown  . Hyperlipidemia Maternal Grandfather    . Colon polyps Maternal Grandfather   . Stroke Paternal Grandmother   . Ulcerative colitis Maternal Aunt      Social History   Social History  . Marital Status: Single    Spouse Name: N/A  . Number of Children: 2  . Years of Education: 12   Occupational History  . disabled     Crohn's / Bipolar / Schizophrenia   Social History Main Topics  . Smoking status: Former Smoker    Quit date: 10/16/2014  . Smokeless tobacco: Never Used  . Alcohol Use: No  . Drug Use: No  . Sexual Activity: Not on file   Other Topics Concern  . Not on file   Social History Narrative     Review of Systems  Constitutional: Negative for fever and chills.  Cardiovascular: Negative for chest pain, palpitations and leg swelling.  Gastrointestinal: Positive for nausea, abdominal pain and diarrhea. Negative for vomiting and blood in stool.  Neurological: Positive for numbness. Negative for dizziness and weakness.  Psychiatric/Behavioral: Positive for sleep disturbance.      Objective:    BP 122/90 mmHg  Pulse 94  Temp(Src) 97.7 F (36.5 C) (Oral)  Resp 18  Ht _0  (1.702 m)  Wt 172 lb 12.8 oz (78.382 kg)  BMI 27.06 kg/m2  SpO2 97% Nursing note and vital signs reviewed.  Physical Exam  Constitutional: He is oriented to person, place, and time. He appears well-developed and well-nourished. No distress.  Cardiovascular: Normal rate, regular rhythm, normal heart sounds and intact distal pulses.   Pulmonary/Chest: Effort normal and breath sounds normal.  Abdominal: Soft. Normal appearance and bowel sounds are normal. He exhibits no distension. There is no hepatosplenomegaly. There is generalized tenderness. There is no rigidity, no rebound, no guarding, no tenderness at McBurney's point and negative Murphy's sign.  Musculoskeletal:  Left upper extremity - nose deformity, discoloration, or edema noted. Numbness/tingling noted along the ulnar nerve distribution below elbow. Distal pulses and  capillary refill are intact and appropriate. Strength is normal. Positive Tinel sign.  Neurological: He is alert and oriented to person, place, and time.  Skin: Skin is warm and dry.  Psychiatric: He has a normal mood and affect. His behavior is normal. Judgment and thought content normal.       Assessment & Plan:   Problem List Items Addressed This Visit      Digestive   Crohn's disease ([place]) - Primary    Symptoms and exam consistent with generalized abdominal pain most likely related to a Crohn's disease exacerbation for which she is currently under the care of gastroenterology. Advised to follow-up with gastroenterology for worsening symptoms. [place] controlled substance database reviewed with evidence of previous prescription for a 15 day supply of narcotics and has yet to fill previous prescription by gastroenterology secondary to pharmacy inability to fill due to  time restrictions. In office injection of Demerol given for pain control. Advised to fill prescription of previously prescribed narcotics when able. Follow-up with gastroenterology as scheduled or sooner if symptoms worsen.      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 Treat conservatively with ice and wrist stretching. Avoid compression of the area. Currently on prednisone for Crohn's disease. If symptoms worsen or fail to improve, consider further evaluation including nerve conduction study with neurology or further evaluation by hand specialist.

## 2014-11-15 NOTE — Assessment & Plan Note (Signed)
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 Treat conservatively with ice and wrist stretching. Avoid compression of the area. Currently on prednisone for Crohn's disease. If symptoms worsen or fail to improve, consider further evaluation including nerve conduction study with neurology or further evaluation by hand specialist.

## 2014-11-15 NOTE — Progress Notes (Signed)
Pre visit review using our clinic review tool, if applicable. No additional management support is needed unless otherwise documented below in the visit note. 

## 2014-11-15 NOTE — Patient Instructions (Signed)
Thank you for choosing [facility].  Summary/Instructions:  Please follow up with Gastroenterology for your Crohn's.   For your arm, ice as needed. If symptoms worsen or fail to improve we will send you to neurology for nerve conduction study.  Your prescription(s) have been submitted to your pharmacy or been printed and provided for you. Please take as directed and contact our office if you believe you are having problem(s) with the medication(s) or have any questions.  If your symptoms worsen or fail to improve, please contact our office for further instruction, or in case of emergency go directly to the emergency room at the closest medical facility.   Colitis Colitis is inflammation of the colon. Colitis may last a short time (acute) or it may last a long time (chronic). CAUSES This condition may be caused by:  Viruses.  Bacteria.  Reactions to medicine.  Certain autoimmune diseases, such as Crohn disease or ulcerative colitis. SYMPTOMS Symptoms of this condition include:  Diarrhea.  Passing bloody or tarry stool.  Pain.  Fever.  Vomiting.  Tiredness (fatigue).  Weight loss.  Bloating.  Sudden increase in abdominal pain.  Having fewer bowel movements than usual. DIAGNOSIS This condition is diagnosed with a stool test or a blood test. You may also have other tests, including X-rays, a CT scan, or a colonoscopy. TREATMENT Treatment may include:  Resting the bowel. This involves not eating or drinking for a period of time.  Fluids that are given through an IV tube.  Medicine for pain and diarrhea.  Antibiotic medicines.  Cortisone medicines.  Surgery. HOME CARE INSTRUCTIONS Eating and Drinking  Follow instructions from your health care provider about eating or drinking restrictions.  Drink enough fluid to keep your urine clear or pale yellow.  Work with a dietitian to determine which foods cause your condition to flare up.  Avoid foods  that cause flare-ups.  Eat a well-balanced diet. Medicines  Take over-the-counter and prescription medicines only as told by your health care provider.  If you were prescribed an antibiotic medicine, take it as told by your health care provider. Do not stop taking the antibiotic even if you start to feel better. General Instructions  Keep all follow-up visits as told by your health care provider. This is important. SEEK MEDICAL CARE IF:  Your symptoms do not go away.  You develop new symptoms. SEEK IMMEDIATE MEDICAL CARE IF:  You have a fever that does not go away with treatment.  You develop chills.  You have extreme weakness, fainting, or dehydration.  You have repeated vomiting.  You develop severe pain in your abdomen.  You pass bloody or tarry stool.   This information is not intended to replace advice given to you by your health care provider. Make sure you discuss any questions you have with your health care provider.   Document Released: 02/28/2004 Document Revised: 10/11/2014 Document Reviewed: 05/15/2014 Elsevier Interactive Patient Education [employer].

## 2014-11-16 ENCOUNTER — Emergency Department (HOSPITAL_COMMUNITY)
Admission: EM | Admit: 2014-11-16 | Discharge: 2014-11-16 | Disposition: A | Discharge status: Home / Self Care | Payer: Medicare Other | Attending: Emergency Medicine | Admitting: Emergency Medicine

## 2014-11-16 ENCOUNTER — Telehealth: Payer: Self-pay | Admitting: Family

## 2014-11-16 ENCOUNTER — Emergency Department (HOSPITAL_COMMUNITY): Payer: Medicare Other

## 2014-11-16 ENCOUNTER — Encounter (HOSPITAL_COMMUNITY): Payer: Self-pay | Admitting: Emergency Medicine

## 2014-11-16 DIAGNOSIS — I10 Essential (primary) hypertension: Secondary | ICD-10-CM | POA: Insufficient documentation

## 2014-11-16 DIAGNOSIS — Z8719 Personal history of other diseases of the digestive system: Secondary | ICD-10-CM | POA: Insufficient documentation

## 2014-11-16 DIAGNOSIS — Z87891 Personal history of nicotine dependence: Secondary | ICD-10-CM | POA: Diagnosis not present

## 2014-11-16 DIAGNOSIS — Z8659 Personal history of other mental and behavioral disorders: Secondary | ICD-10-CM | POA: Diagnosis not present

## 2014-11-16 DIAGNOSIS — F419 Anxiety disorder, unspecified: Secondary | ICD-10-CM | POA: Insufficient documentation

## 2014-11-16 DIAGNOSIS — R509 Fever, unspecified: Secondary | ICD-10-CM | POA: Diagnosis not present

## 2014-11-16 DIAGNOSIS — Z7952 Long term (current) use of systemic steroids: Secondary | ICD-10-CM | POA: Diagnosis not present

## 2014-11-16 DIAGNOSIS — K219 Gastro-esophageal reflux disease without esophagitis: Secondary | ICD-10-CM | POA: Diagnosis not present

## 2014-11-16 DIAGNOSIS — Z8739 Personal history of other diseases of the musculoskeletal system and connective tissue: Secondary | ICD-10-CM | POA: Insufficient documentation

## 2014-11-16 DIAGNOSIS — R109 Unspecified abdominal pain: Secondary | ICD-10-CM | POA: Diagnosis not present

## 2014-11-16 DIAGNOSIS — G8929 Other chronic pain: Secondary | ICD-10-CM | POA: Diagnosis not present

## 2014-11-16 DIAGNOSIS — F319 Bipolar disorder, unspecified: Secondary | ICD-10-CM | POA: Diagnosis not present

## 2014-11-16 DIAGNOSIS — R1084 Generalized abdominal pain: Secondary | ICD-10-CM | POA: Insufficient documentation

## 2014-11-16 DIAGNOSIS — Z79899 Other long term (current) drug therapy: Secondary | ICD-10-CM | POA: Insufficient documentation

## 2014-11-16 LAB — CBC
HCT: 41.5 % (ref 39.0–52.0)
HEMOGLOBIN: 14.6 g/dL (ref 13.0–17.0)
MCH: 30.2 pg (ref 26.0–34.0)
MCHC: 35.2 g/dL (ref 30.0–36.0)
MCV: 85.9 fL (ref 78.0–100.0)
PLATELETS: 243 10*3/uL (ref 150–400)
RBC: 4.83 MIL/uL (ref 4.22–5.81)
RDW: 15.5 % (ref 11.5–15.5)
WBC: 11.1 10*3/uL — AB (ref 4.0–10.5)

## 2014-11-16 LAB — COMPREHENSIVE METABOLIC PANEL
ALT: 32 U/L (ref 17–63)
AST: 18 U/L (ref 15–41)
Albumin: 3.6 g/dL (ref 3.5–5.0)
Alkaline Phosphatase: 83 U/L (ref 38–126)
Anion gap: 8 (ref 5–15)
BUN: 9 mg/dL (ref 6–20)
CHLORIDE: 102 mmol/L (ref 101–111)
CO2: 29 mmol/L (ref 22–32)
CREATININE: 0.99 mg/dL (ref 0.61–1.24)
Calcium: 9.1 mg/dL (ref 8.9–10.3)
Glucose, Bld: 133 mg/dL — ABNORMAL HIGH (ref 65–99)
POTASSIUM: 4.4 mmol/L (ref 3.5–5.1)
SODIUM: 139 mmol/L (ref 135–145)
Total Bilirubin: 0.7 mg/dL (ref 0.3–1.2)
Total Protein: 6.5 g/dL (ref 6.5–8.1)

## 2014-11-16 LAB — URINALYSIS, ROUTINE W REFLEX MICROSCOPIC
Bilirubin Urine: NEGATIVE
GLUCOSE, UA: NEGATIVE mg/dL
Hgb urine dipstick: NEGATIVE
KETONES UR: NEGATIVE mg/dL
LEUKOCYTES UA: NEGATIVE
NITRITE: NEGATIVE
PH: 7.5 (ref 5.0–8.0)
Protein, ur: NEGATIVE mg/dL
SPECIFIC GRAVITY, URINE: 1.014 (ref 1.005–1.030)
Urobilinogen, UA: 1 mg/dL (ref 0.0–1.0)

## 2014-11-16 LAB — LIPASE, BLOOD: LIPASE: 82 U/L — AB (ref 22–51)

## 2014-11-16 LAB — I-STAT CG4 LACTIC ACID, ED: Lactic Acid, Venous: 1.86 mmol/L (ref 0.5–2.0)

## 2014-11-16 MED ORDER — OXYCODONE-ACETAMINOPHEN 5-325 MG PO TABS: 1.0000 | ORAL_TABLET | ORAL | Status: DC | PRN

## 2014-11-16 MED ORDER — HYDROMORPHONE HCL 1 MG/ML IJ SOLN
1.0000 mg | Freq: Once | INTRAMUSCULAR | Status: AC
  Administered 2014-11-16: 1 mg via INTRAVENOUS
  Filled 2014-11-16: qty 1

## 2014-11-16 MED ORDER — PREDNISONE 20 MG PO TABS: 40.0000 mg | ORAL_TABLET | Freq: Every day | ORAL | Status: DC

## 2014-11-16 MED ORDER — ONDANSETRON HCL 4 MG/2ML IJ SOLN
4.0000 mg | Freq: Once | INTRAMUSCULAR | Status: AC
  Administered 2014-11-16: 4 mg via INTRAVENOUS
  Filled 2014-11-16: qty 2

## 2014-11-16 MED ORDER — SODIUM CHLORIDE 0.9 % IV BOLUS (SEPSIS)
1000.0000 mL | Freq: Once | INTRAVENOUS | Status: AC
  Administered 2014-11-16: 1000 mL via INTRAVENOUS

## 2014-11-16 MED ORDER — ONDANSETRON 4 MG PO TBDP: 4.0000 mg | ORAL_TABLET | Freq: Three times a day (TID) | ORAL | Status: DC | PRN

## 2014-11-16 NOTE — Telephone Encounter (Signed)
[facility]  Patient Name: Jeffery Mcintosh  DOB: 1980/06/03    Initial Comment Caller states he woke up soaking and wet, and having severe abdominal pain. Doesn't know his phone number.   Nurse Assessment  Nurse: Kenton Kingfisher, RN, Me[place] Date/Time (Eastern Time): 11/16/2014 12:51:37 PM  Confirm and document reason for call. If symptomatic, describe symptoms. ---Caller states he woke up with severe abdominal pain and soaking wet. Caller states he has been on mediation for Chron's Disease and has a colonoscopy scheduled for October 18th or 19th. Unsure of fever. No headache. Caller states feels weak and sweaty.  Has the patient traveled out of the country within the last 30 days? ---No  Does the patient have any new or worsening symptoms? ---Yes  Will a triage be completed? ---Yes  Related visit to physician within the last 2 weeks? ---Yes  Does the PT have any chronic conditions? (i.e. diabetes, asthma, etc.) ---Yes  List chronic conditions. ---Chron's Disease     Guidelines    Guideline Title Affirmed Question Affirmed Notes  Abdominal Pain - Male Shock suspected (e.g., cold/pale/clammy skin, too weak to stand, low BP, rapid pulse)    Final Disposition User   Call EMS 911 Now Kenton Kingfisher, RN, Meagan    Disagree/Comply: Comply

## 2014-11-16 NOTE — ED Provider Notes (Signed)
CSN: [id number]     Arrival date & time 11/16/14  1343 History   First MD Initiated Contact with Patient 11/16/14 1515     Chief Complaint  Patient presents with  . Abdominal Pain   Jeffery Mcintosh is a 34 y.o. male with a past medical history significant for Crohn's disease, hypertension, anxiety, and bipolar disorder who presents for abdominal pain, subjective fevers, nausea, and diarrhea. The patient reports that he was recently admitted for a Crohn's flare impression a 3 weeks ago. The patient said he did well after discharge however, for the last 3 days, he has had gradually worsening symptoms. The patient says he has had soaking chills and sweats as well as subjective fevers. The patient reports nausea but no episodes of vomiting. The patient describes having multiple doses of diarrhea that he did not recognize as bloody. The patient is coming by his family reports that he is currently on a steroid taper for his recent flare. The patient says that his oral pain medication has been unable to help his abdominal pain today. The patient describes the pain is located over his abdomen, radiating towards his epigastrium and towards his groin. The pain as a 10 out of 10 in severity, constant, and "the same as my Crohn's flares". The patient says this is worse than the pain he had when he was recently admitted. The patient denies any chest pain, shortness of breath, constipation, dysuria, or change in urine. The patient says that he has had some numbness and tingling in his left arm as well as some occasional tingling in his right arm. The patient says this been a problem for "a while".   (Consider location/radiation/quality/duration/timing/severity/associated sxs/prior Treatment) Patient is a 34 y.o. male presenting with abdominal pain. The history is provided by the patient, the spouse and medical records. No language interpreter was used.  Abdominal Pain Pain location:  Generalized Pain quality:  aching, cramping and sharp   Pain radiates to:  Groin and epigastric region Pain severity:  Severe Onset quality:  Gradual Duration:  2 days Timing:  Constant Progression:  Waxing and waning Chronicity:  Recurrent Context: recent illness (recentn crohns flare)   Context: not previous surgeries   Relieved by:  Nothing Worsened by:  Nothing tried Ineffective treatments:  None tried Associated symptoms: chills, diarrhea, fever (subjective) and nausea   Associated symptoms: no chest pain, no constipation, no dysuria, no hematuria, no shortness of breath and no vomiting   Diarrhea:    Quality:  Watery   Number of occurrences:  Numerous   Severity:  Moderate   Duration:  2 days   Timing:  Intermittent   Progression:  Unchanged Fever:    Duration:  2 days   Timing:  Intermittent   Temp source:  Subjective Risk factors: obesity and recent hospitalization (recent admission for crohns flare 3 weeks ago)     Past Medical History  Diagnosis Date  . Arthritis   . Depression   . GERD (gastroesophageal reflux disease)   . Hypertension   . Crohn's disease ([place]) history of  . Bipolar affective ([place])   . Schizophrenic disorder ([place])   . Anxiety   . Chronic headaches    History reviewed. No pertinent past surgical history. Family History  Problem Relation Age of Onset  . Hypertension Mother   . Heart attack Mother   . Stroke Mother   . Alcohol abuse Father   . Mental illness Father   . Multiple sclerosis Father   .  Arthritis Maternal Grandmother   . Hypertension Maternal Grandmother   . Heart disease Maternal Grandmother   . Kidney disease Maternal Grandmother   . Diabetes Maternal Grandmother   . Cancer Maternal Grandmother     great, type unknown  . Hyperlipidemia Maternal Grandfather   . Colon polyps Maternal Grandfather   . Stroke Paternal Grandmother   . Ulcerative colitis Maternal Aunt    Social History  Substance Use Topics  . Smoking status: Former Smoker    Quit  date: 10/16/2014  . Smokeless tobacco: Never Used  . Alcohol Use: No    Review of Systems  Constitutional: Positive for fever (subjective) and chills. Negative for diaphoresis.  HENT: Negative for congestion.   Respiratory: Negative for chest tightness, shortness of breath and wheezing.   Cardiovascular: Negative for chest pain and palpitations.  Gastrointestinal: Positive for nausea, abdominal pain and diarrhea. Negative for vomiting, constipation, blood in stool and rectal pain.  Genitourinary: Negative for dysuria, frequency, hematuria and flank pain.  Musculoskeletal: Negative for back pain.  Skin: Negative for rash and wound.  Neurological: Negative for dizziness, weakness and headaches.  Psychiatric/Behavioral: Negative for confusion and agitation.  All other systems reviewed and are negative.     Allergies  Review of patient's allergies indicates no known allergies.  Home Medications   Prior to Admission medications   Medication Sig Start Date End Date Taking? Authorizing Provider  dicyclomine (BENTYL) 10 MG capsule Take 1 capsule (10 mg total) by mouth every 6 (six) hours as needed for spasms (cramping). 10/23/14   Ripudeep Krystal Eaton, MD  MELATONIN PO Take 1 tablet by mouth at bedtime as needed (for sleep).    Historical Provider, MD  omeprazole (PRILOSEC) 20 MG capsule Take 1 capsule (20 mg total) by mouth daily. 11/07/14   Lori P Hvozdovic, PA-C  ondansetron (ZOFRAN) 8 MG tablet Take 1 tablet (8 mg total) by mouth every 8 (eight) hours as needed for nausea or vomiting. 11/07/14   Lori P Hvozdovic, PA-C  predniSONE (DELTASONE) 10 MG tablet Take prednisone 25m daily until office appointment, then taper per instructions 10/23/14   Ripudeep KKrystal Eaton MD  promethazine (PHENERGAN) 25 MG tablet Take 1 tablet (25 mg total) by mouth every 6 (six) hours as needed for nausea or vomiting. 10/23/14   Ripudeep KKrystal Eaton MD  traZODone (DESYREL) 50 MG tablet Take 50 mg by mouth at bedtime as needed for  sleep.    Historical Provider, MD   BP 146/86 mmHg  Pulse 106  Temp(Src) 98.3 F (36.8 C) (Oral)  Resp 18  Ht 5' 7"  (1.702 m)  Wt 172 lb (78.019 kg)  BMI 26.93 kg/m2  SpO2 97% Physical Exam  Constitutional: He is oriented to person, place, and time. He appears well-developed and well-nourished. No distress.  HENT:  Head: Normocephalic and atraumatic.  Mouth/Throat: No oropharyngeal exudate.  Eyes: Pupils are equal, round, and reactive to light.  Neck: Normal range of motion.  Cardiovascular: Normal heart sounds and intact distal pulses.   No murmur heard. Pulmonary/Chest: Effort normal and breath sounds normal. No stridor. No respiratory distress. He has no wheezes. He exhibits no tenderness.  Abdominal: Soft. He exhibits no distension. There is generalized tenderness. There is no rigidity, no rebound, no guarding and no CVA tenderness.  Genitourinary:  PT refused GU exam but reports there are no abnormalities in his penis or scrotum.   Musculoskeletal: He exhibits no tenderness.  Neurological: He is oriented to person, place, and time.  He exhibits normal muscle tone.  Skin: Skin is warm. He is not diaphoretic. No pallor.  Psychiatric: He has a normal mood and affect.  Nursing note and vitals reviewed.   ED Course  Procedures (including critical care time) Labs Review Labs Reviewed  COMPREHENSIVE METABOLIC PANEL - Abnormal; Notable for the following:    Glucose, Bld 133 (*)    All other components within normal limits  CBC - Abnormal; Notable for the following:    WBC 11.1 (*)    All other components within normal limits  LIPASE, BLOOD - Abnormal; Notable for the following:    Lipase 82 (*)    All other components within normal limits  URINALYSIS, ROUTINE W REFLEX MICROSCOPIC (NOT AT [facility])  I-STAT CG4 LACTIC ACID, ED    Imaging Review Dg Abd Acute W/chest  11/16/2014  CLINICAL DATA:  Various regions of Abdomen pain - X 3 weeks - top of diaphragm, right  hypochondriac, and most Pain concentrated in hypogastric area; especially after eating or drinking ; hx of Crohn's disease. EXAM: DG ABDOMEN ACUTE W/ 1V CHEST COMPARISON:  11/07/2014 and previous FINDINGS: Heart size and mediastinal contours are within normal limits. Lungs are clear. No effusion. No free air. Normal bowel gas pattern. There are no abnormal calcifications. Regional bones unremarkable. IMPRESSION: No acute cardiopulmonary disease. Negative abdominal radiographs. Electronically Signed   By: Lucrezia Europe M.D.   On: 11/16/2014 16:38   I have personally reviewed and evaluated these images and lab results as part of my medical decision-making.   EKG Interpretation None      MDM   Agron Biggar is a 34 y.o. male with a past medical history significant for Crohn's disease, hypertension, anxiety, and bipolar disorder who presents for abdominal pain, subjective fevers, nausea, and diarrhea. Given the patient's recent admission for similar symptoms for Crohn's flare, suspect Crohn's flares the patient's etiology discomfort. A gastroenteritis would also cause diarrhea, nausea, and abdominal discomfort. The patient reports passing flatus, doubt obstruction. The patient denies dysuria, has no CVA tenderness, doubt UTI or pyelonephritis. The patient denies any history of abdominal surgeries.   On exam, the patient had diffuse tenderness in his abdomen but was otherwise unremarkable. The patient refused GU exam.  Facial had diagnostic laboratory studies performed as well as administration of nausea medicine, pain medication, and fluids.  The patient's diagnostic laboratory testing results are seen above. The patient's lactic acid was normal, his lipase was slightly elevated at 82, his urinalysis did not show infection or laterality. The patient's CMP did not show any evidence of joint abnormality or kidney dysfunction. The patient's CBC showed a mild leukocytosis of 11.1 which is improved from  prior. The patient had an acute abdominal x-ray series which did not show any acute evidence of obstruction or abnormality in his abdomen.  The patient reported his pain had improved following pain medication administration. The patient's GI team was called and they recommended putting the patient back on his full dose of steroids as well as providing nausea and pain medications. The patient was given prescriptions for prednisone, Percocet, and Zofran ODT. The patient will follow-up with him in the next several days with the GI clinic. The patient did not have any other question, concerns, or complaints and the patient was discharged in good condition.  This patient was seen with Dr. Darl Householder, emergency medicine attending.   Final diagnoses:  Generalized abdominal pain       Jeffery Blackbird, MD 11/16/14 1905  Jeffery Brow  Tamsen Meek, MD 11/16/14 2000

## 2014-11-16 NOTE — ED Notes (Signed)
Pt reports abdominal pain for the last several days. Pt with hx of Chron's DZ. Pt reports subjective fever, woke up today with soaked sheets. Pt reports 7 episodes of diarrhea over the last 2 days.

## 2014-11-16 NOTE — Discharge Instructions (Signed)
Crohn Disease Crohn disease is a long-lasting (chronic) disease that affects your gastrointestinal (GI) tract. It often causes irritation and swelling (inflammation) in your small intestine and the beginning of your large intestine. However, it can affect any part of your GI tract. Crohn disease is part of a group of illnesses that are known as inflammatory bowel disease (IBD). Crohn disease may start slowly and get worse over time. Symptoms may come and go. They may also disappear for months or even years at a time (remission). CAUSES The exact cause of Crohn disease is not known. It may be a response that causes your body's defense system (immune system) to mistakenly attack healthy cells and tissues (autoimmune response). Your genes and your environment may also play a role. RISK FACTORS You may be at greater risk for Crohn disease if you:  Have other family members with Crohn disease or another IBD.  Use any tobacco products, including cigarettes, chewing tobacco, or electronic cigarettes.  Are in your 68s.  Have [place] European ancestry. SIGNS AND SYMPTOMS The main signs and symptoms of Crohn disease involve your GI tract. These include:  Diarrhea.  Rectal bleeding.  An urgent need to move your bowels.  The feeling that you are not finished having a bowel movement.  Abdominal pain or cramping.  Constipation. General signs and symptoms of Crohn disease may also include:  Unexplained weight loss.  Fatigue.  Fever.  Nausea.  Loss of appetite.  Joint pain  Changes in vision.  Red bumps on your skin. DIAGNOSIS Your health care provider may suspect Crohn disease based on your symptoms and your medical history. Your health care provider will do a physical exam. You may need to see a health care provider who specializes in diseases of the digestive tract (gastroenterologist). You may also have tests to help your health care providers make a diagnosis. These may  include:  Blood tests.  Stool sample tests.  Imaging tests, such as X-rays and CT scans.  Tests to examine the inside of your intestines using a long, flexible tube that has a light and a camera on the end (endoscopy or colonoscopy).  A procedure to take tissue samples from inside your bowel (biopsy) to be examined under a microscope. TREATMENT  There is no cure for Crohn disease. Treatment will focus on managing your symptoms. Crohn disease affects each person differently. Your treatment may include:  Resting your bowels. Drinking only clear liquids or getting nutrition through an IV for a period of time gives your bowels a chance to heal because they are not passing stools.  Medicines. These may be used alone or in combination (combination therapy). These may include antibiotic medicines. You may be given medicines that help to:  Reduce inflammation.  Control your immune system activity.  Fight infections.  Relieve cramps and prevent diarrhea.  Control your pain.  Surgery. You may need surgery if:  Medicines and other treatments are no longer working.  You develop complications from severe Crohn disease.  A section of your intestine becomes so damaged that it needs to be removed. HOME CARE INSTRUCTIONS  Take medicines only as directed by your health care provider.  If you were prescribed an antibiotic medicine, finish it all even if you start to feel better.  Keep all follow-up visits as directed by your health care provider. This is important.  Talk with your health care provider about changing your diet. This may help your symptoms. Your health care provide may recommend changes, such  as:  Drinking more fluids.  Avoiding milk and other foods that contain lactose.  Eating a low-fat diet.  Avoiding high-fiber foods, such as popcorn and nuts.  Avoiding carbonated beverages, such as soda.  Eating smaller meals more often rather than eating large  meals.  Keeping a food diary to identify foods that make your symptoms better or worse.  Do not use any tobacco products, including cigarettes, chewing tobacco, or electronic cigarettes. If you need help quitting, ask your health care provider.  Limit alcohol intake to no more than 1 drink per day for nonpregnant women and 2 drinks per day for men. One drink equals 12 ounces of beer, 5 ounces of wine, or 1 ounces of hard liquor.  Exercise daily or as directed by your health care provider. SEEK MEDICAL CARE IF:  You have diarrhea, abdominal cramps, and other gastrointestinal problems that are present almost all of the time.  Your symptoms do not improve with treatment.  You continue to lose weight.  You develop a rash or sores on your skin.  You develop eye problems.  You have a fever.   Your symptoms get worse.  You develop new symptoms. SEEK IMMEDIATE MEDICAL CARE IF:  You have bloody diarrhea.  You develop severe abdominal pain.  You cannot pass stools.   This information is not intended to replace advice given to you by your health care provider. Make sure you discuss any questions you have with your health care provider.   Document Released: 10/30/2004 Document Revised: 02/10/2014 Document Reviewed: 09/07/2013 Elsevier Interactive Patient Education [employer].

## 2014-11-17 LAB — TB SKIN TEST
Induration: 0 mm
TB Skin Test: NEGATIVE

## 2014-11-22 ENCOUNTER — Encounter: Payer: Self-pay | Admitting: Gastroenterology

## 2014-11-22 ENCOUNTER — Ambulatory Visit (AMBULATORY_SURGERY_CENTER): Payer: Medicare Other | Admitting: Gastroenterology

## 2014-11-22 VITALS — BP 136/80 | HR 75 | Temp 97.1°F | Resp 16 | Ht 67.0 in | Wt 172.0 lb

## 2014-11-22 DIAGNOSIS — D123 Benign neoplasm of transverse colon: Secondary | ICD-10-CM | POA: Diagnosis not present

## 2014-11-22 DIAGNOSIS — F319 Bipolar disorder, unspecified: Secondary | ICD-10-CM | POA: Diagnosis not present

## 2014-11-22 DIAGNOSIS — K219 Gastro-esophageal reflux disease without esophagitis: Secondary | ICD-10-CM | POA: Diagnosis not present

## 2014-11-22 DIAGNOSIS — K5090 Crohn's disease, unspecified, without complications: Secondary | ICD-10-CM | POA: Diagnosis not present

## 2014-11-22 DIAGNOSIS — R197 Diarrhea, unspecified: Secondary | ICD-10-CM

## 2014-11-22 DIAGNOSIS — F209 Schizophrenia, unspecified: Secondary | ICD-10-CM | POA: Diagnosis not present

## 2014-11-22 DIAGNOSIS — E669 Obesity, unspecified: Secondary | ICD-10-CM | POA: Diagnosis not present

## 2014-11-22 DIAGNOSIS — R933 Abnormal findings on diagnostic imaging of other parts of digestive tract: Secondary | ICD-10-CM

## 2014-11-22 DIAGNOSIS — F329 Major depressive disorder, single episode, unspecified: Secondary | ICD-10-CM | POA: Diagnosis not present

## 2014-11-22 DIAGNOSIS — K529 Noninfective gastroenteritis and colitis, unspecified: Secondary | ICD-10-CM | POA: Diagnosis not present

## 2014-11-22 MED ORDER — SODIUM CHLORIDE 0.9 % IV SOLN: 500.0000 mL | INTRAVENOUS | Status: DC

## 2014-11-22 NOTE — Op Note (Signed)
[place]  [address] [place], [id number]   COLONOSCOPY PROCEDURE REPORT  PATIENT: Jeffery Mcintosh, Jeffery Mcintosh  MR#: [id number] BIRTHDATE: Nov 27, 1980 , 34  yrs. old GENDER: male ENDOSCOPIST: Ladene Artist, MD, [facility] REFERRED BY: Mauricio Po, FNP PROCEDURE DATE:  11/22/2014 PROCEDURE:   Colonoscopy, diagnostic, Colonoscopy with biopsy, and Colonoscopy with snare polypectomy First Screening Colonoscopy - Avg.  risk and is 50 yrs.  old or older - No.  Prior Negative Screening - Now for repeat screening. N/A  History of Adenoma - Now for follow-up colonoscopy & has been > or = to 3 yrs.  N/A  Polyps removed today? Yes ASA CLASS:   Class III INDICATIONS:Clinically significant diarrhea of unexplained origin, an abnormal CT, and abdominal pain in the lower left quadrant. MEDICATIONS: Monitored anesthesia care and Propofol 300 mg IV DESCRIPTION OF PROCEDURE:   After the risks benefits and alternatives of the procedure were thoroughly explained, informed consent was obtained.  The digital rectal exam revealed no abnormalities of the rectum.   The LB [id number]  endoscope was introduced through the anus and advanced to the cecum, which was identified by the ileocecal valve. No adverse events experienced.   The quality of the prep was good.  (Suprep was used) The instrument was then slowly withdrawn as the colon was fully examined. Estimated blood loss is zero unless otherwise noted in this procedure report.    COLON FINDINGS: Abnormal mucosa was found at the ileocecal valve and cecum.  The mucosa was nodular and had pseudopolyps. The base of the cecum/appendiceal orifice was involved and the appendiceal orifice was not clearly identified. The TI opening was stenotic and deformed. and despite multiple attemps I could not intubate the TI. This was likely consistent with Crohn's disease.  Multiple biopsies of the area were performed.   Two sessile polyps measuring  5-6 mm in size were found in the transverse colon.  Polypectomies were performed with a cold snare.  The resection was complete, the polyp tissue was completely retrieved and sent to histology.   Abnormal mucosa was found in the sigmoid colon and transverse colon.  The mucosa had mild patchy erythema and had pseudopolyps.  This was likely consistent with Crohn's disease.  Multiple biopsies of this area in the transverse colon were performed.   The examination was otherwise normal.  Retroflexed views revealed internal Grade I hemorrhoids. The time to cecum = 2.2 Withdrawal time = 15.6   The scope was withdrawn and the procedure completed.  COMPLICATIONS: There were no immediate complications.  ENDOSCOPIC IMPRESSION: 1.   Abnormal mucosa and deformity at the ileocecal valve and cecum; multiple biopsies performed 2.   Two sessile polyps in the transverse colon; polypectomies performed with a cold snare 3.   Patchy abnormal mucosa in the sigmoid colon and transverse colon; multiple biopsies performed 4.   Grade l internal hemorrhoids  RECOMMENDATIONS: 1.  Await pathology results 2.  Out patient follow-up in 4-6 weeks.  eSigned:  Ladene Artist, MD, [facility] 11/22/2014 9:04 AM   [C   PATIENT NAME:  [name], [name] MR#: [id number]

## 2014-11-22 NOTE — Progress Notes (Signed)
Called to room to assist during endoscopic procedure.  Patient ID and intended procedure confirmed with present staff. Received instructions for my participation in the procedure from the performing physician.  

## 2014-11-22 NOTE — Progress Notes (Signed)
Report to PACU, RN, vss, BBS= Clear.  

## 2014-11-22 NOTE — Patient Instructions (Signed)
YOU HAD AN ENDOSCOPIC PROCEDURE TODAY AT [facility]:   Refer to the procedure report that was given to you for any specific questions about what was found during the examination.  If the procedure report does not answer your questions, please call your gastroenterologist to clarify.  If you requested that your care partner not be given the details of your procedure findings, then the procedure report has been included in a sealed envelope for you to review at your convenience later.  YOU SHOULD EXPECT: Some feelings of bloating in the abdomen. Passage of more gas than usual.  Walking can help get rid of the air that was put into your GI tract during the procedure and reduce the bloating. If you had a lower endoscopy (such as a colonoscopy or flexible sigmoidoscopy) you may notice spotting of blood in your stool or on the toilet paper. If you underwent a bowel prep for your procedure, you may not have a normal bowel movement for a few days.  Please Note:  You might notice some irritation and congestion in your nose or some drainage.  This is from the oxygen used during your procedure.  There is no need for concern and it should clear up in a day or so.  SYMPTOMS TO REPORT IMMEDIATELY:   Following lower endoscopy (colonoscopy or flexible sigmoidoscopy):  Excessive amounts of blood in the stool  Significant tenderness or worsening of abdominal pains  Swelling of the abdomen that is new, acute  Fever of 100F or higher    For urgent or emergent issues, a gastroenterologist can be reached at any hour by calling [phone].   DIET: Your first meal following the procedure should be a small meal and then it is ok to progress to your normal diet. Heavy or fried foods are harder to digest and may make you feel nauseous or bloated.  Likewise, meals heavy in dairy and vegetables can increase bloating.  Drink plenty of fluids but you should avoid alcoholic beverages for 24  hours.  ACTIVITY:  You should plan to take it easy for the rest of today and you should NOT DRIVE or use heavy machinery until tomorrow (because of the sedation medicines used during the test).    FOLLOW UP: Our staff will call the number listed on your records the next business day following your procedure to check on you and address any questions or concerns that you may have regarding the information given to you following your procedure. If we do not reach you, we will leave a message.  However, if you are feeling well and you are not experiencing any problems, there is no need to return our call.  We will assume that you have returned to your regular daily activities without incident.  If any biopsies were taken you will be contacted by phone or by letter within the next 1-3 weeks.  Please call us at [phone] if you have not heard about the biopsies in 3 weeks.    SIGNATURES/CONFIDENTIALITY: You and/or your care partner have signed paperwork which will be entered into your electronic medical record.  These signatures attest to the fact that that the information above on your After Visit Summary has been reviewed and is understood.  Full responsibility of the confidentiality of this discharge information lies with you and/or your care-partner.   Resume medications. Follow up in 4-6 weeks in the office. Information given on polyps and hemorrhoids.

## 2014-11-23 ENCOUNTER — Telehealth: Payer: Self-pay

## 2014-11-23 NOTE — Telephone Encounter (Signed)
Left a message at [phone] for the pt to call us back if any questions or concerns. maw

## 2014-11-27 ENCOUNTER — Ambulatory Visit: Payer: Medicare Other | Admitting: Gastroenterology

## 2014-11-28 ENCOUNTER — Ambulatory Visit (INDEPENDENT_AMBULATORY_CARE_PROVIDER_SITE_OTHER): Payer: Medicare Other | Admitting: Gastroenterology

## 2014-11-28 DIAGNOSIS — Z23 Encounter for immunization: Secondary | ICD-10-CM | POA: Diagnosis not present

## 2014-11-29 ENCOUNTER — Telehealth: Payer: Self-pay | Admitting: Gastroenterology

## 2014-11-29 MED ORDER — PREDNISONE 10 MG PO TABS: 30.0000 mg | ORAL_TABLET | Freq: Every day | ORAL | Status: DC

## 2014-11-29 NOTE — Telephone Encounter (Signed)
What milligram prednisone do you want patient to be on until appt scheduled for 01/15/15? Patient was tapered off by Cecille Rubin but then patient went to ER for recurrent abd pain. He was put back on prednisone 20 mg 2 tablets daily. Is this the dose you want him to continue? Also does he need a sooner appt?

## 2014-11-29 NOTE — Telephone Encounter (Signed)
Rescheduled appt for 12/19/14 at 10:30am with Alonza Bogus, PA. Pt notified. Also informed patient that he needs to be on prednisone 30 mg daily until appt. Pt verbalized understanding. Prescription sent to pharmacy.

## 2014-11-29 NOTE — Telephone Encounter (Signed)
Prednisone 30 mg daily for now APP appt within 2-3 weeks for follow up

## 2014-11-30 ENCOUNTER — Other Ambulatory Visit: Payer: Self-pay

## 2014-11-30 DIAGNOSIS — K633 Ulcer of intestine: Secondary | ICD-10-CM

## 2014-12-05 ENCOUNTER — Telehealth: Payer: Self-pay | Admitting: Gastroenterology

## 2014-12-05 MED ORDER — TRAMADOL HCL 50 MG PO TABS
50.0000 mg | ORAL_TABLET | Freq: Three times a day (TID) | ORAL | Status: DC | PRN
Start: 2014-12-05 — End: 2014-12-19

## 2014-12-05 NOTE — Telephone Encounter (Signed)
Informed patient to keep his appt for his CT scan on 12/07/14 and I will fax the prescription of Tramadol to his pharmacy with no refills. Pt verbalized understanding.

## 2014-12-05 NOTE — Telephone Encounter (Signed)
Patient states he would like a refill of oxycodone. Patient states he has been out for two weeks and just takes it as needed. Patient got it filled last in the hospital but states the PA filled last time he was seen in the office. Informed patient we don't normally fill pain medications but I will ask Dr. Fuller Plan. Please advise.

## 2014-12-05 NOTE — Telephone Encounter (Signed)
See Lori's phone note. OK for a limited supply of Tramadol 50 mg tid, #30, no refills He has a CT enterography scheduled on 11/3. We need this completed.

## 2014-12-07 ENCOUNTER — Inpatient Hospital Stay: Admission: RE | Admit: 2014-12-07 | Payer: Medicare Other | Source: Ambulatory Visit

## 2014-12-15 ENCOUNTER — Other Ambulatory Visit: Payer: Self-pay | Admitting: Gastroenterology

## 2014-12-18 ENCOUNTER — Ambulatory Visit (INDEPENDENT_AMBULATORY_CARE_PROVIDER_SITE_OTHER)
Admission: RE | Admit: 2014-12-18 | Discharge: 2014-12-18 | Disposition: A | Discharge status: Home / Self Care | Payer: Medicare Other | Source: Ambulatory Visit | Attending: Internal Medicine | Admitting: Internal Medicine

## 2014-12-18 DIAGNOSIS — K5090 Crohn's disease, unspecified, without complications: Secondary | ICD-10-CM | POA: Diagnosis not present

## 2014-12-18 DIAGNOSIS — K633 Ulcer of intestine: Secondary | ICD-10-CM

## 2014-12-18 DIAGNOSIS — R109 Unspecified abdominal pain: Secondary | ICD-10-CM | POA: Diagnosis not present

## 2014-12-18 DIAGNOSIS — K639 Disease of intestine, unspecified: Secondary | ICD-10-CM | POA: Diagnosis not present

## 2014-12-18 DIAGNOSIS — K50918 Crohn's disease, unspecified, with other complication: Secondary | ICD-10-CM

## 2014-12-18 MED ORDER — IOHEXOL 300 MG/ML  SOLN
100.0000 mL | Freq: Once | INTRAMUSCULAR | Status: AC | PRN
  Administered 2014-12-18: 100 mL via INTRAVENOUS

## 2014-12-19 ENCOUNTER — Ambulatory Visit (INDEPENDENT_AMBULATORY_CARE_PROVIDER_SITE_OTHER): Payer: Medicare Other | Admitting: Gastroenterology

## 2014-12-19 ENCOUNTER — Other Ambulatory Visit: Payer: Self-pay

## 2014-12-19 ENCOUNTER — Encounter: Payer: Self-pay | Admitting: Gastroenterology

## 2014-12-19 ENCOUNTER — Ambulatory Visit: Payer: Medicare Other | Admitting: Gastroenterology

## 2014-12-19 VITALS — BP 142/70 | HR 112 | Ht 65.25 in | Wt 182.2 lb

## 2014-12-19 DIAGNOSIS — K50018 Crohn's disease of small intestine with other complication: Secondary | ICD-10-CM

## 2014-12-19 DIAGNOSIS — R1031 Right lower quadrant pain: Secondary | ICD-10-CM | POA: Diagnosis not present

## 2014-12-19 DIAGNOSIS — R1013 Epigastric pain: Secondary | ICD-10-CM

## 2014-12-19 DIAGNOSIS — K50119 Crohn's disease of large intestine with unspecified complications: Secondary | ICD-10-CM

## 2014-12-19 MED ORDER — HYDROCODONE-ACETAMINOPHEN 5-325MG PREPACK (~~LOC~~: ORAL_TABLET | ORAL | Status: DC

## 2014-12-19 MED ORDER — OMEPRAZOLE 40 MG PO CPDR: 40.0000 mg | DELAYED_RELEASE_CAPSULE | Freq: Every day | ORAL | Status: DC

## 2014-12-19 MED ORDER — PREDNISONE 10 MG PO TABS: ORAL_TABLET | ORAL | Status: DC

## 2014-12-19 NOTE — Patient Instructions (Addendum)
Finish up the Omeprazole 20 mg. We sent a new prescription  For Omeprazole 40 mg to [facility].  Take 1 tab once daily in the morning.  Continue Prednisone 30 mg daily. [facility] We made you an appointment with Dr. Fuller Plan for 02-20-2014 at 10:45 am.  We have given you a prescription for Vicodin to take to your pharmacy.  You are scheduled for Remicade infusions at [facility] will need to register 15 minutes prior to each appointment in admitting.  01/10/15 8:00 am 01/25/15 11:00 am 02/22/15 9:00 am 04/19/15- they will help you schedule a time at your appointment on 02/22/15

## 2014-12-19 NOTE — Progress Notes (Signed)
Quick Note:  Dr. Lynne Leader patient ______

## 2014-12-27 ENCOUNTER — Other Ambulatory Visit: Payer: Self-pay | Admitting: Gastroenterology

## 2015-01-02 ENCOUNTER — Encounter: Payer: Self-pay | Admitting: Gastroenterology

## 2015-01-02 NOTE — Progress Notes (Signed)
     12/19/2014 Jeffery Mcintosh [id number] 26-Oct-1980   History of Present Illness:  See detailed note from [facility], PA-C on 11/07/2014 for outline of previous history, etc.  Since that time he underwent colonoscopy on October 19, which showed abnormal mucosa and deformity at the IC valve and cecum, 2 sessile polyps in the transverse colon, patchy abnormal mucosa in the sigmoid and transverse colon, and grade 1 internal hemorrhoids. Biopsies showed quiescent colitis and one small sessile serrated polyp. Colonoscopy recall entered for 11/2019. Due to the findings in the terminal ileum a CT enterography was scheduled to get a better look at the terminal ileum and other areas of small bowel involvement. CT enterography was just performed yesterday and showed "mild decrease in distal small bowel and cecal wall thickening compared to previous study, consistent with improving Crohn's disease. 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  Patient's hepatitis B surface antibody was positive, but surface antigen was negative. Quantiferon gold was indeterminate, but chest x-ray was negative and TB skin test was negative.  Patient is here today for follow-up. Continues to complain of a lot of sharp abdominal pain all over his abdomen, but mostly around his waist. He is using dicyclomine, but only takes it once daily because he says it doesn't help anyways. He is on prednisone 30 mg daily. He says that he can't do anything but think about the pain every morning when he wakes up.  Also complains of burning upper abdominal pain.  He is on omeprazole 20 mg daily for upper GI complaints and reflux.  Current Medications, Allergies, Past Medical History, Past Surgical History, Family History and Social History were reviewed in [employer] record.   Physical Exam: BP 142/70 mmHg  Pulse  112  Ht 5' 5.25" (1.657 m)  Wt 182 lb 4 oz (82.668 kg)  BMI 30.11 kg/m2 General: Well developed black male in no acute distress Head: Normocephalic and atraumatic Eyes:  Sclerae anicteric, conjunctiva pink  Ears: Normal auditory acuity Lungs: Clear throughout to auscultation Heart: Regular rate and rhythm Abdomen: Soft, non-distended.  Normal bowel sounds.  Some RLQ TTP without R/R/G. Musculoskeletal: Symmetrical with no gross deformities  Extremities: No edema  Neurological: Alert oriented x 4, grossly non-focal Psychological:  Alert and cooperative. Normal mood and affect  Assessment and Recommendations: *34 year old male with a long-standing history of Crohn's status post recent admission for exacerbation.  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  Mcintosh is to start Remicade infusions and we will get the induction set up for him.  We'll continue prednisone 30 mg daily for now, but we will follow up with him after he receives his first Remicade infusion to discuss tapering further. We will give him a prescription for Vicodin #20, but he was advised that he will no longer receive pain medication from our office after this. He will follow-up in January with Jeffery Mcintosh as already scheduled. *Epigastric abdominal pain:  Will increase omeprazole to 40 mg daily.

## 2015-01-03 DIAGNOSIS — R1013 Epigastric pain: Secondary | ICD-10-CM | POA: Insufficient documentation

## 2015-01-03 NOTE — Progress Notes (Signed)
Reviewed and agree with management plan.  Mutasim Tuckey T. Koa Palla, MD FACG 

## 2015-01-10 ENCOUNTER — Encounter (HOSPITAL_COMMUNITY): Payer: Medicare Other

## 2015-01-15 ENCOUNTER — Ambulatory Visit: Payer: Medicare Other | Admitting: Gastroenterology

## 2015-01-15 ENCOUNTER — Telehealth: Payer: Self-pay | Admitting: Gastroenterology

## 2015-01-15 NOTE — Telephone Encounter (Signed)
Phone line busy. I will continue to try and reach the patient Per note from Alonza Bogus, PA patient to call after his first Remicade infusion on 01/22/15 with a symptom update to determine prednisone taper schedule.

## 2015-01-15 NOTE — Telephone Encounter (Signed)
Phone line still busy.  I will try tomorrow

## 2015-01-16 MED ORDER — TRAMADOL HCL 50 MG PO TABS: 50.0000 mg | ORAL_TABLET | Freq: Four times a day (QID) | ORAL | Status: DC | PRN

## 2015-01-16 NOTE — Telephone Encounter (Signed)
Patient notified' He will call with an update after Remicade

## 2015-01-16 NOTE — Telephone Encounter (Signed)
To Dr. Fuller Plan

## 2015-01-16 NOTE — Telephone Encounter (Signed)
Both phone lines are busy I sent rx to the pharmacy

## 2015-01-16 NOTE — Telephone Encounter (Signed)
Wife advised that according to the note from [place] patient to have his first Remicade infusion then call with a symptom update for instructions on weaning prednisone.  Patient is requesting a refill of tramadol.  Dr. Fuller Plan ok to refill?

## 2015-01-16 NOTE — Telephone Encounter (Signed)
OK to refill Tramadol

## 2015-01-22 ENCOUNTER — Encounter (HOSPITAL_COMMUNITY): Payer: Self-pay

## 2015-01-22 ENCOUNTER — Encounter (INDEPENDENT_AMBULATORY_CARE_PROVIDER_SITE_OTHER): Payer: Self-pay

## 2015-01-22 ENCOUNTER — Encounter (HOSPITAL_COMMUNITY)
Admission: RE | Admit: 2015-01-22 | Discharge: 2015-01-22 | Disposition: A | Discharge status: Home / Self Care | Payer: Medicare Other | Source: Ambulatory Visit | Attending: Gastroenterology | Admitting: Gastroenterology

## 2015-01-22 VITALS — BP 130/80 | HR 80 | Temp 97.9°F | Resp 16 | Ht 66.0 in | Wt 189.6 lb

## 2015-01-22 DIAGNOSIS — K50119 Crohn's disease of large intestine with unspecified complications: Secondary | ICD-10-CM | POA: Insufficient documentation

## 2015-01-22 MED ORDER — SODIUM CHLORIDE 0.9 % IV SOLN
5.0000 mg/kg | Freq: Once | INTRAVENOUS | Status: AC
  Administered 2015-01-22: 400 mg via INTRAVENOUS
  Filled 2015-01-22: qty 40

## 2015-01-22 MED ORDER — DIPHENHYDRAMINE HCL 25 MG PO CAPS
50.0000 mg | ORAL_CAPSULE | Freq: Every day | ORAL | Status: DC
  Administered 2015-01-22: 50 mg via ORAL
  Filled 2015-01-22: qty 2

## 2015-01-22 MED ORDER — SODIUM CHLORIDE 0.9 % IV SOLN
INTRAVENOUS | Status: DC
  Administered 2015-01-22: 08:00:00 via INTRAVENOUS

## 2015-01-22 MED ORDER — ACETAMINOPHEN 325 MG PO TABS
650.0000 mg | ORAL_TABLET | Freq: Every day | ORAL | Status: DC
  Administered 2015-01-22: 650 mg via ORAL
  Filled 2015-01-22: qty 2

## 2015-01-22 NOTE — Discharge Instructions (Signed)
Infliximab injection What is this medicine? INFLIXIMAB (in [place] i mab) is used to treat Crohn's disease and ulcerative colitis. It is also used to treat ankylosing spondylitis, psoriasis, and some forms of arthritis. This medicine may be used for other purposes; ask your health care provider or pharmacist if you have questions. What should I tell my health care provider before I take this medicine? They need to know if you have any of these conditions: -diabetes -exposure to tuberculosis -heart failure -hepatitis or liver disease -immune system problems -infection -lung or breathing disease, like COPD -multiple sclerosis -current or past resident of [place] or [place] -seizure disorder -an unusual or allergic reaction to infliximab, mouse proteins, other medicines, foods, dyes, or preservatives -pregnant or trying to get pregnant -breast-feeding How should I use this medicine? This medicine is for injection into a vein. It is usually given by a health care professional in a hospital or clinic setting. A special MedGuide will be given to you by the pharmacist with each prescription and refill. Be sure to read this information carefully each time. Talk to your pediatrician regarding the use of this medicine in children. Special care may be needed. Overdosage: If you think you have taken too much of this medicine contact a poison control center or emergency room at once. NOTE: This medicine is only for you. Do not share this medicine with others. What if I miss a dose? It is important not to miss your dose. Call your doctor or health care professional if you are unable to keep an appointment. What may interact with this medicine? Do not take this medicine with any of the following medications: -anakinra -rilonacept This medicine may also interact with the following medications: -vaccines This list may not describe all possible interactions. Give your health care provider  a list of all the medicines, herbs, non-prescription drugs, or dietary supplements you use. Also tell them if you smoke, drink alcohol, or use illegal drugs. Some items may interact with your medicine. What should I watch for while using this medicine? Visit your doctor or health care professional for regular checks on your progress. If you get a cold or other infection while receiving this medicine, call your doctor or health care professional. Do not treat yourself. This medicine may decrease your body's ability to fight infections. Before beginning therapy, your doctor may do a test to see if you have been exposed to tuberculosis. This medicine may make the symptoms of heart failure worse in some patients. If you notice symptoms such as increased shortness of breath or swelling of the ankles or legs, contact your health care provider right away. If you are going to have surgery or dental work, tell your health care professional or dentist that you have received this medicine. If you take this medicine for plaque psoriasis, stay out of the sun. If you cannot avoid being in the sun, wear protective clothing and use sunscreen. Do not use sun lamps or tanning beds/booths. What side effects may I notice from receiving this medicine? Side effects that you should report to your doctor or health care professional as soon as possible: -allergic reactions like skin rash, itching or hives, swelling of the face, lips, or tongue -chest pain -fever or chills, usually related to the infusion -muscle or joint pain -red, scaly patches or raised bumps on the skin -signs of infection - fever or chills, cough, sore throat, pain or difficulty passing urine -swollen lymph nodes in the neck, underarm,  or groin areas -unexplained weight loss -unusual bleeding or bruising -unusually weak or tired -yellowing of the eyes or skin Side effects that usually do not require medical attention (report to your doctor or health  care professional if they continue or are bothersome): -headache -heartburn or stomach pain -nausea, vomiting This list may not describe all possible side effects. Call your doctor for medical advice about side effects. You may report side effects to FDA at 1-800-FDA-1088. Where should I keep my medicine? This drug is given in a hospital or clinic and will not be stored at home. NOTE: This sheet is a summary. It may not cover all possible information. If you have questions about this medicine, talk to your doctor, pharmacist, or health care provider.    2016, Elsevier/Gold Standard. (2007-09-08 10:26:02)

## 2015-01-25 ENCOUNTER — Other Ambulatory Visit: Payer: Self-pay | Admitting: Gastroenterology

## 2015-01-25 ENCOUNTER — Encounter (HOSPITAL_COMMUNITY): Payer: Medicare Other

## 2015-01-31 ENCOUNTER — Telehealth: Payer: Self-pay | Admitting: Gastroenterology

## 2015-02-01 NOTE — Telephone Encounter (Signed)
Left message for patient to call back  

## 2015-02-06 ENCOUNTER — Encounter (HOSPITAL_COMMUNITY): Payer: Medicare Other

## 2015-02-06 NOTE — Telephone Encounter (Signed)
Patient reports that he is having continued abdominal pain.  Worse in the early am and at HS.  He is still having 3-4 diarrhea stools a day.  No bleeding.  He has had 2 of his remicade induction doses and is scheduled for another infusion next week.  He is currently on 40 mg of prednisone.  He was to call after his second Remicade infusion for prednisone taper instructions.  Dr. Fuller Plan he has follow up with you on 02/21/15.  Please advise

## 2015-02-06 NOTE — Telephone Encounter (Signed)
Taper prednisone to 30 mg daily starting 1 week after next Remicade infusion.

## 2015-02-06 NOTE — Telephone Encounter (Signed)
Left message for patient to call back  

## 2015-02-08 NOTE — Telephone Encounter (Signed)
Left message for patient to call back  

## 2015-02-12 NOTE — Telephone Encounter (Signed)
Patient has not returned calls.  He has follow up on 02/21/15.  He should decrease prednisone on 02/20/15.  He will be notified at appt on 1/18

## 2015-02-13 ENCOUNTER — Encounter (HOSPITAL_COMMUNITY)
Admission: RE | Admit: 2015-02-13 | Discharge: 2015-02-13 | Disposition: A | Discharge status: Home / Self Care | Payer: Medicare Other | Source: Ambulatory Visit | Attending: Gastroenterology | Admitting: Gastroenterology

## 2015-02-13 ENCOUNTER — Encounter (INDEPENDENT_AMBULATORY_CARE_PROVIDER_SITE_OTHER): Payer: Self-pay

## 2015-02-13 VITALS — BP 125/64 | HR 89 | Temp 98.4°F | Resp 18 | Ht 66.5 in | Wt 197.4 lb

## 2015-02-13 DIAGNOSIS — K50119 Crohn's disease of large intestine with unspecified complications: Secondary | ICD-10-CM | POA: Diagnosis not present

## 2015-02-13 MED ORDER — SODIUM CHLORIDE 0.9 % IV SOLN
5.0000 mg/kg | INTRAVENOUS | Status: DC
  Administered 2015-02-13: 400 mg via INTRAVENOUS
  Filled 2015-02-13: qty 40

## 2015-02-13 MED ORDER — ACETAMINOPHEN 325 MG PO TABS
650.0000 mg | ORAL_TABLET | Freq: Every day | ORAL | Status: DC
  Administered 2015-02-13: 650 mg via ORAL
  Filled 2015-02-13: qty 2

## 2015-02-13 MED ORDER — DIPHENHYDRAMINE HCL 25 MG PO CAPS
50.0000 mg | ORAL_CAPSULE | Freq: Every day | ORAL | Status: DC
  Administered 2015-02-13: 50 mg via ORAL
  Filled 2015-02-13: qty 2

## 2015-02-13 MED ORDER — SODIUM CHLORIDE 0.9 % IV SOLN
INTRAVENOUS | Status: DC
  Administered 2015-02-13: 09:00:00 via INTRAVENOUS

## 2015-02-14 ENCOUNTER — Other Ambulatory Visit: Payer: Self-pay | Admitting: Emergency Medicine

## 2015-02-14 MED ORDER — OMEPRAZOLE 40 MG PO CPDR: 40.0000 mg | DELAYED_RELEASE_CAPSULE | Freq: Every day | ORAL | Status: DC

## 2015-02-21 ENCOUNTER — Telehealth: Payer: Self-pay | Admitting: Gastroenterology

## 2015-02-21 ENCOUNTER — Ambulatory Visit: Payer: Medicare Other | Admitting: Gastroenterology

## 2015-02-21 NOTE — Telephone Encounter (Signed)
Patient dismissed from [facility] by Lucio Edward MD , effective February 21, 2015. Dismissal letter sent out by certified / registered mail.  DAJ  Received signed domestic return receipt verifying delivery of certified letter on March 02, 2015. Article number [name]

## 2015-02-21 NOTE — Telephone Encounter (Signed)
Do you want to charge?

## 2015-02-21 NOTE — Telephone Encounter (Signed)
Yes charge

## 2015-02-22 ENCOUNTER — Encounter (HOSPITAL_COMMUNITY): Payer: Medicare Other

## 2015-02-24 ENCOUNTER — Other Ambulatory Visit: Payer: Self-pay | Admitting: Gastroenterology

## 2015-03-06 ENCOUNTER — Encounter (HOSPITAL_COMMUNITY): Payer: Medicare Other

## 2015-03-06 DIAGNOSIS — R1013 Epigastric pain: Secondary | ICD-10-CM | POA: Diagnosis not present

## 2015-03-06 DIAGNOSIS — K5080 Crohn's disease of both small and large intestine without complications: Secondary | ICD-10-CM | POA: Diagnosis not present

## 2015-03-13 ENCOUNTER — Encounter (HOSPITAL_COMMUNITY)
Admission: RE | Admit: 2015-03-13 | Discharge: 2015-03-13 | Disposition: A | Discharge status: Home / Self Care | Payer: Medicare Other | Source: Ambulatory Visit | Attending: Gastroenterology | Admitting: Gastroenterology

## 2015-03-13 DIAGNOSIS — K50119 Crohn's disease of large intestine with unspecified complications: Secondary | ICD-10-CM | POA: Insufficient documentation

## 2015-03-13 NOTE — Progress Notes (Signed)
Pt. Showed up for remicade infusion, pt. Dismissed from Dr. Lynne Leader office, pt. Has a new doctor, Dr. Truman Hayward at [facility], pt. Has not been established with Dr. Truman Hayward, Dr. Lynne Leader office stated" He can not get his infusion thru Dr. Fuller Plan today, pt. To get established with new Dr. For Orders", Pt instructed to follow- up with Dr. Truman Hayward, and notified that he would not receive his Remicade today, pt. verbalized understanding.

## 2015-03-14 DIAGNOSIS — K208 Other esophagitis: Secondary | ICD-10-CM | POA: Diagnosis not present

## 2015-03-14 DIAGNOSIS — D1330 Benign neoplasm of unspecified part of small intestine: Secondary | ICD-10-CM | POA: Diagnosis not present

## 2015-03-14 DIAGNOSIS — R1013 Epigastric pain: Secondary | ICD-10-CM | POA: Diagnosis not present

## 2015-03-14 DIAGNOSIS — R109 Unspecified abdominal pain: Secondary | ICD-10-CM | POA: Diagnosis not present

## 2015-03-26 ENCOUNTER — Ambulatory Visit: Payer: Medicare Other | Admitting: Gastroenterology

## 2015-05-08 ENCOUNTER — Emergency Department (HOSPITAL_COMMUNITY)
Admission: EM | Admit: 2015-05-08 | Discharge: 2015-05-08 | Disposition: A | Discharge status: Home / Self Care | Payer: Medicare Other | Attending: Emergency Medicine | Admitting: Emergency Medicine

## 2015-05-08 ENCOUNTER — Emergency Department (HOSPITAL_COMMUNITY): Payer: Medicare Other

## 2015-05-08 ENCOUNTER — Encounter (HOSPITAL_COMMUNITY): Payer: Self-pay | Admitting: Emergency Medicine

## 2015-05-08 DIAGNOSIS — M1990 Unspecified osteoarthritis, unspecified site: Secondary | ICD-10-CM | POA: Diagnosis not present

## 2015-05-08 DIAGNOSIS — Z7952 Long term (current) use of systemic steroids: Secondary | ICD-10-CM | POA: Insufficient documentation

## 2015-05-08 DIAGNOSIS — Y9389 Activity, other specified: Secondary | ICD-10-CM | POA: Diagnosis not present

## 2015-05-08 DIAGNOSIS — I10 Essential (primary) hypertension: Secondary | ICD-10-CM | POA: Insufficient documentation

## 2015-05-08 DIAGNOSIS — F319 Bipolar disorder, unspecified: Secondary | ICD-10-CM | POA: Insufficient documentation

## 2015-05-08 DIAGNOSIS — Y998 Other external cause status: Secondary | ICD-10-CM | POA: Diagnosis not present

## 2015-05-08 DIAGNOSIS — S93602A Unspecified sprain of left foot, initial encounter: Secondary | ICD-10-CM | POA: Insufficient documentation

## 2015-05-08 DIAGNOSIS — F419 Anxiety disorder, unspecified: Secondary | ICD-10-CM | POA: Diagnosis not present

## 2015-05-08 DIAGNOSIS — Z87891 Personal history of nicotine dependence: Secondary | ICD-10-CM | POA: Diagnosis not present

## 2015-05-08 DIAGNOSIS — Y9289 Other specified places as the place of occurrence of the external cause: Secondary | ICD-10-CM | POA: Diagnosis not present

## 2015-05-08 DIAGNOSIS — M79672 Pain in left foot: Secondary | ICD-10-CM | POA: Diagnosis not present

## 2015-05-08 DIAGNOSIS — K219 Gastro-esophageal reflux disease without esophagitis: Secondary | ICD-10-CM | POA: Insufficient documentation

## 2015-05-08 DIAGNOSIS — Z79899 Other long term (current) drug therapy: Secondary | ICD-10-CM | POA: Insufficient documentation

## 2015-05-08 DIAGNOSIS — X501XXA Overexertion from prolonged static or awkward postures, initial encounter: Secondary | ICD-10-CM | POA: Insufficient documentation

## 2015-05-08 DIAGNOSIS — S99912A Unspecified injury of left ankle, initial encounter: Secondary | ICD-10-CM | POA: Diagnosis present

## 2015-05-08 DIAGNOSIS — G8929 Other chronic pain: Secondary | ICD-10-CM | POA: Diagnosis not present

## 2015-05-08 MED ORDER — HYDROCODONE-ACETAMINOPHEN 5-325 MG PO TABS: 1.0000 | ORAL_TABLET | Freq: Four times a day (QID) | ORAL | Status: DC | PRN

## 2015-05-08 NOTE — ED Notes (Signed)
Patient transported to X-ray 

## 2015-05-08 NOTE — ED Notes (Signed)
Pt sts left foot and ankle pain after twisting while moving yesterday

## 2015-05-08 NOTE — ED Provider Notes (Addendum)
CSN: [id number]     Arrival date & time 05/08/15  0848 History   First MD Initiated Contact with Patient 05/08/15 [phone]     Chief Complaint  Patient presents with  . Ankle Pain     (Consider location/radiation/quality/duration/timing/severity/associated sxs/prior Treatment) Patient is a 35 y.o. male presenting with ankle pain. The history is provided by the patient.  Ankle Pain Location:  Foot Time since incident:  1 day Injury: yes   Mechanism of injury comment:  Was moving yesterday and lifting a lot of heavy things and that night developed left foot pain Foot location:  [place] of L foot and L foot Pain details:    Quality:  Aching and sharp   Radiates to:  Does not radiate   Severity:  Moderate   Onset quality:  Gradual   Duration:  12 hours   Timing:  Constant   Progression:  Worsening Chronicity:  New Prior injury to area:  No Relieved by:  None tried Worsened by:  Bearing weight Ineffective treatments:  Rest Associated symptoms: stiffness   Associated symptoms: no back pain, no decreased ROM, no muscle weakness and no swelling     Past Medical History  Diagnosis Date  . Arthritis   . Depression   . GERD (gastroesophageal reflux disease)   . Hypertension   . Crohn's disease ([place]) history of  . Bipolar affective ([place])   . Schizophrenic disorder ([place])   . Anxiety   . Chronic headaches    Past Surgical History  Procedure Laterality Date  . No past surgeries     Family History  Problem Relation Age of Onset  . Hypertension Mother   . Heart attack Mother   . Stroke Mother   . Alcohol abuse Father   . Mental illness Father   . Multiple sclerosis Father   . Arthritis Maternal Grandmother   . Hypertension Maternal Grandmother   . Heart disease Maternal Grandmother   . Kidney disease Maternal Grandmother   . Diabetes Maternal Grandmother   . Cancer Maternal Grandmother     great, type unknown  . Hyperlipidemia Maternal Grandfather   . Colon polyps Maternal  Grandfather   . Stroke Paternal Grandmother   . Ulcerative colitis Maternal Aunt    Social History  Substance Use Topics  . Smoking status: Former Smoker    Quit date: 10/16/2014  . Smokeless tobacco: Never Used  . Alcohol Use: No    Review of Systems  Musculoskeletal: Positive for stiffness. Negative for back pain.  All other systems reviewed and are negative.     Allergies  Review of patient's allergies indicates no known allergies.  Home Medications   Prior to Admission medications   Medication Sig Start Date End Date Taking? Authorizing Provider  aspirin-acetaminophen-caffeine (EXCEDRIN MIGRAINE) [id number] MG tablet Take 2 tablets by mouth every 6 (six) hours as needed for headache.    Historical Provider, MD  dicyclomine (BENTYL) 10 MG capsule Take 1 capsule (10 mg total) by mouth every 6 (six) hours as needed for spasms (cramping). 10/23/14   Ripudeep Krystal Eaton, MD  HYDROcodone-acetaminophen (VICODIN) 5-325 mg TABS tablet Take 1 tab every 6 hours as needed for pain. 12/19/14   Laban Emperor Zehr, PA-C  Melatonin 2.5 MG CAPS Take 1 each by mouth at bedtime as needed.    Historical Provider, MD  omeprazole (PRILOSEC) 40 MG capsule Take 1 capsule (40 mg total) by mouth daily. 02/14/15   Lori P Hvozdovic, PA-C  predniSONE (DELTASONE) 10  MG tablet Take 3 tablets daily 12/19/14   Jessica D Zehr, PA-C  predniSONE (DELTASONE) 10 MG tablet TAKE 3 TABLETS (30 MG TOTAL) BY MOUTH DAILY WITH BREAKFAST. 01/25/15   Ladene Artist, MD  traMADol (ULTRAM) 50 MG tablet Take 1 tablet (50 mg total) by mouth every 6 (six) hours as needed. 01/16/15   Ladene Artist, MD  traZODone (DESYREL) 50 MG tablet Take 50 mg by mouth at bedtime as needed for sleep.    Historical Provider, MD   BP 145/89 mmHg  Pulse 92  Temp(Src) 98.2 F (36.8 C) (Oral)  Resp 18  SpO2 95% Physical Exam  Constitutional: He is oriented to person, place, and time. He appears well-developed and well-nourished. No distress.  HENT:   Head: Normocephalic and atraumatic.  Cardiovascular: Normal rate.   Pulmonary/Chest: Effort normal.  Musculoskeletal: He exhibits tenderness.       Left ankle: Normal.       Feet:  Neurological: He is alert and oriented to person, place, and time.  Skin: Skin is warm and dry.  Psychiatric: He has a normal mood and affect. His behavior is normal.  Nursing note and vitals reviewed.   ED Course  Procedures (including critical care time) Labs Review Labs Reviewed - No data to display  Imaging Review Dg Foot Complete Left  05/08/2015  CLINICAL DATA:  Pain for 1 day EXAM: LEFT FOOT - COMPLETE 3+ VIEW COMPARISON:  None. FINDINGS: Frontal, oblique, and lateral views were obtained. There is no fracture or dislocation. The joint spaces appear normal. No erosive change. IMPRESSION: No fracture or dislocation.  No appreciable arthropathy. Electronically Signed   By: Lowella Grip III M.D.   On: 05/08/2015 09:44   I have personally reviewed and evaluated these images and lab results as part of my medical decision-making.   EKG Interpretation None      MDM   Final diagnoses:  Foot sprain, left, initial encounter    Patient here complaining of left foot pain that occurred after moving yesterday and lifting heavy items. He does not recall a particular trauma and denies dropping anything on his foot or falling. He has tenderness over the dorsal portion of his distal left foot. Imaging is negative for stress fractures and most likely a ligamentous injury. No ankle involvement at this time. Circulation is intact. Patient given an Ace wrap and postop shoe. He is unable to take NSAIDs due to Crohn's disease    Blanchie Dessert, MD 05/08/15 [place], MD 05/08/15 1029

## 2015-05-08 NOTE — Discharge Instructions (Signed)
Elastic Bandage and RICE WHAT DOES AN ELASTIC BANDAGE DO? Elastic bandages come in different shapes and sizes. They generally provide support to your injury and reduce swelling while you are healing, but they can perform different functions. Your health care provider will help you to decide what is best for your protection, recovery, or rehabilitation following an injury. WHAT ARE SOME GENERAL TIPS FOR USING AN ELASTIC BANDAGE?  Use the bandage as directed by the maker of the bandage that you are using.  Do not wrap the bandage too tightly. This may cut off the circulation in the arm or leg in the area below the bandage.  If part of your body beyond the bandage becomes blue, numb, cold, swollen, or is more painful, your bandage is most likely too tight. If this occurs, remove your bandage and reapply it more loosely.  See your health care provider if the bandage seems to be making your problems worse rather than better.  An elastic bandage should be removed and reapplied every 3-4 hours or as directed by your health care provider. WHAT IS RICE? The routine care of many injuries includes rest, ice, compression, and elevation (RICE therapy).  Rest Rest is required to allow your body to heal. Generally, you can resume your routine activities when you are comfortable and have been given permission by your health care provider. Ice Icing your injury helps to keep the swelling down and it reduces pain. Do not apply ice directly to your skin.  Put ice in a plastic bag.  Place a towel between your skin and the bag.  Leave the ice on for 20 minutes, 2-3 times per day. Do this for as long as you are directed by your health care provider. Compression Compression helps to keep swelling down, gives support, and helps with discomfort. Compression may be done with an elastic bandage. Elevation Elevation helps to reduce swelling and it decreases pain. If possible, your injured area should be placed at  or above the level of your heart or the center of your chest. [place]? You should seek medical care if:  You have persistent pain and swelling.  Your symptoms are getting worse rather than improving. These symptoms may indicate that further evaluation or further X-rays are needed. Sometimes, X-rays may not show a small broken bone (fracture) until a number of days later. Make a follow-up appointment with your health care provider. Ask when your X-ray results will be ready. Make sure that you get your X-ray results. WHEN SHOULD I SEEK IMMEDIATE MEDICAL CARE? You should seek immediate medical care if:  You have a sudden onset of severe pain at or below the area of your injury.  You develop redness or increased swelling around your injury.  You have tingling or numbness at or below the area of your injury that does not improve after you remove the elastic bandage.   This information is not intended to replace advice given to you by your health care provider. Make sure you discuss any questions you have with your health care provider.   Document Released: 07/12/2001 Document Revised: 10/11/2014 Document Reviewed: 09/05/2013 Elsevier Interactive Patient Education 2016 [place] Sprain A foot sprain is an injury to one of the strong bands of tissue (ligaments) that connect and support the many bones in your feet. The ligament can be stretched too much or it can tear. A tear can be either partial or complete. The severity of the sprain  depends on how much of the ligament was damaged or torn. CAUSES A foot sprain is usually caused by suddenly twisting or pivoting your foot. RISK FACTORS This injury is more likely to occur in people who:  Play a sport, such as basketball or football.  Exercise or play a sport without warming up.  Start a new workout or sport.  Suddenly increase how long or hard they exercise or play a sport. SYMPTOMS Symptoms of this  condition start soon after an injury and include:  Pain, especially in the arch of the foot.  Bruising.  Swelling.  Inability to walk or use the foot to support body weight. DIAGNOSIS This condition is diagnosed with a medical history and physical exam. You may also have imaging tests, such as:  X-rays to make sure there are no broken bones (fractures).  MRI to see if the ligament has torn. TREATMENT Treatment varies depending on the severity of your sprain. Mild sprains can be treated with rest, ice, compression, and elevation (RICE). If your ligament is overstretched or partially torn, treatment usually involves keeping your foot in a fixed position (immobilization) for a period of time. To help you do this, your health care provider will apply a bandage, splint, or walking boot to keep your foot from moving until it heals. You may also be advised to use crutches or a scooter for a few weeks to avoid bearing weight on your foot while it is healing. If your ligament is fully torn, you may need surgery to reconnect the ligament to the bone. After surgery, a cast or splint will be applied and will need to stay on your foot while it heals. Your health care provider may also suggest exercises or physical therapy to strengthen your foot. HOME CARE INSTRUCTIONS If You Have a Bandage, Splint, or Walking Boot:  Wear it as directed by your health care provider. Remove it only as directed by your health care provider.  Loosen the bandage, splint, or walking boot if your toes become numb and tingle, or if they turn cold and blue. Bathing  If your health care provider approves bathing and showering, cover the bandage or splint with a watertight plastic bag to protect it from water. Do not let the bandage or splint get wet. Managing Pain, Stiffness, and Swelling   If directed, apply ice to the injured area:  Put ice in a plastic bag.  Place a towel between your skin and the bag.  Leave the  ice on for 20 minutes, 2-3 times per day.  Move your toes often to avoid stiffness and to lessen swelling.  Raise (elevate) the injured area above the level of your heart while you are sitting or lying down. Driving  Do not drive or operate heavy machinery while taking pain medicine.  Do not drive while wearing a bandage, splint, or walking boot on a foot that you use for driving. Activity  Rest as directed by your health care provider.  Do not use the injured foot to support your body weight until your health care provider says that you can. Use crutches or other supportive devices as directed by your health care provider.  Ask your health care provider what activities are safe for you. Gradually increase how much and how far you walk until your health care provider says it is safe to return to full activity.  Do any exercise or physical therapy as directed by your health care provider. General Instructions  If a splint  was applied, do not put pressure on any part of it until it is fully hardened. This may take several hours.  Take medicines only as directed by your health care provider. These include over-the-counter medicines and prescription medicines.  Keep all follow-up visits as directed by your health care provider. This is important.  When you can walk without pain, wear supportive shoes that have stiff soles. Do not wear flip-flops, and do not walk barefoot. SEEK MEDICAL CARE IF:  Your pain is not controlled with medicine.  Your bruising or swelling gets worse or does not get better with treatment.  Your splint or walking boot is damaged. SEEK IMMEDIATE MEDICAL CARE IF:  Your foot is numb or blue.  Your foot feels colder than normal.   This information is not intended to replace advice given to you by your health care provider. Make sure you discuss any questions you have with your health care provider.   Document Released: 07/12/2001 Document Revised: 06/06/2014  Document Reviewed: 11/23/2013 Elsevier Interactive Patient Education [employer].

## 2015-06-04 DIAGNOSIS — R45851 Suicidal ideations: Secondary | ICD-10-CM | POA: Diagnosis present

## 2015-06-04 DIAGNOSIS — F3181 Bipolar II disorder: Secondary | ICD-10-CM | POA: Diagnosis present

## 2015-06-04 DIAGNOSIS — F3130 Bipolar disorder, current episode depressed, mild or moderate severity, unspecified: Secondary | ICD-10-CM | POA: Diagnosis not present

## 2015-06-04 DIAGNOSIS — F332 Major depressive disorder, recurrent severe without psychotic features: Secondary | ICD-10-CM | POA: Diagnosis not present

## 2015-06-04 DIAGNOSIS — K5090 Crohn's disease, unspecified, without complications: Secondary | ICD-10-CM | POA: Diagnosis present

## 2015-06-12 DIAGNOSIS — F319 Bipolar disorder, unspecified: Secondary | ICD-10-CM | POA: Diagnosis not present

## 2015-07-14 DIAGNOSIS — R197 Diarrhea, unspecified: Secondary | ICD-10-CM | POA: Diagnosis not present

## 2015-07-14 DIAGNOSIS — R109 Unspecified abdominal pain: Secondary | ICD-10-CM | POA: Diagnosis not present

## 2015-07-14 DIAGNOSIS — K5090 Crohn's disease, unspecified, without complications: Secondary | ICD-10-CM | POA: Diagnosis not present

## 2015-08-01 DIAGNOSIS — T380X5A Adverse effect of glucocorticoids and synthetic analogues, initial encounter: Secondary | ICD-10-CM | POA: Diagnosis not present

## 2015-08-01 DIAGNOSIS — K50911 Crohn's disease, unspecified, with rectal bleeding: Secondary | ICD-10-CM | POA: Diagnosis not present

## 2015-08-01 DIAGNOSIS — F319 Bipolar disorder, unspecified: Secondary | ICD-10-CM | POA: Diagnosis not present

## 2015-08-01 DIAGNOSIS — F17200 Nicotine dependence, unspecified, uncomplicated: Secondary | ICD-10-CM | POA: Diagnosis present

## 2015-08-01 DIAGNOSIS — R739 Hyperglycemia, unspecified: Secondary | ICD-10-CM | POA: Diagnosis present

## 2015-08-01 DIAGNOSIS — R1084 Generalized abdominal pain: Secondary | ICD-10-CM | POA: Diagnosis not present

## 2015-08-01 DIAGNOSIS — D72829 Elevated white blood cell count, unspecified: Secondary | ICD-10-CM | POA: Diagnosis not present

## 2015-08-01 DIAGNOSIS — K6389 Other specified diseases of intestine: Secondary | ICD-10-CM | POA: Diagnosis not present

## 2015-08-01 DIAGNOSIS — R7309 Other abnormal glucose: Secondary | ICD-10-CM | POA: Diagnosis not present

## 2015-08-01 DIAGNOSIS — K5090 Crohn's disease, unspecified, without complications: Secondary | ICD-10-CM | POA: Diagnosis not present

## 2015-08-01 DIAGNOSIS — K50011 Crohn's disease of small intestine with rectal bleeding: Secondary | ICD-10-CM | POA: Diagnosis not present

## 2015-08-01 DIAGNOSIS — K50012 Crohn's disease of small intestine with intestinal obstruction: Secondary | ICD-10-CM | POA: Diagnosis not present

## 2015-10-13 DIAGNOSIS — K632 Fistula of intestine: Secondary | ICD-10-CM | POA: Diagnosis not present

## 2015-10-13 DIAGNOSIS — K5090 Crohn's disease, unspecified, without complications: Secondary | ICD-10-CM | POA: Diagnosis not present

## 2015-10-13 DIAGNOSIS — R1084 Generalized abdominal pain: Secondary | ICD-10-CM | POA: Diagnosis not present

## 2015-10-13 DIAGNOSIS — L988 Other specified disorders of the skin and subcutaneous tissue: Secondary | ICD-10-CM | POA: Diagnosis not present

## 2015-10-13 DIAGNOSIS — R109 Unspecified abdominal pain: Secondary | ICD-10-CM | POA: Diagnosis not present

## 2015-10-13 DIAGNOSIS — K6389 Other specified diseases of intestine: Secondary | ICD-10-CM | POA: Diagnosis not present

## 2015-10-14 DIAGNOSIS — K6389 Other specified diseases of intestine: Secondary | ICD-10-CM | POA: Diagnosis not present

## 2015-10-16 DIAGNOSIS — D124 Benign neoplasm of descending colon: Secondary | ICD-10-CM | POA: Diagnosis not present

## 2015-10-16 DIAGNOSIS — K50913 Crohn's disease, unspecified, with fistula: Secondary | ICD-10-CM | POA: Diagnosis not present

## 2015-10-16 DIAGNOSIS — K635 Polyp of colon: Secondary | ICD-10-CM | POA: Diagnosis not present

## 2015-10-16 DIAGNOSIS — K50919 Crohn's disease, unspecified, with unspecified complications: Secondary | ICD-10-CM | POA: Diagnosis not present

## 2015-10-16 DIAGNOSIS — R194 Change in bowel habit: Secondary | ICD-10-CM | POA: Diagnosis not present

## 2015-10-16 DIAGNOSIS — K2900 Acute gastritis without bleeding: Secondary | ICD-10-CM | POA: Diagnosis not present

## 2015-10-16 DIAGNOSIS — K5140 Inflammatory polyps of colon without complications: Secondary | ICD-10-CM | POA: Diagnosis not present

## 2015-10-16 DIAGNOSIS — K219 Gastro-esophageal reflux disease without esophagitis: Secondary | ICD-10-CM | POA: Diagnosis not present

## 2015-10-16 DIAGNOSIS — D72829 Elevated white blood cell count, unspecified: Secondary | ICD-10-CM | POA: Diagnosis not present

## 2015-10-16 DIAGNOSIS — K5660 Unspecified intestinal obstruction: Secondary | ICD-10-CM | POA: Diagnosis not present

## 2015-10-16 DIAGNOSIS — K5000 Crohn's disease of small intestine without complications: Secondary | ICD-10-CM | POA: Diagnosis not present

## 2015-10-16 DIAGNOSIS — K529 Noninfective gastroenteritis and colitis, unspecified: Secondary | ICD-10-CM | POA: Diagnosis not present

## 2015-10-16 DIAGNOSIS — R918 Other nonspecific abnormal finding of lung field: Secondary | ICD-10-CM | POA: Diagnosis not present

## 2015-10-16 DIAGNOSIS — R1084 Generalized abdominal pain: Secondary | ICD-10-CM | POA: Diagnosis not present

## 2015-10-16 DIAGNOSIS — R1013 Epigastric pain: Secondary | ICD-10-CM | POA: Diagnosis not present

## 2015-10-16 DIAGNOSIS — F3189 Other bipolar disorder: Secondary | ICD-10-CM | POA: Diagnosis not present

## 2015-10-16 DIAGNOSIS — K2950 Unspecified chronic gastritis without bleeding: Secondary | ICD-10-CM | POA: Diagnosis not present

## 2015-10-16 DIAGNOSIS — K449 Diaphragmatic hernia without obstruction or gangrene: Secondary | ICD-10-CM | POA: Diagnosis not present

## 2015-10-16 DIAGNOSIS — K209 Esophagitis, unspecified: Secondary | ICD-10-CM | POA: Diagnosis not present

## 2015-10-16 DIAGNOSIS — R197 Diarrhea, unspecified: Secondary | ICD-10-CM | POA: Diagnosis not present

## 2015-10-16 DIAGNOSIS — Z8659 Personal history of other mental and behavioral disorders: Secondary | ICD-10-CM | POA: Diagnosis not present

## 2015-10-16 DIAGNOSIS — K2960 Other gastritis without bleeding: Secondary | ICD-10-CM | POA: Diagnosis not present

## 2015-10-16 DIAGNOSIS — R109 Unspecified abdominal pain: Secondary | ICD-10-CM | POA: Diagnosis not present

## 2015-10-16 DIAGNOSIS — K50118 Crohn's disease of large intestine with other complication: Secondary | ICD-10-CM | POA: Diagnosis not present

## 2015-10-17 DIAGNOSIS — Z8659 Personal history of other mental and behavioral disorders: Secondary | ICD-10-CM | POA: Diagnosis not present

## 2015-10-17 DIAGNOSIS — K5090 Crohn's disease, unspecified, without complications: Secondary | ICD-10-CM | POA: Diagnosis not present

## 2015-10-17 DIAGNOSIS — R197 Diarrhea, unspecified: Secondary | ICD-10-CM | POA: Diagnosis not present

## 2015-10-17 DIAGNOSIS — K50913 Crohn's disease, unspecified, with fistula: Secondary | ICD-10-CM | POA: Diagnosis not present

## 2015-10-17 DIAGNOSIS — K632 Fistula of intestine: Secondary | ICD-10-CM | POA: Diagnosis not present

## 2015-10-17 DIAGNOSIS — K529 Noninfective gastroenteritis and colitis, unspecified: Secondary | ICD-10-CM | POA: Diagnosis not present

## 2015-11-01 DIAGNOSIS — R197 Diarrhea, unspecified: Secondary | ICD-10-CM | POA: Diagnosis not present

## 2015-11-01 DIAGNOSIS — R1013 Epigastric pain: Secondary | ICD-10-CM | POA: Diagnosis not present

## 2015-11-01 DIAGNOSIS — K5080 Crohn's disease of both small and large intestine without complications: Secondary | ICD-10-CM | POA: Diagnosis not present

## 2015-11-01 DIAGNOSIS — R140 Abdominal distension (gaseous): Secondary | ICD-10-CM | POA: Diagnosis not present

## 2015-11-20 DIAGNOSIS — R112 Nausea with vomiting, unspecified: Secondary | ICD-10-CM | POA: Diagnosis not present

## 2015-11-20 DIAGNOSIS — F319 Bipolar disorder, unspecified: Secondary | ICD-10-CM | POA: Diagnosis not present

## 2015-11-20 DIAGNOSIS — R197 Diarrhea, unspecified: Secondary | ICD-10-CM | POA: Diagnosis not present

## 2015-11-20 DIAGNOSIS — R030 Elevated blood-pressure reading, without diagnosis of hypertension: Secondary | ICD-10-CM | POA: Diagnosis not present

## 2015-11-20 DIAGNOSIS — K529 Noninfective gastroenteritis and colitis, unspecified: Secondary | ICD-10-CM | POA: Diagnosis not present

## 2015-11-20 DIAGNOSIS — R1013 Epigastric pain: Secondary | ICD-10-CM | POA: Diagnosis not present

## 2015-11-20 DIAGNOSIS — K50013 Crohn's disease of small intestine with fistula: Secondary | ICD-10-CM | POA: Diagnosis not present

## 2015-11-20 DIAGNOSIS — K5090 Crohn's disease, unspecified, without complications: Secondary | ICD-10-CM | POA: Diagnosis not present

## 2015-11-20 DIAGNOSIS — R109 Unspecified abdominal pain: Secondary | ICD-10-CM | POA: Diagnosis not present

## 2015-11-20 DIAGNOSIS — K5080 Crohn's disease of both small and large intestine without complications: Secondary | ICD-10-CM | POA: Diagnosis not present

## 2015-11-20 DIAGNOSIS — F17290 Nicotine dependence, other tobacco product, uncomplicated: Secondary | ICD-10-CM | POA: Diagnosis not present

## 2015-11-21 DIAGNOSIS — R197 Diarrhea, unspecified: Secondary | ICD-10-CM | POA: Diagnosis not present

## 2015-11-21 DIAGNOSIS — K5080 Crohn's disease of both small and large intestine without complications: Secondary | ICD-10-CM | POA: Diagnosis not present

## 2015-11-21 DIAGNOSIS — K5090 Crohn's disease, unspecified, without complications: Secondary | ICD-10-CM | POA: Diagnosis not present

## 2015-11-21 DIAGNOSIS — R1013 Epigastric pain: Secondary | ICD-10-CM | POA: Diagnosis not present

## 2015-11-21 DIAGNOSIS — R112 Nausea with vomiting, unspecified: Secondary | ICD-10-CM | POA: Diagnosis not present

## 2015-11-26 DIAGNOSIS — F319 Bipolar disorder, unspecified: Secondary | ICD-10-CM | POA: Diagnosis not present

## 2016-10-15 ENCOUNTER — Emergency Department (HOSPITAL_COMMUNITY): Payer: Medicare Other

## 2016-10-15 ENCOUNTER — Encounter (HOSPITAL_COMMUNITY): Payer: Self-pay | Admitting: Emergency Medicine

## 2016-10-15 ENCOUNTER — Inpatient Hospital Stay (HOSPITAL_COMMUNITY)
Admission: EM | Admit: 2016-10-15 | Discharge: 2016-10-17 | DRG: 387 | Disposition: A | Discharge status: Home / Self Care | Payer: Medicare Other | Attending: Family Medicine | Admitting: Family Medicine

## 2016-10-15 DIAGNOSIS — F319 Bipolar disorder, unspecified: Secondary | ICD-10-CM | POA: Diagnosis present

## 2016-10-15 DIAGNOSIS — Z6829 Body mass index (BMI) 29.0-29.9, adult: Secondary | ICD-10-CM | POA: Diagnosis not present

## 2016-10-15 DIAGNOSIS — Z823 Family history of stroke: Secondary | ICD-10-CM | POA: Diagnosis not present

## 2016-10-15 DIAGNOSIS — Z87891 Personal history of nicotine dependence: Secondary | ICD-10-CM | POA: Diagnosis not present

## 2016-10-15 DIAGNOSIS — I10 Essential (primary) hypertension: Secondary | ICD-10-CM | POA: Diagnosis present

## 2016-10-15 DIAGNOSIS — Z9119 Patient's noncompliance with other medical treatment and regimen: Secondary | ICD-10-CM

## 2016-10-15 DIAGNOSIS — K219 Gastro-esophageal reflux disease without esophagitis: Secondary | ICD-10-CM | POA: Diagnosis present

## 2016-10-15 DIAGNOSIS — Z818 Family history of other mental and behavioral disorders: Secondary | ICD-10-CM

## 2016-10-15 DIAGNOSIS — R1030 Lower abdominal pain, unspecified: Secondary | ICD-10-CM | POA: Diagnosis present

## 2016-10-15 DIAGNOSIS — Z833 Family history of diabetes mellitus: Secondary | ICD-10-CM

## 2016-10-15 DIAGNOSIS — Z8249 Family history of ischemic heart disease and other diseases of the circulatory system: Secondary | ICD-10-CM

## 2016-10-15 DIAGNOSIS — K50113 Crohn's disease of large intestine with fistula: Secondary | ICD-10-CM

## 2016-10-15 DIAGNOSIS — K5080 Crohn's disease of both small and large intestine without complications: Secondary | ICD-10-CM | POA: Diagnosis present

## 2016-10-15 DIAGNOSIS — K5010 Crohn's disease of large intestine without complications: Secondary | ICD-10-CM | POA: Diagnosis not present

## 2016-10-15 DIAGNOSIS — E669 Obesity, unspecified: Secondary | ICD-10-CM | POA: Diagnosis present

## 2016-10-15 DIAGNOSIS — K50119 Crohn's disease of large intestine with unspecified complications: Secondary | ICD-10-CM | POA: Diagnosis present

## 2016-10-15 LAB — URINALYSIS, ROUTINE W REFLEX MICROSCOPIC
Bilirubin Urine: NEGATIVE
GLUCOSE, UA: NEGATIVE mg/dL
Hgb urine dipstick: NEGATIVE
Ketones, ur: NEGATIVE mg/dL
LEUKOCYTES UA: NEGATIVE
Nitrite: NEGATIVE
PROTEIN: NEGATIVE mg/dL
Specific Gravity, Urine: 1.013 (ref 1.005–1.030)
pH: 5 (ref 5.0–8.0)

## 2016-10-15 LAB — CBC
HEMATOCRIT: 47.1 % (ref 39.0–52.0)
Hemoglobin: 17.2 g/dL — ABNORMAL HIGH (ref 13.0–17.0)
MCH: 32 pg (ref 26.0–34.0)
MCHC: 36.5 g/dL — ABNORMAL HIGH (ref 30.0–36.0)
MCV: 87.5 fL (ref 78.0–100.0)
PLATELETS: 246 10*3/uL (ref 150–400)
RBC: 5.38 MIL/uL (ref 4.22–5.81)
RDW: 13.4 % (ref 11.5–15.5)
WBC: 6.8 10*3/uL (ref 4.0–10.5)

## 2016-10-15 LAB — COMPREHENSIVE METABOLIC PANEL
ALT: 26 U/L (ref 17–63)
ANION GAP: 7 (ref 5–15)
AST: 22 U/L (ref 15–41)
Albumin: 3.7 g/dL (ref 3.5–5.0)
Alkaline Phosphatase: 96 U/L (ref 38–126)
BUN: 6 mg/dL (ref 6–20)
CHLORIDE: 110 mmol/L (ref 101–111)
CO2: 24 mmol/L (ref 22–32)
Calcium: 8.8 mg/dL — ABNORMAL LOW (ref 8.9–10.3)
Creatinine, Ser: 0.92 mg/dL (ref 0.61–1.24)
Glucose, Bld: 113 mg/dL — ABNORMAL HIGH (ref 65–99)
Potassium: 4 mmol/L (ref 3.5–5.1)
Sodium: 141 mmol/L (ref 135–145)
Total Bilirubin: 0.3 mg/dL (ref 0.3–1.2)
Total Protein: 7 g/dL (ref 6.5–8.1)

## 2016-10-15 LAB — LIPASE, BLOOD: Lipase: 38 U/L (ref 11–51)

## 2016-10-15 MED ORDER — HYDROMORPHONE HCL 1 MG/ML IJ SOLN
1.0000 mg | Freq: Once | INTRAMUSCULAR | Status: AC
  Administered 2016-10-15: 1 mg via INTRAVENOUS
  Filled 2016-10-15: qty 1

## 2016-10-15 MED ORDER — SODIUM CHLORIDE 0.9% FLUSH
3.0000 mL | Freq: Two times a day (BID) | INTRAVENOUS | Status: DC
  Administered 2016-10-15 – 2016-10-17 (×2): 3 mL via INTRAVENOUS

## 2016-10-15 MED ORDER — METHYLPREDNISOLONE SODIUM SUCC 125 MG IJ SOLR
60.0000 mg | Freq: Two times a day (BID) | INTRAMUSCULAR | Status: DC
  Administered 2016-10-15 – 2016-10-17 (×4): 60 mg via INTRAVENOUS
  Filled 2016-10-15 (×4): qty 2

## 2016-10-15 MED ORDER — HYDROMORPHONE HCL 1 MG/ML IJ SOLN
1.0000 mg | INTRAMUSCULAR | Status: DC | PRN
  Administered 2016-10-15 – 2016-10-17 (×10): 1 mg via INTRAVENOUS
  Filled 2016-10-15 (×10): qty 1

## 2016-10-15 MED ORDER — ONDANSETRON HCL 4 MG/2ML IJ SOLN
4.0000 mg | Freq: Four times a day (QID) | INTRAMUSCULAR | Status: DC | PRN
  Administered 2016-10-15: 4 mg via INTRAVENOUS
  Filled 2016-10-15: qty 2

## 2016-10-15 MED ORDER — MORPHINE SULFATE (PF) 4 MG/ML IV SOLN
4.0000 mg | Freq: Once | INTRAVENOUS | Status: DC
  Filled 2016-10-15: qty 1

## 2016-10-15 MED ORDER — IOPAMIDOL (ISOVUE-300) INJECTION 61%
INTRAVENOUS | Status: AC
  Administered 2016-10-15: 80 mL via INTRAVENOUS
  Filled 2016-10-15: qty 100

## 2016-10-15 MED ORDER — SODIUM CHLORIDE 0.9 % IV BOLUS (SEPSIS)
1000.0000 mL | Freq: Once | INTRAVENOUS | Status: AC
  Administered 2016-10-15: 1000 mL via INTRAVENOUS

## 2016-10-15 MED ORDER — LACTATED RINGERS IV SOLN
INTRAVENOUS | Status: DC
  Administered 2016-10-15 – 2016-10-16 (×4): via INTRAVENOUS

## 2016-10-15 MED ORDER — DICYCLOMINE HCL 10 MG PO CAPS: 10.0000 mg | ORAL_CAPSULE | Freq: Four times a day (QID) | ORAL | Status: DC | PRN

## 2016-10-15 MED ORDER — PANTOPRAZOLE SODIUM 40 MG PO TBEC: 40.0000 mg | DELAYED_RELEASE_TABLET | Freq: Every day | ORAL | Status: DC

## 2016-10-15 MED ORDER — ONDANSETRON HCL 4 MG/2ML IJ SOLN
4.0000 mg | Freq: Once | INTRAMUSCULAR | Status: AC
  Administered 2016-10-15: 4 mg via INTRAVENOUS
  Filled 2016-10-15: qty 2

## 2016-10-15 MED ORDER — LACTATED RINGERS IV SOLN
INTRAVENOUS | Status: DC
  Administered 2016-10-15: 22:00:00 via INTRAVENOUS

## 2016-10-15 NOTE — H&P (Signed)
Jeffery Mcintosh DIY:[id number] DOB: 09-03-1980 DOA: 10/15/2016  Referring physician: Mr Cathi Roan PCP: Golden Circle, FNP  Specialists: GI, general surgery  Chief Complaint: Severe lower quadrant abdominal pain  HPI: Jeffery Mcintosh is a 36 y.o. male  African-American Past history of eye polar, diagnosed with Crohn's disease at age 89 Previously followed by Dr. Fuller Plan of gastroenterology last follow-up 2016 and saw Dr. Truman Hayward in [facility] as well  The plan at the time based on 11 2016 note was to start Remicade and he was on prednisone 30 as a long-term taper Works for [employer] recently got married and for couple of months to [place] where he continued his gastroenterology care as placed on Humira and moved back about a month his last dose of Humira was 3 weeks ago and was taking this consistently and regularly that ran out of this medication. On around 9/8 patient developed severe abdominal pain along with episodic diarrhea X5 times per day Diarrhea was tinged with blood on wiping was watery he had some nausea and he has had some anorexia over the course of the past couple of days. 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 Anxiety   . Arthritis   . Bipolar affective ([place])   . Chronic headaches   . Crohn's disease ([place]) history of  . Depression   . GERD (gastroesophageal reflux disease)   . Hypertension   . Schizophrenic disorder [facility])    Past Surgical History:  Procedure Laterality Date  . NO PAST SURGERIES     Social History:  reports that he quit smoking about 2 years ago. He has never used smokeless tobacco. He reports that he does not drink alcohol or use drugs.   No Known Allergies  Family History  Problem Relation Age of Onset  . Hypertension Mother   . Heart attack Mother   . Stroke Mother   . Alcohol abuse Father   . Mental illness Father   . Multiple sclerosis Father   . Arthritis Maternal Grandmother   . Hypertension Maternal Grandmother   . Heart disease Maternal Grandmother   . Kidney disease Maternal Grandmother   . Diabetes Maternal Grandmother   . Cancer Maternal Grandmother        great, type unknown  . Hyperlipidemia Maternal Grandfather   . Colon polyps Maternal Grandfather   .  Stroke Paternal Grandmother   . Ulcerative colitis Maternal Aunt     Prior to Admission medications   Medication Sig Start Date End Date Taking? Authorizing Provider  Melatonin 2.5 MG CAPS Take 2.5 mg by mouth at bedtime as needed (sleep).    Yes [provider]  dicyclomine (BENTYL) 10 MG capsule Take 1 capsule (10 mg total) by mouth every 6 (six) hours as needed for spasms (cramping). Patient not taking: Reported on 10/15/2016 10/23/14   Rai, Vernelle Emerald, MD  HYDROcodone-acetaminophen (NORCO/VICODIN) 5-325 MG tablet Take 1-2 tablets by mouth every 6 (six) hours as needed for severe pain. Patient not  taking: Reported on 10/15/2016 05/08/15   Blanchie Dessert, MD  omeprazole (PRILOSEC) 40 MG capsule Take 1 capsule (40 mg total) by mouth daily. Patient not taking: Reported on 10/15/2016 02/14/15   Hvozdovic, Lori P, PA-C  predniSONE (DELTASONE) 10 MG tablet Take 3 tablets daily Patient not taking: Reported on 10/15/2016 12/19/14   Zehr, Janett Billow D, PA-C  predniSONE (DELTASONE) 10 MG tablet TAKE 3 TABLETS (30 MG TOTAL) BY MOUTH DAILY WITH BREAKFAST. Patient not taking: Reported on 10/15/2016 01/25/15   Ladene Artist, MD  traMADol (ULTRAM) 50 MG tablet Take 1 tablet (50 mg total) by mouth every 6 (six) hours as needed. Patient not taking: Reported on 10/15/2016 01/16/15   Ladene Artist, MD   Physical Exam: Vitals:   10/15/16 1645 10/15/16 1700  BP: 136/80 139/85  Pulse: 65 67  Resp:    Temp:    SpO2: 96% 97%   Alert pleasant oriented and noticed that present S1 and S2 no murmur or gallop Abdomen tender in lower quadrant bowel sounds increased Chest is clinically clear with no added sound Lower extremities and soft nontender no swelling Rectal exam deferred Skin normal Neurologically intact able to move all 4 limbs equally  Labs on Admission:  Basic Metabolic Panel:  Recent Labs Lab 10/15/16 0447  NA 141  K 4.0  CL 110  CO2 24  GLUCOSE 113*  BUN 6  CREATININE 0.92  CALCIUM 8.8*   Liver Function Tests:  Recent Labs Lab 10/15/16 0447  AST 22  ALT 26  ALKPHOS 96  BILITOT 0.3  PROT 7.0  ALBUMIN 3.7    Recent Labs Lab 10/15/16 0447  LIPASE 38   No results for input(s): AMMONIA in the last 168 hours. CBC:  Recent Labs Lab 10/15/16 0447  WBC 6.8  HGB 17.2*  HCT 47.1  MCV 87.5  PLT 246   Cardiac Enzymes: No results for input(s): CKTOTAL, CKMB, CKMBINDEX, TROPONINI in the last 168 hours.  BNP (last 3 results) No results for input(s): BNP in the last 8760 hours.  ProBNP (last 3 results) No results for input(s): PROBNP in the last 8760  hours.  CBG: No results for input(s): GLUCAP in the last 168 hours.  Radiological Exams on Admission: Ct Abdomen Pelvis W Contrast  Result Date: 10/15/2016 CLINICAL DATA:  History of Crohn's disease, chronic. Lower abdominal pain and nausea EXAM: CT ABDOMEN AND PELVIS WITH CONTRAST TECHNIQUE: Multidetector CT imaging of the abdomen and pelvis was performed using the standard protocol following bolus administration of intravenous contrast. CONTRAST:  75 mL  ISOVUE-300 IOPAMIDOL (ISOVUE-300) INJECTION 61% COMPARISON:  December 18, 2014 FINDINGS: Lower chest:  The lung bases are clear. Hepatobiliary: No liver lesions are evident on this current study. Gallbladder wall is not appreciably thickened. There is no biliary duct dilatation. Pancreas: There is no pancreatic mass or inflammatory focus.  Spleen: No splenic lesions are evident. Spleen is normal in size and contour. Adrenals/Urinary Tract: Adrenals appear normal bilaterally. Kidneys bilaterally show no evident mass or hydronephrosis on either side. There is no renal or ureteral calculus on either side. Urinary bladder is midline with wall thickness within normal limits. Stomach/Bowel: There is wall thickening in several areas of mid and distal ileum and cecum consistent with known Crohn's disease. 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 This finding is best evident on axial slice 50 series 3, sagittal slice 43 series 7, and coronal slice 38 series 6. There is no evidence of bowel obstruction or abscess. Bowel loops elsewhere do not show significant thickening. Vascular/Lymphatic: There is no abdominal aortic aneurysm. No vascular lesions are evident. No adenopathy is appreciable in the abdomen or pelvis. Reproductive: Prostate and seminal vesicles appear normal in size and contour. There is no evident pelvic mass. Other: No  abscess or ascites is evident in the abdomen or pelvis. The appendix is not appreciable. There is no mesenteric thickening in the region of the appendix. Musculoskeletal: No demonstrable sacroiliitis. No blastic or lytic bone lesions. No intramuscular or abdominal wall lesion evident. IMPRESSION: 1. 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 There is no new bowel thickening in this area. No abscess or evidence of perforation. No more proximal bowel wall thickening. No bowel obstruction seen on this study. No abscess elsewhere in the abdomen or pelvis. No ascites. 2. No renal or ureteral calculus. No hydronephrosis. No biliary duct dilatation or gallbladder wall thickening. 3.  No demonstrable sacroiliitis. Electronically Signed   By: Lowella Grip III M.D.   On: 10/15/2016 11:47    EKG: Independently reviewed. none  Assessment/Plan Active Problems:   Crohn's colitis, unspecified complication (HCC)  Crohn's colitis flare  Start Solu-Medrol 60 twice a day  Lactated Ringer 125 cc per hour  Keep nothing by mouth until Dr. Paulita Fujita of gastroenterology sees the patient-have explained rationale of not  irritating the abdomen for the time being  Trend CBC-expect he has some blood in the stool from gastric irritation if this becomes overt or occult he  will need type and screen and possible transfusion based on lab work  He will probably need an eventual colonoscopy when he comes down and he wouldn't need to decide  where he wants to get care  Will need outpatient discussion once stabilizing regarding anti-TNF agents and so until that time probably  will need tapering doses of steroids  Can continue Bentyl for now defer to GI  Bipolar  Not on meds needs outpatient follow-up  Reflux Placed on parenteral pantoprazole 40 daily   Time spent: 50 Discussed with friend at the bedside Inpatient as  will need 2-3 days management of pain Appreciate GI input in advance   [place], [place] [facility] Pager [phone]  If 7PM-7AM, please contact night-coverage [email or web address] Password [facility] 10/15/2016, 5:37 PM

## 2016-10-15 NOTE — Consult Note (Signed)
[facility] Surgery Consult Note  Jeffery Mcintosh [birth year]  [id number].    Requesting MD: Dolly Rias Chief Complaint/Reason for Consult: Crohns  HPI:  Jeffery Mcintosh is a 36yo male PMH significant for Crohn's, who presented to the [facility] earlier today with 1 week of progressive central abdominal pain. States that the pain is constant. It has gradually gotten worse, now sharp and severe. He also reports nausea and 2 episodes of loose bloody stools. Denies fever, chills, emesis, diarrhea.   Mr. Negron recently moved here from [place]. He was followed by GI there and was taking Humira since 2017 until 3 weeks ago. He now lives in [place]. but has not reestablished GI care. He did see Dr. Fuller Plan here in [place] and Dr. Truman Hayward in [facility], [place] during 2016, and plans to see Dr. Truman Hayward again now that he has moved back to the area. States that he has not had a flare up or been on prednisone for Crohn's since 2016.  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 Fistula was present on prior CT - WBC 6.8  PMH significant for Crohns, GERD Abdominal surgical history: none Last colonoscopy 11/2014 Anticoagulants: none 4-5 Black and mild daily Employment: in the warehouse at [employer]: Review of Systems  Constitutional: Negative.   HENT: Negative.   Eyes: Negative.   Respiratory: Negative.   Cardiovascular: Negative.   Gastrointestinal: Positive for abdominal pain, blood in stool, diarrhea and nausea. Negative for constipation, heartburn and vomiting.  Genitourinary: Negative.   Musculoskeletal: Negative.   Skin: Negative.   Neurological: Negative.   All systems reviewed and otherwise negative except for as above  Family History  Problem Relation Age of Onset  . Hypertension Mother   .  Heart attack Mother   . Stroke Mother   . Alcohol abuse Father   . Mental illness Father   . Multiple sclerosis Father   . Arthritis Maternal Grandmother   . Hypertension Maternal Grandmother   . Heart disease Maternal Grandmother   . Kidney disease Maternal Grandmother   . Diabetes Maternal Grandmother   . Cancer Maternal Grandmother        great, type unknown  . Hyperlipidemia Maternal Grandfather   . Colon polyps Maternal Grandfather   . Stroke Paternal Grandmother   . Ulcerative colitis Maternal Aunt     Past Medical History:  Diagnosis Date  . Anxiety   . Arthritis   . Bipolar affective ([place])   . Chronic headaches   . Crohn's disease ([place]) history of  . Depression   . GERD (gastroesophageal reflux disease)   . Hypertension   . Schizophrenic disorder [facility])     Past Surgical History:  Procedure Laterality Date  . NO PAST SURGERIES      Social History:  reports that he quit smoking about 2 years ago. He has never used smokeless tobacco. He reports that he does not drink alcohol or use drugs.  Allergies: No Known Allergies   (Not in a hospital admission)  Prior to Admission medications   Medication Sig Start Date End Date Taking? Authorizing Provider  Melatonin 2.5 MG CAPS Take 2.5 mg by mouth at bedtime as needed (sleep).    Yes [provider]  dicyclomine (BENTYL) 10 MG capsule Take 1 capsule (10 mg total) by mouth every 6 (six)  hours as needed for spasms (cramping). Patient not taking: Reported on 10/15/2016 10/23/14   Rai, Vernelle Emerald, MD  HYDROcodone-acetaminophen (NORCO/VICODIN) 5-325 MG tablet Take 1-2 tablets by mouth every 6 (six) hours as needed for severe pain. Patient not taking: Reported on 10/15/2016 05/08/15   Blanchie Dessert, MD  omeprazole (PRILOSEC) 40 MG capsule Take 1 capsule (40 mg total) by mouth daily. Patient not taking: Reported on 10/15/2016 02/14/15   Hvozdovic, Lori P, PA-C  predniSONE (DELTASONE) 10 MG tablet Take 3 tablets  daily Patient not taking: Reported on 10/15/2016 12/19/14   Zehr, Janett Billow D, PA-C  predniSONE (DELTASONE) 10 MG tablet TAKE 3 TABLETS (30 MG TOTAL) BY MOUTH DAILY WITH BREAKFAST. Patient not taking: Reported on 10/15/2016 01/25/15   Ladene Artist, MD  traMADol (ULTRAM) 50 MG tablet Take 1 tablet (50 mg total) by mouth every 6 (six) hours as needed. Patient not taking: Reported on 10/15/2016 01/16/15   Ladene Artist, MD    Blood pressure 116/79, pulse 65, temperature 98.7 F (37.1 C), temperature source Oral, resp. rate 16, height 5' 5"  (1.651 m), weight 177 lb (80.3 kg), SpO2 97 %. Physical Exam: General: pleasant, WD/WN AA male who is laying in bed in NAD HEENT: head is normocephalic, atraumatic.  Sclera are noninjected.  Pupils equal and round.  Ears and nose without any masses or lesions.  Mouth is pink and moist. Dentition fair Heart: regular, rate, and rhythm.  No obvious murmurs, gallops, or rubs noted.  Palpable pedal pulses bilaterally Lungs: CTAB, no wheezes, rhonchi, or rales noted.  Respiratory effort nonlabored Abd: soft, ND, mild TTP in central abdomen, +BS, no masses, hernias, or organomegaly MS: all 4 extremities are symmetrical with no cyanosis, clubbing, or edema. Skin: warm and dry with no masses, lesions, or rashes Psych: A&Ox3 with an appropriate affect. Neuro: cranial nerves grossly intact, extremity CSM intact bilaterally, normal speech  Results for orders placed or performed during the hospital encounter of 10/15/16 (from the past 48 hour(s))  Lipase, blood     Status: None   Collection Time: 10/15/16  4:47 AM  Result Value Ref Range   Lipase 38 11 - 51 U/L  Comprehensive metabolic panel     Status: Abnormal   Collection Time: 10/15/16  4:47 AM  Result Value Ref Range   Sodium 141 135 - 145 mmol/L   Potassium 4.0 3.5 - 5.1 mmol/L   Chloride 110 101 - 111 mmol/L   CO2 24 22 - 32 mmol/L   Glucose, Bld 113 (H) 65 - 99 mg/dL   BUN 6 6 - 20 mg/dL   Creatinine,  Ser 0.92 0.61 - 1.24 mg/dL   Calcium 8.8 (L) 8.9 - 10.3 mg/dL   Total Protein 7.0 6.5 - 8.1 g/dL   Albumin 3.7 3.5 - 5.0 g/dL   AST 22 15 - 41 U/L   ALT 26 17 - 63 U/L   Alkaline Phosphatase 96 38 - 126 U/L   Total Bilirubin 0.3 0.3 - 1.2 mg/dL   GFR calc non Af Amer >60 >60 mL/min   GFR calc Af Amer >60 >60 mL/min    Comment: (NOTE) The eGFR has been calculated using the CKD EPI equation. This calculation has not been validated in all clinical situations. eGFR's persistently <60 mL/min signify possible Chronic Kidney Disease.    Anion gap 7 5 - 15  CBC     Status: Abnormal   Collection Time: 10/15/16  4:47 AM  Result Value Ref Range  WBC 6.8 4.0 - 10.5 K/uL   RBC 5.38 4.22 - 5.81 MIL/uL   Hemoglobin 17.2 (H) 13.0 - 17.0 g/dL   HCT 47.1 39.0 - 52.0 %   MCV 87.5 78.0 - 100.0 fL   MCH 32.0 26.0 - 34.0 pg   MCHC 36.5 (H) 30.0 - 36.0 g/dL   RDW 13.4 11.5 - 15.5 %   Platelets 246 150 - 400 K/uL  Urinalysis, Routine w reflex microscopic     Status: None   Collection Time: 10/15/16  4:47 AM  Result Value Ref Range   Color, Urine YELLOW YELLOW   APPearance CLEAR CLEAR   Specific Gravity, Urine 1.013 1.005 - 1.030   pH 5.0 5.0 - 8.0   Glucose, UA NEGATIVE NEGATIVE mg/dL   Hgb urine dipstick NEGATIVE NEGATIVE   Bilirubin Urine NEGATIVE NEGATIVE   Ketones, ur NEGATIVE NEGATIVE mg/dL   Protein, ur NEGATIVE NEGATIVE mg/dL   Nitrite NEGATIVE NEGATIVE   Leukocytes, UA NEGATIVE NEGATIVE   Ct Abdomen Pelvis W Contrast  Result Date: 10/15/2016 CLINICAL DATA:  History of Crohn's disease, chronic. Lower abdominal pain and nausea EXAM: CT ABDOMEN AND PELVIS WITH CONTRAST TECHNIQUE: Multidetector CT imaging of the abdomen and pelvis was performed using the standard protocol following bolus administration of intravenous contrast. CONTRAST:  75 mL  ISOVUE-300 IOPAMIDOL (ISOVUE-300) INJECTION 61% COMPARISON:  December 18, 2014 FINDINGS: Lower chest:  The lung bases are clear. Hepatobiliary: No  liver lesions are evident on this current study. Gallbladder wall is not appreciably thickened. There is no biliary duct dilatation. Pancreas: There is no pancreatic mass or inflammatory focus. Spleen: No splenic lesions are evident. Spleen is normal in size and contour. Adrenals/Urinary Tract: Adrenals appear normal bilaterally. Kidneys bilaterally show no evident mass or hydronephrosis on either side. There is no renal or ureteral calculus on either side. Urinary bladder is midline with wall thickness within normal limits. Stomach/Bowel: There is wall thickening in several areas of mid and distal ileum and cecum consistent with known Crohn's disease. 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 This finding is best evident on axial slice 50 series 3, sagittal slice 43 series 7, and coronal slice 38 series 6. There is no evidence of bowel obstruction or abscess. Bowel loops elsewhere do not show significant thickening. Vascular/Lymphatic: There is no abdominal aortic aneurysm. No vascular lesions are evident. No adenopathy is appreciable in the abdomen or pelvis. Reproductive: Prostate and seminal vesicles appear normal in size and contour. There is no evident pelvic mass. Other: No abscess or ascites is evident in the abdomen or pelvis. The appendix is not appreciable. There is no mesenteric thickening in the region of the appendix. Musculoskeletal: No demonstrable sacroiliitis. No blastic or lytic bone lesions. No intramuscular or abdominal wall lesion evident. IMPRESSION: 1. 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 There is no new bowel thickening in this area. No abscess or evidence of perforation. No more proximal bowel wall thickening. No bowel obstruction seen on this study. No  abscess elsewhere in the abdomen or pelvis. No ascites. 2. No renal or ureteral calculus. No hydronephrosis. No biliary duct dilatation or gallbladder wall thickening. 3.  No demonstrable sacroiliitis. Electronically Signed   By: Lowella Grip III M.D.   On: 10/15/2016 11:47      Assessment/Plan Crohn's  flare - diagnosed at age 72, last flare up requiring prednisone 2016 - previously followed by GI in [place], recently moved back to [facility] - has seen Dr. Fuller Plan here in [place] and Dr. Truman Hayward in [facility] in the past, plans to reestablish care with Dr. 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 Fistula was present on prior CT - WBC 6.8  Plan - Would not recommend surgical intervention on this patient. Recommend GI consult for assistance with treatment of Crohn's flare up. Once this improves he may follow up with colorectal surgeon as outpatient.   [place], [facility] 10/15/2016, 3:21 PM Pager: [phone] Consults: [phone] Mon-Fri 7:00 am-4:30 pm Sat-Sun 7:00 am-11:30 am

## 2016-10-15 NOTE — ED Notes (Signed)
Pt not in room at this time. Family states he was taken to CT. Meds to be given upon return to room.

## 2016-10-15 NOTE — ED Notes (Signed)
Patient transported to CT 

## 2016-10-15 NOTE — ED Triage Notes (Signed)
Patients reports Crohn's flare up this week with generalized abdominal pain " burning/aching" , nausea , emesis and diarrhea , denies fever or chills . He has not taken his medications for Crohn's Disease for 2 weeks .

## 2016-10-16 LAB — COMPREHENSIVE METABOLIC PANEL
ALK PHOS: 96 U/L (ref 38–126)
ALT: 30 U/L (ref 17–63)
ANION GAP: 10 (ref 5–15)
AST: 31 U/L (ref 15–41)
Albumin: 3.9 g/dL (ref 3.5–5.0)
BILIRUBIN TOTAL: 0.6 mg/dL (ref 0.3–1.2)
BUN: 8 mg/dL (ref 6–20)
CALCIUM: 9.1 mg/dL (ref 8.9–10.3)
CO2: 23 mmol/L (ref 22–32)
Chloride: 105 mmol/L (ref 101–111)
Creatinine, Ser: 0.95 mg/dL (ref 0.61–1.24)
GFR calc non Af Amer: >60 mL/min (ref >60)
GLUCOSE: 111 mg/dL — AB (ref 65–99)
Potassium: 4.3 mmol/L (ref 3.5–5.1)
Sodium: 138 mmol/L (ref 135–145)
TOTAL PROTEIN: 7 g/dL (ref 6.5–8.1)

## 2016-10-16 LAB — CBC
HCT: 46.6 % (ref 39.0–52.0)
HEMOGLOBIN: 16.8 g/dL (ref 13.0–17.0)
MCH: 31.5 pg (ref 26.0–34.0)
MCHC: 36.1 g/dL — AB (ref 30.0–36.0)
MCV: 87.4 fL (ref 78.0–100.0)
Platelets: 258 10*3/uL (ref 150–400)
RBC: 5.33 MIL/uL (ref 4.22–5.81)
RDW: 13.5 % (ref 11.5–15.5)
WBC: 13.5 10*3/uL — ABNORMAL HIGH (ref 4.0–10.5)

## 2016-10-16 LAB — HIV ANTIBODY (ROUTINE TESTING W REFLEX): HIV SCREEN 4TH GENERATION: NONREACTIVE

## 2016-10-16 MED ORDER — PANTOPRAZOLE SODIUM 40 MG PO TBEC
40.0000 mg | DELAYED_RELEASE_TABLET | Freq: Every day | ORAL | Status: DC
  Administered 2016-10-16 – 2016-10-17 (×2): 40 mg via ORAL
  Filled 2016-10-16 (×2): qty 1

## 2016-10-16 NOTE — Progress Notes (Signed)
Patient arriving on unit at about 2100 alert and oriented and ambulatory.

## 2016-10-16 NOTE — Consult Note (Signed)
Referring Provider: Dr. Verlon Au Primary Care Physician:  Golden Circle, FNP Primary Gastroenterologist:  Althia Forts  Reason for Consultation:  Diarrhea; Abdominal pain; Crohn's Disease  HPI: Jeffery Mcintosh is a 36 y.o. male with Crohn's disease previously seen by Dr. Fuller Plan in 2016 (dismissed from [facility]) who had a colonoscopy in 2016 that showed "quiescent colitis and a precancerous polyp." He was on Remicade for what sounds like the induction phase and stopped it for unclear reasons. He reports moving to [place] and seeing a GI doctor there and was placed on Humira, which he took for several months this year and reports his last injection was 3 weeks ago. Reports having a lot of "gurgling" while on the Humira that was bothersome to him. He has been having frequent diarrhea that started this past Saturday with lower abdominal pain. Saw occasional blood with wiping. Would have the diarrhea about 5 times per day and has reports 3 loose stools thus far this morning. Denies F/N/V. Has been eating ok prior to admit. Denies previous surgery for his Crohn's. CT scan on admit shows chronic wall thickening in portions of the mid to distal ileum and cecum with a distal enteroenteric fistula near the ICV. Bowel findings and fistula similar to 2016 CT. Solumedrol started on admit. No abscess or perforation seen.   Past Medical History:  Diagnosis Date  . Anxiety   . Arthritis   . Bipolar affective ([place])   . Chronic headaches   . Crohn's disease ([place]) history of  . Depression   . GERD (gastroesophageal reflux disease)   . Hypertension   . Schizophrenic disorder [facility])     Past Surgical History:  Procedure Laterality Date  . NO PAST SURGERIES      Prior to Admission medications   Medication Sig Start Date End Date Taking? Authorizing Provider  Melatonin 2.5 MG CAPS Take 2.5 mg by mouth at bedtime as needed (sleep).    Yes [provider]  dicyclomine (BENTYL) 10 MG capsule Take 1  capsule (10 mg total) by mouth every 6 (six) hours as needed for spasms (cramping). Patient not taking: Reported on 10/15/2016 10/23/14   Rai, Vernelle Emerald, MD  HYDROcodone-acetaminophen (NORCO/VICODIN) 5-325 MG tablet Take 1-2 tablets by mouth every 6 (six) hours as needed for severe pain. Patient not taking: Reported on 10/15/2016 05/08/15   Blanchie Dessert, MD  omeprazole (PRILOSEC) 40 MG capsule Take 1 capsule (40 mg total) by mouth daily. Patient not taking: Reported on 10/15/2016 02/14/15   Hvozdovic, Lori P, PA-C  predniSONE (DELTASONE) 10 MG tablet Take 3 tablets daily Patient not taking: Reported on 10/15/2016 12/19/14   Zehr, Janett Billow D, PA-C  predniSONE (DELTASONE) 10 MG tablet TAKE 3 TABLETS (30 MG TOTAL) BY MOUTH DAILY WITH BREAKFAST. Patient not taking: Reported on 10/15/2016 01/25/15   Ladene Artist, MD  traMADol (ULTRAM) 50 MG tablet Take 1 tablet (50 mg total) by mouth every 6 (six) hours as needed. Patient not taking: Reported on 10/15/2016 01/16/15   Ladene Artist, MD    Scheduled Meds: . methylPREDNISolone (SOLU-MEDROL) injection  60 mg Intravenous Q12H  .  morphine injection  4 mg Intravenous Once  . sodium chloride flush  3 mL Intravenous Q12H   Continuous Infusions: . lactated ringers 125 mL/hr at 10/16/16 0832  . lactated ringers 125 mL/hr at 10/15/16 2147   PRN Meds:.HYDROmorphone (DILAUDID) injection, ondansetron (ZOFRAN) IV  Allergies as of 10/15/2016  . (No Known Allergies)    Family History  Problem  Relation Age of Onset  . Hypertension Mother   . Heart attack Mother   . Stroke Mother   . Alcohol abuse Father   . Mental illness Father   . Multiple sclerosis Father   . Arthritis Maternal Grandmother   . Hypertension Maternal Grandmother   . Heart disease Maternal Grandmother   . Kidney disease Maternal Grandmother   . Diabetes Maternal Grandmother   . Cancer Maternal Grandmother        great, type unknown  . Hyperlipidemia Maternal Grandfather   .  Colon polyps Maternal Grandfather   . Stroke Paternal Grandmother   . Ulcerative colitis Maternal Aunt     Social History   Social History  . Marital status: Single    Spouse name: N/A  . Number of children: 2  . Years of education: 12   Occupational History  . disabled     Crohn's / Bipolar / Schizophrenia   Social History Main Topics  . Smoking status: Former Smoker    Quit date: 10/16/2014  . Smokeless tobacco: Never Used  . Alcohol use No  . Drug use: No  . Sexual activity: No   Other Topics Concern  . Not on file   Social History Narrative  . No narrative on file    Review of Systems: All negative except as stated above in HPI.  Physical Exam: Vital signs: Vitals:   10/16/16 0158 10/16/16 0635  BP: 133/71 (!) 112/95  Pulse: 72 65  Resp: 18 18  Temp: 98.8 F (37.1 C) 98.1 F (36.7 C)  SpO2: 98% 98%     General:   Lethargic, Well-developed, well-nourished, pleasant and cooperative in NAD, multiple tattoos Head: normocephalic, atraumatic Eyes: anicteric sclera ENT: oropharynx clear Neck: supple, nontender Lungs:  Clear throughout to auscultation.   No wheezes, crackles, or rhonchi. No acute distress. Heart:  Regular rate and rhythm; no murmurs, clicks, rubs,  or gallops. Abdomen: RLQ tenderness with guarding, LLQ tenderness with minimal guarding, soft, nondistended, +BS  Rectal:  Deferred Ext: no edema  GI:  Lab Results:  Recent Labs  10/15/16 0447 10/16/16 0531  WBC 6.8 13.5*  HGB 17.2* 16.8  HCT 47.1 46.6  PLT 246 258   BMET  Recent Labs  10/15/16 0447 10/16/16 0531  NA 141 138  K 4.0 4.3  CL 110 105  CO2 24 23  GLUCOSE 113* 111*  BUN 6 8  CREATININE 0.92 0.95  CALCIUM 8.8* 9.1   LFT  Recent Labs  10/16/16 0531  PROT 7.0  ALBUMIN 3.9  AST 31  ALT 30  ALKPHOS 96  BILITOT 0.6   PT/INR No results for input(s): LABPROT, INR in the last 72 hours.   Studies/Results: Ct Abdomen Pelvis W Contrast  Result Date:  10/15/2016 CLINICAL DATA:  History of Crohn's disease, chronic. Lower abdominal pain and nausea EXAM: CT ABDOMEN AND PELVIS WITH CONTRAST TECHNIQUE: Multidetector CT imaging of the abdomen and pelvis was performed using the standard protocol following bolus administration of intravenous contrast. CONTRAST:  75 mL  ISOVUE-300 IOPAMIDOL (ISOVUE-300) INJECTION 61% COMPARISON:  December 18, 2014 FINDINGS: Lower chest:  The lung bases are clear. Hepatobiliary: No liver lesions are evident on this current study. Gallbladder wall is not appreciably thickened. There is no biliary duct dilatation. Pancreas: There is no pancreatic mass or inflammatory focus. Spleen: No splenic lesions are evident. Spleen is normal in size and contour. Adrenals/Urinary Tract: Adrenals appear normal bilaterally. Kidneys bilaterally show no evident mass or hydronephrosis  on either side. There is no renal or ureteral calculus on either side. Urinary bladder is midline with wall thickness within normal limits. Stomach/Bowel: There is wall thickening in several areas of mid and distal ileum and cecum consistent with known Crohn's disease. 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 This finding is best evident on axial slice 50 series 3, sagittal slice 43 series 7, and coronal slice 38 series 6. There is no evidence of bowel obstruction or abscess. Bowel loops elsewhere do not show significant thickening. Vascular/Lymphatic: There is no abdominal aortic aneurysm. No vascular lesions are evident. No adenopathy is appreciable in the abdomen or pelvis. Reproductive: Prostate and seminal vesicles appear normal in size and contour. There is no evident pelvic mass. Other: No abscess or ascites is evident in the abdomen or pelvis. The appendix is not appreciable. There is no mesenteric thickening in the region of the appendix.  Musculoskeletal: No demonstrable sacroiliitis. No blastic or lytic bone lesions. No intramuscular or abdominal wall lesion evident. IMPRESSION: 1. 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 There is no new bowel thickening in this area. No abscess or evidence of perforation. No more proximal bowel wall thickening. No bowel obstruction seen on this study. No abscess elsewhere in the abdomen or pelvis. No ascites. 2. No renal or ureteral calculus. No hydronephrosis. No biliary duct dilatation or gallbladder wall thickening. 3.  No demonstrable sacroiliitis. Electronically Signed   By: Lowella Grip III M.D.   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 Previous use of Remicade and unable to say whether his symptoms have changed on Humira compared to Remicade. Reports tolerating the Remicade infusions but did not like the time it took for an infusion. Agree with IV steroids. Clear liquid diet ok. Would continue Humira as outpt but may need to change to another biologic if the fistula does not resolve. Also, may need to see an IBD surgeon as an outpt if the fistula persists. Continue IVFs. Continue supportive care. Will follow.    LOS: 1 day   Warren C.  10/16/2016, 9:30 AM  Pager [phone]  AFTER 5 pm or on weekends please call [phone]

## 2016-10-16 NOTE — Progress Notes (Signed)
Jeffery Mcintosh  PROGRESS NOTE    Jeffery Mcintosh  GLO:[id number] DOB: August 03, 1980 DOA: 10/15/2016 PCP: [facility], FNP   Specialists:     Brief Narrative:   36 year old African-American male Admitted 10/15/16 with Crohn's colitis flare in the setting of noncompliance and recent from [place] GI consult    Assessment & Plan:   Active Problems:   Crohn's colitis, unspecified complication ([place])   Crohn's flare  Continue IV Solu-Medrol60 every 12 for now continue IV saline and have soft diet as per patient request--cautioned patient to go slow eating  Appreciate GI input  we'll monitor for resolution of symptoms--?transition in 24-48 hr to po steorids  Reflux  Switch over to by mouth Protonix later on todayif tolerating diet her  Bipolar  Outpatient management and follow-up with her primary care physician or psychiatrist  Body mass index is 29.45 kg/m.  Considered as obese and would need outpatient management and counselling   DVT prophylaxis: scd Code Status: full Family Communication: none Disposition Plan: inpatient--needs 24 more hours likely   Consultants:   gi  Procedures:   None yet  Antimicrobials:   none    Subjective:  awake alert better pain controlled No new issue  Objective: Vitals:   10/15/16 2000 10/15/16 2215 10/16/16 0158 10/16/16 0635  BP: 116/68 (!) 158/71 133/71 (!) 112/95  Pulse: 68 70 72 65  Resp:  19 18 18   Temp:  98.8 F (37.1 C) 98.8 F (37.1 C) 98.1 F (36.7 C)  TempSrc:  Oral Oral Oral  SpO2: 94% 98% 98% 98%  Weight:      Height:        Intake/Output Summary (Last 24 hours) at 10/16/16 1120 Last data filed at 10/15/16 1534  Gross per 24 hour  Intake             1000 ml  Output                0 ml  Net             1000 ml   Filed Weights   10/15/16 0442  Weight: 80.3 kg (177 lb)    Examination:  Awake alert in nad eomim ncat cta b abd soft nt nd no rebound no guard No le edema  Data Reviewed: I have  personally reviewed following labs and imaging studies  CBC:  Recent Labs Lab 10/15/16 0447 10/16/16 0531  WBC 6.8 13.5*  HGB 17.2* 16.8  HCT 47.1 46.6  MCV 87.5 87.4  PLT 246 188   Basic Metabolic Panel:  Recent Labs Lab 10/15/16 0447 10/16/16 0531  NA 141 138  K 4.0 4.3  CL 110 105  CO2 24 23  GLUCOSE 113* 111*  BUN 6 8  CREATININE 0.92 0.95  CALCIUM 8.8* 9.1   GFR: Estimated Creatinine Clearance: 106.9 mL/min (by C-G formula based on SCr of 0.95 mg/dL). Liver Function Tests:  Recent Labs Lab 10/15/16 0447 10/16/16 0531  AST 22 31  ALT 26 30  ALKPHOS 96 96  BILITOT 0.3 0.6  PROT 7.0 7.0  ALBUMIN 3.7 3.9    Recent Labs Lab 10/15/16 0447  LIPASE 38   No results for input(s): AMMONIA in the last 168 hours. Coagulation Profile: No results for input(s): INR, PROTIME in the last 168 hours. Cardiac Enzymes: No results for input(s): CKTOTAL, CKMB, CKMBINDEX, TROPONINI in the last 168 hours. BNP (last 3 results) No results for input(s): PROBNP in the last 8760 hours. HbA1C: No results for  input(s): HGBA1C in the last 72 hours. CBG: No results for input(s): GLUCAP in the last 168 hours. Lipid Profile: No results for input(s): CHOL, HDL, LDLCALC, TRIG, CHOLHDL, LDLDIRECT in the last 72 hours. Thyroid Function Tests: No results for input(s): TSH, T4TOTAL, FREET4, T3FREE, THYROIDAB in the last 72 hours. Anemia Panel: No results for input(s): VITAMINB12, FOLATE, FERRITIN, TIBC, IRON, RETICCTPCT in the last 72 hours. Urine analysis:    Component Value Date/Time   COLORURINE YELLOW 10/15/2016 0447   APPEARANCEUR CLEAR 10/15/2016 0447   LABSPEC 1.013 10/15/2016 0447   PHURINE 5.0 10/15/2016 0447   GLUCOSEU NEGATIVE 10/15/2016 0447   GLUCOSEU NEGATIVE 04/18/2013 1418   HGBUR NEGATIVE 10/15/2016 0447   BILIRUBINUR NEGATIVE 10/15/2016 0447   KETONESUR NEGATIVE 10/15/2016 0447   PROTEINUR NEGATIVE 10/15/2016 0447   UROBILINOGEN 1.0 11/16/2014 1719    NITRITE NEGATIVE 10/15/2016 0447   LEUKOCYTESUR NEGATIVE 10/15/2016 0447     Radiology Studies: Reviewed images personally in health database    Scheduled Meds: . methylPREDNISolone (SOLU-MEDROL) injection  60 mg Intravenous Q12H  .  morphine injection  4 mg Intravenous Once  . sodium chloride flush  3 mL Intravenous Q12H   Continuous Infusions: . lactated ringers 125 mL/hr at 10/16/16 0832  . lactated ringers 125 mL/hr at 10/15/16 2147     LOS: 1 day    Time spent: [place], MD [facility]   If 7PM-7AM, please contact night-coverage [email or web address] Password [email or web address] 10/16/2016, 11:20 AM

## 2016-10-16 NOTE — Progress Notes (Signed)
Subjective No acute events. Feeling reasonably well, pain better. Denies n/v. Had some diarrhea overnight. No other complaints  Objective: Vital signs in last 24 hours: Temp:  [98.1 F (36.7 C)-98.8 F (37.1 C)] 98.1 F (36.7 C) (09/13 0635) Pulse Rate:  [52-74] 65 (09/13 0635) Resp:  [16-19] 18 (09/13 0635) BP: (105-158)/(32-111) 112/95 (09/13 0635) SpO2:  [94 %-100 %] 98 % (09/13 0635)    Intake/Output from previous day: 09/12 0701 - 09/13 0700 In: 1000 [IV Piggyback:1000] Out: -  Intake/Output this shift: No intake/output data recorded.  Gen: NAD, comfortable CV: RRR Pulm: Normal work of breathing Abd: Soft, NT/ND Ext: SCDs in place  Lab Results: CBC   Recent Labs  10/15/16 0447 10/16/16 0531  WBC 6.8 13.5*  HGB 17.2* 16.8  HCT 47.1 46.6  PLT 246 258   BMET  Recent Labs  10/15/16 0447 10/16/16 0531  NA 141 138  K 4.0 4.3  CL 110 105  CO2 24 23  GLUCOSE 113* 111*  BUN 6 8  CREATININE 0.92 0.95  CALCIUM 8.8* 9.1   PT/INR No results for input(s): LABPROT, INR in the last 72 hours. ABG No results for input(s): PHART, HCO3 in the last 72 hours.  Invalid input(s): PCO2, PO2  Studies/Results:  Anti-infectives: Anti-infectives    None       Assessment/Plan: Patient Active Problem List   Diagnosis Date Noted  . Crohn's colitis, unspecified complication ([place]) 32/76/1470  . Epigastric burning sensation 01/03/2015  . Numbness and tingling in left arm 11/15/2014  . Exacerbation of Crohn's disease ([place]) 10/21/2014  . Crohn's disease ([place]) 10/21/2014  . Diarrhea 10/21/2014  . Abdominal pain   . Gout 04/18/2013  . Cubital tunnel syndrome 04/18/2013  . 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  -No planned surgical intervention at this time - would recommend continuing with medical management of his disease -Diet as tolerated from our standpoint but will defer to GI   LOS: 1 day   Ileana Roup, MD [facility]

## 2016-10-17 ENCOUNTER — Encounter: Payer: Self-pay | Admitting: Family Medicine

## 2016-10-17 ENCOUNTER — Encounter (HOSPITAL_COMMUNITY): Payer: Self-pay

## 2016-10-17 MED ORDER — ALUM & MAG HYDROXIDE-SIMETH 200-200-20 MG/5ML PO SUSP
30.0000 mL | Freq: Four times a day (QID) | ORAL | Status: DC | PRN
  Administered 2016-10-17 (×2): 30 mL via ORAL
  Filled 2016-10-17 (×2): qty 30

## 2016-10-17 MED ORDER — OMEPRAZOLE 40 MG PO CPDR: 40.0000 mg | DELAYED_RELEASE_CAPSULE | Freq: Every day | ORAL | 3 refills | Status: DC

## 2016-10-17 MED ORDER — PREDNISONE 50 MG PO TABS
60.0000 mg | ORAL_TABLET | Freq: Once | ORAL | Status: AC
  Administered 2016-10-17: 60 mg via ORAL
  Filled 2016-10-17: qty 1

## 2016-10-17 MED ORDER — PREDNISONE 10 MG PO TABS: ORAL_TABLET | ORAL | 0 refills | Status: DC

## 2016-10-17 NOTE — Progress Notes (Signed)
Pt discharged home in stable condition after going over discharge instructions with no concerns voiced. AVS given to pt before leaving unit

## 2016-10-17 NOTE — Discharge Summary (Addendum)
,  Physician Discharge Summary  Jeffery Mcintosh WUX:[id number] DOB: [birth year] DOA: 10/15/2016  PCP: [facility], FNP  Admit date: 10/15/2016 Discharge date: 10/17/2016  Time spent: 25 minutes  Recommendations for Outpatient Follow-up:  1. Cont steroid pred 60 till sees Dr. Michail Sermon as OP 2. Will need OP consideration for rpt Ct vs surgical management of chr Entero-enteric fistula  Discharge Diagnoses:  Active Problems:   Crohn's colitis, unspecified complication ([place])   Discharge Condition: good  Diet recommendation: hh  Filed Weights   10/15/16 0442  Weight: 80.3 kg (177 lb)    History of present illness:  36 year old African-American male Admitted 10/15/16 with Crohn's colitis flare in the setting of noncompliance and recent move back to [place] from [place] consulted  Hospital Course:  Crohn's flare             IV Solu-Medrol60 every 12 for now continue IV--->prednisone 60 on d/c             Appreciate GI input             we'll monitor for resolution of symptoms--will need OP f/u  Reflux             Switch over to by mouth Protonix later--given rx for prilosec on d/cher  Bipolar             Outpatient management and follow-up with her primary care physician or psychiatrist  Body mass index is 29.45 kg/m.             Considered as obese and would need outpatient management and counselling   Consultations:  GI schooler  Discharge Exam: Vitals:   10/16/16 2223 10/17/16 0509  BP: (!) 146/73 137/76  Pulse: 76 66  Resp: 19 19  Temp: 98.3 F (36.8 C) 98.2 F (36.8 C)  SpO2: 100% 97%   Some discomfort in belly, no rebound Some heartburn--cpl loose stools now improved General: eomi ncat no pallor Cardiovascular: s1 s 2no m/r/g Respiratory: clear abd soft nt nd  Discharge Instructions   Discharge Instructions    Diet - low sodium heart healthy    Complete by:  As directed    Discharge instructions    Complete by:  As directed    Cont  omeprazole Keep taking the prednisone till you see Dr. Michail Sermon   Increase activity slowly    Complete by:  As directed      Current Discharge Medication List    CONTINUE these medications which have CHANGED   Details  omeprazole (PRILOSEC) 40 MG capsule Take 1 capsule (40 mg total) by mouth daily. Qty: 30 capsule, Refills: 3    predniSONE (DELTASONE) 10 MG tablet TAKE 3 TABLETS (30 MG TOTAL) BY MOUTH DAILY WITH BREAKFAST. Qty: 90 tablet, Refills: 0      CONTINUE these medications which have NOT CHANGED   Details  Melatonin 2.5 MG CAPS Take 2.5 mg by mouth at bedtime as needed (sleep).     dicyclomine (BENTYL) 10 MG capsule Take 1 capsule (10 mg total) by mouth every 6 (six) hours as needed for spasms (cramping). Qty: 90 capsule, Refills: 1    traMADol (ULTRAM) 50 MG tablet Take 1 tablet (50 mg total) by mouth every 6 (six) hours as needed. Qty: 30 tablet, Refills: 0      STOP taking these medications     HYDROcodone-acetaminophen (NORCO/VICODIN) 5-325 MG tablet        No Known Allergies    The results of  significant diagnostics from this hospitalization (including imaging, microbiology, ancillary and laboratory) are listed below for reference.    Significant Diagnostic Studies: Ct Abdomen Pelvis W Contrast  Result Date: 10/15/2016 CLINICAL DATA:  History of Crohn's disease, chronic. Lower abdominal pain and nausea EXAM: CT ABDOMEN AND PELVIS WITH CONTRAST TECHNIQUE: Multidetector CT imaging of the abdomen and pelvis was performed using the standard protocol following bolus administration of intravenous contrast. CONTRAST:  75 mL  ISOVUE-300 IOPAMIDOL (ISOVUE-300) INJECTION 61% COMPARISON:  December 18, 2014 FINDINGS: Lower chest:  The lung bases are clear. Hepatobiliary: No liver lesions are evident on this current study. Gallbladder wall is not appreciably thickened. There is no biliary duct dilatation. Pancreas: There is no pancreatic mass or inflammatory focus. Spleen:  No splenic lesions are evident. Spleen is normal in size and contour. Adrenals/Urinary Tract: Adrenals appear normal bilaterally. Kidneys bilaterally show no evident mass or hydronephrosis on either side. There is no renal or ureteral calculus on either side. Urinary bladder is midline with wall thickness within normal limits. Stomach/Bowel: There is wall thickening in several areas of mid and distal ileum and cecum consistent with known Crohn's disease. 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 This finding is best evident on axial slice 50 series 3, sagittal slice 43 series 7, and coronal slice 38 series 6. There is no evidence of bowel obstruction or abscess. Bowel loops elsewhere do not show significant thickening. Vascular/Lymphatic: There is no abdominal aortic aneurysm. No vascular lesions are evident. No adenopathy is appreciable in the abdomen or pelvis. Reproductive: Prostate and seminal vesicles appear normal in size and contour. There is no evident pelvic mass. Other: No abscess or ascites is evident in the abdomen or pelvis. The appendix is not appreciable. There is no mesenteric thickening in the region of the appendix. Musculoskeletal: No demonstrable sacroiliitis. No blastic or lytic bone lesions. No intramuscular or abdominal wall lesion evident. IMPRESSION: 1. 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 There is no new bowel thickening in this area. No abscess or evidence of perforation. No more proximal bowel wall thickening. No bowel obstruction seen on this study. No abscess elsewhere in the abdomen or pelvis. No ascites. 2. No renal or ureteral calculus. No hydronephrosis. No biliary duct dilatation or gallbladder wall thickening. 3.  No demonstrable sacroiliitis.  Electronically Signed   By: Lowella Grip III M.D.   On: 10/15/2016 11:47    Microbiology: No results found for this or any previous visit (from the past 240 hour(s)).   Labs: Basic Metabolic Panel:  Recent Labs Lab 10/15/16 0447 10/16/16 0531  NA 141 138  K 4.0 4.3  CL 110 105  CO2 24 23  GLUCOSE 113* 111*  BUN 6 8  CREATININE 0.92 0.95  CALCIUM 8.8* 9.1   Liver Function Tests:  Recent Labs Lab 10/15/16 0447 10/16/16 0531  AST 22 31  ALT 26 30  ALKPHOS 96 96  BILITOT 0.3 0.6  PROT 7.0 7.0  ALBUMIN 3.7 3.9    Recent Labs Lab 10/15/16 0447  LIPASE 38   No results for input(s): AMMONIA in the last 168 hours. CBC:  Recent Labs Lab 10/15/16 0447 10/16/16 0531  WBC 6.8 13.5*  HGB 17.2* 16.8  HCT 47.1 46.6  MCV 87.5 87.4  PLT 246 258   Cardiac  Enzymes: No results for input(s): CKTOTAL, CKMB, CKMBINDEX, TROPONINI in the last 168 hours. BNP: BNP (last 3 results) No results for input(s): BNP in the last 8760 hours.  ProBNP (last 3 results) No results for input(s): PROBNP in the last 8760 hours.  CBG: No results for input(s): GLUCAP in the last 168 hours.     SignedNita Sells MD   [facility] 10/17/2016, 12:08 PM

## 2016-10-17 NOTE — Progress Notes (Signed)
[facility] Gastroenterology Progress Note  Jeffery Mcintosh 36 y.o. 08-Jul-1980   Subjective: Complains of heartburn this morning relieved with Maalox. Denies lower abdominal pain. No BMs today. Reports 3-4 loose stools yesterday. Significant other at bedside.  Objective: Vital signs: Vitals:   10/16/16 2223 10/17/16 0509  BP: (!) 146/73 137/76  Pulse: 76 66  Resp: 19 19  Temp: 98.3 F (36.8 C) 98.2 F (36.8 C)  SpO2: 100% 97%    Physical Exam: Gen: alert, no acute distress  HEENT: anicteric sclera CV: RRR Chest: CTA B Abd: soft, nontender, nondistended, +BS Ext: no edema  Lab Results:  Recent Labs  10/15/16 0447 10/16/16 0531  NA 141 138  K 4.0 4.3  CL 110 105  CO2 24 23  GLUCOSE 113* 111*  BUN 6 8  CREATININE 0.92 0.95  CALCIUM 8.8* 9.1    Recent Labs  10/15/16 0447 10/16/16 0531  AST 22 31  ALT 26 30  ALKPHOS 96 96  BILITOT 0.3 0.6  PROT 7.0 7.0  ALBUMIN 3.7 3.9    Recent Labs  10/15/16 0447 10/16/16 0531  WBC 6.8 13.5*  HGB 17.2* 16.8  HCT 47.1 46.6  MCV 87.5 87.4  PLT 246 258      Assessment/Plan: 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 Changed to PO Prednisone. Tolerating regular diet. Ok to go home later today if tolerates PO Prednisone. Likely needs to be changed to a new biologic since bowel changes are unchanged since 2016. Will check Remicade antibodies as outpt and if able will re-induce with Remicade to try and resolve the fistula and inflammation. If recurrent steroid tapers are needed, then he may need surgery for his Crohn's in the near future. Will rediscuss as an outpt. F/U with me in 3-4 weeks. Remain on Prednisone 60 mg/day until outpt f/u.   Sherrard C. 10/17/2016, 11:39 AM  Pager [phone]  AFTER 5 PM or on weekends please call [phone] ID: Jeffery Mcintosh, male   DOB: 28-Aug-1980, 36 y.o.   MRN: [id number]

## 2016-10-17 NOTE — Care Management Note (Signed)
Case Management Note  Patient Details  Name: Jeffery Mcintosh MRN: [id number] Date of Birth: [birth year]  Subjective/Objective:                    Action/Plan: Pt discharging home with self care. Pt has PCP, insurance and transportation home. No further needs per CM.   Expected Discharge Date:  10/17/16               Expected Discharge Plan:  Home/Self Care  In-House Referral:     Discharge planning Services     Post Acute Care Choice:    Choice offered to:     DME Arranged:    DME Agency:     HH Arranged:    HH Agency:     Status of Service:  Completed, signed off  If discussed at [employer] of Stay Meetings, dates discussed:    Additional Comments:  Pollie Friar, RN 10/17/2016, 1:16 PM

## 2016-10-20 NOTE — ED Provider Notes (Signed)
[place] DEPT Provider Note   CSN: [id number] Arrival date & time: 10/15/16  0432     History   Chief Complaint Chief Complaint  Patient presents with  . Crohn's Disease    Flare UIp    HPI Jeffery Mcintosh is a 36 y.o. male.  HPI Patient presents to the emergency department with abdominal pain that started several days ago, but worse in the last 24 hours.  Patient states that he has had diarrhea with some bleeding.  She has a history of Crohn's disease.  He states that currently taking any medications.  Patient states that he does not see a GI doctor currently and has recently moved back to the area from [place].  Patient states that he did not take any medications prior to arrival.  States nothing seems make the condition better, but palpation makes the pain worseThe patient denies chest pain, shortness of breath, headache,blurred vision, neck pain, fever, cough, weakness, numbness, dizziness, anorexia, edema,rash, back pain, dysuria, hematemesis,near syncope, or syncope. Past Medical History:  Diagnosis Date  . Anxiety   . Arthritis   . Bipolar affective ([place])   . Chronic headaches   . Crohn's disease ([place]) history of  . Depression   . GERD (gastroesophageal reflux disease)   . Hypertension   . Schizophrenic disorder [facility])     Patient Active Problem List   Diagnosis Date Noted  . Crohn's colitis, unspecified complication ([place]) 34/19/3790  . Epigastric burning sensation 01/03/2015  . Numbness and tingling in left arm 11/15/2014  . Exacerbation of Crohn's disease ([place]) 10/21/2014  . Crohn's disease ([place]) 10/21/2014  . Diarrhea 10/21/2014  . Abdominal pain   . Gout 04/18/2013  . Cubital tunnel syndrome 04/18/2013  . Abnormal urinalysis 04/18/2013    Past Surgical History:  Procedure Laterality Date  . NO PAST SURGERIES         Home Medications    Prior to Admission medications   Medication Sig Start Date End Date Taking? Authorizing Provider    Melatonin 2.5 MG CAPS Take 2.5 mg by mouth at bedtime as needed (sleep).    Yes [provider]  dicyclomine (BENTYL) 10 MG capsule Take 1 capsule (10 mg total) by mouth every 6 (six) hours as needed for spasms (cramping). Patient not taking: Reported on 10/15/2016 10/23/14   Rai, Vernelle Emerald, MD  omeprazole (PRILOSEC) 40 MG capsule Take 1 capsule (40 mg total) by mouth daily. 10/17/16   Nita Sells, MD  predniSONE (DELTASONE) 10 MG tablet TAKE 3 TABLETS (30 MG TOTAL) BY MOUTH DAILY WITH BREAKFAST. 10/17/16   Nita Sells, MD  traMADol (ULTRAM) 50 MG tablet Take 1 tablet (50 mg total) by mouth every 6 (six) hours as needed. Patient not taking: Reported on 10/15/2016 01/16/15   Ladene Artist, MD    Family History Family History  Problem Relation Age of Onset  . Hypertension Mother   . Heart attack Mother   . Stroke Mother   . Alcohol abuse Father   . Mental illness Father   . Multiple sclerosis Father   . Arthritis Maternal Grandmother   . Hypertension Maternal Grandmother   . Heart disease Maternal Grandmother   . Kidney disease Maternal Grandmother   . Diabetes Maternal Grandmother   . Cancer Maternal Grandmother        great, type unknown  . Hyperlipidemia Maternal Grandfather   . Colon polyps Maternal Grandfather   . Stroke Paternal Grandmother   . Ulcerative colitis Maternal Aunt  Social History Social History  Substance Use Topics  . Smoking status: Former Smoker    Quit date: 10/16/2014  . Smokeless tobacco: Never Used  . Alcohol use No     Allergies   Patient has no known allergies.   Review of Systems Review of Systems date of Physical Exam Updated Vital Signs BP (!) 143/73 (BP Location: Left Arm)   Pulse 61   Temp 98.3 F (36.8 C) (Oral)   Resp 19   Ht 5' 5"  (1.651 m)   Wt 80.3 kg (177 lb)   SpO2 99%   BMI 29.45 kg/m   Physical Exam  Constitutional: He is oriented to person, place, and time. He appears well-developed and  well-nourished. No distress.  HENT:  Head: Normocephalic and atraumatic.  Mouth/Throat: Oropharynx is clear and moist.  Eyes: Pupils are equal, round, and reactive to light.  Neck: Normal range of motion. Neck supple.  Cardiovascular: Normal rate, regular rhythm and normal heart sounds.  Exam reveals no gallop and no friction rub.   No murmur heard. Pulmonary/Chest: Effort normal and breath sounds normal. No respiratory distress. He has no wheezes.  Abdominal: Soft. Bowel sounds are normal. He exhibits no distension and no mass. There is tenderness. There is no rebound and no guarding.    Neurological: He is alert and oriented to person, place, and time. He exhibits normal muscle tone. Coordination normal.  Skin: Skin is warm and dry. Capillary refill takes less than 2 seconds. No rash noted. No erythema.  Psychiatric: He has a normal mood and affect. His behavior is normal.  Nursing note and vitals reviewed.    ED Treatments / Results  Labs (all labs ordered are listed, but only abnormal results are displayed) Labs Reviewed  COMPREHENSIVE METABOLIC PANEL - Abnormal; Notable for the following:       Result Value   Glucose, Bld 113 (*)    Calcium 8.8 (*)    All other components within normal limits  CBC - Abnormal; Notable for the following:    Hemoglobin 17.2 (*)    MCHC 36.5 (*)    All other components within normal limits  COMPREHENSIVE METABOLIC PANEL - Abnormal; Notable for the following:    Glucose, Bld 111 (*)    All other components within normal limits  CBC - Abnormal; Notable for the following:    WBC 13.5 (*)    MCHC 36.1 (*)    All other components within normal limits  LIPASE, BLOOD  URINALYSIS, ROUTINE W REFLEX MICROSCOPIC  HIV ANTIBODY (ROUTINE TESTING)    EKG  EKG Interpretation None       Radiology No results found.  Procedures Procedures (including critical care time)  Medications Ordered in ED Medications  iopamidol (ISOVUE-300) 61 %  injection (80 mLs Intravenous Contrast Given 10/15/16 1134)  ondansetron (ZOFRAN) injection 4 mg (4 mg Intravenous Given 10/15/16 1138)  HYDROmorphone (DILAUDID) injection 1 mg (1 mg Intravenous Given 10/15/16 1142)  sodium chloride 0.9 % bolus 1,000 mL (0 mLs Intravenous Stopped 10/15/16 1534)  HYDROmorphone (DILAUDID) injection 1 mg (1 mg Intravenous Given 10/15/16 1635)  predniSONE (DELTASONE) tablet 60 mg (60 mg Oral Given 10/17/16 1402)     Initial Impression / Assessment and Plan / ED Course  I have reviewed the triage vital signs and the nursing notes.  Pertinent labs & imaging results that were available during my care of the patient were reviewed by me and considered in my medical decision making (see chart for details).  Patient has lower abdominal pain, persistent, despite pain medications.  I spoke with GI and the [facility] hospital[facility].  We are feels patient will need admission due to his pain and the lack of good follow-up care.  The patient has been stable here in the emergency department.  Patient agrees the plan and all questions were answered  Final Clinical Impressions(s) / ED Diagnoses   Final diagnoses:  Crohn's disease of colon with fistula [facility])    New Prescriptions Discharge Medication List as of 10/17/2016 12:06 PM       Dalia Heading, PA-C 10/20/16 1409    Mesner, Corene Cornea, MD 10/20/16 1538

## 2016-12-31 ENCOUNTER — Emergency Department (HOSPITAL_COMMUNITY)
Admission: EM | Admit: 2016-12-31 | Discharge: 2017-01-01 | Disposition: A | Discharge status: Home / Self Care | Payer: Medicare Other | Attending: Emergency Medicine | Admitting: Emergency Medicine

## 2016-12-31 ENCOUNTER — Encounter (HOSPITAL_COMMUNITY): Payer: Self-pay | Admitting: Emergency Medicine

## 2016-12-31 DIAGNOSIS — F17210 Nicotine dependence, cigarettes, uncomplicated: Secondary | ICD-10-CM | POA: Insufficient documentation

## 2016-12-31 DIAGNOSIS — R1013 Epigastric pain: Secondary | ICD-10-CM | POA: Insufficient documentation

## 2016-12-31 DIAGNOSIS — I10 Essential (primary) hypertension: Secondary | ICD-10-CM | POA: Insufficient documentation

## 2016-12-31 DIAGNOSIS — Z79899 Other long term (current) drug therapy: Secondary | ICD-10-CM | POA: Diagnosis not present

## 2016-12-31 LAB — COMPREHENSIVE METABOLIC PANEL
ALK PHOS: 124 U/L (ref 38–126)
ALT: 36 U/L (ref 17–63)
ANION GAP: 10 (ref 5–15)
AST: 29 U/L (ref 15–41)
Albumin: 4.3 g/dL (ref 3.5–5.0)
BILIRUBIN TOTAL: 0.7 mg/dL (ref 0.3–1.2)
BUN: 10 mg/dL (ref 6–20)
CALCIUM: 9.2 mg/dL (ref 8.9–10.3)
CO2: 25 mmol/L (ref 22–32)
Chloride: 102 mmol/L (ref 101–111)
Creatinine, Ser: 0.96 mg/dL (ref 0.61–1.24)
Glucose, Bld: 139 mg/dL — ABNORMAL HIGH (ref 65–99)
Potassium: 3.9 mmol/L (ref 3.5–5.1)
Sodium: 137 mmol/L (ref 135–145)
TOTAL PROTEIN: 7.4 g/dL (ref 6.5–8.1)

## 2016-12-31 LAB — CBC
HCT: 44.9 % (ref 39.0–52.0)
HEMOGLOBIN: 16.6 g/dL (ref 13.0–17.0)
MCH: 32.1 pg (ref 26.0–34.0)
MCHC: 37 g/dL — AB (ref 30.0–36.0)
MCV: 86.8 fL (ref 78.0–100.0)
Platelets: 231 10*3/uL (ref 150–400)
RBC: 5.17 MIL/uL (ref 4.22–5.81)
RDW: 14.1 % (ref 11.5–15.5)
WBC: 14.1 10*3/uL — AB (ref 4.0–10.5)

## 2016-12-31 LAB — LIPASE, BLOOD: Lipase: 143 U/L — ABNORMAL HIGH (ref 11–51)

## 2016-12-31 MED ORDER — GI COCKTAIL ~~LOC~~
30.0000 mL | Freq: Once | ORAL | Status: AC
  Administered 2016-12-31: 30 mL via ORAL
  Filled 2016-12-31: qty 30

## 2016-12-31 MED ORDER — SODIUM CHLORIDE 0.9 % IV BOLUS (SEPSIS)
1000.0000 mL | Freq: Once | INTRAVENOUS | Status: AC
  Administered 2016-12-31: 1000 mL via INTRAVENOUS

## 2016-12-31 MED ORDER — HYDROMORPHONE HCL 1 MG/ML IJ SOLN
1.0000 mg | Freq: Once | INTRAMUSCULAR | Status: AC
  Administered 2016-12-31: 1 mg via INTRAVENOUS
  Filled 2016-12-31: qty 1

## 2016-12-31 MED ORDER — ONDANSETRON HCL 4 MG/2ML IJ SOLN
4.0000 mg | Freq: Once | INTRAMUSCULAR | Status: AC
  Administered 2016-12-31: 4 mg via INTRAVENOUS
  Filled 2016-12-31: qty 2

## 2016-12-31 NOTE — ED Triage Notes (Signed)
Per [facility] patient from home c/o abd pain that started 5 hours ago. Patient has PMh chron's disease. Reports hasnt had BM today, denies any urinary problems. Taken 6 of prednisone which supposed to only take 3 but pain so bad.

## 2017-01-01 ENCOUNTER — Emergency Department (HOSPITAL_COMMUNITY): Payer: Medicare Other

## 2017-01-01 ENCOUNTER — Encounter (HOSPITAL_COMMUNITY): Payer: Self-pay

## 2017-01-01 DIAGNOSIS — R1013 Epigastric pain: Secondary | ICD-10-CM | POA: Diagnosis not present

## 2017-01-01 MED ORDER — IOPAMIDOL (ISOVUE-300) INJECTION 61%
100.0000 mL | Freq: Once | INTRAVENOUS | Status: AC | PRN
  Administered 2017-01-01: 100 mL via INTRAVENOUS

## 2017-01-01 MED ORDER — HYDROCODONE-ACETAMINOPHEN 5-325 MG PO TABS: 1.0000 | ORAL_TABLET | Freq: Four times a day (QID) | ORAL | 0 refills | Status: DC | PRN

## 2017-01-01 MED ORDER — IOPAMIDOL (ISOVUE-300) INJECTION 61%
INTRAVENOUS | Status: AC
  Administered 2017-01-01: 100 mL via INTRAVENOUS
  Filled 2017-01-01: qty 100

## 2017-01-01 MED ORDER — ONDANSETRON 4 MG PO TBDP: 4.0000 mg | ORAL_TABLET | Freq: Three times a day (TID) | ORAL | 0 refills | Status: DC | PRN

## 2017-01-01 MED ORDER — HYDROMORPHONE HCL 1 MG/ML IJ SOLN
1.0000 mg | Freq: Once | INTRAMUSCULAR | Status: AC
  Administered 2017-01-01: 1 mg via INTRAVENOUS
  Filled 2017-01-01: qty 1

## 2017-01-01 MED ORDER — SODIUM CHLORIDE 0.9 % IV BOLUS (SEPSIS)
1000.0000 mL | Freq: Once | INTRAVENOUS | Status: AC
  Administered 2017-01-01: 1000 mL via INTRAVENOUS

## 2017-01-01 MED ORDER — ONDANSETRON HCL 4 MG/2ML IJ SOLN
4.0000 mg | Freq: Once | INTRAMUSCULAR | Status: AC
  Administered 2017-01-01: 4 mg via INTRAVENOUS
  Filled 2017-01-01: qty 2

## 2017-01-01 NOTE — ED Provider Notes (Signed)
[place] DEPT Provider Note   CSN: [id number] Arrival date & time: 12/31/16  2563     History   Chief Complaint Chief Complaint  Patient presents with  . Abdominal Pain    HPI Jeffery Mcintosh is a 36 y.o. male.  Patient presentsPatient presents to the ED with a chief complaint of epigastric abdominal pain.  He reports a history of Crohn's.  He states that his symptoms started yesterday morning.  He also reports having a burning sensation in his throat.  He states that he normally takes omeprazole for this, but hasn't taken any today.  He denies any diarrhea or blood in stools.  He states that he did take 82m of prednisone, but hasn't had any relief.  States that he had some wine at Thanksgiving, but otherwise denies any alcohol use.   The history is provided by the patient. No language interpreter was used.    Past Medical History:  Diagnosis Date  . Anxiety   . Arthritis   . Bipolar affective ([place]   . Chronic headaches   . Crohn's disease ([place] history of  . Depression   . GERD (gastroesophageal reflux disease)   . Hypertension   . Schizophrenic disorder ([facility]     Patient Active Problem List   Diagnosis Date Noted  . Crohn's colitis, unspecified complication ([place] 089/37/3428 . Epigastric burning sensation 01/03/2015  . Numbness and tingling in left arm 11/15/2014  . Exacerbation of Crohn's disease ([place] 10/21/2014  . Crohn's disease ([place] 10/21/2014  . Diarrhea 10/21/2014  . Abdominal pain   . Gout 04/18/2013  . Cubital tunnel syndrome 04/18/2013  . Abnormal urinalysis 04/18/2013    Past Surgical History:  Procedure Laterality Date  . NO PAST SURGERIES         Home Medications    Prior to Admission medications   Medication Sig Start Date End Date Taking? Authorizing Provider  alum & mag hydroxide-simeth (MAALOX PLUS) 400-400-40 MG/5ML suspension Take 5 mLs by mouth every 6 (six) hours as needed for indigestion.    Yes [provider]  Multiple Vitamins-Minerals (AIRBORNE) CHEW Chew 1 tablet by mouth daily.   Yes [provider]  omeprazole (PRILOSEC) 40 MG capsule Take 1 capsule (40 mg total) by mouth daily. Patient taking differently: Take 40 mg by mouth daily as needed (heartburn).  10/17/16  Yes SNita Sells MD  predniSONE (DELTASONE) 20 MG tablet Take 20 mg by mouth 3 (three) times daily.  12/10/16  Yes [provider]  dicyclomine (BENTYL) 10 MG capsule Take 1 capsule (10 mg total) by mouth every 6 (six) hours as needed for spasms (cramping). Patient not taking: Reported on 10/15/2016 10/23/14   Rai, RVernelle Emerald MD  Melatonin 2.5 MG CAPS Take 2.5 mg by mouth at bedtime as needed (sleep).     [provider]  omeprazole (PRILOSEC) 20 MG capsule Take 20 mg by mouth daily as needed (heartburn).     [provider]  predniSONE (DELTASONE) 10 MG tablet TAKE 3 TABLETS (30 MG TOTAL) BY MOUTH DAILY WITH BREAKFAST. Patient not taking: Reported on 12/31/2016 10/17/16   SNita Sells MD  traMADol (ULTRAM) 50 MG tablet Take 1 tablet (50 mg total) by mouth every 6 (six) hours as needed. Patient not taking: Reported on 10/15/2016 01/16/15   SLadene Artist MD    Family History Family History  Problem Relation Age of Onset  . Hypertension Mother   . Heart attack Mother   .  Stroke Mother   . Alcohol abuse Father   . Mental illness Father   . Multiple sclerosis Father   . Arthritis Maternal Grandmother   . Hypertension Maternal Grandmother   . Heart disease Maternal Grandmother   . Kidney disease Maternal Grandmother   . Diabetes Maternal Grandmother   . Cancer Maternal Grandmother        great, type unknown  . Hyperlipidemia Maternal Grandfather   . Colon polyps Maternal Grandfather   . Stroke Paternal Grandmother   . Ulcerative colitis Maternal Aunt     Social History Social History   Tobacco Use  . Smoking status: Current Every Day  Smoker    Types: Cigars  . Smokeless tobacco: Never Used  Substance Use Topics  . Alcohol use: No    Alcohol/week: 0.0 oz  . Drug use: No     Allergies   Patient has no known allergies.   Review of Systems Review of Systems  All other systems reviewed and are negative.    Physical Exam Updated Vital Signs BP (!) 145/80   Pulse 71   Temp 97.9 F (36.6 C) (Oral)   Resp 18   Ht 5' 4.5" (1.638 m)   Wt 80.3 kg (177 lb)   SpO2 98%   BMI 29.91 kg/m   Physical Exam  Constitutional: He is oriented to person, place, and time. He appears well-developed and well-nourished.  HENT:  Head: Normocephalic and atraumatic.  Eyes: Conjunctivae and EOM are normal. Pupils are equal, round, and reactive to light. Right eye exhibits no discharge. Left eye exhibits no discharge. No scleral icterus.  Neck: Normal range of motion. Neck supple. No JVD present.  Cardiovascular: Normal rate, regular rhythm and normal heart sounds. Exam reveals no gallop and no friction rub.  No murmur heard. Pulmonary/Chest: Effort normal and breath sounds normal. No respiratory distress. He has no wheezes. He has no rales. He exhibits no tenderness.  Abdominal: Soft. He exhibits no distension and no mass. There is no tenderness. There is no rebound and no guarding.  Mild epigastric abdominal tendnerness  Musculoskeletal: Normal range of motion. He exhibits no edema or tenderness.  Neurological: He is alert and oriented to person, place, and time.  Skin: Skin is warm and dry.  Psychiatric: He has a normal mood and affect. His behavior is normal. Judgment and thought content normal.  Nursing note and vitals reviewed.    ED Treatments / Results  Labs (all labs ordered are listed, but only abnormal results are displayed) Labs Reviewed  LIPASE, BLOOD - Abnormal; Notable for the following components:      Result Value   Lipase 143 (*)    All other components within normal limits  COMPREHENSIVE METABOLIC  PANEL - Abnormal; Notable for the following components:   Glucose, Bld 139 (*)    All other components within normal limits  CBC - Abnormal; Notable for the following components:   WBC 14.1 (*)    MCHC 37.0 (*)    All other components within normal limits    EKG  EKG Interpretation None       Radiology No results found.  Procedures Procedures (including critical care time)  Medications Ordered in ED Medications  HYDROmorphone (DILAUDID) injection 1 mg (1 mg Intravenous Given 12/31/16 2306)  ondansetron (ZOFRAN) injection 4 mg (4 mg Intravenous Given 12/31/16 2305)  sodium chloride 0.9 % bolus 1,000 mL (1,000 mLs Intravenous New Bag/Given 12/31/16 2305)  gi cocktail (Maalox,Lidocaine,Donnatal) (30 mLs Oral Given  12/31/16 2342)     Initial Impression / Assessment and Plan / ED Course  I have reviewed the triage vital signs and the nursing notes.  Pertinent labs & imaging results that were available during my care of the patient were reviewed by me and considered in my medical decision making (see chart for details).     Patient epigastric abdominal pain x 1 day.  Lipase is elevated at 143.  Will check CT.  Normal LFTs, no RUQ tenderness.  No fever.    CT is unremarkable for acute findings.  Patient's pain is well controlled.  Could be mild pancreatitis vs PUD vs GERD.  VSS.  As pain is controlled, will discharge to home with GI follow-up.  Advised patient to resume his omeprazole.  Patient understands and agrees with the plan.  He is stable and ready for discharge.  Final Clinical Impressions(s) / ED Diagnoses   Final diagnoses:  Epigastric pain    ED Discharge Orders        Ordered    HYDROcodone-acetaminophen (NORCO/VICODIN) 5-325 MG tablet  Every 6 hours PRN     01/01/17 0328    ondansetron (ZOFRAN ODT) 4 MG disintegrating tablet  Every 8 hours PRN     01/01/17 0328       Montine Circle, PA-C 01/01/17 3014    Veryl Speak, MD 01/01/17 [id number]

## 2017-01-01 NOTE — Discharge Instructions (Signed)
Take your Omeprazole 1 tablet in the morning and night for the next 2 weeks.

## 2017-05-01 ENCOUNTER — Ambulatory Visit
Admission: RE | Admit: 2017-05-01 | Discharge: 2017-05-01 | Disposition: A | Discharge status: Home / Self Care | Payer: Medicare Other | Source: Ambulatory Visit | Attending: Gastroenterology | Admitting: Gastroenterology

## 2017-05-01 ENCOUNTER — Other Ambulatory Visit: Payer: Self-pay | Admitting: Gastroenterology

## 2017-05-01 DIAGNOSIS — R197 Diarrhea, unspecified: Secondary | ICD-10-CM

## 2017-05-01 DIAGNOSIS — R1084 Generalized abdominal pain: Secondary | ICD-10-CM

## 2017-05-01 DIAGNOSIS — K50813 Crohn's disease of both small and large intestine with fistula: Secondary | ICD-10-CM

## 2017-05-01 MED ORDER — IOPAMIDOL (ISOVUE-300) INJECTION 61%
100.0000 mL | Freq: Once | INTRAVENOUS | Status: AC | PRN
  Administered 2017-05-01: 100 mL via INTRAVENOUS

## 2017-07-24 ENCOUNTER — Emergency Department (HOSPITAL_COMMUNITY)
Admission: EM | Admit: 2017-07-24 | Discharge: 2017-07-24 | Disposition: A | Discharge status: Home / Self Care | Payer: Medicare Other | Attending: Emergency Medicine | Admitting: Emergency Medicine

## 2017-07-24 ENCOUNTER — Emergency Department (HOSPITAL_COMMUNITY): Payer: Medicare Other

## 2017-07-24 DIAGNOSIS — R109 Unspecified abdominal pain: Secondary | ICD-10-CM | POA: Diagnosis present

## 2017-07-24 DIAGNOSIS — Z79899 Other long term (current) drug therapy: Secondary | ICD-10-CM | POA: Diagnosis not present

## 2017-07-24 DIAGNOSIS — K50919 Crohn's disease, unspecified, with unspecified complications: Secondary | ICD-10-CM | POA: Insufficient documentation

## 2017-07-24 DIAGNOSIS — F17290 Nicotine dependence, other tobacco product, uncomplicated: Secondary | ICD-10-CM | POA: Diagnosis not present

## 2017-07-24 DIAGNOSIS — I10 Essential (primary) hypertension: Secondary | ICD-10-CM | POA: Insufficient documentation

## 2017-07-24 LAB — COMPREHENSIVE METABOLIC PANEL
ALT: 26 U/L (ref 17–63)
ANION GAP: 8 (ref 5–15)
AST: 17 U/L (ref 15–41)
Albumin: 3.7 g/dL (ref 3.5–5.0)
Alkaline Phosphatase: 66 U/L (ref 38–126)
BUN: 14 mg/dL (ref 6–20)
CHLORIDE: 109 mmol/L (ref 101–111)
CO2: 25 mmol/L (ref 22–32)
Calcium: 8.8 mg/dL — ABNORMAL LOW (ref 8.9–10.3)
Creatinine, Ser: 0.86 mg/dL (ref 0.61–1.24)
GFR calc Af Amer: >60 mL/min (ref >60)
GFR calc non Af Amer: >60 mL/min (ref >60)
Glucose, Bld: 95 mg/dL (ref 65–99)
POTASSIUM: 3.5 mmol/L (ref 3.5–5.1)
SODIUM: 142 mmol/L (ref 135–145)
TOTAL PROTEIN: 6.4 g/dL — AB (ref 6.5–8.1)
Total Bilirubin: 0.4 mg/dL (ref 0.3–1.2)

## 2017-07-24 LAB — URINALYSIS, ROUTINE W REFLEX MICROSCOPIC
Bilirubin Urine: NEGATIVE
GLUCOSE, UA: NEGATIVE mg/dL
Hgb urine dipstick: NEGATIVE
KETONES UR: NEGATIVE mg/dL
Leukocytes, UA: NEGATIVE
NITRITE: NEGATIVE
PH: 6 (ref 5.0–8.0)
Protein, ur: NEGATIVE mg/dL
SPECIFIC GRAVITY, URINE: 1.017 (ref 1.005–1.030)

## 2017-07-24 LAB — CBC
HEMATOCRIT: 46.3 % (ref 39.0–52.0)
HEMOGLOBIN: 16.3 g/dL (ref 13.0–17.0)
MCH: 31 pg (ref 26.0–34.0)
MCHC: 35.2 g/dL (ref 30.0–36.0)
MCV: 88 fL (ref 78.0–100.0)
Platelets: 279 10*3/uL (ref 150–400)
RBC: 5.26 MIL/uL (ref 4.22–5.81)
RDW: 13.2 % (ref 11.5–15.5)
WBC: 17.4 10*3/uL — AB (ref 4.0–10.5)

## 2017-07-24 LAB — I-STAT TROPONIN, ED: Troponin i, poc: 0 ng/mL (ref 0.00–0.08)

## 2017-07-24 LAB — LIPASE, BLOOD: LIPASE: 38 U/L (ref 11–51)

## 2017-07-24 MED ORDER — HYDROMORPHONE HCL 1 MG/ML IJ SOLN
1.0000 mg | Freq: Once | INTRAMUSCULAR | Status: AC
  Administered 2017-07-24: 1 mg via INTRAVENOUS
  Filled 2017-07-24: qty 1

## 2017-07-24 MED ORDER — SODIUM CHLORIDE 0.9 % IV BOLUS
1000.0000 mL | Freq: Once | INTRAVENOUS | Status: AC
  Administered 2017-07-24: 1000 mL via INTRAVENOUS

## 2017-07-24 MED ORDER — ONDANSETRON HCL 4 MG/2ML IJ SOLN
4.0000 mg | Freq: Once | INTRAMUSCULAR | Status: AC
  Administered 2017-07-24: 4 mg via INTRAVENOUS
  Filled 2017-07-24: qty 2

## 2017-07-24 MED ORDER — IOHEXOL 300 MG/ML  SOLN
100.0000 mL | Freq: Once | INTRAMUSCULAR | Status: AC
  Administered 2017-07-24: 100 mL via INTRAVENOUS

## 2017-07-24 MED ORDER — OXYCODONE-ACETAMINOPHEN 5-325 MG PO TABS: 1.0000 | ORAL_TABLET | ORAL | 0 refills | Status: DC | PRN

## 2017-07-24 MED ORDER — IOPAMIDOL (ISOVUE-300) INJECTION 61%
INTRAVENOUS | Status: AC
  Filled 2017-07-24: qty 30

## 2017-07-24 MED ORDER — PREDNISONE 10 MG PO TABS
10.0000 mg | ORAL_TABLET | Freq: Every day | ORAL | 0 refills | Status: DC
Start: 2017-07-24 — End: 2017-10-27

## 2017-07-24 MED ORDER — ONDANSETRON HCL 4 MG PO TABS: 4.0000 mg | ORAL_TABLET | Freq: Four times a day (QID) | ORAL | 0 refills | Status: DC

## 2017-07-24 MED ORDER — IOPAMIDOL (ISOVUE-300) INJECTION 61%: 30.0000 mL | INTRAVENOUS | Status: AC

## 2017-07-24 NOTE — ED Triage Notes (Signed)
Pt states he has been having a chron's flare since Saturday. He has been taking prednisone, put himself on a liquid diet and taking Cimzia injections. Pt states pain to entire abdomen, describes it as a bloating and a burning. Pt has not had a BM in a few days.

## 2017-07-24 NOTE — Discharge Instructions (Addendum)
Prescription for prednisone, pain medicine, nausea medicine.  Clear liquids.  Follow-up your doctor or return if worse.

## 2017-07-27 NOTE — ED Provider Notes (Signed)
Jeffery Mcintosh Provider Note   CSN: [id number] Arrival date & time: 07/24/17  9562     History   Chief Complaint Chief Complaint  Patient presents with  . Abdominal Pain    HPI Jeffery Mcintosh is a 37 y.o. male.  Patient with a known history of Crohn's disease and a fistula presents with persistent abdominal pain for proximately 1 week.  He has been taking prednisone and a liquid diet along with his normal Crohn's medication.  Pain is described as a bloating and burning sensation.  No BM recently.  No fever, sweats, chills.  Severity symptoms is moderate.  Nothing makes symptoms better or worse.     Past Medical History:  Diagnosis Date  . Anxiety   . Arthritis   . Bipolar affective ([place])   . Chronic headaches   . Crohn's disease ([place]) history of  . Depression   . GERD (gastroesophageal reflux disease)   . Hypertension   . Schizophrenic disorder [facility])     Patient Active Problem List   Diagnosis Date Noted  . Crohn's colitis, unspecified complication ([place]) 13/09/6576  . Epigastric burning sensation 01/03/2015  . Numbness and tingling in left arm 11/15/2014  . Exacerbation of Crohn's disease ([place]) 10/21/2014  . Crohn's disease ([place]) 10/21/2014  . Diarrhea 10/21/2014  . Abdominal pain   . Gout 04/18/2013  . Cubital tunnel syndrome 04/18/2013  . Abnormal urinalysis 04/18/2013    Past Surgical History:  Procedure Laterality Date  . NO PAST SURGERIES          Home Medications    Prior to Admission medications   Medication Sig Start Date End Date Taking? Authorizing Provider  CIMZIA PREFILLED 2 X 200 MG/ML KIT Inject 200 mg into the skin every 30 (thirty) days. 07/21/17  Yes [provider]  gabapentin (NEURONTIN) 300 MG capsule Take 600 mg by mouth 3 (three) times daily.   Yes [provider]  omeprazole (PRILOSEC) 40 MG capsule Take 1 capsule (40 mg total) by mouth daily. Patient taking differently:  Take 40 mg by mouth daily as needed (heartburn).  10/17/16  Yes Nita Sells, MD  Probiotic Product (PROBIOTIC DAILY PO) Take 1 capsule by mouth daily.   Yes [provider]  dicyclomine (BENTYL) 10 MG capsule Take 1 capsule (10 mg total) by mouth every 6 (six) hours as needed for spasms (cramping). Patient not taking: Reported on 10/15/2016 10/23/14   Rai, Vernelle Emerald, MD  HYDROcodone-acetaminophen (NORCO/VICODIN) 5-325 MG tablet Take 1-2 tablets by mouth every 6 (six) hours as needed. Patient not taking: Reported on 07/24/2017 01/01/17   Montine Circle, PA-C  ondansetron (ZOFRAN ODT) 4 MG disintegrating tablet Take 1 tablet (4 mg total) by mouth every 8 (eight) hours as needed for nausea or vomiting. Patient not taking: Reported on 07/24/2017 01/01/17   Montine Circle, PA-C  ondansetron (ZOFRAN) 4 MG tablet Take 1 tablet (4 mg total) by mouth every 6 (six) hours. 07/24/17   Nat Christen, MD  oxyCODONE-acetaminophen (PERCOCET) 5-325 MG tablet Take 1 tablet by mouth every 4 (four) hours as needed. 07/24/17   Nat Christen, MD  predniSONE (DELTASONE) 10 MG tablet Take 1 tablet (10 mg total) by mouth daily with breakfast. 07/24/17   Nat Christen, MD  traMADol (ULTRAM) 50 MG tablet Take 1 tablet (50 mg total) by mouth every 6 (six) hours as needed. Patient not taking: Reported on 10/15/2016 01/16/15   Ladene Artist, MD    Family History  Family History  Problem Relation Age of Onset  . Hypertension Mother   . Heart attack Mother   . Stroke Mother   . Alcohol abuse Father   . Mental illness Father   . Multiple sclerosis Father   . Arthritis Maternal Grandmother   . Hypertension Maternal Grandmother   . Heart disease Maternal Grandmother   . Kidney disease Maternal Grandmother   . Diabetes Maternal Grandmother   . Cancer Maternal Grandmother        great, type unknown  . Hyperlipidemia Maternal Grandfather   . Colon polyps Maternal Grandfather   . Stroke Paternal Grandmother   .  Ulcerative colitis Maternal Aunt     Social History Social History   Tobacco Use  . Smoking status: Current Every Day Smoker    Types: Cigars  . Smokeless tobacco: Never Used  Substance Use Topics  . Alcohol use: No    Alcohol/week: 0.0 oz  . Drug use: No     Allergies   Patient has no known allergies.   Review of Systems Review of Systems  All other systems reviewed and are negative.    Physical Exam Updated Vital Signs BP (!) 147/90 (BP Location: Right Arm)   Pulse 62   Temp 97.7 F (36.5 C) (Oral)   Resp 16   Ht 5' 3"  (1.6 m)   Wt 76.2 kg (168 lb)   SpO2 97%   BMI 29.76 kg/m   Physical Exam  Constitutional: He is oriented to person, place, and time. He appears well-developed and well-nourished.  HENT:  Head: Normocephalic and atraumatic.  Eyes: Conjunctivae are normal.  Neck: Neck supple.  Cardiovascular: Normal rate and regular rhythm.  Pulmonary/Chest: Effort normal and breath sounds normal.  Abdominal:  Minimal generalized abdominal tenderness  Musculoskeletal: Normal range of motion.  Neurological: He is alert and oriented to person, place, and time.  Skin: Skin is warm and dry.  Psychiatric: He has a normal mood and affect. His behavior is normal.  Nursing note and vitals reviewed.    ED Treatments / Results  Labs (all labs ordered are listed, but only abnormal results are displayed) Labs Reviewed  COMPREHENSIVE METABOLIC PANEL - Abnormal; Notable for the following components:      Result Value   Calcium 8.8 (*)    Total Protein 6.4 (*)    All other components within normal limits  CBC - Abnormal; Notable for the following components:   WBC 17.4 (*)    All other components within normal limits  LIPASE, BLOOD  URINALYSIS, ROUTINE W REFLEX MICROSCOPIC  I-STAT TROPONIN, ED    EKG EKG Interpretation  Date/Time:  Friday July 24 2017 07:28:33 EDT Ventricular Rate:  82 PR Interval:  136 QRS Duration: 106 QT Interval:  352 QTC  Calculation: 411 R Axis:   83 Text Interpretation:  Normal sinus rhythm Minimal voltage criteria for LVH, may be normal variant T wave abnormality, consider inferolateral ischemia Abnormal ECG Confirmed by Nat Christen [phone]) on 07/24/2017 11:04:46 AM   Radiology No results found.  Procedures Procedures (including critical care time)  Medications Ordered in ED Medications  iopamidol (ISOVUE-300) 61 % injection 30 mL (has no administration in time range)  ondansetron (ZOFRAN) injection 4 mg (4 mg Intravenous Given 07/24/17 0922)  HYDROmorphone (DILAUDID) injection 1 mg (1 mg Intravenous Given 07/24/17 0923)  sodium chloride 0.9 % bolus 1,000 mL (0 mLs Intravenous Stopped 07/24/17 1025)  iohexol (OMNIPAQUE) 300 MG/ML solution 100 mL (100 mLs Intravenous Contrast  Given 07/24/17 0915)  HYDROmorphone (DILAUDID) injection 1 mg (1 mg Intravenous Given 07/24/17 1427)  ondansetron (ZOFRAN) injection 4 mg (4 mg Intravenous Given 07/24/17 1427)  HYDROmorphone (DILAUDID) injection 1 mg (1 mg Intravenous Given 07/24/17 1557)     Initial Impression / Assessment and Plan / ED Course  I have reviewed the triage vital signs and the nursing notes.  Pertinent labs & imaging results that were available during my care of the patient were reviewed by me and considered in my medical decision making (see chart for details).     Patient presents with abdominal pain consistent with a Crohn's flareup.  CT scan reveals evidence of Crohn's disease involving the distal ileum similar in appearance to a CT from 05/01/2017.  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  Patient is feeling much better after fluids and pain management.  He wants to attempt to go home and see how he does as an outpatient.  Discharge medications Percocet, Zofran 4 mg, prednisone.  Final Clinical Impressions(s) / ED Diagnoses   Final diagnoses:  Crohn's disease with complication, unspecified gastrointestinal tract location  [facility])    ED Discharge Orders        Ordered    oxyCODONE-acetaminophen (PERCOCET) 5-325 MG tablet  Every 4 hours PRN     07/24/17 1621    ondansetron (ZOFRAN) 4 MG tablet  Every 6 hours     07/24/17 1621    predniSONE (DELTASONE) 10 MG tablet  Daily with breakfast     07/24/17 1621       Nat Christen, MD 07/27/17 2202

## 2017-10-26 ENCOUNTER — Encounter (HOSPITAL_COMMUNITY): Payer: Self-pay

## 2017-10-26 ENCOUNTER — Inpatient Hospital Stay (HOSPITAL_COMMUNITY)
Admission: EM | Admit: 2017-10-26 | Discharge: 2017-10-27 | DRG: 387 | Disposition: A | Discharge status: Home / Self Care | Payer: Medicare Other | Attending: Internal Medicine | Admitting: Internal Medicine

## 2017-10-26 ENCOUNTER — Emergency Department (HOSPITAL_COMMUNITY): Payer: Medicare Other

## 2017-10-26 ENCOUNTER — Other Ambulatory Visit: Payer: Self-pay

## 2017-10-26 DIAGNOSIS — K50813 Crohn's disease of both small and large intestine with fistula: Principal | ICD-10-CM | POA: Diagnosis present

## 2017-10-26 DIAGNOSIS — Z8371 Family history of colonic polyps: Secondary | ICD-10-CM

## 2017-10-26 DIAGNOSIS — Z79891 Long term (current) use of opiate analgesic: Secondary | ICD-10-CM | POA: Diagnosis not present

## 2017-10-26 DIAGNOSIS — Z833 Family history of diabetes mellitus: Secondary | ICD-10-CM

## 2017-10-26 DIAGNOSIS — K219 Gastro-esophageal reflux disease without esophagitis: Secondary | ICD-10-CM | POA: Diagnosis present

## 2017-10-26 DIAGNOSIS — Z8249 Family history of ischemic heart disease and other diseases of the circulatory system: Secondary | ICD-10-CM | POA: Diagnosis not present

## 2017-10-26 DIAGNOSIS — Z8261 Family history of arthritis: Secondary | ICD-10-CM | POA: Diagnosis not present

## 2017-10-26 DIAGNOSIS — Z8349 Family history of other endocrine, nutritional and metabolic diseases: Secondary | ICD-10-CM | POA: Diagnosis not present

## 2017-10-26 DIAGNOSIS — Z79899 Other long term (current) drug therapy: Secondary | ICD-10-CM | POA: Diagnosis not present

## 2017-10-26 DIAGNOSIS — K50012 Crohn's disease of small intestine with intestinal obstruction: Secondary | ICD-10-CM | POA: Diagnosis not present

## 2017-10-26 DIAGNOSIS — F17290 Nicotine dependence, other tobacco product, uncomplicated: Secondary | ICD-10-CM | POA: Diagnosis present

## 2017-10-26 DIAGNOSIS — Z820 Family history of epilepsy and other diseases of the nervous system: Secondary | ICD-10-CM

## 2017-10-26 DIAGNOSIS — K56609 Unspecified intestinal obstruction, unspecified as to partial versus complete obstruction: Secondary | ICD-10-CM | POA: Diagnosis present

## 2017-10-26 DIAGNOSIS — Z8379 Family history of other diseases of the digestive system: Secondary | ICD-10-CM | POA: Diagnosis not present

## 2017-10-26 DIAGNOSIS — Z823 Family history of stroke: Secondary | ICD-10-CM | POA: Diagnosis not present

## 2017-10-26 DIAGNOSIS — I10 Essential (primary) hypertension: Secondary | ICD-10-CM | POA: Diagnosis present

## 2017-10-26 DIAGNOSIS — Z818 Family history of other mental and behavioral disorders: Secondary | ICD-10-CM | POA: Diagnosis not present

## 2017-10-26 DIAGNOSIS — F319 Bipolar disorder, unspecified: Secondary | ICD-10-CM | POA: Diagnosis not present

## 2017-10-26 DIAGNOSIS — Z841 Family history of disorders of kidney and ureter: Secondary | ICD-10-CM | POA: Diagnosis not present

## 2017-10-26 DIAGNOSIS — R1011 Right upper quadrant pain: Secondary | ICD-10-CM | POA: Diagnosis not present

## 2017-10-26 DIAGNOSIS — Z7952 Long term (current) use of systemic steroids: Secondary | ICD-10-CM | POA: Diagnosis not present

## 2017-10-26 DIAGNOSIS — Z811 Family history of alcohol abuse and dependence: Secondary | ICD-10-CM

## 2017-10-26 DIAGNOSIS — K50913 Crohn's disease, unspecified, with fistula: Secondary | ICD-10-CM

## 2017-10-26 DIAGNOSIS — K56600 Partial intestinal obstruction, unspecified as to cause: Secondary | ICD-10-CM

## 2017-10-26 LAB — CREATININE, SERUM: Creatinine, Ser: 1 mg/dL (ref 0.61–1.24)

## 2017-10-26 LAB — URINALYSIS, ROUTINE W REFLEX MICROSCOPIC
BILIRUBIN URINE: NEGATIVE
Glucose, UA: NEGATIVE mg/dL
HGB URINE DIPSTICK: NEGATIVE
Ketones, ur: NEGATIVE mg/dL
Leukocytes, UA: NEGATIVE
NITRITE: NEGATIVE
PH: 5 (ref 5.0–8.0)
Protein, ur: NEGATIVE mg/dL
SPECIFIC GRAVITY, URINE: 1.015 (ref 1.005–1.030)

## 2017-10-26 LAB — CBC
HCT: 45.8 % (ref 39.0–52.0)
HEMATOCRIT: 43.5 % (ref 39.0–52.0)
HEMOGLOBIN: 15.6 g/dL (ref 13.0–17.0)
HEMOGLOBIN: 15 g/dL (ref 13.0–17.0)
MCH: 31.4 pg (ref 26.0–34.0)
MCH: 31.2 pg (ref 26.0–34.0)
MCHC: 34.1 g/dL (ref 30.0–36.0)
MCHC: 34.5 g/dL (ref 30.0–36.0)
MCV: 92.2 fL (ref 78.0–100.0)
MCV: 90.4 fL (ref 78.0–100.0)
Platelets: 259 10*3/uL (ref 150–400)
Platelets: 251 10*3/uL (ref 150–400)
RBC: 4.97 MIL/uL (ref 4.22–5.81)
RBC: 4.81 MIL/uL (ref 4.22–5.81)
RDW: 13.2 % (ref 11.5–15.5)
RDW: 13.1 % (ref 11.5–15.5)
WBC: 6.7 10*3/uL (ref 4.0–10.5)
WBC: 9.6 10*3/uL (ref 4.0–10.5)

## 2017-10-26 LAB — COMPREHENSIVE METABOLIC PANEL
ALT: 19 U/L (ref 0–44)
AST: 17 U/L (ref 15–41)
Albumin: 3.7 g/dL (ref 3.5–5.0)
Alkaline Phosphatase: 94 U/L (ref 38–126)
Anion gap: 11 (ref 5–15)
CHLORIDE: 103 mmol/L (ref 98–111)
CO2: 28 mmol/L (ref 22–32)
CREATININE: 1.06 mg/dL (ref 0.61–1.24)
Calcium: 9.5 mg/dL (ref 8.9–10.3)
GFR calc Af Amer: >60 mL/min (ref >60)
GFR calc non Af Amer: >60 mL/min (ref >60)
GLUCOSE: 90 mg/dL (ref 70–99)
Potassium: 4.3 mmol/L (ref 3.5–5.1)
SODIUM: 142 mmol/L (ref 135–145)
Total Bilirubin: 0.8 mg/dL (ref 0.3–1.2)
Total Protein: 6.7 g/dL (ref 6.5–8.1)

## 2017-10-26 LAB — SEDIMENTATION RATE: Sed Rate: 13 mm/hr (ref 0–16)

## 2017-10-26 LAB — C-REACTIVE PROTEIN: CRP: 1.4 mg/dL — ABNORMAL HIGH (ref <1.0)

## 2017-10-26 LAB — LIPASE, BLOOD: LIPASE: 30 U/L (ref 11–51)

## 2017-10-26 MED ORDER — ONDANSETRON HCL 4 MG PO TABS: 4.0000 mg | ORAL_TABLET | Freq: Four times a day (QID) | ORAL | Status: DC | PRN

## 2017-10-26 MED ORDER — HYDROMORPHONE HCL 1 MG/ML IJ SOLN
1.0000 mg | Freq: Once | INTRAMUSCULAR | Status: AC
  Administered 2017-10-26: 1 mg via INTRAVENOUS
  Filled 2017-10-26: qty 1

## 2017-10-26 MED ORDER — ALBUTEROL SULFATE (2.5 MG/3ML) 0.083% IN NEBU: 2.5000 mg | INHALATION_SOLUTION | RESPIRATORY_TRACT | Status: DC | PRN

## 2017-10-26 MED ORDER — METHYLPREDNISOLONE SODIUM SUCC 125 MG IJ SOLR
60.0000 mg | Freq: Four times a day (QID) | INTRAMUSCULAR | Status: DC
  Administered 2017-10-26: 60 mg via INTRAVENOUS
  Filled 2017-10-26: qty 2

## 2017-10-26 MED ORDER — SODIUM CHLORIDE 0.9 % IV BOLUS
1000.0000 mL | Freq: Once | INTRAVENOUS | Status: AC
  Administered 2017-10-26: 1000 mL via INTRAVENOUS

## 2017-10-26 MED ORDER — ENOXAPARIN SODIUM 40 MG/0.4ML ~~LOC~~ SOLN
40.0000 mg | SUBCUTANEOUS | Status: DC
  Filled 2017-10-26: qty 0.4

## 2017-10-26 MED ORDER — OXYCODONE HCL 5 MG PO TABS
5.0000 mg | ORAL_TABLET | Freq: Four times a day (QID) | ORAL | Status: DC | PRN
  Administered 2017-10-26 – 2017-10-27 (×2): 5 mg via ORAL
  Filled 2017-10-26 (×2): qty 1

## 2017-10-26 MED ORDER — ONDANSETRON HCL 4 MG/2ML IJ SOLN: 4.0000 mg | Freq: Four times a day (QID) | INTRAMUSCULAR | Status: DC | PRN

## 2017-10-26 MED ORDER — IOHEXOL 300 MG/ML  SOLN
100.0000 mL | Freq: Once | INTRAMUSCULAR | Status: AC | PRN
  Administered 2017-10-26: 100 mL via INTRAVENOUS

## 2017-10-26 MED ORDER — SODIUM CHLORIDE 0.9 % IV SOLN
INTRAVENOUS | Status: DC
  Administered 2017-10-26 – 2017-10-27 (×2): via INTRAVENOUS

## 2017-10-26 MED ORDER — HYDROMORPHONE HCL 1 MG/ML IJ SOLN
1.0000 mg | Freq: Four times a day (QID) | INTRAMUSCULAR | Status: DC | PRN
  Administered 2017-10-26 – 2017-10-27 (×3): 1 mg via INTRAVENOUS
  Filled 2017-10-26 (×3): qty 1

## 2017-10-26 MED ORDER — PANTOPRAZOLE SODIUM 40 MG PO TBEC
40.0000 mg | DELAYED_RELEASE_TABLET | Freq: Every day | ORAL | Status: DC
  Administered 2017-10-26 – 2017-10-27 (×2): 40 mg via ORAL
  Filled 2017-10-26 (×2): qty 1

## 2017-10-26 MED ORDER — MORPHINE SULFATE (PF) 2 MG/ML IV SOLN: 1.0000 mg | INTRAVENOUS | Status: DC | PRN

## 2017-10-26 MED ORDER — FAMOTIDINE IN NACL 20-0.9 MG/50ML-% IV SOLN
20.0000 mg | Freq: Once | INTRAVENOUS | Status: AC
  Administered 2017-10-26: 20 mg via INTRAVENOUS
  Filled 2017-10-26: qty 50

## 2017-10-26 MED ORDER — METHYLPREDNISOLONE SODIUM SUCC 125 MG IJ SOLR
60.0000 mg | Freq: Four times a day (QID) | INTRAMUSCULAR | Status: DC
  Administered 2017-10-26 – 2017-10-27 (×3): 60 mg via INTRAVENOUS
  Filled 2017-10-26 (×3): qty 2

## 2017-10-26 MED ORDER — TRAZODONE HCL 50 MG PO TABS
50.0000 mg | ORAL_TABLET | Freq: Every day | ORAL | Status: DC
  Administered 2017-10-26: 50 mg via ORAL
  Filled 2017-10-26: qty 1

## 2017-10-26 MED ORDER — ONDANSETRON HCL 4 MG/2ML IJ SOLN
4.0000 mg | Freq: Once | INTRAMUSCULAR | Status: AC
  Administered 2017-10-26: 4 mg via INTRAVENOUS
  Filled 2017-10-26: qty 2

## 2017-10-26 MED ORDER — LAMOTRIGINE 25 MG PO TABS
25.0000 mg | ORAL_TABLET | Freq: Two times a day (BID) | ORAL | Status: DC
  Administered 2017-10-26 – 2017-10-27 (×2): 25 mg via ORAL
  Filled 2017-10-26 (×2): qty 1

## 2017-10-26 NOTE — Consult Note (Addendum)
Referring Provider:  ER Primary Care Physician:  [facility] Primary Gastroenterologist:  Dr. Michail Sermon  Reason for Consultation:  Abdominal pain, Crohn's exacerbation  HPI: Jeffery Mcintosh is a 37 y.o. 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 Fuller Plan but was dismissed in 2016. Patient was scheduled to see Dr. Michail Sermon in the office in September but he forgot about his appointment.  He was doing fine until a few days ago when he started noticing bilateral lower quadrant abdominal discomfort associated with nausea and vomiting. Usually have 2-3 loose stools per day and he denied any worsening of diarrhea symptoms. Denies any blood in the stool or black stool. He had few episodes of nausea and nonbloody vomiting. Denies any fever or chills. Complaining of acid reflux. Denies dysphagia or odynophagia.  Colonoscopy October 2016 by Dr. Fuller Plan showed Quiescent colitis and 1 sessile serrated adenoma. Repeat colonoscopy was recommended October 2021.  Past Medical History:  Diagnosis Date  . Anxiety   . Arthritis   . Bipolar affective ([place])   . Chronic headaches   . Crohn's disease ([place]) history of  . Depression   . GERD (gastroesophageal reflux disease)   . Hypertension   . Schizophrenic disorder [facility])     Past Surgical History:  Procedure Laterality Date  . NO PAST SURGERIES      Prior to Admission medications   Medication Sig Start Date End Date Taking? Authorizing Provider  CIMZIA PREFILLED 2 X 200 MG/ML KIT Inject 200 mg into the skin every 30 (thirty) days. 07/21/17   [provider]  dicyclomine (BENTYL) 10 MG capsule Take 1 capsule (10 mg total) by mouth every 6 (six) hours as needed for spasms (cramping). Patient not taking: Reported on 10/15/2016 10/23/14   Rai, Vernelle Emerald, MD   gabapentin (NEURONTIN) 300 MG capsule Take 600 mg by mouth 3 (three) times daily.    [provider]  HYDROcodone-acetaminophen (NORCO/VICODIN) 5-325 MG tablet Take 1-2 tablets by mouth every 6 (six) hours as needed. Patient not taking: Reported on 07/24/2017 01/01/17   Montine Circle, PA-C  omeprazole (PRILOSEC) 40 MG capsule Take 1 capsule (40 mg total) by mouth daily. Patient taking differently: Take 40 mg by mouth daily as needed (heartburn).  10/17/16   Nita Sells, MD  ondansetron (ZOFRAN ODT) 4 MG disintegrating tablet Take 1 tablet (4 mg total) by mouth every 8 (eight) hours as needed for nausea or vomiting. Patient not taking: Reported on 07/24/2017 01/01/17   Montine Circle, PA-C  ondansetron (ZOFRAN) 4 MG tablet Take 1 tablet (4 mg total) by mouth every 6 (six) hours. 07/24/17   Nat Christen, MD  oxyCODONE-acetaminophen (PERCOCET) 5-325 MG tablet Take 1 tablet by mouth every 4 (four) hours as needed. 07/24/17   Nat Christen, MD  predniSONE (DELTASONE) 10 MG tablet Take 1 tablet (10 mg total) by mouth daily with breakfast. 07/24/17   Nat Christen, MD  Probiotic Product (PROBIOTIC DAILY PO) Take 1 capsule by mouth daily.    [provider]  traMADol (ULTRAM) 50 MG tablet Take 1 tablet (50 mg total) by mouth every 6 (six) hours as needed. Patient not taking: Reported on 10/15/2016 01/16/15   Ladene Artist, MD    Scheduled Meds: Continuous Infusions: PRN Meds:.  Allergies as of 10/26/2017  . (No Known Allergies)    Family History  Problem  Relation Age of Onset  . Hypertension Mother   . Heart attack Mother   . Stroke Mother   . Alcohol abuse Father   . Mental illness Father   . Multiple sclerosis Father   . Arthritis Maternal Grandmother   . Hypertension Maternal Grandmother   . Heart disease Maternal Grandmother   . Kidney disease Maternal Grandmother   . Diabetes Maternal Grandmother   . Cancer Maternal Grandmother        great, type unknown  .  Hyperlipidemia Maternal Grandfather   . Colon polyps Maternal Grandfather   . Stroke Paternal Grandmother   . Ulcerative colitis Maternal Aunt     Social History   Socioeconomic History  . Marital status: Single    Spouse name: Not on file  . Number of children: 2  . Years of education: 67  . Highest education level: Not on file  Occupational History  . Occupation: disabled    Comment: Crohn's / Bipolar / Schizophrenia  Social Needs  . Financial resource strain: Not on file  . Food insecurity:    Worry: Not on file    Inability: Not on file  . Transportation needs:    Medical: Not on file    Non-medical: Not on file  Tobacco Use  . Smoking status: Current Every Day Smoker    Types: Cigars  . Smokeless tobacco: Never Used  Substance and Sexual Activity  . Alcohol use: No    Alcohol/week: 0.0 standard drinks  . Drug use: No  . Sexual activity: Never    Birth control/protection: None  Lifestyle  . Physical activity:    Days per week: Not on file    Minutes per session: Not on file  . Stress: Not on file  Relationships  . Social connections:    Talks on phone: Not on file    Gets together: Not on file    Attends religious service: Not on file    Active member of club or organization: Not on file    Attends meetings of clubs or organizations: Not on file    Relationship status: Not on file  . Intimate partner violence:    Fear of current or ex partner: Not on file    Emotionally abused: Not on file    Physically abused: Not on file    Forced sexual activity: Not on file  Other Topics Concern  . Not on file  Social History Narrative  . Not on file    Review of Systems: Review of Systems  Constitutional: Negative for chills, fever, malaise/fatigue and weight loss.  HENT: Negative for hearing loss and tinnitus.   Eyes: Negative for blurred vision and double vision.  Respiratory: Negative for cough and hemoptysis.   Cardiovascular: Negative for chest pain and  palpitations.  Gastrointestinal: Positive for abdominal pain, heartburn, nausea and vomiting. Negative for blood in stool and melena.  Genitourinary: Negative for dysuria and urgency.  Musculoskeletal: Negative for myalgias and neck pain.  Skin: Negative for itching and rash.  Neurological: Negative for seizures and loss of consciousness.  Endo/Heme/Allergies: Does not bruise/bleed easily.  Psychiatric/Behavioral: Negative for hallucinations and suicidal ideas.    Physical Exam: Vital signs: Vitals:   10/26/17 1330 10/26/17 1400  BP: 128/79 132/84  Pulse: 72 78  Resp:    Temp:    SpO2: 100% 100%     Physical Exam  Constitutional: He is oriented to person, place, and time. He appears well-developed and well-nourished. No distress.  HENT:  Head: Normocephalic and atraumatic.  Mouth/Throat: Oropharynx is clear and moist. No oropharyngeal exudate.  Eyes: EOM are normal. No scleral icterus.  Neck: Normal range of motion. Neck supple.  Cardiovascular: Normal rate and regular rhythm.  Pulmonary/Chest: Effort normal and breath sounds normal. No respiratory distress.  Abdominal: Soft. Bowel sounds are normal. He exhibits no distension. There is no tenderness. There is no rebound and no guarding.  Musculoskeletal: Normal range of motion. He exhibits no edema.  Neurological: He is alert and oriented to person, place, and time.  Skin: Skin is warm. No erythema.  Psychiatric: He has a normal mood and affect. Judgment normal.  Vitals reviewed.  GI:  Lab Results: Recent Labs    10/26/17 1111  WBC 6.7  HGB 15.6  HCT 45.8  PLT 259   BMET Recent Labs    10/26/17 1111  NA 142  K 4.3  CL 103  CO2 28  GLUCOSE 90  BUN <5*  CREATININE 1.06  CALCIUM 9.5   LFT Recent Labs    10/26/17 1111  PROT 6.7  ALBUMIN 3.7  AST 17  ALT 19  ALKPHOS 94  BILITOT 0.8   PT/INR No results for input(s): LABPROT, INR in the last 72 hours.   Studies/Results: No results  found.  Impression/Plan: - abdominal pain in patient with known Crohn's disease with enteroenteric fistula. He missed his appointment with Dr. Michail Sermon earlier this month.  not been on any steroids for last few months.last dose of Cimzia 2 weeks ago.  - Nausea and vomiting. Improving. - GERD   Recommendations -------------------------- - Patient is already feeling better. Blood work normal including CBC, CMP and lipase. Physical exam benign. I did not appreciate any tenderness on the physical exam today.( may be because patient has received narcotic pain medication) - recommend abdominal x-ray to rule out any concerning findings such as small bowel obstruction  - If X-ray negative, okay to discharge home from GI standpoint with prednisone taper (40 mg per day for 1 week followed by 30 mg per day for another week, followed by 30 mg per day for another week and then 10 mg per day.) - Also start Protonix 40 mg once a day for acid reflux. - he was advised to contact GI office to arrange for follow-up in next 2-4 weeks. - if x-ray shows evidence of small bowel obstruction, he will need admission and possibly CT enterography for further evaluation. - recommendations were discussed with the ER physician     LOS: 0 days   Otis Brace  MD, FACP 10/26/2017, 2:30 PM  Contact #  [phone]

## 2017-10-26 NOTE — ED Notes (Signed)
Patient transported to X-ray 

## 2017-10-26 NOTE — ED Triage Notes (Signed)
Pt from home with a c/c of generalized abdominal pain for three days. Pt has a hx of Crohns diease and reports that it feels like a flare up. He has mostly been on a liquid diet for the past 3 days. Complaints of N/V/D. Two episodes of vomiting. Diarrhea has been constant. No blood noted in vomit or stool. He ran out of his steroids. Tenderness to palpation of his abdomen.

## 2017-10-26 NOTE — Progress Notes (Signed)
Admitted from ED, alert and oriented, not in any distress. IV started, MD paged for pain management. Oriented to unit and satff. Endorsed to incoming nurse. Admission nurse made aware of the admission.

## 2017-10-26 NOTE — Progress Notes (Signed)
-   x-ray showed evidence of partial small bowel obstruction. - recommend admission for further evaluation. - CT enterography ordered. - start Solu-Medrol 60 mg every 6 hours for now. - Okay to have clear liquid diet after CT scan. - GI will follow  Otis Brace MD, [place] 10/26/2017, 3:23 PM  Contact #  [phone]

## 2017-10-26 NOTE — ED Notes (Signed)
Report attempted 

## 2017-10-26 NOTE — H&P (Signed)
HISTORY AND PHYSICAL       PATIENT DETAILS Name: Jeffery Mcintosh Age: 37 y.o. Sex: male Date of Birth: 06-27-1980 Admit Date: 10/26/2017 NHA:[id number], [facility]   Patient coming from: Home   CHIEF COMPLAINT:  Abdominal pain, nausea and vomiting for the past 3 days.  HPI: Jeffery Mcintosh is a 37 y.o. male with medical history significant of Crohn's disease-on chronic prednisone and Cimzia injections monthly-presenting to the hospital with the above-noted complaints.  Per patient for the past 3 days-he started to have abdominal pain that is mostly in his upper/right abdomen that mostly occurs after eating.  He also feels that his abdomen is more distended than usual.  Pain is gradually worsened-to the extent that it is 8/10-day of presentation to the hospital.  He has had numerous episodes of vomiting and some nausea.  He has no worsening of his chronic diarrhea (loose stools a day).  He does acknowledge pain around his perianal area but no blood or mucus in his stools.  She denies any fever, chest pain, shortness of breath.  ED Course: Found to have small bowel obstruction-GI consulted-recommendations were to start steroids and admit to the hospitalist service.  Note: Lives at: Home Mobility: Independent Chronic Indwelling Foley:no   REVIEW OF SYSTEMS:  Constitutional:   No  weight loss, night sweats,  Fevers, chills, fatigue.  HEENT:    No headaches, Dysphagia,Tooth/dental problems,Sore throat,  No sneezing, itching, ear ache, nasal congestion, post nasal drip  Cardio-vascular: No chest pain,Orthopnea, PND,lower extremity edema, anasarca, palpitations  GI:  No heartburn,melena or hematochezia  Resp: No shortness of breath, cough, hemoptysis,plueritic chest pain.   Skin:  No rash or lesions.  GU:  No dysuria, change in color of urine, no urgency or frequency.  No flank pain.  Musculoskeletal: No joint pain or swelling.   No decreased range of motion.  No back pain.  Endocrine: No heat intolerance, no cold intolerance, no polyuria, no polydipsia  Psych: No change in mood or affect. No depression or anxiety.  No memory loss.   ALLERGIES:  No Known Allergies  PAST MEDICAL HISTORY: Past Medical History:  Diagnosis Date  . Anxiety   . Arthritis   . Bipolar affective ([place])   . Chronic headaches   . Crohn's disease ([place]) history of  . Depression   . GERD (gastroesophageal reflux disease)   . Hypertension   . Schizophrenic disorder ([place])     PAST SURGICAL HISTORY: Past Surgical History:  Procedure Laterality Date  . NO PAST SURGERIES      MEDICATIONS AT HOME: Prior to Admission medications   Medication Sig Start Date End Date Taking? Authorizing Provider  CIMZIA PREFILLED 2 X 200 MG/ML KIT Inject 200 mg into the skin every 30 (thirty) days. 07/21/17  Yes [provider]  lamoTRIgine (LAMICTAL) 25 MG tablet Take 25 mg by mouth 2 (two) times daily.    Yes [provider]  omeprazole (PRILOSEC) 40 MG capsule Take 1 capsule (40 mg total) by mouth daily. Patient taking differently: Take 40 mg by mouth daily as needed (heartburn).  10/17/16  Yes Nita Sells, MD  predniSONE (DELTASONE) 10 MG tablet Take 1 tablet (10 mg total) by mouth daily with breakfast. 07/24/17  Yes Nat Christen, MD  TRAZODONE HCL PO Take 1 tablet by mouth at bedtime.   Yes [provider]    FAMILY HISTORY: Family History  Problem Relation Age of Onset  .  Hypertension Mother   . Heart attack Mother   . Stroke Mother   . Alcohol abuse Father   . Mental illness Father   . Multiple sclerosis Father   . Arthritis Maternal Grandmother   . Hypertension Maternal Grandmother   . Heart disease Maternal Grandmother   . Kidney disease Maternal Grandmother   . Diabetes Maternal Grandmother   . Cancer Maternal Grandmother        great, type unknown  . Hyperlipidemia Maternal Grandfather   . Colon  polyps Maternal Grandfather   . Stroke Paternal Grandmother   . Ulcerative colitis Maternal Aunt     SOCIAL HISTORY:  reports that he has been smoking cigars. He has never used smokeless tobacco. He reports that he does not drink alcohol or use drugs.  PHYSICAL EXAM: Blood pressure 132/84, pulse 91, temperature 98 F (36.7 C), temperature source Oral, resp. rate 17, height 5' 5"  (1.651 m), weight 76.2 kg, SpO2 97 %.  General appearance :Awake, alert, not in any distress. Speech Clear. Not toxic Looking Eyes:, pupils equally reactive to light and accomodation,no scleral icterus.Pink conjunctiva HEENT: Atraumatic and Normocephalic Neck: supple, no JVD. No cervical lymphadenopathy. No thyromegaly Resp:Good air entry bilaterally, no added sounds  CVS: S1 S2 regular, no murmurs.  GI: Bowel sounds present, mildly tender in the epigastric/periumbilical and right mid abdominal area.  No peritoneal signs.  Abdomen appears to be slightly distended.   Extremities: B/L Lower Ext shows no edema, both legs are warm to touch Neurology:  speech clear,Non focal, sensation is grossly intact. Psychiatric: Normal judgment and insight. Alert and oriented x 3. Normal mood. Musculoskeletal:gait appears to be normal.No digital cyanosis Skin:No Rash, warm and dry Wounds:N/A  LABS ON ADMISSION:  I have personally reviewed following labs and imaging studies  CBC: Recent Labs  Lab 10/26/17 1111  WBC 6.7  HGB 15.6  HCT 45.8  MCV 92.2  PLT 177    Basic Metabolic Panel: Recent Labs  Lab 10/26/17 1111  NA 142  K 4.3  CL 103  CO2 28  GLUCOSE 90  BUN <5*  CREATININE 1.06  CALCIUM 9.5    GFR: Estimated Creatinine Clearance: 92.7 mL/min (by C-G formula based on SCr of 1.06 mg/dL).  Liver Function Tests: Recent Labs  Lab 10/26/17 1111  AST 17  ALT 19  ALKPHOS 94  BILITOT 0.8  PROT 6.7  ALBUMIN 3.7   Recent Labs  Lab 10/26/17 1111  LIPASE 30   No results for input(s): AMMONIA in  the last 168 hours.  Coagulation Profile: No results for input(s): INR, PROTIME in the last 168 hours.  Cardiac Enzymes: No results for input(s): CKTOTAL, CKMB, CKMBINDEX, TROPONINI in the last 168 hours.  BNP (last 3 results) No results for input(s): PROBNP in the last 8760 hours.  HbA1C: No results for input(s): HGBA1C in the last 72 hours.  CBG: No results for input(s): GLUCAP in the last 168 hours.  Lipid Profile: No results for input(s): CHOL, HDL, LDLCALC, TRIG, CHOLHDL, LDLDIRECT in the last 72 hours.  Thyroid Function Tests: No results for input(s): TSH, T4TOTAL, FREET4, T3FREE, THYROIDAB in the last 72 hours.  Anemia Panel: No results for input(s): VITAMINB12, FOLATE, FERRITIN, TIBC, IRON, RETICCTPCT in the last 72 hours.  Urine analysis:    Component Value Date/Time   COLORURINE YELLOW 10/26/2017 [place] 10/26/2017 1134   LABSPEC 1.015 10/26/2017 1134   PHURINE 5.0 10/26/2017 1134   GLUCOSEU NEGATIVE 10/26/2017 1134  GLUCOSEU NEGATIVE 04/18/2013 [place] 10/26/2017 [place] 10/26/2017 1134   Divide 10/26/2017 1134   PROTEINUR NEGATIVE 10/26/2017 1134   UROBILINOGEN 1.0 11/16/2014 1719   NITRITE NEGATIVE 10/26/2017 1134   LEUKOCYTESUR NEGATIVE 10/26/2017 1134    Sepsis Labs: Lactic Acid, Venous    Component Value Date/Time   LATICACIDVEN 1.86 11/16/2014 1603     Microbiology: No results found for this or any previous visit (from the past 240 hour(s)).    RADIOLOGIC STUDIES ON ADMISSION: Ct Entero Abd/pelvis W Contast  Result Date: 10/26/2017 CLINICAL DATA:  37 year old male with history of generalized abdominal pain for the past 3 days. History of Crohn's disease. Nausea, vomiting and diarrhea. EXAM: CT ABDOMEN AND PELVIS WITH CONTRAST (ENTEROGRAPHY) TECHNIQUE: Multidetector CT of the abdomen and pelvis during bolus administration of intravenous contrast. Negative oral contrast was  given. CONTRAST:  118m OMNIPAQUE IOHEXOL 300 MG/ML  SOLN COMPARISON:  CT the abdomen and pelvis 07/24/2017. FINDINGS: Lower chest:  Unremarkable. Hepatobiliary: No cystic or solid hepatic lesions. No intra or extrahepatic biliary ductal dilatation. Gallbladder is normal in appearance. Pancreas: No pancreatic mass. No pancreatic ductal dilatation. No pancreatic or peripancreatic fluid or inflammatory changes. Spleen: Unremarkable. Adrenals/Urinary Tract: Bilateral kidneys and bilateral adrenal glands are normal in appearance. No hydroureteronephrosis. Urinary bladder is normal in appearance. Stomach/Bowel: Normal appearance of the stomach. No pathologic dilatation of small bowel or colon. Status post appendectomy. In the terminal ileum there is an area of mural thickening and mucosal hyperenhancement best appreciated on axial images 5154 of series 3. Minimal surrounding soft tissue stranding suggesting mild active disease. Several adjacent loops of bowel appear tethered in the right lower quadrant. Vascular/Lymphatic: No significant atherosclerotic disease, aneurysm or dissection noted in the abdominal or pelvic vasculature. No lymphadenopathy noted in the abdomen or pelvis. Reproductive: Prostate gland and seminal vesicles are unremarkable in appearance. Other: No significant volume of ascites.  No pneumoperitoneum. Musculoskeletal: There are no aggressive appearing lytic or blastic lesions noted in the visualized portions of the skeleton. IMPRESSION: 1. There are findings suggestive of active Crohn's disease involving the terminal ileum. In addition to some mild inflammatory changes in this region, there appear to be several loops of small bowel which appear tethered to this region in the right lower quadrant, presumably from adhesions in the mesentery. At this time, this is not associated with bowel obstruction. However, the possibility of enteroenteric fistulae is not entirely excluded. No intra-abdominal  abscess noted at this time. Electronically Signed   By: DVinnie LangtonM.D.   On: 10/26/2017 16:52   Dg Abdomen Acute W/chest  Result Date: 10/26/2017 CLINICAL DATA:  Abdominal pain EXAM: DG ABDOMEN ACUTE W/ 1V CHEST COMPARISON:  07/24/2017 FINDINGS: There are clustered dilated small bowel loops with fluid levels in the central abdomen. Less colonic gas. No evidence of pneumoperitoneum or pneumatosis. Normal heart size and mediastinal contours. No acute infiltrate or edema. No effusion or pneumothorax. No acute osseous findings. IMPRESSION: Findings of partial small bowel obstruction. Electronically Signed   By: JMonte FantasiaM.D.   On: 10/26/2017 15:16     EKG:  Not done  ASSESSMENT AND PLAN: Crohn's disease flare causing partial small bowel obstruction: Not actively vomiting-do not think he needs an NG tube at this point-we will allow clear liquids-start IV steroids.  IV fluids, antiemetics and as needed narcotics will be prescribed.  GI already following.  Will admit and monitor closely.  If he does develop worsening  vomiting/pain-may need an NG tube at some point.  Since he does not have any worsening of his baseline diarrhea-do not think he requires stool studies at this point.  If he improves-suspect we can taper steroids and advance diet.  We will follow closely.   Bipolar disorder: Continue Lamictal  Further plan will depend as patient's clinical course evolves and further radiologic and laboratory data become available. Patient will be monitored closely.  Above noted plan was discussed with patient face to face at bedside, he was in agreement.   CONSULTS: GI  DVT Prophylaxis: Prophylactic Lovenox   Code Status: Full Code  Disposition Plan:  Discharge back home  in 2-3 days, depending on clinical course  Admission status: Inpatient going to medical floor   The medical decision making on this patient was of high complexity and the patient is at high risk for clinical  deterioration, therefore this is a level 3 visit.  Total time spent  55 minutes.Greater than 50% of this time was spent in counseling, explanation of diagnosis, planning of further management, and coordination of care.  Oren Binet [facility] Pager [phone]  If 7PM-7AM, please contact night-coverage [email or web address] Password [facility] 10/26/2017, 5:04 PM

## 2017-10-26 NOTE — ED Notes (Signed)
Advised by charge per [employer] call report 6pm

## 2017-10-26 NOTE — ED Provider Notes (Signed)
[facility] EMERGENCY DEPARTMENT Provider Note   CSN: [id number] Arrival date & time: 10/26/17  1008  History   Chief Complaint Chief Complaint  Patient presents with  . Abdominal Pain    HPI Jeffery Mcintosh is a 37 y.o. male with a hx of tobacco abuse, Crohn's disease currently on Cimzia w/ known fistula, GERD, and schizophrenia who presents to the ED with complaints of abdominal pain for past 3.5 days. Patient states he has had generalized abdominal discomfort, epigastric pain is burning in nature with a lower abdominal bloating sensation. Pain is constant, progressively worsening, currently an 8/10 in severity. It is worse with attempts to eat, patient has switched to clear liquid diet without much change. He has had associated 2 episodes of non bloody emesis, as well as 2-3 daily episodes of non bloody diarrhea (last episode this AM). He states that this feels similar to prior Crohn's flares which have required hospitalizations. He states he has been taking his Cimzia as prescribed. He had some left over prednisone from a prior flare, took 20 mg day 1 and 20 mg day 2 without much change and then subsequently ran out. He is followed by gastroenterologist Dr. Michail Sermon- he spoke with his offfice and was instructed to come to the ED. Denies fever, chills, dyspnea, dysuria, testicular pain/swelling, melena, or hematochezia. Denies recent foreign travel or abx use.  HPI  Past Medical History:  Diagnosis Date  . Anxiety   . Arthritis   . Bipolar affective ([place])   . Chronic headaches   . Crohn's disease ([place]) history of  . Depression   . GERD (gastroesophageal reflux disease)   . Hypertension   . Schizophrenic disorder [facility])     Patient Active Problem List   Diagnosis Date Noted  . Crohn's colitis, unspecified complication ([place]) 69/62/9528  . Epigastric burning sensation 01/03/2015  . Numbness and tingling in left arm 11/15/2014  . Exacerbation of Crohn's disease  ([place]) 10/21/2014  . Crohn's disease ([place]) 10/21/2014  . Diarrhea 10/21/2014  . Abdominal pain   . Gout 04/18/2013  . Cubital tunnel syndrome 04/18/2013  . Abnormal urinalysis 04/18/2013    Past Surgical History:  Procedure Laterality Date  . NO PAST SURGERIES          Home Medications    Prior to Admission medications   Medication Sig Start Date End Date Taking? Authorizing Provider  CIMZIA PREFILLED 2 X 200 MG/ML KIT Inject 200 mg into the skin every 30 (thirty) days. 07/21/17   [provider]  dicyclomine (BENTYL) 10 MG capsule Take 1 capsule (10 mg total) by mouth every 6 (six) hours as needed for spasms (cramping). Patient not taking: Reported on 10/15/2016 10/23/14   Rai, Vernelle Emerald, MD  gabapentin (NEURONTIN) 300 MG capsule Take 600 mg by mouth 3 (three) times daily.    [provider]  HYDROcodone-acetaminophen (NORCO/VICODIN) 5-325 MG tablet Take 1-2 tablets by mouth every 6 (six) hours as needed. Patient not taking: Reported on 07/24/2017 01/01/17   Montine Circle, PA-C  omeprazole (PRILOSEC) 40 MG capsule Take 1 capsule (40 mg total) by mouth daily. Patient taking differently: Take 40 mg by mouth daily as needed (heartburn).  10/17/16   Nita Sells, MD  ondansetron (ZOFRAN ODT) 4 MG disintegrating tablet Take 1 tablet (4 mg total) by mouth every 8 (eight) hours as needed for nausea or vomiting. Patient not taking: Reported on 07/24/2017 01/01/17   Montine Circle, PA-C  ondansetron (ZOFRAN) 4 MG tablet Take  1 tablet (4 mg total) by mouth every 6 (six) hours. 07/24/17   Nat Christen, MD  oxyCODONE-acetaminophen (PERCOCET) 5-325 MG tablet Take 1 tablet by mouth every 4 (four) hours as needed. 07/24/17   Nat Christen, MD  predniSONE (DELTASONE) 10 MG tablet Take 1 tablet (10 mg total) by mouth daily with breakfast. 07/24/17   Nat Christen, MD  Probiotic Product (PROBIOTIC DAILY PO) Take 1 capsule by mouth daily.    [provider]  traMADol  (ULTRAM) 50 MG tablet Take 1 tablet (50 mg total) by mouth every 6 (six) hours as needed. Patient not taking: Reported on 10/15/2016 01/16/15   Ladene Artist, MD    Family History Family History  Problem Relation Age of Onset  . Hypertension Mother   . Heart attack Mother   . Stroke Mother   . Alcohol abuse Father   . Mental illness Father   . Multiple sclerosis Father   . Arthritis Maternal Grandmother   . Hypertension Maternal Grandmother   . Heart disease Maternal Grandmother   . Kidney disease Maternal Grandmother   . Diabetes Maternal Grandmother   . Cancer Maternal Grandmother        great, type unknown  . Hyperlipidemia Maternal Grandfather   . Colon polyps Maternal Grandfather   . Stroke Paternal Grandmother   . Ulcerative colitis Maternal Aunt     Social History Social History   Tobacco Use  . Smoking status: Current Every Day Smoker    Types: Cigars  . Smokeless tobacco: Never Used  Substance Use Topics  . Alcohol use: No    Alcohol/week: 0.0 standard drinks  . Drug use: No     Allergies   Patient has no known allergies.   Review of Systems Review of Systems  Constitutional: Negative for chills and fever.  Respiratory: Negative for shortness of breath.   Gastrointestinal: Positive for abdominal pain, diarrhea, nausea and vomiting. Negative for anal bleeding, blood in stool and constipation.  Genitourinary: Negative for dysuria, scrotal swelling and testicular pain.  All other systems reviewed and are negative.  Physical Exam Updated Vital Signs BP (!) 132/94 (BP Location: Right Arm)   Pulse 85   Temp 98 F (36.7 C) (Oral)   Resp 17   Ht 5' 5"  (1.651 m)   Wt 76.2 kg   SpO2 100%   BMI 27.96 kg/m   Physical Exam  Constitutional: He appears well-developed and well-nourished.  Non-toxic appearance. No distress.  HENT:  Head: Normocephalic and atraumatic.  Eyes: Conjunctivae are normal. Right eye exhibits no discharge. Left eye exhibits no  discharge.  Neck: Neck supple.  Cardiovascular: Normal rate and regular rhythm.  Pulmonary/Chest: Effort normal and breath sounds normal. No respiratory distress. He has no wheezes. He has no rhonchi. He has no rales.  Respiration even and unlabored  Abdominal: Soft. Bowel sounds are normal. He exhibits no distension. There is generalized tenderness (more prominent to RUQ/RLQ and central abdomen than to L side). There is no rigidity, no rebound, no guarding and no CVA tenderness.  Neurological: He is alert.  Clear speech.   Skin: Skin is warm and dry. No rash noted.  Psychiatric: He has a normal mood and affect. His behavior is normal.  Nursing note and vitals reviewed.   ED Treatments / Results  Labs Results for orders placed or performed during the hospital encounter of 10/26/17  Lipase, blood  Result Value Ref Range   Lipase 30 11 - 51 U/L  Comprehensive  metabolic panel  Result Value Ref Range   Sodium 142 135 - 145 mmol/L   Potassium 4.3 3.5 - 5.1 mmol/L   Chloride 103 98 - 111 mmol/L   CO2 28 22 - 32 mmol/L   Glucose, Bld 90 70 - 99 mg/dL   BUN <5 (L) 6 - 20 mg/dL   Creatinine, Ser 1.06 0.61 - 1.24 mg/dL   Calcium 9.5 8.9 - 10.3 mg/dL   Total Protein 6.7 6.5 - 8.1 g/dL   Albumin 3.7 3.5 - 5.0 g/dL   AST 17 15 - 41 U/L   ALT 19 0 - 44 U/L   Alkaline Phosphatase 94 38 - 126 U/L   Total Bilirubin 0.8 0.3 - 1.2 mg/dL   GFR calc non Af Amer >60 >60 mL/min   GFR calc Af Amer >60 >60 mL/min   Anion gap 11 5 - 15  CBC  Result Value Ref Range   WBC 6.7 4.0 - 10.5 K/uL   RBC 4.97 4.22 - 5.81 MIL/uL   Hemoglobin 15.6 13.0 - 17.0 g/dL   HCT 45.8 39.0 - 52.0 %   MCV 92.2 78.0 - 100.0 fL   MCH 31.4 26.0 - 34.0 pg   MCHC 34.1 30.0 - 36.0 g/dL   RDW 13.2 11.5 - 15.5 %   Platelets 259 150 - 400 K/uL  Urinalysis, Routine w reflex microscopic  Result Value Ref Range   Color, Urine YELLOW YELLOW   APPearance CLEAR CLEAR   Specific Gravity, Urine 1.015 1.005 - 1.030   pH 5.0 5.0  - 8.0   Glucose, UA NEGATIVE NEGATIVE mg/dL   Hgb urine dipstick NEGATIVE NEGATIVE   Bilirubin Urine NEGATIVE NEGATIVE   Ketones, ur NEGATIVE NEGATIVE mg/dL   Protein, ur NEGATIVE NEGATIVE mg/dL   Nitrite NEGATIVE NEGATIVE   Leukocytes, UA NEGATIVE NEGATIVE   No results found.  EKG None  Radiology Dg Abdomen Acute W/chest  Result Date: 10/26/2017 CLINICAL DATA:  Abdominal pain EXAM: DG ABDOMEN ACUTE W/ 1V CHEST COMPARISON:  07/24/2017 FINDINGS: There are clustered dilated small bowel loops with fluid levels in the central abdomen. Less colonic gas. No evidence of pneumoperitoneum or pneumatosis. Normal heart size and mediastinal contours. No acute infiltrate or edema. No effusion or pneumothorax. No acute osseous findings. IMPRESSION: Findings of partial small bowel obstruction. Electronically Signed   By: Monte Fantasia M.D.   On: 10/26/2017 15:16    Procedures Procedures (including critical care time)  Medications Ordered in ED Medications  sodium chloride 0.9 % bolus 1,000 mL (1,000 mLs Intravenous New Bag/Given 10/26/17 1304)  HYDROmorphone (DILAUDID) injection 1 mg (1 mg Intravenous Given 10/26/17 1305)  ondansetron (ZOFRAN) injection 4 mg (4 mg Intravenous Given 10/26/17 1305)     Initial Impression / Assessment and Plan / ED Course  I have reviewed the triage vital signs and the nursing notes.  Pertinent labs & imaging results that were available during my care of the patient were reviewed by me and considered in my medical decision making (see chart for details).   Patient presents to the emergency department with abdominal pain with associated N/V/D which he feels is consistent with a Crohn's flare.  Patient nontoxic-appearing, resting comfortably, vitals without significant abnormalities. On exam patient has generalized tenderness to the abdomen that seems to be worse centrally and to the RUQ/RLQ, no peritoneal signs. Analgesics, anti-emetics, and fluids have been ordered.    His labs have been reviewed and are grossly unremarkable. No leukocytosis, no anemia, no significant  electrolyte disturbance. LFTs, lipase, and creatinine are WNL. Given patient with hx of Crohn's disease with reportedly similar sxs to prior Crohn's flare requiring admissions will consult his gastroenterology team to discuss necessity of imaging. Findings and plan of care discussed with supervising physician Dr. Laverta [place] who is in agreement.   13:45: RE-EVAL: Patient with minimal change in discomfort after initial dose of Dilaudid, states improved from 8/10 in severity to 7/10. Pending call back from GI, will re-dose.   14:20: CONSULT: Discussed case with gastroenterologist Dr. Alessandra Bevels- without peritoneal signs would hold off on CT, would obtain abdominal x-ray, will consult.   14:50: Dr. Alessandra Bevels present in ED, has evaluated patient, if can get patient's pain under control without concerning findings on X-ray- feel patient can be discharged with Prednisone Taper (50m x 1 week, 30 mg x 1 week, 265mx 1 week, and 10 mg x 1 week) as well as Protonix 40 mg daily in the AM.   15:20: Patient DG abdomen series with partial small bowel obstruction, Dr. BrAlessandra Bevelsas ordered CT enterography for further evaluation, patient will require admission.   16:15: RE-EVAL: Patient pain improved, pending CT, will consult hospitalist service for unassigned admission.   16:35:CONSULT: Discussed case with hospitalist Dr. GhSloan Leiteraccepts admission.    Final Clinical Impressions(s) / ED Diagnoses   Final diagnoses:  Crohn's disease with fistula, unspecified gastrointestinal tract location ([facility]   ED Discharge Orders    None       PeAmaryllis DykePA-C 10/26/17 1641    LoMargette FastMD 10/27/17 [phone]

## 2017-10-27 ENCOUNTER — Other Ambulatory Visit: Payer: Self-pay

## 2017-10-27 DIAGNOSIS — K50012 Crohn's disease of small intestine with intestinal obstruction: Secondary | ICD-10-CM

## 2017-10-27 LAB — BASIC METABOLIC PANEL
ANION GAP: 11 (ref 5–15)
BUN: <5 mg/dL — ABNORMAL LOW (ref 6–20)
CALCIUM: 9.3 mg/dL (ref 8.9–10.3)
CO2: 27 mmol/L (ref 22–32)
Chloride: 104 mmol/L (ref 98–111)
Creatinine, Ser: 0.91 mg/dL (ref 0.61–1.24)
Glucose, Bld: 133 mg/dL — ABNORMAL HIGH (ref 70–99)
POTASSIUM: 4.1 mmol/L (ref 3.5–5.1)
Sodium: 142 mmol/L (ref 135–145)

## 2017-10-27 LAB — CBC
HCT: 42.1 % (ref 39.0–52.0)
HEMOGLOBIN: 14.6 g/dL (ref 13.0–17.0)
MCH: 31.1 pg (ref 26.0–34.0)
MCHC: 34.7 g/dL (ref 30.0–36.0)
MCV: 89.8 fL (ref 78.0–100.0)
Platelets: 286 10*3/uL (ref 150–400)
RBC: 4.69 MIL/uL (ref 4.22–5.81)
RDW: 13 % (ref 11.5–15.5)
WBC: 10.5 10*3/uL (ref 4.0–10.5)

## 2017-10-27 LAB — HIV ANTIBODY (ROUTINE TESTING W REFLEX): HIV SCREEN 4TH GENERATION: NONREACTIVE

## 2017-10-27 MED ORDER — PREDNISONE 10 MG PO TABS: ORAL_TABLET | ORAL | 0 refills | Status: DC

## 2017-10-27 MED ORDER — OXYCODONE-ACETAMINOPHEN 5-325 MG PO TABS: 1.0000 | ORAL_TABLET | Freq: Four times a day (QID) | ORAL | 0 refills | Status: DC | PRN

## 2017-10-27 NOTE — Discharge Instructions (Signed)
Crohn Disease Crohn disease is a long-lasting (chronic) disease that affects your gastrointestinal (GI) tract. It often causes irritation and swelling (inflammation) in your small intestine and the beginning of your large intestine. However, it can affect any part of your GI tract. Crohn disease is part of a group of illnesses that are known as inflammatory bowel disease (IBD). Crohn disease may start slowly and get worse over time. Symptoms may come and go. They may also disappear for months or even years at a time (remission). What are the causes? The exact cause of Crohn disease is not known. It may be a response that causes your body's defense system (immune system) to mistakenly attack healthy cells and tissues (autoimmune response). Your genes and your environment may also play a role. What increases the risk? You may be at greater risk for Crohn disease if you:  Have other family members with Crohn disease or another IBD.  Use any tobacco products, including cigarettes, chewing tobacco, or electronic cigarettes.  Are in your 71s.  Have [place] European ancestry.  What are the signs or symptoms? The main signs and symptoms of Crohn disease involve your GI tract. These include:  Diarrhea.  Rectal bleeding.  An urgent need to move your bowels.  The feeling that you are not finished having a bowel movement.  Abdominal pain or cramping.  Constipation.  General signs and symptoms of Crohn disease may also include:  Unexplained weight loss.  Fatigue.  Fever.  Nausea.  Loss of appetite.  Joint pain  Changes in vision.  Red bumps on your skin.  How is this diagnosed? Your health care provider may suspect Crohn disease based on your symptoms and your medical history. Your health care provider will do a physical exam. You may need to see a health care provider who specializes in diseases of the digestive tract (gastroenterologist). You may also have tests to help your  health care providers make a diagnosis. These may include:  Blood tests.  Stool sample tests.  Imaging tests, such as X-rays and CT scans.  Tests to examine the inside of your intestines using a long, flexible tube that has a light and a camera on the end (endoscopy or colonoscopy).  A procedure to take tissue samples from inside your bowel (biopsy) to be examined under a microscope.  How is this treated? There is no cure for Crohn disease. Treatment will focus on managing your symptoms. Crohn disease affects each person differently. Your treatment may include:  Resting your bowels. Drinking only clear liquids or getting nutrition through an IV for a period of time gives your bowels a chance to heal because they are not passing stools.  Medicines. These may be used alone or in combination (combination therapy). These may include antibiotic medicines. You may be given medicines that help to: ? Reduce inflammation. ? Control your immune system activity. ? Fight infections. ? Relieve cramps and prevent diarrhea. ? Control your pain.  Surgery. You may need surgery if: ? Medicines and other treatments are no longer working. ? You develop complications from severe Crohn disease. ? A section of your intestine becomes so damaged that it needs to be removed.  Follow these instructions at home:  Take medicines only as directed by your health care provider.  If you were prescribed an antibiotic medicine, finish it all even if you start to feel better.  Keep all follow-up visits as directed by your health care provider. This is important.  Talk with your  health care provider about changing your diet. This may help your symptoms. Your health care provide may recommend changes, such as: ? Drinking more fluids. ? Avoiding milk and other foods that contain lactose. ? Eating a low-fat diet. ? Avoiding high-fiber foods, such as popcorn and nuts. ? Avoiding carbonated beverages, such as  soda. ? Eating smaller meals more often rather than eating large meals. ? Keeping a food diary to identify foods that make your symptoms better or worse.  Do not use any tobacco products, including cigarettes, chewing tobacco, or electronic cigarettes. If you need help quitting, ask your health care provider.  Limit alcohol intake to no more than 1 drink per day for nonpregnant women and 2 drinks per day for men. One drink equals 12 ounces of beer, 5 ounces of wine, or 1 ounces of hard liquor.  Exercise daily or as directed by your health care provider. Contact a health care provider if:  You have diarrhea, abdominal cramps, and other gastrointestinal problems that are present almost all of the time.  Your symptoms do not improve with treatment.  You continue to lose weight.  You develop a rash or sores on your skin.  You develop eye problems.  You have a fever.  Your symptoms get worse.  You develop new symptoms. Get help right away if:  You have bloody diarrhea.  You develop severe abdominal pain.  You cannot pass stools. This information is not intended to replace advice given to you by your health care provider. Make sure you discuss any questions you have with your health care provider. Document Released: 10/30/2004 Document Revised: 05/31/2015 Document Reviewed: 09/07/2013 Elsevier Interactive Patient Education  2018 [employer].

## 2017-10-27 NOTE — Progress Notes (Signed)
[facility] Gastroenterology Progress Note  Jeffery Mcintosh 37 y.o. 1981-01-01  CC: Crohn's exacerbation   Subjective: He is feeling significant improvement in symptoms.  Abdominal pain is resolved.  Currently having formed stools.  Denied nausea vomiting.  Afebrile.    Objective: Vital signs in last 24 hours: Vitals:   10/26/17 2128 10/27/17 0548  BP: 122/63 119/60  Pulse: 86 83  Resp: 18 17  Temp: 97.9 F (36.6 C) 98.2 F (36.8 C)  SpO2: 97% 99%    Physical Exam:  General:  Alert, cooperative, no distress, appears stated age  Head:  Normocephalic, without obvious abnormality, atraumatic  Eyes:  , EOM's intact,   Lungs:   Clear to auscultation bilaterally, respirations unlabored  Heart:  Regular rate and rhythm, S1, S2 normal  Abdomen:   Soft, non-tender, bowel sounds active all four quadrants,  no masses,   Extremities: Extremities normal, atraumatic, no  edema       Lab Results: Recent Labs    10/26/17 1111 10/26/17 1846 10/27/17 0624  NA 142  --  142  K 4.3  --  4.1  CL 103  --  104  CO2 28  --  27  GLUCOSE 90  --  133*  BUN <5*  --  <5*  CREATININE 1.06 1.00 0.91  CALCIUM 9.5  --  9.3   Recent Labs    10/26/17 1111  AST 17  ALT 19  ALKPHOS 94  BILITOT 0.8  PROT 6.7  ALBUMIN 3.7   Recent Labs    10/26/17 1846 10/27/17 0624  WBC 9.6 10.5  HGB 15.0 14.6  HCT 43.5 42.1  MCV 90.4 89.8  PLT 251 286   No results for input(s): LABPROT, INR in the last 72 hours.    Assessment/Plan: -Crohn's exacerbation.  Improving. - Crohn's disease with enteroenteric fistula. He missed his appointment with Dr. Michail Sermon earlier this month. .  - Nausea and vomiting.  Resolved - GERD -resolved  Recommendations -------------------------- -CT enterography yesterday showed no evidence of small bowel obstruction.  Showed mild active disease in the terminal ileum.  Several loops of small bowel appeared tethered together and underlying fistula cannot be ruled  out.  -Patient is asymptomatic now. -Advance diet to soft diet.  If tolerating diet, okay to discharge home later today from GI standpoint. -  prednisone taper on discharge (40 mg per day for 1 week followed by 30 mg per day for another week, followed by 30 mg per day for another week and then 10 mg per day.) -Continue Protonix - he was advised to contact GI office to arrange for follow-up in next 2-4 weeks. -GI will sign off.  Call us back if needed   Otis Brace MD, [place] 10/27/2017, 8:52 AM  Contact #  [phone]

## 2017-10-27 NOTE — Discharge Summary (Signed)
[facility]  Physician Discharge Summary   Patient ID: Jeffery Mcintosh MRN: [id number] DOB/AGE: 03/08/1980 37 y.o.  Admit date: 10/26/2017 Discharge date: 10/27/2017  PCP: [facility]  DISCHARGE DIAGNOSES:  Crohn's disease with exacerbation  RECOMMENDATIONS FOR OUTPATIENT FOLLOW UP: 1. Outpatient follow-up with gastroenterology   DISCHARGE CONDITION: fair  Diet recommendation: Soft diet as tolerated  Filed Weights   10/26/17 1052 10/26/17 1834  Weight: 76.2 kg 78 kg    INITIAL HISTORY: 37 y.o. male with medical history significant of Crohn's disease-on chronic prednisone and Cimzia injections monthly-presenting to the hospital with the above-noted complaints.  Per patient for the past 3 days-he started to have abdominal pain that is mostly in his upper/right abdomen that mostly occurs after eating.  He also feels that his abdomen is more distended than usual.  Pain is gradually worsened-to the extent that it is 8/10-day of presentation to the hospital.  He has had numerous episodes of vomiting and some nausea.  He has no worsening of his chronic diarrhea (loose stools a day).  He does acknowledge pain around his perianal area but no blood or mucus in his stools.  ED Course: Found to have small bowel obstruction-GI consulted-recommendations were to start steroids and admit to the hospitalist service.   Consultations:  [facility]: Dr. Alessandra Bevels  Procedures:  None   HOSPITAL COURSE:   Crohn's disease flare Patient did not have any nausea vomiting.  CT scan did not show any obvious bowel obstruction.  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  Patient started feeling better.  He is tolerating his diet without any difficulties.  Seen by gastroenterology this morning and is considered stable for discharge.  They have recommended a prolonged taper with oral steroids.  Discussed with  patient and he also is keen on going home.  He will be prescribed narcotics for pain control.  Outpatient follow-up with gastroenterology.    Bipolar disorder Continue Lamictal  Overall stable.  Okay for discharge home today.   PERTINENT LABS:  The results of significant diagnostics from this hospitalization (including imaging, microbiology, ancillary and laboratory) are listed below for reference.      Labs: Basic Metabolic Panel: Recent Labs  Lab 10/26/17 1111 10/26/17 1846 10/27/17 0624  NA 142  --  142  K 4.3  --  4.1  CL 103  --  104  CO2 28  --  27  GLUCOSE 90  --  133*  BUN <5*  --  <5*  CREATININE 1.06 1.00 0.91  CALCIUM 9.5  --  9.3   Liver Function Tests: Recent Labs  Lab 10/26/17 1111  AST 17  ALT 19  ALKPHOS 94  BILITOT 0.8  PROT 6.7  ALBUMIN 3.7   Recent Labs  Lab 10/26/17 1111  LIPASE 30   CBC: Recent Labs  Lab 10/26/17 1111 10/26/17 1846 10/27/17 0624  WBC 6.7 9.6 10.5  HGB 15.6 15.0 14.6  HCT 45.8 43.5 42.1  MCV 92.2 90.4 89.8  PLT 259 251 286     IMAGING STUDIES Ct Entero Abd/pelvis W Contast  Result Date: 10/26/2017 CLINICAL DATA:  37 year old male with history of generalized abdominal pain for the past 3 days. History of Crohn's disease. Nausea, vomiting and diarrhea. EXAM: CT ABDOMEN AND PELVIS WITH CONTRAST (ENTEROGRAPHY) TECHNIQUE: Multidetector CT of the abdomen and pelvis during bolus administration of intravenous contrast. Negative oral contrast was given. CONTRAST:  184m OMNIPAQUE IOHEXOL 300 MG/ML  SOLN COMPARISON:  CT the abdomen and pelvis 07/24/2017. FINDINGS: Lower chest:  Unremarkable. Hepatobiliary: No cystic or solid hepatic lesions. No intra or extrahepatic biliary ductal dilatation. Gallbladder is normal in appearance. Pancreas: No pancreatic mass. No pancreatic ductal dilatation. No pancreatic or peripancreatic fluid or inflammatory changes. Spleen: Unremarkable. Adrenals/Urinary Tract: Bilateral kidneys and  bilateral adrenal glands are normal in appearance. No hydroureteronephrosis. Urinary bladder is normal in appearance. Stomach/Bowel: Normal appearance of the stomach. No pathologic dilatation of small bowel or colon. Status post appendectomy. In the terminal ileum there is an area of mural thickening and mucosal hyperenhancement best appreciated on axial images 5154 of series 3. Minimal surrounding soft tissue stranding suggesting mild active disease. Several adjacent loops of bowel appear tethered in the right lower quadrant. Vascular/Lymphatic: No significant atherosclerotic disease, aneurysm or dissection noted in the abdominal or pelvic vasculature. No lymphadenopathy noted in the abdomen or pelvis. Reproductive: Prostate gland and seminal vesicles are unremarkable in appearance. Other: No significant volume of ascites.  No pneumoperitoneum. Musculoskeletal: There are no aggressive appearing lytic or blastic lesions noted in the visualized portions of the skeleton. IMPRESSION: 1. There are findings suggestive of active Crohn's disease involving the terminal ileum. In addition to some mild inflammatory changes in this region, there appear to be several loops of small bowel which appear tethered to this region in the right lower quadrant, presumably from adhesions in the mesentery. At this time, this is not associated with bowel obstruction. However, the possibility of enteroenteric fistulae is not entirely excluded. No intra-abdominal abscess noted at this time. Electronically Signed   By: Vinnie Langton M.D.   On: 10/26/2017 16:52   Dg Abdomen Acute W/chest  Result Date: 10/26/2017 CLINICAL DATA:  Abdominal pain EXAM: DG ABDOMEN ACUTE W/ 1V CHEST COMPARISON:  07/24/2017 FINDINGS: There are clustered dilated small bowel loops with fluid levels in the central abdomen. Less colonic gas. No evidence of pneumoperitoneum or pneumatosis. Normal heart size and mediastinal contours. No acute infiltrate or edema. No  effusion or pneumothorax. No acute osseous findings. IMPRESSION: Findings of partial small bowel obstruction. Electronically Signed   By: Monte Fantasia M.D.   On: 10/26/2017 15:16    DISCHARGE EXAMINATION: Vitals:   10/26/17 1800 10/26/17 1834 10/26/17 2128 10/27/17 0548  BP: 135/75 138/83 122/63 119/60  Pulse: 80 81 86 83  Resp:  _0 Temp:  98.8 F (37.1 C) 97.9 F (36.6 C) 98.2 F (36.8 C)  TempSrc:  Oral Oral Oral  SpO2: 97% 98% 97% 99%  Weight:  78 kg    Height:  _1  (1.676 m)     General appearance: alert, cooperative, appears stated age and no distress Resp: clear to auscultation bilaterally Cardio: regular rate and rhythm, S1, S2 normal, no murmur, click, rub or gallop GI: soft, non-tender; bowel sounds normal; no masses,  no organomegaly  DISPOSITION: Home  Discharge Instructions    Call MD for:  difficulty breathing, headache or visual disturbances   Complete by:  As directed    Call MD for:  extreme fatigue   Complete by:  As directed    Call MD for:  hives   Complete by:  As directed    Call MD for:  persistant dizziness or light-headedness   Complete by:  As directed    Call MD for:  persistant nausea and vomiting   Complete by:  As directed    Call MD for:  severe uncontrolled pain   Complete by:  As directed    Call MD for:  temperature >100.4   Complete by:  As directed    Diet general   Complete by:  As directed    Discharge instructions   Complete by:  As directed    Please follow-up with Dr. Michail Sermon as recommended by the gastroenterologist.  Take your medications as prescribed.  You were cared for by a hospitalist during your hospital stay. If you have any questions about your discharge medications or the care you received while you were in the hospital after you are discharged, you can call the unit and asked to speak with the hospitalist on call if the hospitalist that took care of you is not available. Once you are discharged, your primary  care physician will handle any further medical issues. Please note that NO REFILLS for any discharge medications will be authorized once you are discharged, as it is imperative that you return to your primary care physician (or establish a relationship with a primary care physician if you do not have one) for your aftercare needs so that they can reassess your need for medications and monitor your lab values. If you do not have a primary care physician, you can call [phone] for a physician referral.   Increase activity slowly   Complete by:  As directed         Allergies as of 10/27/2017   No Known Allergies     Medication List    STOP taking these medications   CIMZIA PREFILLED 2 X 200 MG/ML Kit Generic drug:  Certolizumab Pegol   dicyclomine 10 MG capsule Commonly known as:  BENTYL   HYDROcodone-acetaminophen 5-325 MG tablet Commonly known as:  NORCO/VICODIN   ondansetron 4 MG disintegrating tablet Commonly known as:  ZOFRAN-ODT   ondansetron 4 MG tablet Commonly known as:  ZOFRAN   traMADol 50 MG tablet Commonly known as:  ULTRAM     TAKE these medications   LAMICTAL 25 MG tablet Generic drug:  lamoTRIgine Take 25 mg by mouth 2 (two) times daily.   omeprazole 40 MG capsule Commonly known as:  PRILOSEC Take 1 capsule (40 mg total) by mouth daily. What changed:    when to take this  reasons to take this   oxyCODONE-acetaminophen 5-325 MG tablet Commonly known as:  PERCOCET/ROXICET Take 1 tablet by mouth every 6 (six) hours as needed for severe pain. What changed:    when to take this  reasons to take this   predniSONE 10 MG tablet Commonly known as:  DELTASONE Take 4 tablets daily for 7 days, then 3 tablets daily for 7 days, then 2 tablets daily for 7 days, then 1 tablet daily. What changed:    how much to take  how to take this  when to take this  additional instructions   TRAZODONE HCL PO Take 1 tablet by mouth at bedtime.         Follow-up Information    Wilford Corner, MD. Schedule an appointment as soon as possible for a visit in 2 week(s).   Specialty:  Gastroenterology Why:  follow-up for Crohn's exacerbation. Contact information: [address] [place] [address] [phone]           TOTAL DISCHARGE TIME: [facility] Pager [phone]  10/27/2017, 12:35 PM

## 2018-01-17 ENCOUNTER — Emergency Department (HOSPITAL_COMMUNITY)
Admission: EM | Admit: 2018-01-17 | Discharge: 2018-01-18 | Disposition: A | Discharge status: Home / Self Care | Payer: Medicare Other | Attending: Emergency Medicine | Admitting: Emergency Medicine

## 2018-01-17 ENCOUNTER — Encounter (HOSPITAL_COMMUNITY): Payer: Self-pay | Admitting: Emergency Medicine

## 2018-01-17 ENCOUNTER — Other Ambulatory Visit: Payer: Self-pay

## 2018-01-17 DIAGNOSIS — F17290 Nicotine dependence, other tobacco product, uncomplicated: Secondary | ICD-10-CM | POA: Diagnosis not present

## 2018-01-17 DIAGNOSIS — Z79899 Other long term (current) drug therapy: Secondary | ICD-10-CM | POA: Insufficient documentation

## 2018-01-17 DIAGNOSIS — I10 Essential (primary) hypertension: Secondary | ICD-10-CM | POA: Diagnosis not present

## 2018-01-17 DIAGNOSIS — K5090 Crohn's disease, unspecified, without complications: Secondary | ICD-10-CM | POA: Diagnosis not present

## 2018-01-17 DIAGNOSIS — K5010 Crohn's disease of large intestine without complications: Secondary | ICD-10-CM

## 2018-01-17 DIAGNOSIS — R1031 Right lower quadrant pain: Secondary | ICD-10-CM | POA: Diagnosis present

## 2018-01-17 LAB — URINALYSIS, ROUTINE W REFLEX MICROSCOPIC
BILIRUBIN URINE: NEGATIVE
Glucose, UA: NEGATIVE mg/dL
Hgb urine dipstick: NEGATIVE
KETONES UR: NEGATIVE mg/dL
LEUKOCYTES UA: NEGATIVE
NITRITE: NEGATIVE
PH: 6 (ref 5.0–8.0)
Protein, ur: NEGATIVE mg/dL
SPECIFIC GRAVITY, URINE: 1.002 — AB (ref 1.005–1.030)

## 2018-01-17 LAB — COMPREHENSIVE METABOLIC PANEL
ALT: 21 U/L (ref 0–44)
ANION GAP: 15 (ref 5–15)
AST: 19 U/L (ref 15–41)
Albumin: 3.9 g/dL (ref 3.5–5.0)
Alkaline Phosphatase: 99 U/L (ref 38–126)
BILIRUBIN TOTAL: 0.5 mg/dL (ref 0.3–1.2)
BUN: <5 mg/dL — ABNORMAL LOW (ref 6–20)
CHLORIDE: 103 mmol/L (ref 98–111)
CO2: 23 mmol/L (ref 22–32)
Calcium: 9.3 mg/dL (ref 8.9–10.3)
Creatinine, Ser: 0.95 mg/dL (ref 0.61–1.24)
GFR calc Af Amer: >60 mL/min (ref >60)
Glucose, Bld: 146 mg/dL — ABNORMAL HIGH (ref 70–99)
POTASSIUM: 4.1 mmol/L (ref 3.5–5.1)
Sodium: 141 mmol/L (ref 135–145)
TOTAL PROTEIN: 7.1 g/dL (ref 6.5–8.1)

## 2018-01-17 LAB — CBC
HEMATOCRIT: 49.4 % (ref 39.0–52.0)
HEMOGLOBIN: 16.5 g/dL (ref 13.0–17.0)
MCH: 30 pg (ref 26.0–34.0)
MCHC: 33.4 g/dL (ref 30.0–36.0)
MCV: 89.8 fL (ref 80.0–100.0)
NRBC: 0 % (ref 0.0–0.2)
Platelets: 310 10*3/uL (ref 150–400)
RBC: 5.5 MIL/uL (ref 4.22–5.81)
RDW: 13.4 % (ref 11.5–15.5)
WBC: 12.9 10*3/uL — AB (ref 4.0–10.5)

## 2018-01-17 LAB — LIPASE, BLOOD: LIPASE: 53 U/L — AB (ref 11–51)

## 2018-01-17 NOTE — ED Triage Notes (Signed)
Pt c/o sharp abdominal pain. Hx Crohn's, denies nausea/vomiting/diarrhea.

## 2018-01-18 ENCOUNTER — Emergency Department (HOSPITAL_COMMUNITY): Payer: Medicare Other

## 2018-01-18 DIAGNOSIS — K5090 Crohn's disease, unspecified, without complications: Secondary | ICD-10-CM | POA: Diagnosis not present

## 2018-01-18 MED ORDER — SODIUM CHLORIDE 0.9 % IV BOLUS
500.0000 mL | Freq: Once | INTRAVENOUS | Status: AC
  Administered 2018-01-18: 500 mL via INTRAVENOUS

## 2018-01-18 MED ORDER — OXYCODONE-ACETAMINOPHEN 5-325 MG PO TABS: 1.0000 | ORAL_TABLET | Freq: Three times a day (TID) | ORAL | 0 refills | Status: AC | PRN

## 2018-01-18 MED ORDER — ONDANSETRON HCL 4 MG/2ML IJ SOLN
4.0000 mg | Freq: Once | INTRAMUSCULAR | Status: AC
  Administered 2018-01-18: 4 mg via INTRAVENOUS
  Filled 2018-01-18: qty 2

## 2018-01-18 MED ORDER — SUCRALFATE 1 G PO TABS: 1.0000 g | ORAL_TABLET | Freq: Three times a day (TID) | ORAL | 0 refills | Status: DC

## 2018-01-18 MED ORDER — ACETAMINOPHEN 325 MG PO TABS: 650.0000 mg | ORAL_TABLET | Freq: Four times a day (QID) | ORAL | 0 refills | Status: DC | PRN

## 2018-01-18 MED ORDER — ONDANSETRON 8 MG PO TBDP: 8.0000 mg | ORAL_TABLET | Freq: Three times a day (TID) | ORAL | 0 refills | Status: DC | PRN

## 2018-01-18 MED ORDER — SUCRALFATE 1 G PO TABS
1.0000 g | ORAL_TABLET | Freq: Once | ORAL | Status: AC
  Administered 2018-01-18: 1 g via ORAL
  Filled 2018-01-18: qty 1

## 2018-01-18 MED ORDER — HYDROMORPHONE HCL 1 MG/ML IJ SOLN
1.0000 mg | Freq: Once | INTRAMUSCULAR | Status: AC
  Administered 2018-01-18: 1 mg via INTRAVENOUS
  Filled 2018-01-18: qty 1

## 2018-01-18 MED ORDER — PANTOPRAZOLE SODIUM 40 MG IV SOLR
40.0000 mg | Freq: Once | INTRAVENOUS | Status: AC
  Administered 2018-01-18: 40 mg via INTRAVENOUS
  Filled 2018-01-18: qty 40

## 2018-01-18 NOTE — ED Notes (Signed)
Pt wanting more pain med whenever he arrived back from c-t

## 2018-01-18 NOTE — ED Notes (Signed)
hjx crohns disease n and v for several days

## 2018-01-18 NOTE — Discharge Instructions (Addendum)
Take the medications prescribed for what appears to be Crohn's flareup. Since you are already on prednisone, we are not giving you more prednisone.  We would like you to call your GI doctor for a close follow-up. Please return to the ER if your symptoms worsen; you have increased pain, fevers, chills, inability to keep any medications down, bloody diarrhea, abdominal distention.

## 2018-01-18 NOTE — ED Provider Notes (Signed)
[facility] EMERGENCY DEPARTMENT Provider Note   CSN: [id number] Arrival date & time: 01/17/18  2237     History   Chief Complaint Chief Complaint  Patient presents with  . Abdominal Pain    HPI Jeffery Mcintosh is a 37 y.o. male.  HPI  37 year old male comes in with chief complaint of abdominal pain.  Patient has history of Crohn's disease and reports that he started having abdominal pain 2 days ago.  Pain is located in the lower quadrants of the abdomen on the right side.  He is also having burning type pain in his chest and epigastrium. Patient reports that he gets intermittent Crohn's flareup.  He is on prednisone and gets some other medication by Dr. Michail Sermon, his GI.  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  His last BM was yesterday and it was nonbloody.  Patient denies any vomiting but does have nausea.  Review of systems also negative for fevers, chills.  Patient has not taken any pain medication at home.   Past Medical History:  Diagnosis Date  . Anxiety   . Arthritis   . Bipolar affective ([place])   . Chronic headaches   . Crohn's disease ([place]) history of  . Depression   . GERD (gastroesophageal reflux disease)   . Hypertension   . Schizophrenic disorder [facility])     Patient Active Problem List   Diagnosis Date Noted  . Crohn's disease of jejunum with intestinal obstruction ([place]) 10/26/2017  . SBO (small bowel obstruction) ([place]) 10/26/2017  . Bipolar 1 disorder ([place]) 10/26/2017  . Crohn's colitis, unspecified complication ([place]) 49/70/2637  . Epigastric burning sensation 01/03/2015  . Numbness and tingling in left arm 11/15/2014  . Exacerbation of Crohn's disease ([place]) 10/21/2014  . Crohn's disease ([place]) 10/21/2014  . Diarrhea 10/21/2014  . Abdominal pain   . Gout 04/18/2013  . Cubital tunnel syndrome 04/18/2013  . Abnormal urinalysis 04/18/2013    Past Surgical History:  Procedure Laterality Date    . NO PAST SURGERIES          Home Medications    Prior to Admission medications   Medication Sig Start Date End Date Taking? Authorizing Provider  Certolizumab Pegol (CIMZIA PREFILLED [place]) Inject 1 each into the skin every 30 (thirty) days.   Yes [provider]  gabapentin (NEURONTIN) 400 MG capsule Take 400 mg by mouth 2 (two) times daily.   Yes [provider]  predniSONE (DELTASONE) 10 MG tablet Take 4 tablets daily for 7 days, then 3 tablets daily for 7 days, then 2 tablets daily for 7 days, then 1 tablet daily. Patient taking differently: Take 10 mg by mouth daily with breakfast.  10/27/17  Yes Bonnielee Haff, MD  acetaminophen (TYLENOL) 325 MG tablet Take 2 tablets (650 mg total) by mouth every 6 (six) hours as needed. 01/18/18   Varney Biles, MD  omeprazole (PRILOSEC) 40 MG capsule Take 1 capsule (40 mg total) by mouth daily. Patient not taking: Reported on 01/18/2018 10/17/16   Nita Sells, MD  ondansetron (ZOFRAN ODT) 8 MG disintegrating tablet Take 1 tablet (8 mg total) by mouth every 8 (eight) hours as needed for nausea. 01/18/18   Varney Biles, MD  oxyCODONE-acetaminophen (PERCOCET/ROXICET) 5-325 MG tablet Take 1 tablet by mouth every 8 (eight) hours as needed for up to 4 days for severe pain. 01/18/18 01/22/18  Varney Biles, MD  sucralfate (CARAFATE) 1 g tablet Take 1 tablet (1  g total) by mouth 4 (four) times daily -  with meals and at bedtime. 01/18/18   Varney Biles, MD    Family History Family History  Problem Relation Age of Onset  . Hypertension Mother   . Heart attack Mother   . Stroke Mother   . Alcohol abuse Father   . Mental illness Father   . Multiple sclerosis Father   . Arthritis Maternal Grandmother   . Hypertension Maternal Grandmother   . Heart disease Maternal Grandmother   . Kidney disease Maternal Grandmother   . Diabetes Maternal Grandmother   . Cancer Maternal Grandmother        great, type unknown  .  Hyperlipidemia Maternal Grandfather   . Colon polyps Maternal Grandfather   . Stroke Paternal Grandmother   . Ulcerative colitis Maternal Aunt     Social History Social History   Tobacco Use  . Smoking status: Current Every Day Smoker    Types: Cigars  . Smokeless tobacco: Never Used  Substance Use Topics  . Alcohol use: No    Alcohol/week: 0.0 standard drinks  . Drug use: No     Allergies   Patient has no known allergies.   Review of Systems Review of Systems  Constitutional: Positive for activity change.  Respiratory: Negative for shortness of breath.   Gastrointestinal: Positive for abdominal pain and nausea.  Allergic/Immunologic: Positive for immunocompromised state.  Hematological: Does not bruise/bleed easily.  All other systems reviewed and are negative.    Physical Exam Updated Vital Signs BP 129/82 (BP Location: Left Arm)   Pulse 91   Temp 98.5 F (36.9 C) (Oral)   Resp 18   SpO2 93%   Physical Exam Vitals signs and nursing note reviewed.  Constitutional:      Appearance: He is well-developed.  HENT:     Head: Atraumatic.  Neck:     Musculoskeletal: Neck supple.  Cardiovascular:     Rate and Rhythm: Normal rate.  Pulmonary:     Effort: Pulmonary effort is normal.  Abdominal:     Palpations: Abdomen is soft.     Tenderness: There is generalized abdominal tenderness. There is no guarding or rebound.  Skin:    General: Skin is warm.  Neurological:     Mental Status: He is alert and oriented to person, place, and time.      ED Treatments / Results  Labs (all labs ordered are listed, but only abnormal results are displayed) Labs Reviewed  LIPASE, BLOOD - Abnormal; Notable for the following components:      Result Value   Lipase 53 (*)    All other components within normal limits  COMPREHENSIVE METABOLIC PANEL - Abnormal; Notable for the following components:   Glucose, Bld 146 (*)    BUN <5 (*)    All other components within normal  limits  CBC - Abnormal; Notable for the following components:   WBC 12.9 (*)    All other components within normal limits  URINALYSIS, ROUTINE W REFLEX MICROSCOPIC - Abnormal; Notable for the following components:   Color, Urine STRAW (*)    Specific Gravity, Urine 1.002 (*)    All other components within normal limits    EKG None  Radiology Dg Abd Acute W/chest  Result Date: 01/18/2018 CLINICAL DATA:  Sharp abdominal pain.  History of Crohn's disease. EXAM: DG ABDOMEN ACUTE W/ 1V CHEST COMPARISON:  October 26, 2017. FINDINGS: The cardiomediastinal silhouette is normal in size. Normal pulmonary vascularity. No focal  consolidation, pleural effusion, or pneumothorax. Clustered mildly dilated loops of small bowel with fluid levels in the central abdomen, similar to prior study. Air and stool throughout the colon. No evidence of pneumoperitoneum. No acute osseous abnormality. IMPRESSION: Similar-appearing clustered mildly dilated loops of small bowel in the central abdomen, which could reflect partial small bowel obstruction. Electronically Signed   By: Titus Dubin M.D.   On: 01/18/2018 02:19    Procedures Procedures (including critical care time)  Medications Ordered in ED Medications  HYDROmorphone (DILAUDID) injection 1 mg (1 mg Intravenous Given 01/18/18 0117)  pantoprazole (PROTONIX) injection 40 mg (40 mg Intravenous Given 01/18/18 0112)  sodium chloride 0.9 % bolus 500 mL (0 mLs Intravenous Stopped 01/18/18 0230)  ondansetron (ZOFRAN) injection 4 mg (4 mg Intravenous Given 01/18/18 0111)  sucralfate (CARAFATE) tablet 1 g (1 g Oral Given 01/18/18 0123)  HYDROmorphone (DILAUDID) injection 1 mg (1 mg Intravenous Given 01/18/18 0352)     Initial Impression / Assessment and Plan / ED Course  I have reviewed the triage vital signs and the nursing notes.  Pertinent labs & imaging results that were available during my care of the patient were reviewed by me and considered in my  medical decision making (see chart for details).  Clinical Course as of Jan 19 531  Mon Jan 18, 2018  6789 Acute abdominal series did not look convincing for any red flags for SBO.  Patient is passing flatus and he is not having any emesis or abdominal distention.  After the first round of pain medicine he was still hurting therefore we tried second round of pain medication.  After the second Dilaudid his pain improved to 3 out of 10 and he was comfortable going home.  Strict ER return precautions have been discussed with the patient. [place] controlled substance database does not reveal multiple narcotic prescriptions.  He will receive 12 Percocets for pain control and has been advised to follow-up with GI as soon as possible.  DG Abd Acute W/Chest [AN]    Clinical Course User Index [AN] Varney Biles, MD    37 year old male with history of Crohn's with prior complication of SBO and ileus comes in with chief complaint of abdominal pain. It appears that he has had pain for 2 days and has had constipation and nausea.  He has no fevers and the white count is only mildly elevated.  Abdominal exam is non-revealing any peritoneal findings  Plan is to initiate oral challenge and get pain controlled.  Final Clinical Impressions(s) / ED Diagnoses   Final diagnoses:  Crohn's disease of colon without complication [facility])    ED Discharge Orders         Ordered    oxyCODONE-acetaminophen (PERCOCET/ROXICET) 5-325 MG tablet  Every 8 hours PRN     01/18/18 0512    acetaminophen (TYLENOL) 325 MG tablet  Every 6 hours PRN     01/18/18 0512    ondansetron (ZOFRAN ODT) 8 MG disintegrating tablet  Every 8 hours PRN     01/18/18 0513    sucralfate (CARAFATE) 1 g tablet  3 times daily with meals & bedtime     01/18/18 0513           Varney Biles, MD 01/18/18 0532

## 2018-01-18 NOTE — ED Notes (Signed)
To ct

## 2018-04-17 ENCOUNTER — Inpatient Hospital Stay (HOSPITAL_COMMUNITY)
Admission: EM | Admit: 2018-04-17 | Discharge: 2018-04-20 | DRG: 387 | Discharge status: Against Medical Advice | Payer: Medicare Other | Attending: Family Medicine | Admitting: Family Medicine

## 2018-04-17 ENCOUNTER — Emergency Department (HOSPITAL_COMMUNITY): Payer: Medicare Other

## 2018-04-17 ENCOUNTER — Encounter (HOSPITAL_COMMUNITY): Payer: Self-pay | Admitting: Emergency Medicine

## 2018-04-17 ENCOUNTER — Other Ambulatory Visit: Payer: Self-pay

## 2018-04-17 DIAGNOSIS — Z820 Family history of epilepsy and other diseases of the nervous system: Secondary | ICD-10-CM

## 2018-04-17 DIAGNOSIS — Z8249 Family history of ischemic heart disease and other diseases of the circulatory system: Secondary | ICD-10-CM | POA: Diagnosis not present

## 2018-04-17 DIAGNOSIS — F17290 Nicotine dependence, other tobacco product, uncomplicated: Secondary | ICD-10-CM | POA: Diagnosis present

## 2018-04-17 DIAGNOSIS — R109 Unspecified abdominal pain: Secondary | ICD-10-CM | POA: Diagnosis present

## 2018-04-17 DIAGNOSIS — Z823 Family history of stroke: Secondary | ICD-10-CM | POA: Diagnosis not present

## 2018-04-17 DIAGNOSIS — F319 Bipolar disorder, unspecified: Secondary | ICD-10-CM | POA: Diagnosis present

## 2018-04-17 DIAGNOSIS — K5090 Crohn's disease, unspecified, without complications: Secondary | ICD-10-CM | POA: Diagnosis present

## 2018-04-17 DIAGNOSIS — Z79899 Other long term (current) drug therapy: Secondary | ICD-10-CM | POA: Diagnosis not present

## 2018-04-17 DIAGNOSIS — Z8349 Family history of other endocrine, nutritional and metabolic diseases: Secondary | ICD-10-CM

## 2018-04-17 DIAGNOSIS — Z5329 Procedure and treatment not carried out because of patient's decision for other reasons: Secondary | ICD-10-CM | POA: Diagnosis not present

## 2018-04-17 DIAGNOSIS — K219 Gastro-esophageal reflux disease without esophagitis: Secondary | ICD-10-CM | POA: Diagnosis present

## 2018-04-17 DIAGNOSIS — Z8261 Family history of arthritis: Secondary | ICD-10-CM

## 2018-04-17 DIAGNOSIS — F1210 Cannabis abuse, uncomplicated: Secondary | ICD-10-CM | POA: Diagnosis present

## 2018-04-17 DIAGNOSIS — F209 Schizophrenia, unspecified: Secondary | ICD-10-CM | POA: Diagnosis present

## 2018-04-17 DIAGNOSIS — Z818 Family history of other mental and behavioral disorders: Secondary | ICD-10-CM

## 2018-04-17 DIAGNOSIS — K56609 Unspecified intestinal obstruction, unspecified as to partial versus complete obstruction: Secondary | ICD-10-CM | POA: Diagnosis present

## 2018-04-17 DIAGNOSIS — K50112 Crohn's disease of large intestine with intestinal obstruction: Secondary | ICD-10-CM | POA: Diagnosis not present

## 2018-04-17 DIAGNOSIS — Z811 Family history of alcohol abuse and dependence: Secondary | ICD-10-CM

## 2018-04-17 DIAGNOSIS — R140 Abdominal distension (gaseous): Secondary | ICD-10-CM

## 2018-04-17 DIAGNOSIS — I10 Essential (primary) hypertension: Secondary | ICD-10-CM | POA: Diagnosis present

## 2018-04-17 DIAGNOSIS — K50812 Crohn's disease of both small and large intestine with intestinal obstruction: Secondary | ICD-10-CM | POA: Diagnosis present

## 2018-04-17 DIAGNOSIS — Z9119 Patient's noncompliance with other medical treatment and regimen: Secondary | ICD-10-CM | POA: Diagnosis not present

## 2018-04-17 DIAGNOSIS — K50119 Crohn's disease of large intestine with unspecified complications: Secondary | ICD-10-CM | POA: Diagnosis not present

## 2018-04-17 DIAGNOSIS — R197 Diarrhea, unspecified: Secondary | ICD-10-CM | POA: Diagnosis present

## 2018-04-17 DIAGNOSIS — Z8371 Family history of colonic polyps: Secondary | ICD-10-CM | POA: Diagnosis not present

## 2018-04-17 DIAGNOSIS — Z833 Family history of diabetes mellitus: Secondary | ICD-10-CM

## 2018-04-17 DIAGNOSIS — F4310 Post-traumatic stress disorder, unspecified: Secondary | ICD-10-CM | POA: Diagnosis present

## 2018-04-17 DIAGNOSIS — F419 Anxiety disorder, unspecified: Secondary | ICD-10-CM | POA: Diagnosis present

## 2018-04-17 DIAGNOSIS — Z4659 Encounter for fitting and adjustment of other gastrointestinal appliance and device: Secondary | ICD-10-CM

## 2018-04-17 DIAGNOSIS — Z91199 Patient's noncompliance with other medical treatment and regimen due to unspecified reason: Secondary | ICD-10-CM

## 2018-04-17 DIAGNOSIS — Z9114 Patient's other noncompliance with medication regimen: Secondary | ICD-10-CM | POA: Diagnosis not present

## 2018-04-17 HISTORY — DX: Patient's noncompliance with other medical treatment and regimen due to unspecified reason: Z91.199

## 2018-04-17 HISTORY — DX: Patient's noncompliance with other medical treatment and regimen: Z91.19

## 2018-04-17 HISTORY — DX: Post-traumatic stress disorder, unspecified: F43.10

## 2018-04-17 LAB — URINALYSIS, ROUTINE W REFLEX MICROSCOPIC
Bilirubin Urine: NEGATIVE
Glucose, UA: NEGATIVE mg/dL
Hgb urine dipstick: NEGATIVE
Ketones, ur: NEGATIVE mg/dL
Leukocytes,Ua: NEGATIVE
Nitrite: NEGATIVE
Protein, ur: NEGATIVE mg/dL
Specific Gravity, Urine: 1.002 — ABNORMAL LOW (ref 1.005–1.030)
pH: 7 (ref 5.0–8.0)

## 2018-04-17 LAB — RAPID URINE DRUG SCREEN, HOSP PERFORMED
Amphetamines: NOT DETECTED
BARBITURATES: POSITIVE — AB
Benzodiazepines: NOT DETECTED
Cocaine: NOT DETECTED
Opiates: NOT DETECTED
Tetrahydrocannabinol: POSITIVE — AB

## 2018-04-17 LAB — CBC
HCT: 43.2 % (ref 39.0–52.0)
HEMATOCRIT: 45.7 % (ref 39.0–52.0)
Hemoglobin: 15.4 g/dL (ref 13.0–17.0)
Hemoglobin: 15.1 g/dL (ref 13.0–17.0)
MCH: 29.9 pg (ref 26.0–34.0)
MCH: 30.8 pg (ref 26.0–34.0)
MCHC: 33.7 g/dL (ref 30.0–36.0)
MCHC: 35 g/dL (ref 30.0–36.0)
MCV: 88.7 fL (ref 80.0–100.0)
MCV: 88 fL (ref 80.0–100.0)
Platelets: 266 10*3/uL (ref 150–400)
Platelets: 241 10*3/uL (ref 150–400)
RBC: 5.15 MIL/uL (ref 4.22–5.81)
RBC: 4.91 MIL/uL (ref 4.22–5.81)
RDW: 13.5 % (ref 11.5–15.5)
RDW: 13.6 % (ref 11.5–15.5)
WBC: 6.5 10*3/uL (ref 4.0–10.5)
WBC: 6.4 10*3/uL (ref 4.0–10.5)
nRBC: 0 % (ref 0.0–0.2)
nRBC: 0 % (ref 0.0–0.2)

## 2018-04-17 LAB — CREATININE, SERUM
Creatinine, Ser: 0.94 mg/dL (ref 0.61–1.24)
GFR calc Af Amer: >60 mL/min (ref >60)
GFR calc non Af Amer: >60 mL/min (ref >60)

## 2018-04-17 LAB — COMPREHENSIVE METABOLIC PANEL
ALT: 16 U/L (ref 0–44)
AST: 15 U/L (ref 15–41)
Albumin: 3.6 g/dL (ref 3.5–5.0)
Alkaline Phosphatase: 82 U/L (ref 38–126)
Anion gap: 11 (ref 5–15)
BUN: 7 mg/dL (ref 6–20)
CHLORIDE: 106 mmol/L (ref 98–111)
CO2: 24 mmol/L (ref 22–32)
Calcium: 8.9 mg/dL (ref 8.9–10.3)
Creatinine, Ser: 0.93 mg/dL (ref 0.61–1.24)
GFR calc Af Amer: >60 mL/min (ref >60)
GFR calc non Af Amer: >60 mL/min (ref >60)
Glucose, Bld: 93 mg/dL (ref 70–99)
Potassium: 3.5 mmol/L (ref 3.5–5.1)
Sodium: 141 mmol/L (ref 135–145)
Total Bilirubin: 0.5 mg/dL (ref 0.3–1.2)
Total Protein: 6.7 g/dL (ref 6.5–8.1)

## 2018-04-17 LAB — ETHANOL: Alcohol, Ethyl (B): <10 mg/dL (ref <10)

## 2018-04-17 LAB — LIPASE, BLOOD: Lipase: 32 U/L (ref 11–51)

## 2018-04-17 MED ORDER — ONDANSETRON HCL 4 MG/2ML IJ SOLN
4.0000 mg | Freq: Once | INTRAMUSCULAR | Status: AC
  Administered 2018-04-17: 4 mg via INTRAVENOUS
  Filled 2018-04-17: qty 2

## 2018-04-17 MED ORDER — ONDANSETRON HCL 4 MG PO TABS: 4.0000 mg | ORAL_TABLET | Freq: Four times a day (QID) | ORAL | Status: DC | PRN

## 2018-04-17 MED ORDER — SODIUM CHLORIDE 0.9% FLUSH: 3.0000 mL | Freq: Once | INTRAVENOUS | Status: DC

## 2018-04-17 MED ORDER — MORPHINE SULFATE (PF) 4 MG/ML IV SOLN
4.0000 mg | Freq: Once | INTRAVENOUS | Status: AC
  Administered 2018-04-17: 4 mg via INTRAVENOUS
  Filled 2018-04-17: qty 1

## 2018-04-17 MED ORDER — SODIUM CHLORIDE 0.9 % IV BOLUS
1000.0000 mL | Freq: Once | INTRAVENOUS | Status: AC
  Administered 2018-04-17: 1000 mL via INTRAVENOUS

## 2018-04-17 MED ORDER — SODIUM CHLORIDE 0.9 % IV SOLN
INTRAVENOUS | Status: DC
  Administered 2018-04-18 – 2018-04-20 (×4): via INTRAVENOUS

## 2018-04-17 MED ORDER — HYDROMORPHONE HCL 1 MG/ML IJ SOLN: 1.0000 mg | Freq: Once | INTRAMUSCULAR | Status: DC

## 2018-04-17 MED ORDER — PROMETHAZINE HCL 25 MG/ML IJ SOLN
25.0000 mg | Freq: Once | INTRAMUSCULAR | Status: AC
  Administered 2018-04-17: 25 mg via INTRAVENOUS
  Filled 2018-04-17: qty 1

## 2018-04-17 MED ORDER — HEPARIN SODIUM (PORCINE) 5000 UNIT/ML IJ SOLN
5000.0000 [IU] | Freq: Three times a day (TID) | INTRAMUSCULAR | Status: DC
  Administered 2018-04-18 – 2018-04-20 (×6): 5000 [IU] via SUBCUTANEOUS
  Filled 2018-04-17 (×6): qty 1

## 2018-04-17 MED ORDER — ONDANSETRON HCL 4 MG/2ML IJ SOLN
4.0000 mg | Freq: Four times a day (QID) | INTRAMUSCULAR | Status: DC | PRN
  Administered 2018-04-19 – 2018-04-20 (×4): 4 mg via INTRAVENOUS
  Filled 2018-04-17 (×4): qty 2

## 2018-04-17 MED ORDER — HYDROMORPHONE HCL 1 MG/ML IJ SOLN
1.0000 mg | INTRAMUSCULAR | Status: DC | PRN
  Administered 2018-04-18 (×3): 2 mg via INTRAVENOUS
  Filled 2018-04-17 (×4): qty 2

## 2018-04-17 MED ORDER — PANTOPRAZOLE SODIUM 40 MG IV SOLR
40.0000 mg | Freq: Once | INTRAVENOUS | Status: AC
  Administered 2018-04-17: 40 mg via INTRAVENOUS
  Filled 2018-04-17: qty 40

## 2018-04-17 MED ORDER — METHYLPREDNISOLONE SODIUM SUCC 40 MG IJ SOLR
40.0000 mg | Freq: Two times a day (BID) | INTRAMUSCULAR | Status: DC
  Administered 2018-04-17 – 2018-04-20 (×6): 40 mg via INTRAVENOUS
  Filled 2018-04-17 (×6): qty 1

## 2018-04-17 MED ORDER — KETOROLAC TROMETHAMINE 30 MG/ML IJ SOLN
30.0000 mg | Freq: Four times a day (QID) | INTRAMUSCULAR | Status: DC | PRN
  Administered 2018-04-18: 30 mg via INTRAVENOUS
  Filled 2018-04-17: qty 1

## 2018-04-17 MED ORDER — SUCRALFATE 1 G PO TABS
1.0000 g | ORAL_TABLET | Freq: Once | ORAL | Status: AC
  Administered 2018-04-17: 1 g via ORAL
  Filled 2018-04-17: qty 1

## 2018-04-17 MED ORDER — HYDROMORPHONE HCL 1 MG/ML IJ SOLN
1.0000 mg | Freq: Once | INTRAMUSCULAR | Status: AC
  Administered 2018-04-17: 1 mg via INTRAVENOUS
  Filled 2018-04-17: qty 1

## 2018-04-17 MED ORDER — METHYLPREDNISOLONE SODIUM SUCC 125 MG IJ SOLR: 125.0000 mg | Freq: Once | INTRAMUSCULAR | Status: DC

## 2018-04-17 NOTE — ED Notes (Signed)
Patient transported to CT 

## 2018-04-17 NOTE — H&P (Signed)
[facility] History and Physical  Jeffery Mcintosh GUR:[id number] DOB: 21-Dec-1980 DOA: 04/17/2018   PCP: Patient, No Pcp Per   Chief Complaint: 2 weeks of abdominal cramping/watery diarrhea  HPI: Jeffery Mcintosh is a 38 y.o. BM PMHx PTSD, anxiety, Bipolar affective, Depression, Schizophrenic disorder, Chronic Headache, HTN, Crohn's disease, SBO, Fistula, noncompliance, substance abuse (marijuana)  history of Crohn's disease, on Simzia (last dose 04/08/2018)?, bipolar disorder, GERD, hypertension presenting for lower abdominal pain, nausea, and vomiting.  Patient reports that for the past 2 weeks he has been struggling with what he describes as a "Crohn's flare".  He reports that he has lower abdominal cramping as well as 5 days 6 episodes of watery diarrhea today.  He denies seeing any melena or hematochezia.  He reports that he is still passing gas and has noticed no changes in his gas pattern.  He also reports 5 times emesis yesterday nonbilious and nonbloody.  Patient reports he is taken omeprazole for his pain without relief.  He also takes gabapentin for PTSD.  Patient reports that he had a couple leftover prednisone pills from last taper which she is taken.  Patient reports that he is not receiving any regular follow-up with GI due to being discharged from the [facility] GI practice.  He reports that he received a letter due to missed appointments.      Review of Systems:  Constitutional:  No weight loss, night sweats, Fevers, chills, fatigue.  HEENT:  No headaches, Difficulty swallowing,Tooth/dental problems,Sore throat,  No sneezing, itching, ear ache, nasal congestion, post nasal drip,  Cardio-vascular:  No chest pain, Orthopnea, PND, swelling in lower extremities, anasarca, dizziness, palpitations  GI:  Positive heartburn, indigestion, abdominal pain, nausea, vomiting, diarrhea, change in bowel habits, loss of appetite  Resp:  No shortness of breath with exertion or at rest.  No excess mucus, no productive cough, No non-productive cough, No coughing up of blood.No change in color of mucus.No wheezing.No chest wall deformity  Skin:  no rash or lesions.  GU:  no dysuria, change in color of urine, no urgency or frequency. No flank pain.  Musculoskeletal:  No joint pain or swelling. No decreased range of motion. No back pain.  Psych:  No change in mood or affect. No depression or anxiety. No memory loss.   Past Medical History:  Diagnosis Date  . Anxiety   . Arthritis   . Bipolar affective ([place])   . Chronic headaches   . Crohn's disease ([place]) history of  . Depression   . GERD (gastroesophageal reflux disease)   . Hypertension   . Noncompliance 04/17/2018  . PTSD (post-traumatic stress disorder) 04/17/2018  . Schizophrenic disorder [facility])    Past Surgical History:  Procedure Laterality Date  . NO PAST SURGERIES     Social History:  reports that he has been smoking cigars. He has never used smokeless tobacco. He reports that he does not drink alcohol or use drugs.  No Known Allergies  Family History  Problem Relation Age of Onset  . Hypertension Mother   . Heart attack Mother   . Stroke Mother   . Alcohol abuse Father   . Mental illness Father   . Multiple sclerosis Father   . Arthritis Maternal Grandmother   . Hypertension Maternal Grandmother   . Heart disease Maternal Grandmother   . Kidney disease Maternal Grandmother   . Diabetes Maternal Grandmother   . Cancer Maternal Grandmother        great, type unknown  .  Hyperlipidemia Maternal Grandfather   . Colon polyps Maternal Grandfather   . Stroke Paternal Grandmother   . Ulcerative colitis Maternal Aunt     Prior to Admission medications   Medication Sig Start Date End Date Taking? Authorizing Provider  gabapentin (NEURONTIN) 300 MG capsule Take 300 mg by mouth 3 (three) times daily.    Yes [provider]  acetaminophen (TYLENOL) 325 MG tablet Take 2 tablets (650 mg total) by  mouth every 6 (six) hours as needed. Patient not taking: Reported on 04/17/2018 01/18/18   Varney Biles, MD  omeprazole (PRILOSEC) 40 MG capsule Take 1 capsule (40 mg total) by mouth daily. Patient not taking: Reported on 01/18/2018 10/17/16   Nita Sells, MD  sucralfate (CARAFATE) 1 g tablet Take 1 tablet (1 g total) by mouth 4 (four) times daily -  with meals and at bedtime. Patient not taking: Reported on 04/17/2018 01/18/18   Varney Biles, MD     Consultants:  Dr. Stacie Glaze GI (ED physician discussed case with GI)    Procedures/Significant Events:  None  I have personally reviewed and interpreted all radiology studies and my findings are as above.   VENTILATOR SETTINGS:    Cultures   Antimicrobials: Anti-infectives (From admission, onward)   None       Devices   LINES / TUBES:      Continuous Infusions: . sodium chloride      Physical Exam: Vitals:   04/17/18 1736 04/17/18 1755 04/17/18 1915 04/17/18 1930  BP: (!) 153/85 (!) 141/88 128/83 126/80  Pulse: 79 73 72 75  Resp: 20 12 16 14   Temp: 98 F (36.7 C) 98.2 F (36.8 C)    TempSrc: Oral Oral    SpO2: 100% 100% 98% 97%  Weight: 77.1 kg 80.7 kg    Height: 5' 5"  (1.651 m) 5' 6"  (1.676 m)      Wt Readings from Last 3 Encounters:  04/17/18 80.7 kg  10/26/17 78 kg  07/24/17 76.2 kg    General: No acute respiratory distress Eyes: negative scleral hemorrhage, negative anisocoria, negative icterus ENT: Negative Runny nose, negative gingival bleeding, Neck:  Negative scars, masses, torticollis, lymphadenopathy, JVD Lungs: Clear to auscultation bilaterally without wheezes or crackles Cardiovascular: Regular rate and rhythm without murmur gallop or rub normal S1 and S2 Abdomen: Positive abdominal pain (RLQ/LLQ), nondistended, negative positive soft, bowel sounds, no rebound, no ascites, no appreciable mass Extremities: No significant cyanosis, clubbing, or edema bilateral lower  extremities Skin: Negative rashes, lesions, ulcers Psychiatric:  Negative depression, negative anxiety, negative fatigue, negative mania, POOR UNDERSTANDING of disease process Central nervous system:  Cranial nerves II through XII intact, tongue/uvula midline, all extremities muscle strength 5/5, sensation intact throughout, negative dysarthria, negative expressive aphasia, negative receptive aphasia.        Labs on Admission:  Basic Metabolic Panel: Recent Labs  Lab 04/17/18 1749 04/17/18 2209  NA 141  --   K 3.5  --   CL 106  --   CO2 24  --   GLUCOSE 93  --   BUN 7  --   CREATININE 0.93 0.94  CALCIUM 8.9  --    Liver Function Tests: Recent Labs  Lab 04/17/18 1749  AST 15  ALT 16  ALKPHOS 82  BILITOT 0.5  PROT 6.7  ALBUMIN 3.6   Recent Labs  Lab 04/17/18 1749  LIPASE 32   No results for input(s): AMMONIA in the last 168 hours. CBC: Recent Labs  Lab 04/17/18  1749 04/17/18 2209  WBC 6.5 6.4  HGB 15.4 15.1  HCT 45.7 43.2  MCV 88.7 88.0  PLT 266 241   Cardiac Enzymes: No results for input(s): CKTOTAL, CKMB, CKMBINDEX, TROPONINI in the last 168 hours.  BNP (last 3 results) No results for input(s): BNP in the last 8760 hours.  ProBNP (last 3 results) No results for input(s): PROBNP in the last 8760 hours.  CBG: No results for input(s): GLUCAP in the last 168 hours.  Radiological Exams on Admission: Dg Abdomen Acute W/chest  Result Date: 04/17/2018 CLINICAL DATA:  Upper, central chest pain. Bilateral lower quadrant abdominal pain. Nausea and diarrhea for the past 2 weeks. Vomiting yesterday. History of Crohn's disease and hypertension. Smoker. EXAM: DG ABDOMEN ACUTE W/ 1V CHEST COMPARISON:  01/18/2018. Abdomen and pelvis CT dated 10/26/2017. FINDINGS: Normal sized heart. Clear lungs. Stable mild peribronchial thickening. Minimally dilated bowel loops in the mid abdomen with air-fluid levels. No free peritoneal air. Unremarkable bones. IMPRESSION: 1. Minimal  partial small bowel obstruction. 2. Stable mild chronic bronchitic changes. Electronically Signed   By: Claudie Revering M.D.   On: 04/17/2018 20:03    EKG: Independently reviewed.   Assessment/Plan Active Problems:   Crohn's disease ([place])   Diarrhea   Crohn's colitis, unspecified complication (HCC)   SBO (small bowel obstruction) (HCC)   Bipolar 1 disorder (HCC)   Noncompliance   PTSD (post-traumatic stress disorder)  Crohn's disease (acute on chronic) -Patient with acute flare of Crohn's disease most likely secondary noncompliance with medications, although patient states did administer his Simzia on 04/08/2018 - ED consulted Dr. Stacie Glaze GI who recommended CT Enterography which cannot be completed until the a.m. of 3/15 - Solu-Medrol 40 mg BID per GI - Patient counseled that once enterography obtained GI would discuss with him further plans.  Patient currently states does not want to stay more than 1 day secondary to school commitments. - Pain control.  Counseled patient that we would need to be judicious with narcotics as this could worsen his SBO but we would try to maintain his comfort (currently rates pain at 8/10). -Will require new GI physician upon discharge: See noncompliance  SBO - N.p.o -.  Currently not requiring decompression with NG tube  Bipolar 1 disorder/PTSD/Schizophrenic disorder - Patient not currently on medication for mental health issues.  Noncompliance with treatment -Noncompliant with medical treatment has been discharged from GI clinic February 2020 secondary to not attending scheduled appointments.  Tobacco abuse - Counseled on necessity to discontinue smoking.  Does not appear to be motivated to discontinue.  Drug abuse (marijuana) - 3/14 rapid drug screen positive marijuana    Code Status: Full (DVT Prophylaxis: Subcu heparin Family Communication: None Disposition Plan: Per GI    Data Reviewed: Care during the described time interval was  provided by me .  I have reviewed this patient's available data, including medical history, events of note, physical examination, and all test results as part of my evaluation.   Time spent: 60 min  [place], [facility] Pager [phone]

## 2018-04-17 NOTE — ED Notes (Signed)
Called lab to add on UDS.

## 2018-04-17 NOTE — ED Provider Notes (Addendum)
[facility] EMERGENCY DEPARTMENT Provider Note   CSN: [id number] Arrival date & time: 04/17/18  1731    History   Chief Complaint Chief Complaint  Patient presents with  . Abdominal Pain    HPI Jeffery Mcintosh is a 38 y.o. male.     HPI  Patient is a 38 year old male with a history of Crohn's disease, on Simzia (last dose 04/08/2018), bipolar disorder, GERD, hypertension presenting for lower abdominal pain, nausea, and vomiting.  Patient reports that for the past 2 weeks he has been struggling with what he describes as a "Crohn's flare".  He reports that he has lower abdominal cramping as well as 5 days 6 episodes of watery diarrhea today.  He denies seeing any melena or hematochezia.  He reports that he is still passing gas and has noticed no changes in his gas pattern.  He also reports 5 times emesis yesterday nonbilious and nonbloody.  Patient reports he is taken omeprazole for his pain without relief.  He also takes gabapentin for PTSD.  Patient reports that he had a couple leftover prednisone pills from last taper which she is taken.  Patient reports that he is not receiving any regular follow-up with GI due to being discharged from the [facility] GI practice.  He reports that he received a letter due to missed appointments.  Past Medical History:  Diagnosis Date  . Anxiety   . Arthritis   . Bipolar affective ([place])   . Chronic headaches   . Crohn's disease ([place]) history of  . Depression   . GERD (gastroesophageal reflux disease)   . Hypertension   . Schizophrenic disorder [facility])     Patient Active Problem List   Diagnosis Date Noted  . Crohn's disease of jejunum with intestinal obstruction ([place]) 10/26/2017  . SBO (small bowel obstruction) ([place]) 10/26/2017  . Bipolar 1 disorder ([place]) 10/26/2017  . Crohn's colitis, unspecified complication ([place]) 31/49/7026  . Epigastric burning sensation 01/03/2015  . Numbness and tingling in left arm 11/15/2014  .  Exacerbation of Crohn's disease ([place]) 10/21/2014  . Crohn's disease ([place]) 10/21/2014  . Diarrhea 10/21/2014  . Abdominal pain   . Gout 04/18/2013  . Cubital tunnel syndrome 04/18/2013  . Abnormal urinalysis 04/18/2013    Past Surgical History:  Procedure Laterality Date  . NO PAST SURGERIES          Home Medications    Prior to Admission medications   Medication Sig Start Date End Date Taking? Authorizing Provider  gabapentin (NEURONTIN) 300 MG capsule Take 300 mg by mouth 3 (three) times daily.    Yes [provider]  acetaminophen (TYLENOL) 325 MG tablet Take 2 tablets (650 mg total) by mouth every 6 (six) hours as needed. Patient not taking: Reported on 04/17/2018 01/18/18   Varney Biles, MD  omeprazole (PRILOSEC) 40 MG capsule Take 1 capsule (40 mg total) by mouth daily. Patient not taking: Reported on 01/18/2018 10/17/16   Nita Sells, MD  sucralfate (CARAFATE) 1 g tablet Take 1 tablet (1 g total) by mouth 4 (four) times daily -  with meals and at bedtime. Patient not taking: Reported on 04/17/2018 01/18/18   Varney Biles, MD    Family History Family History  Problem Relation Age of Onset  . Hypertension Mother   . Heart attack Mother   . Stroke Mother   . Alcohol abuse Father   . Mental illness Father   . Multiple sclerosis Father   . Arthritis Maternal Grandmother   .  Hypertension Maternal Grandmother   . Heart disease Maternal Grandmother   . Kidney disease Maternal Grandmother   . Diabetes Maternal Grandmother   . Cancer Maternal Grandmother        great, type unknown  . Hyperlipidemia Maternal Grandfather   . Colon polyps Maternal Grandfather   . Stroke Paternal Grandmother   . Ulcerative colitis Maternal Aunt     Social History Social History   Tobacco Use  . Smoking status: Current Every Day Smoker    Types: Cigars  . Smokeless tobacco: Never Used  Substance Use Topics  . Alcohol use: No    Alcohol/week: 0.0 standard drinks   . Drug use: No     Allergies   Patient has no known allergies.   Review of Systems Review of Systems  Constitutional: Negative for chills and fever.  HENT: Negative for congestion and sore throat.   Eyes: Negative for visual disturbance.  Respiratory: Negative for cough and chest tightness.   Cardiovascular: Negative for chest pain.  Gastrointestinal: Positive for abdominal pain, diarrhea, nausea and vomiting. Negative for constipation.  Genitourinary: Negative for dysuria and flank pain.  Musculoskeletal: Negative for back pain and myalgias.  Skin: Negative for rash.  Neurological: Negative for dizziness, syncope and light-headedness.     Physical Exam Updated Vital Signs BP 126/80   Pulse 75   Temp 98.2 F (36.8 C) (Oral)   Resp 14   Ht 5' 6"  (1.676 m)   Wt 80.7 kg   SpO2 97%   BMI 28.73 kg/m   Physical Exam Vitals signs and nursing note reviewed.  Constitutional:      General: He is not in acute distress.    Appearance: He is well-developed.  HENT:     Head: Normocephalic and atraumatic.  Eyes:     Conjunctiva/sclera: Conjunctivae normal.     Pupils: Pupils are equal, round, and reactive to light.  Neck:     Musculoskeletal: Normal range of motion and neck supple.  Cardiovascular:     Rate and Rhythm: Normal rate and regular rhythm.     Heart sounds: S1 normal and S2 normal. No murmur.  Pulmonary:     Effort: Pulmonary effort is normal.     Breath sounds: Normal breath sounds. No wheezing or rales.  Abdominal:     General: There is no distension.     Palpations: Abdomen is soft.     Tenderness: There is generalized abdominal tenderness. There is no guarding.     Comments: Patient has generalized abdominal comfort with emphasis in the lower abdomen.  No guarding.  Musculoskeletal: Normal range of motion.        General: No deformity.  Lymphadenopathy:     Cervical: No cervical adenopathy.  Skin:    General: Skin is warm and dry.     Findings: No  erythema or rash.  Neurological:     Mental Status: He is alert.     Comments: Cranial nerves grossly intact. Patient moves extremities symmetrically and with good coordination.  Psychiatric:        Behavior: Behavior normal.        Thought Content: Thought content normal.        Judgment: Judgment normal.      ED Treatments / Results  Labs (all labs ordered are listed, but only abnormal results are displayed) Labs Reviewed  URINALYSIS, ROUTINE W REFLEX MICROSCOPIC - Abnormal; Notable for the following components:      Result Value   Color,  Urine COLORLESS (*)    Specific Gravity, Urine 1.002 (*)    All other components within normal limits  LIPASE, BLOOD  COMPREHENSIVE METABOLIC PANEL  CBC    EKG None  Radiology Dg Abdomen Acute W/chest  Result Date: 04/17/2018 CLINICAL DATA:  Upper, central chest pain. Bilateral lower quadrant abdominal pain. Nausea and diarrhea for the past 2 weeks. Vomiting yesterday. History of Crohn's disease and hypertension. Smoker. EXAM: DG ABDOMEN ACUTE W/ 1V CHEST COMPARISON:  01/18/2018. Abdomen and pelvis CT dated 10/26/2017. FINDINGS: Normal sized heart. Clear lungs. Stable mild peribronchial thickening. Minimally dilated bowel loops in the mid abdomen with air-fluid levels. No free peritoneal air. Unremarkable bones. IMPRESSION: 1. Minimal partial small bowel obstruction. 2. Stable mild chronic bronchitic changes. Electronically Signed   By: Claudie Revering M.D.   On: 04/17/2018 20:03    Procedures Procedures (including critical care time)  Medications Ordered in ED Medications  sodium chloride flush (NS) 0.9 % injection 3 mL (3 mLs Intravenous Not Given 04/17/18 1938)  methylPREDNISolone sodium succinate (SOLU-MEDROL) 40 mg/mL injection 40 mg (has no administration in time range)  HYDROmorphone (DILAUDID) injection 1 mg (has no administration in time range)  sodium chloride 0.9 % bolus 1,000 mL (0 mLs Intravenous Stopped 04/17/18 2042)   pantoprazole (PROTONIX) injection 40 mg (40 mg Intravenous Given 04/17/18 1935)  sucralfate (CARAFATE) tablet 1 g (1 g Oral Given 04/17/18 1935)  ondansetron (ZOFRAN) injection 4 mg (4 mg Intravenous Given 04/17/18 1935)  HYDROmorphone (DILAUDID) injection 1 mg (1 mg Intravenous Given 04/17/18 1935)  promethazine (PHENERGAN) injection 25 mg (25 mg Intravenous Given 04/17/18 2044)     Initial Impression / Assessment and Plan / ED Course  I have reviewed the triage vital signs and the nursing notes.  Pertinent labs & imaging results that were available during my care of the patient were reviewed by me and considered in my medical decision making (see chart for details).  Clinical Course as of Apr 16 2136  Sat Apr 17, 2018  2111 Spoke with Dr. Paulita Fujita of gastroenterology who recommends CT enterography abdomen and pelvis.  He also recommends solumedrol 40 mg q12 hours.    [AM]  2136 Spoke with Dr. Clydene Laming of [facility] will admit patient.  Appreciate his involvement of the care of the patient.`   [AM]    Clinical Course User Index [AM] Albesa Seen, PA-C      This is a 38 year old male with past medical history of Crohn's disease, suboptimally managed currently due to his discharge from his gastroenterology practice.  He is still getting his infusion is not late on it.  He is presenting with 2 weeks of what he describes as a Crohn's flare.  Lab work is normal without evidence of leukocytosis or electrolyte abnormalities nor AKI.  He is hemodynamically stable.  Acute abdomen with chest demonstrates partial small bowel obstruction.  Case discussed with gastroenterology, Dr. Paulita Fujita, who recommends CT enterography will to be performed tomorrow.  In the meantime, will keep patient n.p.o., and administer antiemetics.  Patient updated on plan of care.  This is a shared visit with Dr. Shirlyn Goltz. Patient was independently evaluated by this attending physician. Attending physician consulted in  evaluation and admission management.  Final Clinical Impressions(s) / ED Diagnoses   Final diagnoses:  Crohn's disease of colon with intestinal obstruction [facility])    ED Discharge Orders    None       Albesa Seen, PA-C 04/17/18 2130  Tamala Julian 04/17/18 2138    Drenda Freeze, MD 04/17/18 [phone]

## 2018-04-17 NOTE — ED Notes (Signed)
Could not get CT scan because pt has to drink first, currently drinking

## 2018-04-17 NOTE — ED Triage Notes (Signed)
Pt c/o abdominal pain with nausea/vomiting/diarrhea x 2 weeks. Hx Crohns.

## 2018-04-17 NOTE — ED Notes (Signed)
ED TO INPATIENT HANDOFF REPORT  ED Nurse Name and Phone #: Almyra Free [phone]  S Name/Age/Gender Jeffery Mcintosh 38 y.o. male Room/Bed: 042C/042C  Code Status   Code Status: Full Code  Home/SNF/Other Home Patient oriented to: self, place, time and situation Is this baseline? Yes   Triage Complete: Triage complete  Chief Complaint Abdominal Pain  Triage Note Pt c/o abdominal pain with nausea/vomiting/diarrhea x 2 weeks. Hx Crohns.   Allergies No Known Allergies  Level of Care/Admitting Diagnosis ED Disposition    ED Disposition Condition [facility] Area: [place] [[id number]]  Level of Care: Med-Surg [16]  Diagnosis: SBO (small bowel obstruction) [facility]) [[id number]]  Admitting Physician: Allie Bossier [[id number]]  Attending Physician: Allie Bossier [[id number]]  Estimated length of stay: 5 - 7 days  Certification:: I certify this patient will need inpatient services for at least 2 midnights  PT Class (Do Not Modify): Inpatient [101]  PT Acc Code (Do Not Modify): Private [1]       B Medical/Surgery History Past Medical History:  Diagnosis Date  . Anxiety   . Arthritis   . Bipolar affective ([place])   . Chronic headaches   . Crohn's disease ([place]) history of  . Depression   . GERD (gastroesophageal reflux disease)   . Hypertension   . Noncompliance 04/17/2018  . Schizophrenic disorder [facility])    Past Surgical History:  Procedure Laterality Date  . NO PAST SURGERIES       A IV Location/Drains/Wounds Patient Lines/Drains/Airways Status   Active Line/Drains/Airways    Name:   Placement date:   Placement time:   Site:   Days:   Peripheral IV 04/17/18 Left Forearm   04/17/18    1934    Forearm   less than 1          Intake/Output Last 24 hours No intake or output data in the 24 hours ending 04/17/18 2207  Labs/Imaging Results for orders placed or performed during the hospital encounter of 04/17/18 (from the past 48 hour(s))   Lipase, blood     Status: None   Collection Time: 04/17/18  5:49 PM  Result Value Ref Range   Lipase 32 11 - 51 U/L    Comment: Performed at [facility], [place] [address] [place], [place] [address]  Comprehensive metabolic panel     Status: None   Collection Time: 04/17/18  5:49 PM  Result Value Ref Range   Sodium 141 135 - 145 mmol/L   Potassium 3.5 3.5 - 5.1 mmol/L   Chloride 106 98 - 111 mmol/L   CO2 24 22 - 32 mmol/L   Glucose, Bld 93 70 - 99 mg/dL   BUN 7 6 - 20 mg/dL   Creatinine, Ser 0.93 0.61 - 1.24 mg/dL   Calcium 8.9 8.9 - 10.3 mg/dL   Total Protein 6.7 6.5 - 8.1 g/dL   Albumin 3.6 3.5 - 5.0 g/dL   AST 15 15 - 41 U/L   ALT 16 0 - 44 U/L   Alkaline Phosphatase 82 38 - 126 U/L   Total Bilirubin 0.5 0.3 - 1.2 mg/dL   GFR calc non Af Amer >60 >60 mL/min   GFR calc Af Amer >60 >60 mL/min   Anion gap 11 5 - 15    Comment: Performed at [facility], [place] [address], [place], [place] [address]  CBC     Status: None   Collection Time: 04/17/18  5:49 PM  Result Value Ref Range   WBC 6.5 4.0 - 10.5 K/uL   RBC 5.15 4.22 - 5.81 MIL/uL   Hemoglobin 15.4 13.0 - 17.0 g/dL   HCT 45.7 39.0 - 52.0 %   MCV 88.7 80.0 - 100.0 fL   MCH 29.9 26.0 - 34.0 pg   MCHC 33.7 30.0 - 36.0 g/dL   RDW 13.5 11.5 - 15.5 %   Platelets 266 150 - 400 K/uL   nRBC 0.0 0.0 - 0.2 %    Comment: Performed at [facility], [place] [address], [place], [place] [address]  Urinalysis, Routine w reflex microscopic     Status: Abnormal   Collection Time: 04/17/18  6:08 PM  Result Value Ref Range   Color, Urine COLORLESS (A) YELLOW   APPearance CLEAR CLEAR   Specific Gravity, Urine 1.002 (L) 1.005 - 1.030   pH 7.0 5.0 - 8.0   Glucose, UA NEGATIVE NEGATIVE mg/dL   Hgb urine dipstick NEGATIVE NEGATIVE   Bilirubin Urine NEGATIVE NEGATIVE   Ketones, ur NEGATIVE NEGATIVE mg/dL   Protein, ur NEGATIVE NEGATIVE mg/dL   Nitrite NEGATIVE NEGATIVE   Leukocytes,Ua NEGATIVE NEGATIVE    Comment:  Performed at [place] [address], [place], [place] [address]   Dg Abdomen Acute W/chest  Result Date: 04/17/2018 CLINICAL DATA:  Upper, central chest pain. Bilateral lower quadrant abdominal pain. Nausea and diarrhea for the past 2 weeks. Vomiting yesterday. History of Crohn's disease and hypertension. Smoker. EXAM: DG ABDOMEN ACUTE W/ 1V CHEST COMPARISON:  01/18/2018. Abdomen and pelvis CT dated 10/26/2017. FINDINGS: Normal sized heart. Clear lungs. Stable mild peribronchial thickening. Minimally dilated bowel loops in the mid abdomen with air-fluid levels. No free peritoneal air. Unremarkable bones. IMPRESSION: 1. Minimal partial small bowel obstruction. 2. Stable mild chronic bronchitic changes. Electronically Signed   By: Claudie Revering M.D.   On: 04/17/2018 20:03    Pending Labs Unresulted Labs (From admission, onward)    Start     Ordered   04/18/18 1638  Basic metabolic panel  Tomorrow morning,   R     04/17/18 2150   04/18/18 0500  Magnesium  Tomorrow morning,   R     04/17/18 2150   04/18/18 0500  CBC WITH DIFFERENTIAL  Tomorrow morning,   R     04/17/18 2150   04/17/18 2146  Ethanol  ONCE - STAT,   R     04/17/18 2145   04/17/18 2146  CBC  (heparin)  Once,   R    Comments:  Baseline for heparin therapy IF NOT ALREADY DRAWN.  Notify MD if PLT < 100 K.    04/17/18 2150   04/17/18 2146  Creatinine, serum  (heparin)  Once,   R    Comments:  Baseline for heparin therapy IF NOT ALREADY DRAWN.    04/17/18 2150   04/17/18 2145  Urine rapid drug screen (hosp performed)  ONCE - STAT,   R     04/17/18 2145          Vitals/Pain Today's Vitals   04/17/18 1930 04/17/18 2043 04/17/18 2043 04/17/18 2146  BP: 126/80     Pulse: 75     Resp: 14     Temp:      TempSrc:      SpO2: 97%     Weight:      Height:      PainSc:  9  9  8  Isolation Precautions No active isolations  Medications Medications  sodium chloride flush (NS) 0.9 % injection 3 mL (3 mLs  Intravenous Not Given 04/17/18 1938)  methylPREDNISolone sodium succinate (SOLU-MEDROL) 40 mg/mL injection 40 mg (40 mg Intravenous Given 04/17/18 2141)  heparin injection 5,000 Units (has no administration in time range)  0.9 %  sodium chloride infusion (has no administration in time range)  ketorolac (TORADOL) 30 MG/ML injection 30 mg (has no administration in time range)  ondansetron (ZOFRAN) tablet 4 mg (has no administration in time range)    Or  ondansetron (ZOFRAN) injection 4 mg (has no administration in time range)  sodium chloride 0.9 % bolus 1,000 mL (0 mLs Intravenous Stopped 04/17/18 2042)  pantoprazole (PROTONIX) injection 40 mg (40 mg Intravenous Given 04/17/18 1935)  sucralfate (CARAFATE) tablet 1 g (1 g Oral Given 04/17/18 1935)  ondansetron (ZOFRAN) injection 4 mg (4 mg Intravenous Given 04/17/18 1935)  HYDROmorphone (DILAUDID) injection 1 mg (1 mg Intravenous Given 04/17/18 1935)  promethazine (PHENERGAN) injection 25 mg (25 mg Intravenous Given 04/17/18 2044)  morphine 4 MG/ML injection 4 mg (4 mg Intravenous Given 04/17/18 2141)    Mobility walks Low fall risk   Focused Assessments   R Recommendations: See Admitting Provider Note  Report given to:   Additional Notes:

## 2018-04-17 NOTE — ED Notes (Signed)
Patient transported to X-ray 

## 2018-04-18 ENCOUNTER — Inpatient Hospital Stay (HOSPITAL_COMMUNITY): Payer: Medicare Other

## 2018-04-18 ENCOUNTER — Encounter (HOSPITAL_COMMUNITY): Payer: Self-pay | Admitting: Radiology

## 2018-04-18 DIAGNOSIS — K50112 Crohn's disease of large intestine with intestinal obstruction: Secondary | ICD-10-CM

## 2018-04-18 DIAGNOSIS — R197 Diarrhea, unspecified: Secondary | ICD-10-CM

## 2018-04-18 LAB — BASIC METABOLIC PANEL
Anion gap: 11 (ref 5–15)
BUN: 5 mg/dL — ABNORMAL LOW (ref 6–20)
CO2: 25 mmol/L (ref 22–32)
Calcium: 8.6 mg/dL — ABNORMAL LOW (ref 8.9–10.3)
Chloride: 104 mmol/L (ref 98–111)
Creatinine, Ser: 1.09 mg/dL (ref 0.61–1.24)
GFR calc Af Amer: >60 mL/min (ref >60)
GFR calc non Af Amer: >60 mL/min (ref >60)
Glucose, Bld: 172 mg/dL — ABNORMAL HIGH (ref 70–99)
Potassium: 4.1 mmol/L (ref 3.5–5.1)
SODIUM: 140 mmol/L (ref 135–145)

## 2018-04-18 LAB — CBC WITH DIFFERENTIAL/PLATELET
Abs Immature Granulocytes: 0.03 10*3/uL (ref 0.00–0.07)
Basophils Absolute: 0 10*3/uL (ref 0.0–0.1)
Basophils Relative: 0 %
EOS PCT: 0 %
Eosinophils Absolute: 0 10*3/uL (ref 0.0–0.5)
HCT: 45 % (ref 39.0–52.0)
HEMOGLOBIN: 15 g/dL (ref 13.0–17.0)
Immature Granulocytes: 0 %
Lymphocytes Relative: 9 %
Lymphs Abs: 0.9 10*3/uL (ref 0.7–4.0)
MCH: 29.5 pg (ref 26.0–34.0)
MCHC: 33.3 g/dL (ref 30.0–36.0)
MCV: 88.6 fL (ref 80.0–100.0)
MONO ABS: 0.1 10*3/uL (ref 0.1–1.0)
Monocytes Relative: 1 %
Neutro Abs: 9.7 10*3/uL — ABNORMAL HIGH (ref 1.7–7.7)
Neutrophils Relative %: 90 %
Platelets: 268 10*3/uL (ref 150–400)
RBC: 5.08 MIL/uL (ref 4.22–5.81)
RDW: 13.5 % (ref 11.5–15.5)
WBC: 10.8 10*3/uL — ABNORMAL HIGH (ref 4.0–10.5)
nRBC: 0 % (ref 0.0–0.2)

## 2018-04-18 LAB — MAGNESIUM: Magnesium: 2 mg/dL (ref 1.7–2.4)

## 2018-04-18 MED ORDER — NICOTINE 21 MG/24HR TD PT24
21.0000 mg | MEDICATED_PATCH | Freq: Every day | TRANSDERMAL | Status: DC
  Administered 2018-04-18 – 2018-04-19 (×2): 21 mg via TRANSDERMAL
  Filled 2018-04-18 (×3): qty 1

## 2018-04-18 MED ORDER — GABAPENTIN 300 MG PO CAPS
300.0000 mg | ORAL_CAPSULE | Freq: Three times a day (TID) | ORAL | Status: DC
  Administered 2018-04-18 – 2018-04-20 (×3): 300 mg via ORAL
  Filled 2018-04-18 (×3): qty 1

## 2018-04-18 MED ORDER — IOHEXOL 300 MG/ML  SOLN
100.0000 mL | Freq: Once | INTRAMUSCULAR | Status: AC | PRN
  Administered 2018-04-18: 100 mL via INTRAVENOUS

## 2018-04-18 MED ORDER — GABAPENTIN 300 MG PO CAPS: 300.0000 mg | ORAL_CAPSULE | Freq: Three times a day (TID) | ORAL | Status: DC

## 2018-04-18 MED ORDER — HYDROMORPHONE HCL 1 MG/ML IJ SOLN
1.0000 mg | INTRAMUSCULAR | Status: DC | PRN
  Administered 2018-04-18 – 2018-04-20 (×10): 2 mg via INTRAVENOUS
  Filled 2018-04-18 (×10): qty 2

## 2018-04-18 NOTE — Consult Note (Signed)
[facility] Consultation Note  Referring Provider: [facility] Primary Care Physician:  Patient, No Pcp Per  Reason for Consultation:  Crohn's flare  HPI: Jeffery Mcintosh is a 38 y.o. male with history of 20+ years of Crohn's disease.  Patient has apparently seen many gastroenterologists and been on several agents (including adalimumab, infliximab and, most recently, certolizumab).  He has been admitted to hospital several times, most recently Fall 2019.  He has been on certolizumab; began having worsening lower abdominal pain and some nausea.  No troubles with stool or flatus or vomiting.  CT enterography showed terminal ileal thickening, with enteroenteric fistula, without bowel obstruction.  He has tolerated clear liquid diet; he is doing better with IV steroids since his admission yesterday.   Past Medical History:  Diagnosis Date  . Anxiety   . Arthritis   . Bipolar affective ([place])   . Chronic headaches   . Crohn's disease ([place]) history of  . Depression   . GERD (gastroesophageal reflux disease)   . Hypertension   . Noncompliance 04/17/2018  . PTSD (post-traumatic stress disorder) 04/17/2018  . Schizophrenic disorder [facility])     Past Surgical History:  Procedure Laterality Date  . NO PAST SURGERIES      Prior to Admission medications   Medication Sig Start Date End Date Taking? Authorizing Provider  gabapentin (NEURONTIN) 300 MG capsule Take 300 mg by mouth 3 (three) times daily.    Yes [provider]  acetaminophen (TYLENOL) 325 MG tablet Take 2 tablets (650 mg total) by mouth every 6 (six) hours as needed. Patient not taking: Reported on 04/17/2018 01/18/18   Varney Biles, MD  omeprazole (PRILOSEC) 40 MG capsule Take 1 capsule (40 mg total) by mouth daily. Patient not taking: Reported on 01/18/2018 10/17/16   Nita Sells, MD  sucralfate (CARAFATE) 1 g tablet Take 1 tablet (1 g total) by mouth 4 (four) times daily -  with meals and at  bedtime. Patient not taking: Reported on 04/17/2018 01/18/18   Varney Biles, MD    Current Facility-Administered Medications  Medication Dose Route Frequency Provider Last Rate Last Dose  . 0.9 %  sodium chloride infusion   Intravenous Continuous Nita Sells, MD 50 mL/hr at 04/18/18 1340    . heparin injection 5,000 Units  5,000 Units Subcutaneous Q8H Allie Bossier, MD   5,000 Units at 04/18/18 0009  . HYDROmorphone (DILAUDID) injection 1-2 mg  1-2 mg Intravenous Q4H PRN Nita Sells, MD   2 mg at 04/18/18 1332  . methylPREDNISolone sodium succinate (SOLU-MEDROL) 40 mg/mL injection 40 mg  40 mg Intravenous Q12H Murray, Alyssa B, PA-C   40 mg at 04/18/18 0916  . ondansetron (ZOFRAN) tablet 4 mg  4 mg Oral Q6H PRN Allie Bossier, MD       Or  . ondansetron [facility]) injection 4 mg  4 mg Intravenous Q6H PRN Allie Bossier, MD      . sodium chloride flush (NS) 0.9 % injection 3 mL  3 mL Intravenous Once Drenda Freeze, MD        Allergies as of 04/17/2018  . (No Known Allergies)    Family History  Problem Relation Age of Onset  . Hypertension Mother   . Heart attack Mother   . Stroke Mother   . Alcohol abuse Father   . Mental illness Father   . Multiple sclerosis Father   . Arthritis Maternal Grandmother   . Hypertension Maternal Grandmother   . Heart disease  Maternal Grandmother   . Kidney disease Maternal Grandmother   . Diabetes Maternal Grandmother   . Cancer Maternal Grandmother        great, type unknown  . Hyperlipidemia Maternal Grandfather   . Colon polyps Maternal Grandfather   . Stroke Paternal Grandmother   . Ulcerative colitis Maternal Aunt     Social History   Socioeconomic History  . Marital status: Single    Spouse name: Not on file  . Number of children: 2  . Years of education: 2  . Highest education level: Not on file  Occupational History  . Occupation: disabled    Comment: Crohn's / Bipolar / Schizophrenia  Social Needs   . Financial resource strain: Not on file  . Food insecurity:    Worry: Not on file    Inability: Not on file  . Transportation needs:    Medical: Not on file    Non-medical: Not on file  Tobacco Use  . Smoking status: Current Every Day Smoker    Types: Cigars  . Smokeless tobacco: Never Used  Substance and Sexual Activity  . Alcohol use: No    Alcohol/week: 0.0 standard drinks  . Drug use: No  . Sexual activity: Never    Birth control/protection: None  Lifestyle  . Physical activity:    Days per week: Not on file    Minutes per session: Not on file  . Stress: Not on file  Relationships  . Social connections:    Talks on phone: Not on file    Gets together: Not on file    Attends religious service: Not on file    Active member of club or organization: Not on file    Attends meetings of clubs or organizations: Not on file    Relationship status: Not on file  . Intimate partner violence:    Fear of current or ex partner: Not on file    Emotionally abused: Not on file    Physically abused: Not on file    Forced sexual activity: Not on file  Other Topics Concern  . Not on file  Social History Narrative  . Not on file    Review of Systems: As per HPI, all others negative  Physical Exam: Vital signs in last 24 hours: Temp:  [98 F (36.7 C)-98.4 F (36.9 C)] 98.3 F (36.8 C) (03/15 1501) Pulse Rate:  [68-79] 75 (03/15 1501) Resp:  [12-20] 17 (03/15 1501) BP: (126-153)/(77-90) 144/77 (03/15 1501) SpO2:  [95 %-100 %] 95 % (03/15 1501) Weight:  [77.1 kg-80.7 kg] 80.7 kg (03/14 1755) Last BM Date: 04/16/18 General:   Alert,  Well-developed, well-nourished, pleasant and cooperative in NAD Head:  Normocephalic and atraumatic. Eyes:  Sclera clear, no icterus.   Conjunctiva pink. Ears:  Normal auditory acuity. Nose:  No deformity, discharge,  or lesions. Mouth:  No deformity or lesions.  Oropharynx pink & moist. Neck:  Supple; no masses or thyromegaly. Lungs:  Clear  throughout to auscultation.   No wheezes, crackles, or rhonchi. No acute distress. Heart:  Regular rate and rhythm; no murmurs, clicks, rubs,  or gallops. Abdomen:  Soft, mildly protuberant, no significant distention. No masses, hepatosplenomegaly or hernias noted. Normal bowel sounds, without guarding, and without rebound.     Msk:  Symmetrical without gross deformities. Normal posture. Pulses:  Normal pulses noted. Extremities:  Without clubbing or edema. Neurologic:  Alert and  oriented x4;  grossly normal neurologically. Skin:  Intact without significant lesions or rashes.  Psych:  Alert and cooperative. Normal mood and affect.   Lab Results: Recent Labs    04/17/18 1749 04/17/18 2209 04/18/18 0400  WBC 6.5 6.4 10.8*  HGB 15.4 15.1 15.0  HCT 45.7 43.2 45.0  PLT 266 241 268   BMET Recent Labs    04/17/18 1749 04/17/18 2209 04/18/18 0400  NA 141  --  140  K 3.5  --  4.1  CL 106  --  104  CO2 24  --  25  GLUCOSE 93  --  172*  BUN 7  --  5*  CREATININE 0.93 0.94 1.09  CALCIUM 8.9  --  8.6*   LFT Recent Labs    04/17/18 1749  PROT 6.7  ALBUMIN 3.6  AST 15  ALT 16  ALKPHOS 82  BILITOT 0.5   PT/INR No results for input(s): LABPROT, INR in the last 72 hours.  Studies/Results: Ct Entero Abd/pelvis W Contast  Result Date: 04/18/2018 CLINICAL DATA:  Diffuse abdominal pain, intermittent diarrhea, history of Crohn's disease EXAM: CT ABDOMEN AND PELVIS WITH CONTRAST (ENTEROGRAPHY) TECHNIQUE: Multidetector CT of the abdomen and pelvis during bolus administration of intravenous contrast. Negative oral contrast was given. CONTRAST:  160m OMNIPAQUE IOHEXOL 300 MG/ML  SOLN COMPARISON:  10/26/2017 FINDINGS: Lower chest:  Lung bases are clear. Hepatobiliary: Liver is within normal limits. Gallbladder is unremarkable. No intrahepatic or extrahepatic ductal dilatation. Pancreas: Within normal limits. Spleen: Within normal limits. Adrenals/Urinary Tract: Adrenal glands are within  normal limits. Kidneys are within normal limits. No hydronephrosis. Bladder is within normal limits. Stomach/Bowel: Stomach is within normal limits. Persistent wall thickening involving the terminal ileum (series 3/image 51). Two adjacent loops of distal ileum in the right lower quadrant (series 3/image 53) continue to be adherent with suspected adhesions and patent enteroenteric fistula (series 3/image 50). No evidence of associated abscess. No evidence of bowel obstruction or stricture. No colonic wall thickening or inflammatory changes. Vascular/Lymphatic: No evidence of abdominal aortic aneurysm. No suspicious abdominopelvic lymphadenopathy. Reproductive: Prostate is unremarkable. Other: No abdominopelvic ascites.  No free air. Musculoskeletal: Visualized osseous structures are within normal limits. IMPRESSION: Active inflammatory Crohn's disease involving the terminal ileum with associated patent enteroenteric fistula with adjacent loop of distal ileum in the right lower quadrant, as described above. No evidence of bowel obstruction or stricture. No associated abscess or free air. Electronically Signed   By: SJulian HyM.D.   On: 04/18/2018 09:38   Dg Abdomen Acute W/chest  Result Date: 04/17/2018 CLINICAL DATA:  Upper, central chest pain. Bilateral lower quadrant abdominal pain. Nausea and diarrhea for the past 2 weeks. Vomiting yesterday. History of Crohn's disease and hypertension. Smoker. EXAM: DG ABDOMEN ACUTE W/ 1V CHEST COMPARISON:  01/18/2018. Abdomen and pelvis CT dated 10/26/2017. FINDINGS: Normal sized heart. Clear lungs. Stable mild peribronchial thickening. Minimally dilated bowel loops in the mid abdomen with air-fluid levels. No free peritoneal air. Unremarkable bones. IMPRESSION: 1. Minimal partial small bowel obstruction. 2. Stable mild chronic bronchitic changes. Electronically Signed   By: SClaudie ReveringM.D.   On: 04/17/2018 20:03    Impression:  1.  Acute Crohn's flare,  improving on IV steroids.  No clinical evidence of bowel obstruction. 2.  Abdominal pain, likely from #1 above, improving. 3.  Abnormal imaging, CT scan, active Crohn's disease of terminal ileum with associated enteroenteric fistula.  Plan:  1.  Advance diet to full liquids. 2.  Another day of IV steroids, consider transition to prednisone tomorrow. 3.  Patient likely needs  non anti-TNF agent in the not distant future; he appears to have failed or at least gone through infliximab, adalimumab, and certolizumab over the past several years. 4.  [facility] will follow.   LOS: 1 day   Erdem Naas M  04/18/2018, 4:21 PM  Cell [phone] If no answer or after 5 PM call [phone]

## 2018-04-18 NOTE — Plan of Care (Signed)
  Problem: Clinical Measurements: Goal: Ability to maintain clinical measurements within normal limits will improve Outcome: Progressing   Problem: Coping: Goal: Level of anxiety will decrease Outcome: Not Progressing   Problem: Pain Managment: Goal: General experience of comfort will improve Outcome: Progressing

## 2018-04-18 NOTE — Progress Notes (Signed)
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 and poor follow-up  Imaging confirms small bowel obstruction awaiting CT scan, further labs were relatively normal-note 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 as well as THC    A & Plan Crohn's disease with acute flare Enteroenteric fistula Started on Solu-Medrol 40 every 12, judicious pain medications Toradol 30 every 6 as needed and Dilaudid 1 to 2 mg every 4 as needed Await further input from gastroenterology Patient is hungry and as CT showed no obstruction, will place on clear liquids attendant input from GI SBO Soon on XR on admit-not confirmed on CT scan, so allow clear liquids as above Bipolar Not on meds Noncompliance Tells me hasn't followed up with GI because he still has pain and the steroids do no really work to resolve this Advised continued compliance Tobacco abuse Nursing reports he smelt of vape.  Advised we can help with nicotine patch if prn Drug abuse   DVT loveneox  Code Status: full  Communication: patient alone  Disposition Plan: inpatient pedning GI input    Anola Mcgough, MD  [facility] Via [employer] app OR [email or web address] 7PM-7AM contact night coverage as above 04/18/2018, 8:03 AM  LOS: 1 day   Consultants:  GI    Interval history/Subjective: Disgruntled Hungry In some pain not severe No fever no n/v  Objective:  Vitals:  Vitals:   04/17/18 2339 04/18/18 0558  BP: (!) 146/90 (!) 146/79  Pulse: 68 72  Resp: 20 18  Temp: 98 F (36.7 C) 98.4 F (36.9 C)  SpO2: 100% 95%    Exam:  eomi ncat no ict Flat affect s1 s 2no m/r/g abd soft no rebound no guard No le edema Neuro intact no focal deficit   I have personally reviewed the following:  DATA   Labs:  Wbc up from 6.4-->10  Glucose 172  Imaging studies: IMPRESSION: Active inflammatory Crohn's disease involving the terminal ileum with associated patent enteroenteric fistula with adjacent loop of distal ileum in the right lower quadrant, as described above.  No evidence of bowel obstruction or stricture. No associated abscess or free air.   Scheduled Meds: . heparin  5,000 Units Subcutaneous Q8H  . methylPREDNISolone (SOLU-MEDROL) injection  40 mg Intravenous Q12H  . sodium chloride flush  3 mL Intravenous Once   Continuous Infusions: . sodium chloride 100 mL/hr at 04/18/18 0021    Active Problems:   Crohn's disease ([place])   Diarrhea   Crohn's colitis, unspecified complication ([facility])   SBO (small bowel obstruction) ([facility])   Bipolar 1 disorder ([facility])   Noncompliance   PTSD (post-traumatic stress disorder)   Crohn's disease of colon with intestinal obstruction ([place])   LOS: 1 day

## 2018-04-18 NOTE — Progress Notes (Signed)
Pt. Removed IV tubing from his arm and placed it on the bed. When observed by RN the pt. Stated he "would hook it back up, he did not feel like pulling the IV pole around with him" Pt. Educated on risk of infection associated with accessing and de-accessing his IV site without proper aseptic technique. Pt. Agreed to allow RN to clean and replace IV tubing and fluids.   Pt. Became agitated with RN due to pain meds. Ordered stated Dilaudid was to be given if Toradol was ineffective. Pt. Refused Toradol. MD made aware and made adjustment to order. Dilaudid given.   Will continue to monitor.

## 2018-04-18 NOTE — Progress Notes (Signed)
Pt A&OX3 arrived from ED via wheel chair by tech. Pt denied N/V. Abd soft, BS active, + flatus. Pt was given dilaudid 47m for abd pain.  Pt voiced understanding not to eat or drink anything. MAEW.

## 2018-04-18 NOTE — Progress Notes (Signed)
Pt. Confessed to smoking a cherry black and mild in his room. Pt. Stated "I'm going to keep it real with you, I took 2 hits of a black and mild, I won't do it again". Pt. Educated on the fact that I would have to notify security and smoking on the premises is strictly prohibited. Will continue to monitor pt. Closely.

## 2018-04-18 NOTE — Progress Notes (Signed)
Strong, smoke-like odor coming from pt. Room. Asked pt. If he was vaping or smoking in his room and pt. Denied he was. Educated pt. On [facility]'s policy on No Smoking on the premises. Pt. Stated he "does not do any of that". Will continue to monitor.

## 2018-04-19 ENCOUNTER — Inpatient Hospital Stay (HOSPITAL_COMMUNITY): Payer: Medicare Other

## 2018-04-19 LAB — CBC WITH DIFFERENTIAL/PLATELET
Abs Immature Granulocytes: 0.08 10*3/uL — ABNORMAL HIGH (ref 0.00–0.07)
Basophils Absolute: 0 10*3/uL (ref 0.0–0.1)
Basophils Relative: 0 %
EOS ABS: 0 10*3/uL (ref 0.0–0.5)
Eosinophils Relative: 0 %
HCT: 45.5 % (ref 39.0–52.0)
Hemoglobin: 15.6 g/dL (ref 13.0–17.0)
Immature Granulocytes: 1 %
Lymphocytes Relative: 6 %
Lymphs Abs: 0.9 10*3/uL (ref 0.7–4.0)
MCH: 30 pg (ref 26.0–34.0)
MCHC: 34.3 g/dL (ref 30.0–36.0)
MCV: 87.5 fL (ref 80.0–100.0)
Monocytes Absolute: 0.7 10*3/uL (ref 0.1–1.0)
Monocytes Relative: 5 %
Neutro Abs: 12.4 10*3/uL — ABNORMAL HIGH (ref 1.7–7.7)
Neutrophils Relative %: 88 %
Platelets: 290 10*3/uL (ref 150–400)
RBC: 5.2 MIL/uL (ref 4.22–5.81)
RDW: 13.2 % (ref 11.5–15.5)
WBC: 14 10*3/uL — ABNORMAL HIGH (ref 4.0–10.5)
nRBC: 0 % (ref 0.0–0.2)

## 2018-04-19 LAB — COMPREHENSIVE METABOLIC PANEL
ALK PHOS: 104 U/L (ref 38–126)
ALT: 18 U/L (ref 0–44)
AST: 20 U/L (ref 15–41)
Albumin: 4 g/dL (ref 3.5–5.0)
Anion gap: 11 (ref 5–15)
BUN: 7 mg/dL (ref 6–20)
CO2: 27 mmol/L (ref 22–32)
Calcium: 9.5 mg/dL (ref 8.9–10.3)
Chloride: 101 mmol/L (ref 98–111)
Creatinine, Ser: 1.06 mg/dL (ref 0.61–1.24)
GFR calc Af Amer: >60 mL/min (ref >60)
GFR calc non Af Amer: >60 mL/min (ref >60)
Glucose, Bld: 135 mg/dL — ABNORMAL HIGH (ref 70–99)
Potassium: 4.2 mmol/L (ref 3.5–5.1)
Sodium: 139 mmol/L (ref 135–145)
Total Bilirubin: 0.6 mg/dL (ref 0.3–1.2)
Total Protein: 7.7 g/dL (ref 6.5–8.1)

## 2018-04-19 MED ORDER — PANTOPRAZOLE SODIUM 40 MG IV SOLR
40.0000 mg | Freq: Two times a day (BID) | INTRAVENOUS | Status: DC
  Administered 2018-04-19 – 2018-04-20 (×3): 40 mg via INTRAVENOUS
  Filled 2018-04-19 (×3): qty 40

## 2018-04-19 MED ORDER — HYDROMORPHONE HCL 1 MG/ML IJ SOLN: 2.0000 mg | Freq: Once | INTRAMUSCULAR | Status: AC

## 2018-04-19 MED ORDER — ALUM & MAG HYDROXIDE-SIMETH 200-200-20 MG/5ML PO SUSP
30.0000 mL | Freq: Four times a day (QID) | ORAL | Status: DC | PRN
  Administered 2018-04-19 – 2018-04-20 (×2): 30 mL via ORAL
  Filled 2018-04-19 (×2): qty 30

## 2018-04-19 MED ORDER — LORAZEPAM 2 MG/ML IJ SOLN
2.0000 mg | Freq: Once | INTRAMUSCULAR | Status: AC
  Administered 2018-04-19: 2 mg via INTRAVENOUS
  Filled 2018-04-19: qty 1

## 2018-04-19 MED ORDER — ALUM & MAG HYDROXIDE-SIMETH 200-200-20 MG/5ML PO SUSP
30.0000 mL | Freq: Once | ORAL | Status: AC
  Administered 2018-04-19: 30 mL via ORAL
  Filled 2018-04-19: qty 30

## 2018-04-19 MED ORDER — SUCRALFATE 1 G PO TABS
1.0000 g | ORAL_TABLET | Freq: Three times a day (TID) | ORAL | Status: DC
  Administered 2018-04-19: 1 g via ORAL
  Filled 2018-04-19: qty 1

## 2018-04-19 MED ORDER — PROMETHAZINE HCL 25 MG/ML IJ SOLN: 12.5000 mg | Freq: Four times a day (QID) | INTRAMUSCULAR | Status: DC | PRN

## 2018-04-19 NOTE — Progress Notes (Signed)
Successful NGT placement with ativan given. Awaiting xray to verify.

## 2018-04-19 NOTE — Consult Note (Signed)
[facility] Surgery Consult Note  Jeffery Mcintosh 1980-05-19  [id number].    Requesting MD: Verneita Griffes Chief Complaint/Reason for Consult: SBO  HPI:  Jeffery Mcintosh is a 37yo male PMH Crohn's disease (diagnosed age 56) who was admitted to [facility] 3/14 complaining of 2 weeks of worsening abdominal pain. States that he has seen multiple gastroenterologists and been on multiple different medications in the past, most recently he was being followed by Dr. Michail Sermon and was on Cimzia earlier this month. Last Crohns flare up prior to this admission was in 10/2017. He had not been on any steroids in several weeks prior to this admission.  States that 2 weeks ago he started having intermittent lower abdominal pain and some nausea. When his pain became constant and severe he came into the ED. CT enterography 3/15 showed active inflammatory Crohn's disease involving the terminal ileum with associated patent enteroenteric fistula with adjacent loop of distal ileum in the right lower quadrant. Patient was admitted and started on IV steroids. Initially started feeling somewhat better and was tolerating a liquid diet, but early this morning he began experiencing nausea, vomiting, and abdominal distension. He did have a small BM yesterday, and is passing some flatus today. Xray this AM showed new gaseous distention of small bowel most consistent with obstruction. General surgery asked to see.  -Abdominal surgical history: none -Last colonoscopy 2016 by Dr. Fuller Plan showed abnormal mucosa and deformity at the ileocecal valve and cecum, pathology reports quiescent colitis -Anticoagulants: none -Smokes ~3 cigars or black and milds daily -Currently taking online classes, works temp jobs periodically  ROS: Review of Systems  Constitutional: Negative.   HENT: Negative.   Eyes: Negative.   Respiratory: Negative.   Cardiovascular: Negative.   Gastrointestinal: Positive for abdominal pain, constipation,  diarrhea, nausea and vomiting.  Genitourinary: Negative.   Musculoskeletal: Negative.   Skin: Negative.   Neurological: Negative.     All systems reviewed and otherwise negative except for as above  Family History  Problem Relation Age of Onset  . Hypertension Mother   . Heart attack Mother   . Stroke Mother   . Alcohol abuse Father   . Mental illness Father   . Multiple sclerosis Father   . Arthritis Maternal Grandmother   . Hypertension Maternal Grandmother   . Heart disease Maternal Grandmother   . Kidney disease Maternal Grandmother   . Diabetes Maternal Grandmother   . Cancer Maternal Grandmother        great, type unknown  . Hyperlipidemia Maternal Grandfather   . Colon polyps Maternal Grandfather   . Stroke Paternal Grandmother   . Ulcerative colitis Maternal Aunt     Past Medical History:  Diagnosis Date  . Anxiety   . Arthritis   . Bipolar affective ([place])   . Chronic headaches   . Crohn's disease ([place]) history of  . Depression   . GERD (gastroesophageal reflux disease)   . Hypertension   . Noncompliance 04/17/2018  . PTSD (post-traumatic stress disorder) 04/17/2018  . Schizophrenic disorder [facility])     Past Surgical History:  Procedure Laterality Date  . NO PAST SURGERIES      Social History:  reports that he has been smoking cigars. He has never used smokeless tobacco. He reports that he does not drink alcohol or use drugs.  Allergies: No Known Allergies  Medications Prior to Admission  Medication Sig Dispense Refill  . gabapentin (NEURONTIN) 300 MG capsule Take 300 mg by mouth 3 (three) times daily.     Marland Kitchen  acetaminophen (TYLENOL) 325 MG tablet Take 2 tablets (650 mg total) by mouth every 6 (six) hours as needed. (Patient not taking: Reported on 04/17/2018) 30 tablet 0  . omeprazole (PRILOSEC) 40 MG capsule Take 1 capsule (40 mg total) by mouth daily. (Patient not taking: Reported on 01/18/2018) 30 capsule 3  . sucralfate (CARAFATE) 1 g tablet Take 1  tablet (1 g total) by mouth 4 (four) times daily -  with meals and at bedtime. (Patient not taking: Reported on 04/17/2018) 30 tablet 0    Prior to Admission medications   Medication Sig Start Date End Date Taking? Authorizing Provider  gabapentin (NEURONTIN) 300 MG capsule Take 300 mg by mouth 3 (three) times daily.    Yes [provider]  acetaminophen (TYLENOL) 325 MG tablet Take 2 tablets (650 mg total) by mouth every 6 (six) hours as needed. Patient not taking: Reported on 04/17/2018 01/18/18   Varney Biles, MD  omeprazole (PRILOSEC) 40 MG capsule Take 1 capsule (40 mg total) by mouth daily. Patient not taking: Reported on 01/18/2018 10/17/16   Nita Sells, MD  sucralfate (CARAFATE) 1 g tablet Take 1 tablet (1 g total) by mouth 4 (four) times daily -  with meals and at bedtime. Patient not taking: Reported on 04/17/2018 01/18/18   Varney Biles, MD    Blood pressure (!) 157/90, pulse (!) 58, temperature 98.1 F (36.7 C), temperature source Oral, resp. rate 18, height 5' 6"  (1.676 m), weight 80.7 kg, SpO2 100 %. Physical Exam: General: pleasant, WD/WN AA male who is laying in bed in NAD HEENT: head is normocephalic, atraumatic.  Sclera are noninjected.  Pupils equal and round.  Ears and nose without any masses or lesions.  Mouth is pink and moist. Dentition fair Heart: regular, rate, and rhythm.  No obvious murmurs, gallops, or rubs noted.  Palpable pedal pulses bilaterally Lungs: CTAB, no wheezes, rhonchi, or rales noted.  Respiratory effort nonlabored Abd: distended, somewhat firm, no masses, hernias, or organomegaly. +BS. Mild TTP epigastric and suprapubic regions without rebound or guarding MS: all 4 extremities are symmetrical with no cyanosis, clubbing, or edema. Skin: warm and dry with no masses, lesions, or rashes Psych: A&Ox3 with an appropriate affect. Neuro: cranial nerves grossly intact, extremity CSM intact bilaterally, normal speech  Results for orders  placed or performed during the hospital encounter of 04/17/18 (from the past 48 hour(s))  Lipase, blood     Status: None   Collection Time: 04/17/18  5:49 PM  Result Value Ref Range   Lipase 32 11 - 51 U/L    Comment: Performed at [facility], [place] [address], [place], [place] [address]  Comprehensive metabolic panel     Status: None   Collection Time: 04/17/18  5:49 PM  Result Value Ref Range   Sodium 141 135 - 145 mmol/L   Potassium 3.5 3.5 - 5.1 mmol/L   Chloride 106 98 - 111 mmol/L   CO2 24 22 - 32 mmol/L   Glucose, Bld 93 70 - 99 mg/dL   BUN 7 6 - 20 mg/dL   Creatinine, Ser 0.93 0.61 - 1.24 mg/dL   Calcium 8.9 8.9 - 10.3 mg/dL   Total Protein 6.7 6.5 - 8.1 g/dL   Albumin 3.6 3.5 - 5.0 g/dL   AST 15 15 - 41 U/L   ALT 16 0 - 44 U/L   Alkaline Phosphatase 82 38 - 126 U/L   Total Bilirubin 0.5 0.3 - 1.2 mg/dL   GFR  calc non Af Amer >60 >60 mL/min   GFR calc Af Amer >60 >60 mL/min   Anion gap 11 5 - 15    Comment: Performed at [facility] [address] [place], [place] [address]  CBC     Status: None   Collection Time: 04/17/18  5:49 PM  Result Value Ref Range   WBC 6.5 4.0 - 10.5 K/uL   RBC 5.15 4.22 - 5.81 MIL/uL   Hemoglobin 15.4 13.0 - 17.0 g/dL   HCT 45.7 39.0 - 52.0 %   MCV 88.7 80.0 - 100.0 fL   MCH 29.9 26.0 - 34.0 pg   MCHC 33.7 30.0 - 36.0 g/dL   RDW 13.5 11.5 - 15.5 %   Platelets 266 150 - 400 K/uL   nRBC 0.0 0.0 - 0.2 %    Comment: Performed at [facility], [place] [address] [place], [place] [address]  Urinalysis, Routine w reflex microscopic     Status: Abnormal   Collection Time: 04/17/18  6:08 PM  Result Value Ref Range   Color, Urine COLORLESS (A) YELLOW   APPearance CLEAR CLEAR   Specific Gravity, Urine 1.002 (L) 1.005 - 1.030   pH 7.0 5.0 - 8.0   Glucose, UA NEGATIVE NEGATIVE mg/dL   Hgb urine dipstick NEGATIVE NEGATIVE   Bilirubin Urine NEGATIVE NEGATIVE   Ketones, ur NEGATIVE NEGATIVE mg/dL   Protein, ur NEGATIVE NEGATIVE  mg/dL   Nitrite NEGATIVE NEGATIVE   Leukocytes,Ua NEGATIVE NEGATIVE    Comment: Performed at [facility] [address], [place], [place] [address]  Urine rapid drug screen (hosp performed)     Status: Abnormal   Collection Time: 04/17/18  6:08 PM  Result Value Ref Range   Opiates NONE DETECTED NONE DETECTED   Cocaine NONE DETECTED NONE DETECTED   Benzodiazepines NONE DETECTED NONE DETECTED   Amphetamines NONE DETECTED NONE DETECTED   Tetrahydrocannabinol POSITIVE (A) NONE DETECTED   Barbiturates POSITIVE (A) NONE DETECTED    Comment: (NOTE) DRUG SCREEN FOR MEDICAL PURPOSES ONLY.  IF CONFIRMATION IS NEEDED FOR ANY PURPOSE, NOTIFY LAB WITHIN 5 DAYS. LOWEST DETECTABLE LIMITS FOR URINE DRUG SCREEN Drug Class                     Cutoff (ng/mL) Amphetamine and metabolites    1000 Barbiturate and metabolites    200 Benzodiazepine                 619 Tricyclics and metabolites     300 Opiates and metabolites        300 Cocaine and metabolites        300 THC                            50 Performed at [facility], [place] [address], [place],  [address]   Ethanol     Status: None   Collection Time: 04/17/18 10:09 PM  Result Value Ref Range   Alcohol, Ethyl (B) <10 <10 mg/dL    Comment: (NOTE) Lowest detectable limit for serum alcohol is 10 mg/dL. For medical purposes only. Performed at [facility], [place] [address] [place] [address]   CBC     Status: None   Collection Time: 04/17/18 10:09 PM  Result Value Ref Range   WBC 6.4 4.0 - 10.5 K/uL   RBC 4.91 4.22 - 5.81 MIL/uL  Hemoglobin 15.1 13.0 - 17.0 g/dL   HCT 43.2 39.0 - 52.0 %   MCV 88.0 80.0 - 100.0 fL   MCH 30.8 26.0 - 34.0 pg   MCHC 35.0 30.0 - 36.0 g/dL   RDW 13.6 11.5 - 15.5 %   Platelets 241 150 - 400 K/uL   nRBC 0.0 0.0 - 0.2 %    Comment: Performed at [facility], [place] [address], [place], [place] [address]  Creatinine, serum     Status: None   Collection Time: 04/17/18  10:09 PM  Result Value Ref Range   Creatinine, Ser 0.94 0.61 - 1.24 mg/dL   GFR calc non Af Amer >60 >60 mL/min   GFR calc Af Amer >60 >60 mL/min    Comment: Performed at [facility] [address], [place], [place] [address]  Basic metabolic panel     Status: Abnormal   Collection Time: 04/18/18  4:00 AM  Result Value Ref Range   Sodium 140 135 - 145 mmol/L   Potassium 4.1 3.5 - 5.1 mmol/L   Chloride 104 98 - 111 mmol/L   CO2 25 22 - 32 mmol/L   Glucose, Bld 172 (H) 70 - 99 mg/dL   BUN 5 (L) 6 - 20 mg/dL   Creatinine, Ser 1.09 0.61 - 1.24 mg/dL   Calcium 8.6 (L) 8.9 - 10.3 mg/dL   GFR calc non Af Amer >60 >60 mL/min   GFR calc Af Amer >60 >60 mL/min   Anion gap 11 5 - 15    Comment: Performed at [facility], [place] [address], [place], [place] [address]  Magnesium     Status: None   Collection Time: 04/18/18  4:00 AM  Result Value Ref Range   Magnesium 2.0 1.7 - 2.4 mg/dL    Comment: Performed at [facility] [address] [place], [place] [address]  CBC WITH DIFFERENTIAL     Status: Abnormal   Collection Time: 04/18/18  4:00 AM  Result Value Ref Range   WBC 10.8 (H) 4.0 - 10.5 K/uL    Comment: REPEATED TO VERIFY   RBC 5.08 4.22 - 5.81 MIL/uL   Hemoglobin 15.0 13.0 - 17.0 g/dL   HCT 45.0 39.0 - 52.0 %   MCV 88.6 80.0 - 100.0 fL   MCH 29.5 26.0 - 34.0 pg   MCHC 33.3 30.0 - 36.0 g/dL   RDW 13.5 11.5 - 15.5 %   Platelets 268 150 - 400 K/uL   nRBC 0.0 0.0 - 0.2 %   Neutrophils Relative % 90 %   Neutro Abs 9.7 (H) 1.7 - 7.7 K/uL   Lymphocytes Relative 9 %   Lymphs Abs 0.9 0.7 - 4.0 K/uL   Monocytes Relative 1 %   Monocytes Absolute 0.1 0.1 - 1.0 K/uL   Eosinophils Relative 0 %   Eosinophils Absolute 0.0 0.0 - 0.5 K/uL   Basophils Relative 0 %   Basophils Absolute 0.0 0.0 - 0.1 K/uL   Immature Granulocytes 0 %   Abs Immature Granulocytes 0.03 0.00 - 0.07 K/uL    Comment: Performed at [facility] [address] [place], [place] [address]   Comprehensive metabolic panel     Status: Abnormal   Collection Time: 04/19/18  3:53 AM  Result Value Ref Range   Sodium 139 135 - 145 mmol/L   Potassium 4.2 3.5 - 5.1 mmol/L   Chloride 101 98 - 111 mmol/L   CO2  27 22 - 32 mmol/L   Glucose, Bld 135 (H) 70 - 99 mg/dL   BUN 7 6 - 20 mg/dL   Creatinine, Ser 1.06 0.61 - 1.24 mg/dL   Calcium 9.5 8.9 - 10.3 mg/dL   Total Protein 7.7 6.5 - 8.1 g/dL   Albumin 4.0 3.5 - 5.0 g/dL   AST 20 15 - 41 U/L   ALT 18 0 - 44 U/L   Alkaline Phosphatase 104 38 - 126 U/L   Total Bilirubin 0.6 0.3 - 1.2 mg/dL   GFR calc non Af Amer >60 >60 mL/min   GFR calc Af Amer >60 >60 mL/min   Anion gap 11 5 - 15    Comment: Performed at [facility], [place] [address] [place], [place] [address]  CBC with Differential/Platelet     Status: Abnormal   Collection Time: 04/19/18  3:53 AM  Result Value Ref Range   WBC 14.0 (H) 4.0 - 10.5 K/uL   RBC 5.20 4.22 - 5.81 MIL/uL   Hemoglobin 15.6 13.0 - 17.0 g/dL   HCT 45.5 39.0 - 52.0 %   MCV 87.5 80.0 - 100.0 fL   MCH 30.0 26.0 - 34.0 pg   MCHC 34.3 30.0 - 36.0 g/dL   RDW 13.2 11.5 - 15.5 %   Platelets 290 150 - 400 K/uL   nRBC 0.0 0.0 - 0.2 %   Neutrophils Relative % 88 %   Neutro Abs 12.4 (H) 1.7 - 7.7 K/uL   Lymphocytes Relative 6 %   Lymphs Abs 0.9 0.7 - 4.0 K/uL   Monocytes Relative 5 %   Monocytes Absolute 0.7 0.1 - 1.0 K/uL   Eosinophils Relative 0 %   Eosinophils Absolute 0.0 0.0 - 0.5 K/uL   Basophils Relative 0 %   Basophils Absolute 0.0 0.0 - 0.1 K/uL   Immature Granulocytes 1 %   Abs Immature Granulocytes 0.08 (H) 0.00 - 0.07 K/uL    Comment: Performed at [facility] [address], [place], [place] [address]   Dg Abd 1 View  Result Date: 04/19/2018 CLINICAL DATA:  Abdominal distension for 2 weeks in a patient with a history of Crohn's disease. EXAM: ABDOMEN - 1 VIEW COMPARISON:  CT abdomen and pelvis 04/18/2018 and chest and two views abdomen 04/17/2018. FINDINGS: There is gaseous  distention of small bowel with loops measuring up to approximately 4 cm. There is some stool in the ascending colon. No abnormal abdominal calcification. No focal bony abnormality. IMPRESSION: New gaseous distention of small bowel most consistent with obstruction. Electronically Signed   By: Inge Rise M.D.   On: 04/19/2018 11:01   Ct Entero Abd/pelvis W Contast  Result Date: 04/18/2018 CLINICAL DATA:  Diffuse abdominal pain, intermittent diarrhea, history of Crohn's disease EXAM: CT ABDOMEN AND PELVIS WITH CONTRAST (ENTEROGRAPHY) TECHNIQUE: Multidetector CT of the abdomen and pelvis during bolus administration of intravenous contrast. Negative oral contrast was given. CONTRAST:  134m OMNIPAQUE IOHEXOL 300 MG/ML  SOLN COMPARISON:  10/26/2017 FINDINGS: Lower chest:  Lung bases are clear. Hepatobiliary: Liver is within normal limits. Gallbladder is unremarkable. No intrahepatic or extrahepatic ductal dilatation. Pancreas: Within normal limits. Spleen: Within normal limits. Adrenals/Urinary Tract: Adrenal glands are within normal limits. Kidneys are within normal limits. No hydronephrosis. Bladder is within normal limits. Stomach/Bowel: Stomach is within normal limits. Persistent wall thickening involving the terminal ileum (series 3/image 51). Two adjacent loops of distal ileum in the right lower quadrant (series 3/image 53) continue to be  adherent with suspected adhesions and patent enteroenteric fistula (series 3/image 50). No evidence of associated abscess. No evidence of bowel obstruction or stricture. No colonic wall thickening or inflammatory changes. Vascular/Lymphatic: No evidence of abdominal aortic aneurysm. No suspicious abdominopelvic lymphadenopathy. Reproductive: Prostate is unremarkable. Other: No abdominopelvic ascites.  No free air. Musculoskeletal: Visualized osseous structures are within normal limits. IMPRESSION: Active inflammatory Crohn's disease involving the terminal ileum with  associated patent enteroenteric fistula with adjacent loop of distal ileum in the right lower quadrant, as described above. No evidence of bowel obstruction or stricture. No associated abscess or free air. Electronically Signed   By: Julian Hy M.D.   On: 04/18/2018 09:38   Dg Abdomen Acute W/chest  Result Date: 04/17/2018 CLINICAL DATA:  Upper, central chest pain. Bilateral lower quadrant abdominal pain. Nausea and diarrhea for the past 2 weeks. Vomiting yesterday. History of Crohn's disease and hypertension. Smoker. EXAM: DG ABDOMEN ACUTE W/ 1V CHEST COMPARISON:  01/18/2018. Abdomen and pelvis CT dated 10/26/2017. FINDINGS: Normal sized heart. Clear lungs. Stable mild peribronchial thickening. Minimally dilated bowel loops in the mid abdomen with air-fluid levels. No free peritoneal air. Unremarkable bones. IMPRESSION: 1. Minimal partial small bowel obstruction. 2. Stable mild chronic bronchitic changes. Electronically Signed   By: Claudie Revering M.D.   On: 04/17/2018 20:03   Anti-infectives (From admission, onward)   None        Assessment/Plan Bipolar, PTSD GERD Tobacco abuse  Active inflammatory Crohn's disease, known enteroenteric fistula SBO - Patient with Crohn's disease since age 49, recently followed by Dr. Michail Sermon and was receiving Cimzia injections. Admitted 3/14 with an acute Crohn's flare up, GI following. He developed nausea and vomiting this morning, xray findings suspicious for SBO. Would like to place NG tube and start patient on small bowel protocol. Due to h/o PTSD he is having difficulty having NG tube placed. Will order 1 dose IV ativan to use prior to NG tube placement. If unable to get NG tube placed will just keep him NPO/bowel rest. Repeat abdominal film in the AM.  ID - none VTE - SCDs, sq heparin  FEN - IVF, NPO Foley - none Follow up - TBD  [place], [facility] Surgery 04/19/2018, 1:59 PM Pager: [phone] Mon-Thurs 7:00 am-4:30  pm Fri 7:00 am -11:30 AM Sat-Sun 7:00 am-11:30 am

## 2018-04-19 NOTE — Progress Notes (Signed)
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  Adding Protonix IV 40 bid and carafate 1 gm bid.  Abd slightly distended.  Image if recurs with AXR SBO not confirmed on CT scan, so allow clear liquids as above Leukocytosis Increase in WBC corresponds with start of steroids--no fever--only monitor at this stage Bipolar Not on meds Noncompliance Tells me hasn't followed up with GI because he still has pain and the steroids do no really work to resolve this Advised continued compliance Tobacco abuse Advised not to smoke in hopsital Drug abuse   DVT loveneox  Code Status: full  Communication: patient alone  Disposition Plan: inpatient pedning GI input    [place], MD  [facility] [address] [place] [email or web address] 7PM-7AM contact night coverage as above 04/19/2018, 8:17 AM  LOS: 2 days   Consultants:  GI    Interval history/Subjective:  Sever epain  epsidoes of non bloody emesis No  cp Some diarr yesterday Didn't sleep well  Objective:  Vitals:  Vitals:   04/18/18 2216 04/19/18 0549  BP: (!) 152/91 (!) 156/95  Pulse: 66 68  Resp: 18 19  Temp:  98.2 F (36.8 C)  SpO2: 98% 96%    Exam:  eomi ncat no ict no pallor s1 s 2no m/r/g abd slight distended with mild tender in epig, no reboudn no gaurd No le edema Neuro intact no focal deficit   I have personally reviewed the following:  DATA   Labs:  Wbc up from 6.4-->10-->14 Glucose 176  Imaging studies: IMPRESSION: Active inflammatory Crohn's disease involving the terminal ileum with associated patent enteroenteric fistula with adjacent loop of distal ileum in the right lower quadrant, as described above.  No evidence of bowel obstruction or stricture. No associated abscess or free air.   Scheduled Meds: . gabapentin  300 mg Oral TID  . heparin  5,000 Units Subcutaneous Q8H  . methylPREDNISolone (SOLU-MEDROL) injection  40 mg Intravenous Q12H  . nicotine  21 mg Transdermal Daily  . sodium chloride flush  3 mL Intravenous Once   Continuous Infusions: . sodium chloride 50 mL/hr at 04/19/18 0805    Active Problems:   Crohn's disease ([place])   Diarrhea   Crohn's colitis, unspecified complication (HCC)   SBO (small bowel obstruction) (HCC)   Bipolar 1 disorder (HCC)   Noncompliance   PTSD (post-traumatic stress disorder)   Crohn's disease of colon with intestinal obstruction ([place])   LOS: 2 days

## 2018-04-19 NOTE — Progress Notes (Signed)
Notified Dr. Verlon Au of Abd film xray, orders received for NG tube.

## 2018-04-19 NOTE — Progress Notes (Signed)
NGT to low intermittent suction with 1000cc immediate brown drainage out.

## 2018-04-19 NOTE — Progress Notes (Signed)
Inserted NGT about 1/2 way and pt had panic attack, stating he felt like the tube was going up into his brain.  I explained rationale of having it but his anxiety level was too high and he made me take it out.  [place], [place], CCS in to talk to pt.  Dr. Verlon Au notified about NGT, will try ativan and reinsert.

## 2018-04-19 NOTE — Progress Notes (Addendum)
Subjective: The patient was seen and examined at bedside. C/o of nausea, vomiting and ongoing abdominal pain since 1 AM today morning.   Objective: Vital signs in last 24 hours: Temp:  [98.2 F (36.8 C)-98.3 F (36.8 C)] 98.2 F (36.8 C) (03/16 0549) Pulse Rate:  [66-75] 68 (03/16 0549) Resp:  [17-19] 19 (03/16 0549) BP: (144-156)/(77-95) 156/95 (03/16 0549) SpO2:  [95 %-98 %] 96 % (03/16 0549) Weight change:  Last BM Date: 04/16/18  PE:in distress from pain GENERAL:no pallor, no icterus ABDOMEN:distended, tender upper epigastric area, bowel sounds not audible EXTREMITIES:no deformity  Lab Results: Results for orders placed or performed during the hospital encounter of 04/17/18 (from the past 48 hour(s))  Lipase, blood     Status: None   Collection Time: 04/17/18  5:49 PM  Result Value Ref Range   Lipase 32 11 - 51 U/L    Comment: Performed at [facility], [place] [address] [place], [place] [address]  Comprehensive metabolic panel     Status: None   Collection Time: 04/17/18  5:49 PM  Result Value Ref Range   Sodium 141 135 - 145 mmol/L   Potassium 3.5 3.5 - 5.1 mmol/L   Chloride 106 98 - 111 mmol/L   CO2 24 22 - 32 mmol/L   Glucose, Bld 93 70 - 99 mg/dL   BUN 7 6 - 20 mg/dL   Creatinine, Ser 0.93 0.61 - 1.24 mg/dL   Calcium 8.9 8.9 - 10.3 mg/dL   Total Protein 6.7 6.5 - 8.1 g/dL   Albumin 3.6 3.5 - 5.0 g/dL   AST 15 15 - 41 U/L   ALT 16 0 - 44 U/L   Alkaline Phosphatase 82 38 - 126 U/L   Total Bilirubin 0.5 0.3 - 1.2 mg/dL   GFR calc non Af Amer >60 >60 mL/min   GFR calc Af Amer >60 >60 mL/min   Anion gap 11 5 - 15    Comment: Performed at [facility], [place] [address], [place], [place] [address]  CBC     Status: None   Collection Time: 04/17/18  5:49 PM  Result Value Ref Range   WBC 6.5 4.0 - 10.5 K/uL   RBC 5.15 4.22 - 5.81 MIL/uL   Hemoglobin 15.4 13.0 - 17.0 g/dL   HCT 45.7 39.0 - 52.0 %   MCV 88.7 80.0 - 100.0 fL   MCH 29.9 26.0 - 34.0 pg   MCHC 33.7 30.0 - 36.0 g/dL   RDW 13.5 11.5 - 15.5 %   Platelets 266 150 - 400 K/uL   nRBC 0.0 0.0 - 0.2 %    Comment: Performed at [facility], [place] [address], [place], [place] [address]  Urinalysis, Routine w reflex microscopic     Status: Abnormal   Collection Time: 04/17/18  6:08 PM  Result Value Ref Range   Color, Urine COLORLESS (A) YELLOW   APPearance CLEAR CLEAR   Specific Gravity, Urine 1.002 (L) 1.005 - 1.030   pH 7.0 5.0 - 8.0   Glucose, UA NEGATIVE NEGATIVE mg/dL   Hgb urine dipstick NEGATIVE NEGATIVE   Bilirubin Urine NEGATIVE NEGATIVE   Ketones, ur NEGATIVE NEGATIVE mg/dL   Protein, ur NEGATIVE NEGATIVE mg/dL   Nitrite NEGATIVE NEGATIVE   Leukocytes,Ua NEGATIVE NEGATIVE    Comment: Performed at [facility] [address], [place], [place] [address]  Urine rapid drug screen (hosp performed)     Status: Abnormal   Collection  Time: 04/17/18  6:08 PM  Result Value Ref Range   Opiates NONE DETECTED NONE DETECTED   Cocaine NONE DETECTED NONE DETECTED   Benzodiazepines NONE DETECTED NONE DETECTED   Amphetamines NONE DETECTED NONE DETECTED   Tetrahydrocannabinol POSITIVE (A) NONE DETECTED   Barbiturates POSITIVE (A) NONE DETECTED    Comment: (NOTE) DRUG SCREEN FOR MEDICAL PURPOSES ONLY.  IF CONFIRMATION IS NEEDED FOR ANY PURPOSE, NOTIFY LAB WITHIN 5 DAYS. LOWEST DETECTABLE LIMITS FOR URINE DRUG SCREEN Drug Class                     Cutoff (ng/mL) Amphetamine and metabolites    1000 Barbiturate and metabolites    200 Benzodiazepine                 284 Tricyclics and metabolites     300 Opiates and metabolites        300 Cocaine and metabolites        300 THC                            50 Performed at [facility], [place] [address] [place], [place] [address]   Ethanol     Status: None   Collection Time: 04/17/18 10:09 PM  Result Value Ref Range   Alcohol, Ethyl (B) <10 <10 mg/dL    Comment: (NOTE) Lowest detectable limit for serum alcohol is  10 mg/dL. For medical purposes only. Performed at [facility], [place] [address], [place], [place] [address]   CBC     Status: None   Collection Time: 04/17/18 10:09 PM  Result Value Ref Range   WBC 6.4 4.0 - 10.5 K/uL   RBC 4.91 4.22 - 5.81 MIL/uL   Hemoglobin 15.1 13.0 - 17.0 g/dL   HCT 43.2 39.0 - 52.0 %   MCV 88.0 80.0 - 100.0 fL   MCH 30.8 26.0 - 34.0 pg   MCHC 35.0 30.0 - 36.0 g/dL   RDW 13.6 11.5 - 15.5 %   Platelets 241 150 - 400 K/uL   nRBC 0.0 0.0 - 0.2 %    Comment: Performed at [facility], [place] [address], [place], [place] [address]  Creatinine, serum     Status: None   Collection Time: 04/17/18 10:09 PM  Result Value Ref Range   Creatinine, Ser 0.94 0.61 - 1.24 mg/dL   GFR calc non Af Amer >60 >60 mL/min   GFR calc Af Amer >60 >60 mL/min    Comment: Performed at [facility] [address], [place], [place] [address]  Basic metabolic panel     Status: Abnormal   Collection Time: 04/18/18  4:00 AM  Result Value Ref Range   Sodium 140 135 - 145 mmol/L   Potassium 4.1 3.5 - 5.1 mmol/L   Chloride 104 98 - 111 mmol/L   CO2 25 22 - 32 mmol/L   Glucose, Bld 172 (H) 70 - 99 mg/dL   BUN 5 (L) 6 - 20 mg/dL   Creatinine, Ser 1.09 0.61 - 1.24 mg/dL   Calcium 8.6 (L) 8.9 - 10.3 mg/dL   GFR calc non Af Amer >60 >60 mL/min   GFR calc Af Amer >60 >60 mL/min   Anion gap 11 5 - 15    Comment: Performed at [facility], [place] [address] [place], [place] [address]  Magnesium     Status: None  Collection Time: 04/18/18  4:00 AM  Result Value Ref Range   Magnesium 2.0 1.7 - 2.4 mg/dL    Comment: Performed at [facility], [place] [address], [place], [place] [address]  CBC WITH DIFFERENTIAL     Status: Abnormal   Collection Time: 04/18/18  4:00 AM  Result Value Ref Range   WBC 10.8 (H) 4.0 - 10.5 K/uL    Comment: REPEATED TO VERIFY   RBC 5.08 4.22 - 5.81 MIL/uL   Hemoglobin 15.0 13.0 - 17.0 g/dL   HCT 45.0 39.0 - 52.0 %   MCV 88.6 80.0 - 100.0 fL    MCH 29.5 26.0 - 34.0 pg   MCHC 33.3 30.0 - 36.0 g/dL   RDW 13.5 11.5 - 15.5 %   Platelets 268 150 - 400 K/uL   nRBC 0.0 0.0 - 0.2 %   Neutrophils Relative % 90 %   Neutro Abs 9.7 (H) 1.7 - 7.7 K/uL   Lymphocytes Relative 9 %   Lymphs Abs 0.9 0.7 - 4.0 K/uL   Monocytes Relative 1 %   Monocytes Absolute 0.1 0.1 - 1.0 K/uL   Eosinophils Relative 0 %   Eosinophils Absolute 0.0 0.0 - 0.5 K/uL   Basophils Relative 0 %   Basophils Absolute 0.0 0.0 - 0.1 K/uL   Immature Granulocytes 0 %   Abs Immature Granulocytes 0.03 0.00 - 0.07 K/uL    Comment: Performed at [facility] [address], [place], [place] [address]  Comprehensive metabolic panel     Status: Abnormal   Collection Time: 04/19/18  3:53 AM  Result Value Ref Range   Sodium 139 135 - 145 mmol/L   Potassium 4.2 3.5 - 5.1 mmol/L   Chloride 101 98 - 111 mmol/L   CO2 27 22 - 32 mmol/L   Glucose, Bld 135 (H) 70 - 99 mg/dL   BUN 7 6 - 20 mg/dL   Creatinine, Ser 1.06 0.61 - 1.24 mg/dL   Calcium 9.5 8.9 - 10.3 mg/dL   Total Protein 7.7 6.5 - 8.1 g/dL   Albumin 4.0 3.5 - 5.0 g/dL   AST 20 15 - 41 U/L   ALT 18 0 - 44 U/L   Alkaline Phosphatase 104 38 - 126 U/L   Total Bilirubin 0.6 0.3 - 1.2 mg/dL   GFR calc non Af Amer >60 >60 mL/min   GFR calc Af Amer >60 >60 mL/min   Anion gap 11 5 - 15    Comment: Performed at [facility] [address], [place], [place] [address]  CBC with Differential/Platelet     Status: Abnormal   Collection Time: 04/19/18  3:53 AM  Result Value Ref Range   WBC 14.0 (H) 4.0 - 10.5 K/uL   RBC 5.20 4.22 - 5.81 MIL/uL   Hemoglobin 15.6 13.0 - 17.0 g/dL   HCT 45.5 39.0 - 52.0 %   MCV 87.5 80.0 - 100.0 fL   MCH 30.0 26.0 - 34.0 pg   MCHC 34.3 30.0 - 36.0 g/dL   RDW 13.2 11.5 - 15.5 %   Platelets 290 150 - 400 K/uL   nRBC 0.0 0.0 - 0.2 %   Neutrophils Relative % 88 %   Neutro Abs 12.4 (H) 1.7 - 7.7 K/uL   Lymphocytes Relative 6 %   Lymphs Abs 0.9 0.7 - 4.0 K/uL   Monocytes Relative  5 %   Monocytes Absolute 0.7 0.1 - 1.0 K/uL   Eosinophils Relative 0 %  Eosinophils Absolute 0.0 0.0 - 0.5 K/uL   Basophils Relative 0 %   Basophils Absolute 0.0 0.0 - 0.1 K/uL   Immature Granulocytes 1 %   Abs Immature Granulocytes 0.08 (H) 0.00 - 0.07 K/uL    Comment: Performed at [facility] [address], [place], [place] [address]    Studies/Results: Ct Diannia Ruder Contast  Result Date: 04/18/2018 CLINICAL DATA:  Diffuse abdominal pain, intermittent diarrhea, history of Crohn's disease EXAM: CT ABDOMEN AND PELVIS WITH CONTRAST (ENTEROGRAPHY) TECHNIQUE: Multidetector CT of the abdomen and pelvis during bolus administration of intravenous contrast. Negative oral contrast was given. CONTRAST:  11m OMNIPAQUE IOHEXOL 300 MG/ML  SOLN COMPARISON:  10/26/2017 FINDINGS: Lower chest:  Lung bases are clear. Hepatobiliary: Liver is within normal limits. Gallbladder is unremarkable. No intrahepatic or extrahepatic ductal dilatation. Pancreas: Within normal limits. Spleen: Within normal limits. Adrenals/Urinary Tract: Adrenal glands are within normal limits. Kidneys are within normal limits. No hydronephrosis. Bladder is within normal limits. Stomach/Bowel: Stomach is within normal limits. Persistent wall thickening involving the terminal ileum (series 3/image 51). Two adjacent loops of distal ileum in the right lower quadrant (series 3/image 53) continue to be adherent with suspected adhesions and patent enteroenteric fistula (series 3/image 50). No evidence of associated abscess. No evidence of bowel obstruction or stricture. No colonic wall thickening or inflammatory changes. Vascular/Lymphatic: No evidence of abdominal aortic aneurysm. No suspicious abdominopelvic lymphadenopathy. Reproductive: Prostate is unremarkable. Other: No abdominopelvic ascites.  No free air. Musculoskeletal: Visualized osseous structures are within normal limits. 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 No evidence of bowel obstruction or stricture. No associated abscess or free air. Electronically Signed   By: SJulian HyM.D.   On: 04/18/2018 09:38   Dg Abdomen Acute W/chest  Result Date: 04/17/2018 CLINICAL DATA:  Upper, central chest pain. Bilateral lower quadrant abdominal pain. Nausea and diarrhea for the past 2 weeks. Vomiting yesterday. History of Crohn's disease and hypertension. Smoker. EXAM: DG ABDOMEN ACUTE W/ 1V CHEST COMPARISON:  01/18/2018. Abdomen and pelvis CT dated 10/26/2017. FINDINGS: Normal sized heart. Clear lungs. Stable mild peribronchial thickening. Minimally dilated bowel loops in the mid abdomen with air-fluid levels. No free peritoneal air. Unremarkable bones. IMPRESSION: 1. Minimal partial small bowel obstruction. 2. Stable mild chronic bronchitic changes. Electronically Signed   By: SClaudie ReveringM.D.   On: 04/17/2018 20:03    Medications: I have reviewed the patient's current medications.  Assessment: Active inflammatory Crohn's disease involving terminal ileum with associated patent enteroenteric fistula with adjacent loop of distal ileum in the right lower quadrant  Recurrence of nausea, vomiting and abdominal pain with sluggish bowel sounds and tender abdomen suspicious for small bowel obstruction  Elevated WBC count, could be related to IV steroids  Plan: Keep patient nothing by mouth, stat abdominal x-ray to rule out small bowel obstruction  Continue normal saline at 50 mL per hour, increase IV fluids if small bowel obstruction noted Continue IV Solu-Medrol 40 mg every 12 hours Patient possibly had coffee-ground emesis, continue monitoring H&H, PPI IV twice a day and sucralfate 1 g 4 times a day.   ARonnette Juniper3/16/2020, 10:33 AM  ADDENDUM: New gaseous distention of small bowel most consistent  with obstruction noted on abdominal x-ray. Gaseous distention of small bowel with loops measuring up to 4 cm. Recommend stat NG tube placement and  keep it to low intermittent suction. Keep patient n.p.o. Surgical evaluation.   Pager [phone] If no answer or after 5 PM call [phone]

## 2018-04-20 ENCOUNTER — Inpatient Hospital Stay (HOSPITAL_COMMUNITY): Payer: Medicare Other

## 2018-04-20 DIAGNOSIS — R1031 Right lower quadrant pain: Secondary | ICD-10-CM

## 2018-04-20 DIAGNOSIS — R140 Abdominal distension (gaseous): Secondary | ICD-10-CM

## 2018-04-20 MED ORDER — KETOROLAC TROMETHAMINE 30 MG/ML IJ SOLN
30.0000 mg | Freq: Four times a day (QID) | INTRAMUSCULAR | Status: DC
  Administered 2018-04-20: 30 mg via INTRAVENOUS
  Filled 2018-04-20: qty 1

## 2018-04-20 MED ORDER — LORAZEPAM 2 MG/ML IJ SOLN
2.0000 mg | Freq: Once | INTRAMUSCULAR | Status: DC
  Filled 2018-04-20: qty 1

## 2018-04-20 MED ORDER — PHENOL 1.4 % MT LIQD
1.0000 | OROMUCOSAL | Status: DC | PRN
  Administered 2018-04-20: 1 via OROMUCOSAL
  Filled 2018-04-20: qty 177

## 2018-04-20 NOTE — Progress Notes (Signed)
Attempted insertion of NG tube into right nare. Patient states he cannot tolerate it. Obtained verbal order from Dr. Verlon Au for 25m Ativan. Will attempt again.

## 2018-04-20 NOTE — Progress Notes (Signed)
[facility] Progress Note     Subjective: CC: sbo Per patient NGT came out this AM when he went to blow his nose. Feeling nauseated and more bloated again. Passing some gas and had a BM but clinically feels worse. Abdominal pain generalized and medication not really helping.   Objective: Vital signs in last 24 hours: Temp:  [98.1 F (36.7 C)-98.6 F (37 C)] 98.6 F (37 C) (03/17 0625) Pulse Rate:  [58-74] 60 (03/17 0625) Resp:  [18-19] 18 (03/17 0625) BP: (131-161)/(77-96) 131/77 (03/17 0625) SpO2:  [97 %-100 %] 98 % (03/17 0625) Last BM Date: 04/19/18  Intake/Output from previous day: 03/16 0701 - 03/17 0700 In: 1889.7 [P.O.:240; I.V.:1549.7; NG/GT:100] Out: 2150 [Urine:650; Emesis/NG output:1500] Intake/Output this shift: No intake/output data recorded.  PE: Gen:  Alert, NAD, pleasant Card:  Regular rate and rhythm Pulm:  Normal effort, clear to auscultation bilaterally Abd: Soft, generalized mild TTP, distended, +BS Skin: warm and dry, no rashes  Psych: A&Ox3   Lab Results:  Recent Labs    04/18/18 0400 04/19/18 0353  WBC 10.8* 14.0*  HGB 15.0 15.6  HCT 45.0 45.5  PLT 268 290   BMET Recent Labs    04/18/18 0400 04/19/18 0353  NA 140 139  K 4.1 4.2  CL 104 101  CO2 25 27  GLUCOSE 172* 135*  BUN 5* 7  CREATININE 1.09 1.06  CALCIUM 8.6* 9.5   PT/INR No results for input(s): LABPROT, INR in the last 72 hours. CMP     Component Value Date/Time   NA 139 04/19/2018 0353   K 4.2 04/19/2018 0353   CL 101 04/19/2018 0353   CO2 27 04/19/2018 0353   GLUCOSE 135 (H) 04/19/2018 0353   BUN 7 04/19/2018 0353   CREATININE 1.06 04/19/2018 0353   CALCIUM 9.5 04/19/2018 0353   PROT 7.7 04/19/2018 0353   ALBUMIN 4.0 04/19/2018 0353   AST 20 04/19/2018 0353   ALT 18 04/19/2018 0353   ALKPHOS 104 04/19/2018 0353   BILITOT 0.6 04/19/2018 0353   GFRNONAA >60 04/19/2018 0353   GFRAA >60 04/19/2018 0353   Lipase     Component Value Date/Time    LIPASE 32 04/17/2018 1749       Studies/Results: Dg Abd 1 View  Result Date: 04/19/2018 CLINICAL DATA:  Abdominal distension for 2 weeks in a patient with a history of Crohn's disease. EXAM: ABDOMEN - 1 VIEW COMPARISON:  CT abdomen and pelvis 04/18/2018 and chest and two views abdomen 04/17/2018. FINDINGS: There is gaseous distention of small bowel with loops measuring up to approximately 4 cm. There is some stool in the ascending colon. No abnormal abdominal calcification. No focal bony abnormality. IMPRESSION: New gaseous distention of small bowel most consistent with obstruction. Electronically Signed   By: Inge Rise M.D.   On: 04/19/2018 11:01   Ct Entero Abd/pelvis W Contast  Result Date: 04/18/2018 CLINICAL DATA:  Diffuse abdominal pain, intermittent diarrhea, history of Crohn's disease EXAM: CT ABDOMEN AND PELVIS WITH CONTRAST (ENTEROGRAPHY) TECHNIQUE: Multidetector CT of the abdomen and pelvis during bolus administration of intravenous contrast. Negative oral contrast was given. CONTRAST:  173m OMNIPAQUE IOHEXOL 300 MG/ML  SOLN COMPARISON:  10/26/2017 FINDINGS: Lower chest:  Lung bases are clear. Hepatobiliary: Liver is within normal limits. Gallbladder is unremarkable. No intrahepatic or extrahepatic ductal dilatation. Pancreas: Within normal limits. Spleen: Within normal limits. Adrenals/Urinary Tract: Adrenal glands are within normal limits. Kidneys are within normal limits. No hydronephrosis. Bladder is within normal  limits. Stomach/Bowel: Stomach is within normal limits. Persistent wall thickening involving the terminal ileum (series 3/image 51). Two adjacent loops of distal ileum in the right lower quadrant (series 3/image 53) continue to be adherent with suspected adhesions and patent enteroenteric fistula (series 3/image 50). No evidence of associated abscess. No evidence of bowel obstruction or stricture. No colonic wall thickening or inflammatory changes. Vascular/Lymphatic: No  evidence of abdominal aortic aneurysm. No suspicious abdominopelvic lymphadenopathy. Reproductive: Prostate is unremarkable. Other: No abdominopelvic ascites.  No free air. Musculoskeletal: Visualized osseous structures are within normal limits. IMPRESSION: Active inflammatory Crohn's disease involving the terminal ileum with associated patent enteroenteric fistula with adjacent loop of distal ileum in the right lower quadrant, as described above. No evidence of bowel obstruction or stricture. No associated abscess or free air. Electronically Signed   By: Julian Hy M.D.   On: 04/18/2018 09:38   Dg Abd Portable 1v  Result Date: 04/20/2018 CLINICAL DATA:  Abdominal pain and distension. History of Crohn's disease EXAM: PORTABLE ABDOMEN - 1 VIEW COMPARISON:  April 19, 2018 and CT abdomen and pelvis April 18, 2018 FINDINGS: There remain loops of dilated small bowel. No air-fluid levels. Contrast is seen in the colon. No free air. No abnormal calcifications. IMPRESSION: There remain loops of dilated small bowel, similar to 1 day prior. A degree of bowel obstruction cannot be excluded. Contrast is seen in the colon. No free air. Electronically Signed   By: Lowella Grip III M.D.   On: 04/20/2018 07:40   Dg Abd Portable 1v  Result Date: 04/19/2018 CLINICAL DATA:  NG tube placement EXAM: PORTABLE ABDOMEN - 1 VIEW COMPARISON:  04/19/2010 FINDINGS: There is a nasogastric tube with the tip projecting over the stomach. There is small bowel dilatation measuring up to 4.2 cm. There is no evidence of pneumoperitoneum, portal venous gas or pneumatosis. There are no pathologic calcifications along the expected course of the ureters. The osseous structures are unremarkable. IMPRESSION: 1. Small bowel dilatation measuring up to 4.2 cm concerning for small bowel obstruction. Nasogastric tube with the tip projecting over the stomach. Electronically Signed   By: Kathreen Devoid   On: 04/19/2018 16:46     Anti-infectives: Anti-infectives (From admission, onward)   None       Assessment/Plan Bipolar, PTSD GERD Tobacco abuse  Active inflammatory Crohn's disease, known enteroenteric fistula SBO - Patient with Crohn's disease since age 38, recently followed by Dr. Michail Sermon and was receiving Cimzia injections. Admitted 3/14 with an acute Crohn's flare up, GI following.  - NGT placed yesterday and got 1L out, removed this AM - replace NGT - film this AM with persistent small bowel dilatation  - no indications for acute surgical intervention, hopefully this will resolve with bowel rest and decompression but we will continue to monitor   ID - none VTE - SCDs, sq heparin  FEN - IVF, NPO Foley - none Follow up - TBD  LOS: 3 days    Brigid Re , [facility] 04/20/2018, 7:56 AM Pager: [place]: [phone]

## 2018-04-20 NOTE — Progress Notes (Signed)
Went in room for bedside report with Mendel Ryder, RN and Lenna Sciara, RN and patient was coming back from bathroom and NG tube was removed.  Patient denies pulling NG tube out stating he blew his nose and it came out.  Mendel Ryder, RN to page oncoming MD to make them aware that NG tube is out.

## 2018-04-20 NOTE — Progress Notes (Signed)
Subjective: The patient was seen and examined at bedside. He reports that NG tube inadvertently came out when he sneezed today morning. Continues to have abdominal distention, although he had loose liquid bowel movements. He continues to feel nauseous but denies vomiting.  Objective: Vital signs in last 24 hours: Temp:  [98.1 F (36.7 C)-98.6 F (37 C)] 98.6 F (37 C) (03/17 0625) Pulse Rate:  [58-74] 60 (03/17 0625) Resp:  [18-19] 18 (03/17 0625) BP: (131-161)/(77-96) 131/77 (03/17 0625) SpO2:  [97 %-100 %] 98 % (03/17 0625) Weight change:  Last BM Date: 04/19/18  PE:not in distress GENERAL:pallor, no icterus ABDOMEN:remains distended, less than yesterday, intermittent high pitched and hyperactive bowel sounds noted EXTREMITIES:no deformity  Lab Results: Results for orders placed or performed during the hospital encounter of 04/17/18 (from the past 48 hour(s))  Comprehensive metabolic panel     Status: Abnormal   Collection Time: 04/19/18  3:53 AM  Result Value Ref Range   Sodium 139 135 - 145 mmol/L   Potassium 4.2 3.5 - 5.1 mmol/L   Chloride 101 98 - 111 mmol/L   CO2 27 22 - 32 mmol/L   Glucose, Bld 135 (H) 70 - 99 mg/dL   BUN 7 6 - 20 mg/dL   Creatinine, Ser 1.06 0.61 - 1.24 mg/dL   Calcium 9.5 8.9 - 10.3 mg/dL   Total Protein 7.7 6.5 - 8.1 g/dL   Albumin 4.0 3.5 - 5.0 g/dL   AST 20 15 - 41 U/L   ALT 18 0 - 44 U/L   Alkaline Phosphatase 104 38 - 126 U/L   Total Bilirubin 0.6 0.3 - 1.2 mg/dL   GFR calc non Af Amer >60 >60 mL/min   GFR calc Af Amer >60 >60 mL/min   Anion gap 11 5 - 15    Comment: Performed at [facility], [address], [place], [place] [address]  CBC with Differential/Platelet     Status: Abnormal   Collection Time: 04/19/18  3:53 AM  Result Value Ref Range   WBC 14.0 (H) 4.0 - 10.5 K/uL   RBC 5.20 4.22 - 5.81 MIL/uL   Hemoglobin 15.6 13.0 - 17.0 g/dL   HCT 45.5 39.0 - 52.0 %   MCV 87.5 80.0 - 100.0 fL   MCH 30.0 26.0 - 34.0 pg    MCHC 34.3 30.0 - 36.0 g/dL   RDW 13.2 11.5 - 15.5 %   Platelets 290 150 - 400 K/uL   nRBC 0.0 0.0 - 0.2 %   Neutrophils Relative % 88 %   Neutro Abs 12.4 (H) 1.7 - 7.7 K/uL   Lymphocytes Relative 6 %   Lymphs Abs 0.9 0.7 - 4.0 K/uL   Monocytes Relative 5 %   Monocytes Absolute 0.7 0.1 - 1.0 K/uL   Eosinophils Relative 0 %   Eosinophils Absolute 0.0 0.0 - 0.5 K/uL   Basophils Relative 0 %   Basophils Absolute 0.0 0.0 - 0.1 K/uL   Immature Granulocytes 1 %   Abs Immature Granulocytes 0.08 (H) 0.00 - 0.07 K/uL    Comment: Performed at [facility] [address], [place],  [address]    Studies/Results: Dg Abd 1 View  Result Date: 04/19/2018 CLINICAL DATA:  Abdominal distension for 2 weeks in a patient with a history of Crohn's disease. EXAM: ABDOMEN - 1 VIEW COMPARISON:  CT abdomen and pelvis 04/18/2018 and chest and two views abdomen 04/17/2018. FINDINGS: There is gaseous distention of small bowel with loops  measuring up to approximately 4 cm. There is some stool in the ascending colon. No abnormal abdominal calcification. No focal bony abnormality. IMPRESSION: New gaseous distention of small bowel most consistent with obstruction. Electronically Signed   By: Inge Rise M.D.   On: 04/19/2018 11:01   Ct Entero Abd/pelvis W Contast  Result Date: 04/18/2018 CLINICAL DATA:  Diffuse abdominal pain, intermittent diarrhea, history of Crohn's disease EXAM: CT ABDOMEN AND PELVIS WITH CONTRAST (ENTEROGRAPHY) TECHNIQUE: Multidetector CT of the abdomen and pelvis during bolus administration of intravenous contrast. Negative oral contrast was given. CONTRAST:  180m OMNIPAQUE IOHEXOL 300 MG/ML  SOLN COMPARISON:  10/26/2017 FINDINGS: Lower chest:  Lung bases are clear. Hepatobiliary: Liver is within normal limits. Gallbladder is unremarkable. No intrahepatic or extrahepatic ductal dilatation. Pancreas: Within normal limits. Spleen: Within normal limits. Adrenals/Urinary Tract: Adrenal  glands are within normal limits. Kidneys are within normal limits. No hydronephrosis. Bladder is within normal limits. Stomach/Bowel: Stomach is within normal limits. Persistent wall thickening involving the terminal ileum (series 3/image 51). Two adjacent loops of distal ileum in the right lower quadrant (series 3/image 53) continue to be adherent with suspected adhesions and patent enteroenteric fistula (series 3/image 50). No evidence of associated abscess. No evidence of bowel obstruction or stricture. No colonic wall thickening or inflammatory changes. Vascular/Lymphatic: No evidence of abdominal aortic aneurysm. No suspicious abdominopelvic lymphadenopathy. Reproductive: Prostate is unremarkable. Other: No abdominopelvic ascites.  No free air. Musculoskeletal: Visualized osseous structures are within normal limits. IMPRESSION: Active inflammatory Crohn's disease involving the terminal ileum with associated patent enteroenteric fistula with adjacent loop of distal ileum in the right lower quadrant, as described above. No evidence of bowel obstruction or stricture. No associated abscess or free air. Electronically Signed   By: SJulian HyM.D.   On: 04/18/2018 09:38   Dg Abd Portable 1v  Result Date: 04/20/2018 CLINICAL DATA:  Abdominal pain and distension. History of Crohn's disease EXAM: PORTABLE ABDOMEN - 1 VIEW COMPARISON:  April 19, 2018 and CT abdomen and pelvis April 18, 2018 FINDINGS: There remain loops of dilated small bowel. No air-fluid levels. Contrast is seen in the colon. No free air. No abnormal calcifications. IMPRESSION: There remain loops of dilated small bowel, similar to 1 day prior. A degree of bowel obstruction cannot be excluded. Contrast is seen in the colon. No free air. Electronically Signed   By: WLowella GripIII M.D.   On: 04/20/2018 07:40   Dg Abd Portable 1v  Result Date: 04/19/2018 CLINICAL DATA:  NG tube placement EXAM: PORTABLE ABDOMEN - 1 VIEW COMPARISON:   04/19/2010 FINDINGS: There is a nasogastric tube with the tip projecting over the stomach. There is small bowel dilatation measuring up to 4.2 cm. There is no evidence of pneumoperitoneum, portal venous gas or pneumatosis. There are no pathologic calcifications along the expected course of the ureters. The osseous structures are unremarkable. IMPRESSION: 1. Small bowel dilatation measuring up to 4.2 cm concerning for small bowel obstruction. Nasogastric tube with the tip projecting over the stomach. Electronically Signed   By: HKathreen Devoid  On: 04/19/2018 16:46    Medications: I have reviewed the patient's current medications.  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 Small bowel obstruction with dilated loops of small bowel, contrast seen in colon with no free air and no air fluid levels noted on x-ray from today 1500 mL of NG output in the last 24 hours   Plan: Continue to keep nothing by mouth for  today Follow up with abdominal x-ray tomorrow morning Patient had difficulty with NG tube placement yesterday, unless he has worsening abdominal distention or recurrence of nausea/ vomiting, okay not to reinsert NG tube Continue IV Solu-Medrol 40 mg every 12 hours along with Protonix 40 mg every 12 hours and Zofran as needed   Ronnette Juniper 04/20/2018, 8:57 AM   Pager [phone] If no answer or after 5 PM call [phone]

## 2018-04-20 NOTE — Progress Notes (Signed)
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 Bipolar Reflux Multiple admissions in the past most recently 9/23-9/24 2019 with exacerbation  Returns to emergency room with severe abdominal pain, nausea vomiting abdominal cramping and multiple episodes of watery diarrhea daily also reported emesis  Note has been fired from multiple GI practices in the past because of noncompliance and poor follow-up  Imaging confirms small bowel obstruction awaiting CT scan, further labs were relatively normal-note 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 Holding Carafate but continue Protonix Leukocytosis Increase in WBC corresponds with start of steroids--no fever--only monitor at this stage Repeat CBC a.m. Bipolar Not on meds Noncompliance Tells me hasn't followed up with GI because he still has pain and the steroids do no really work to resolve this Advised continued compliance Tobacco abuse Advised not to smoke in hopsital Drug abuse   DVT loveneox  Code Status: full  Communication: patient alone  Disposition Plan: inpatient pedning GI input    Marliyah Reid, MD  [facility] Via [employer] app OR [email or web address] 7PM-7AM contact night coverage as above 04/20/2018, 8:05 AM  LOS: 3  days   Consultants:  GI    Interval history/Subjective:  Pain in abdomen still present despite opiates Tells me he passed some gas and maybe some stool earlier today No nausea vomiting Events noted NG tube pulled out  Objective:  Vitals:  Vitals:   04/19/18 2124 04/20/18 0625  BP: (!) 161/96 131/77  Pulse: 74 60  Resp: 19 18  Temp: 98.6 F (37 C) 98.6 F (37 C)  SpO2: 97% 98%    Exam:  EOMI NCAT no distress Some mild discomfort No icterus no pallor Chest clear no added sound Abdomen distended bowel sounds minimal   I have personally reviewed the following:  DATA   Labs:  Wbc up from 6.4-->10-->14 Glucose 176  Imaging studies: IMPRESSION: Active inflammatory Crohn's disease involving the terminal ileum with associated patent enteroenteric fistula with adjacent loop of distal ileum in the right lower quadrant, as described above.  No evidence of bowel obstruction or stricture. No associated abscess or free air.   Scheduled Meds: . gabapentin  300 mg Oral TID  . heparin  5,000 Units Subcutaneous Q8H  . ketorolac  30 mg Intravenous Q6H  . methylPREDNISolone (SOLU-MEDROL) injection  40 mg Intravenous Q12H  . nicotine  21 mg Transdermal Daily  . pantoprazole (PROTONIX) IV  40 mg Intravenous Q12H  . sodium chloride flush  3 mL Intravenous Once  . sucralfate  1 g Oral TID WC & HS   Continuous Infusions: . sodium chloride 50 mL/hr at 04/20/18 0600    Active Problems:   Crohn's disease ([place])   Diarrhea   Crohn's colitis, unspecified complication (HCC)   SBO (small bowel obstruction) ([place])   Bipolar 1 disorder ([place])  Noncompliance   PTSD (post-traumatic stress disorder)   Crohn's disease of colon with intestinal obstruction ([place])   LOS: 3 days

## 2018-04-20 NOTE — Progress Notes (Signed)
Entered patient's room to inform him the NGT can remain out unless he becomes nauseous and is vomiting. Patient was leaned over the couch with IV unhooked, running on the floor. Explained to patient we need to hook him back up and he cannot unhook his own IV. Patient also removed his IV. Explained to patient the importance of staying at the hospital as if he returns to the ED with n/v he will have to likely have an NGT placed. Patient refused to stay.   Paged Dr. Verlon Au to let him know patient wishes to leave AMA. Went to obtain the [facility] paperwork and patient walked out of unit. Davina Poke, RN and myself asked the patient to sign the paperwork and he refused. He stated, "We are covered and if we need anything from him, we can call him."  Patient left AMA.

## 2018-05-04 NOTE — Discharge Summary (Addendum)
Physician Discharge Summary  Cleatis Fandrich UYQ:[id number] DOB: 1980/11/01 DOA: 04/17/2018  PCP: Patient, No Pcp Per  Admit date: 04/17/2018 Discharge date: 05/04/2018  Time spent: 15 minutes  Recommendations for Outpatient Follow-up:  1. 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 Bipolar Reflux Multiple admissions in the past most recently 9/23-9/24 2019 with exacerbation  Returns to emergency room with severe abdominal pain, nausea vomiting abdominal cramping and multiple episodes of watery diarrhea daily also reported emesis  Note has been fired from multiple GI practices in the past because of noncompliance and poor follow-up  Had SBO, seen by GI  LEFT [place] Hospital Course:  Crohn's disease with acute flare Enteroenteric fistula Started on Solu-Medrol 40 every 12-->Left AMA, no fruthe rRX SBO Episodic nausea vomiting and significant pain prompted x-ray 3/16 showing SBO Repeat x-ray 3/17 shows contrast in bowel He has pulled out his NG tube but he still having pain He was insubordinate and uncooperative with nurses, and ultimately decided to leave vs medical advice Bipolar Not on meds Noncompliance LEFT AMA Tobacco abuse Advised not to smoke in hopsital Drug abuse    Consultations:  GI  Discharge Exam: Vitals:   04/19/18 2124 04/20/18 0625  BP: (!) 161/96 131/77  Pulse: 74 60   Resp: 19 18  Temp: 98.6 F (37 C) 98.6 F (37 C)  SpO2: 97% 98%    General: eomi ncat Cardiovascular: s1 s 2no m/r/g Respiratory: clear no adde dsound abd soft nt nd no rebond no gaurd  Discharge Instructions    Allergies as of 04/20/2018   No Known Allergies     Medication List    ASK your doctor about these medications   acetaminophen 325 MG tablet Commonly known as:  Tylenol Take 2 tablets (650 mg total) by mouth every 6 (six) hours as needed.   gabapentin 300 MG capsule Commonly known as:  NEURONTIN Take 300 mg by mouth 3 (three) times daily.   omeprazole 40 MG capsule Commonly known as:  PRILOSEC Take 1 capsule (40 mg total) by mouth daily.   sucralfate 1 g tablet Commonly known as:  Carafate Take 1 tablet (1 g total) by mouth 4 (four) times daily -  with meals and at bedtime.      No Known Allergies    The results of significant diagnostics from this hospitalization (including imaging, microbiology, ancillary and laboratory) are listed below for reference.    Significant Diagnostic Studies: Dg Abd 1 View  Result Date: 04/19/2018 CLINICAL DATA:  Abdominal distension for 2 weeks in a patient with a history of Crohn's disease. EXAM: ABDOMEN - 1 VIEW COMPARISON:  CT abdomen and pelvis 04/18/2018 and chest and two views abdomen 04/17/2018. FINDINGS: There is gaseous distention of small bowel with loops measuring up to approximately 4 cm. There is some stool in the ascending colon. No abnormal abdominal calcification. No focal bony abnormality. IMPRESSION: New gaseous distention of small bowel most  consistent with obstruction. Electronically Signed   By: Inge Rise M.D.   On: 04/19/2018 11:01   Ct Entero Abd/pelvis W Contast  Result Date: 04/18/2018 CLINICAL DATA:  Diffuse abdominal pain, intermittent diarrhea, history of Crohn's disease EXAM: CT ABDOMEN AND PELVIS WITH CONTRAST (ENTEROGRAPHY) TECHNIQUE: Multidetector CT of the abdomen and pelvis during  bolus administration of intravenous contrast. Negative oral contrast was given. CONTRAST:  137m OMNIPAQUE IOHEXOL 300 MG/ML  SOLN COMPARISON:  10/26/2017 FINDINGS: Lower chest:  Lung bases are clear. Hepatobiliary: Liver is within normal limits. Gallbladder is unremarkable. No intrahepatic or extrahepatic ductal dilatation. Pancreas: Within normal limits. Spleen: Within normal limits. Adrenals/Urinary Tract: Adrenal glands are within normal limits. Kidneys are within normal limits. No hydronephrosis. Bladder is within normal limits. Stomach/Bowel: Stomach is within normal limits. Persistent wall thickening involving the terminal ileum (series 3/image 51). Two adjacent loops of distal ileum in the right lower quadrant (series 3/image 53) continue to be adherent with suspected adhesions and patent enteroenteric fistula (series 3/image 50). No evidence of associated abscess. No evidence of bowel obstruction or stricture. No colonic wall thickening or inflammatory changes. Vascular/Lymphatic: No evidence of abdominal aortic aneurysm. No suspicious abdominopelvic lymphadenopathy. Reproductive: Prostate is unremarkable. Other: No abdominopelvic ascites.  No free air. Musculoskeletal: Visualized osseous structures are within normal limits. IMPRESSION: Active inflammatory Crohn's disease involving the terminal ileum with associated patent enteroenteric fistula with adjacent loop of distal ileum in the right lower quadrant, as described above. No evidence of bowel obstruction or stricture. No associated abscess or free air. Electronically Signed   By: SJulian HyM.D.   On: 04/18/2018 09:38   Dg Abdomen Acute W/chest  Result Date: 04/17/2018 CLINICAL DATA:  Upper, central chest pain. Bilateral lower quadrant abdominal pain. Nausea and diarrhea for the past 2 weeks. Vomiting yesterday. History of Crohn's disease and hypertension. Smoker. EXAM: DG ABDOMEN ACUTE W/ 1V CHEST COMPARISON:  01/18/2018. Abdomen and pelvis  CT dated 10/26/2017. FINDINGS: Normal sized heart. Clear lungs. Stable mild peribronchial thickening. Minimally dilated bowel loops in the mid abdomen with air-fluid levels. No free peritoneal air. Unremarkable bones. IMPRESSION: 1. Minimal partial small bowel obstruction. 2. Stable mild chronic bronchitic changes. Electronically Signed   By: SClaudie ReveringM.D.   On: 04/17/2018 20:03   Dg Abd Portable 1v  Result Date: 04/20/2018 CLINICAL DATA:  Abdominal pain and distension. History of Crohn's disease EXAM: PORTABLE ABDOMEN - 1 VIEW COMPARISON:  April 19, 2018 and CT abdomen and pelvis April 18, 2018 FINDINGS: There remain loops of dilated small bowel. No air-fluid levels. Contrast is seen in the colon. No free air. No abnormal calcifications. IMPRESSION: There remain loops of dilated small bowel, similar to 1 day prior. A degree of bowel obstruction cannot be excluded. Contrast is seen in the colon. No free air. Electronically Signed   By: WLowella GripIII M.D.   On: 04/20/2018 07:40   Dg Abd Portable 1v  Result Date: 04/19/2018 CLINICAL DATA:  NG tube placement EXAM: PORTABLE ABDOMEN - 1 VIEW COMPARISON:  04/19/2010 FINDINGS: There is a nasogastric tube with the tip projecting over the stomach. There is small bowel dilatation measuring up to 4.2 cm. There is no evidence of pneumoperitoneum, portal venous gas or pneumatosis. There are no pathologic calcifications along the expected course of the ureters. The osseous structures are unremarkable. IMPRESSION: 1. Small bowel dilatation measuring up to 4.2 cm concerning for small bowel obstruction. Nasogastric tube with the tip projecting over the stomach. Electronically Signed  By: Kathreen Devoid   On: 04/19/2018 16:46    Microbiology: No results found for this or any previous visit (from the past 240 hour(s)).   Labs: Basic Metabolic Panel: No results for input(s): NA, K, CL, CO2, GLUCOSE, BUN, CREATININE, CALCIUM, MG, PHOS in the last 168  hours. Liver Function Tests: No results for input(s): AST, ALT, ALKPHOS, BILITOT, PROT, ALBUMIN in the last 168 hours. No results for input(s): LIPASE, AMYLASE in the last 168 hours. No results for input(s): AMMONIA in the last 168 hours. CBC: No results for input(s): WBC, NEUTROABS, HGB, HCT, MCV, PLT in the last 168 hours. Cardiac Enzymes: No results for input(s): CKTOTAL, CKMB, CKMBINDEX, TROPONINI in the last 168 hours. BNP: BNP (last 3 results) No results for input(s): BNP in the last 8760 hours.  ProBNP (last 3 results) No results for input(s): PROBNP in the last 8760 hours.  CBG: No results for input(s): GLUCAP in the last 168 hours.     Signed:  Nita Sells MD   [facility] 05/04/2018, 3:19 PM

## 2018-07-23 ENCOUNTER — Emergency Department (HOSPITAL_COMMUNITY): Payer: Medicare Other

## 2018-07-23 ENCOUNTER — Other Ambulatory Visit: Payer: Self-pay

## 2018-07-23 ENCOUNTER — Inpatient Hospital Stay (HOSPITAL_COMMUNITY)
Admission: EM | Admit: 2018-07-23 | Discharge: 2018-07-23 | DRG: 389 | Discharge status: Against Medical Advice | Payer: Medicare Other | Attending: Internal Medicine | Admitting: Internal Medicine

## 2018-07-23 ENCOUNTER — Encounter (HOSPITAL_COMMUNITY): Payer: Self-pay

## 2018-07-23 DIAGNOSIS — K5090 Crohn's disease, unspecified, without complications: Secondary | ICD-10-CM | POA: Diagnosis present

## 2018-07-23 DIAGNOSIS — Z5320 Procedure and treatment not carried out because of patient's decision for unspecified reasons: Secondary | ICD-10-CM | POA: Diagnosis present

## 2018-07-23 DIAGNOSIS — K56609 Unspecified intestinal obstruction, unspecified as to partial versus complete obstruction: Secondary | ICD-10-CM

## 2018-07-23 DIAGNOSIS — R748 Abnormal levels of other serum enzymes: Secondary | ICD-10-CM | POA: Diagnosis present

## 2018-07-23 DIAGNOSIS — K56600 Partial intestinal obstruction, unspecified as to cause: Secondary | ICD-10-CM | POA: Diagnosis not present

## 2018-07-23 LAB — COMPREHENSIVE METABOLIC PANEL
ALT: 14 U/L (ref 0–44)
AST: 17 U/L (ref 15–41)
Albumin: 3.8 g/dL (ref 3.5–5.0)
Alkaline Phosphatase: 103 U/L (ref 38–126)
Anion gap: 11 (ref 5–15)
BUN: 5 mg/dL — ABNORMAL LOW (ref 6–20)
CO2: 26 mmol/L (ref 22–32)
Calcium: 8.9 mg/dL (ref 8.9–10.3)
Chloride: 103 mmol/L (ref 98–111)
Creatinine, Ser: 0.69 mg/dL (ref 0.61–1.24)
GFR calc Af Amer: >60 mL/min (ref >60)
GFR calc non Af Amer: >60 mL/min (ref >60)
Glucose, Bld: 87 mg/dL (ref 70–99)
Potassium: 3.4 mmol/L — ABNORMAL LOW (ref 3.5–5.1)
Sodium: 140 mmol/L (ref 135–145)
Total Bilirubin: 0.3 mg/dL (ref 0.3–1.2)
Total Protein: 7.3 g/dL (ref 6.5–8.1)

## 2018-07-23 LAB — LIPASE, BLOOD: Lipase: 85 U/L — ABNORMAL HIGH (ref 11–51)

## 2018-07-23 LAB — URINALYSIS, ROUTINE W REFLEX MICROSCOPIC
Bilirubin Urine: NEGATIVE
Glucose, UA: NEGATIVE mg/dL
Hgb urine dipstick: NEGATIVE
Ketones, ur: NEGATIVE mg/dL
Leukocytes,Ua: NEGATIVE
Nitrite: NEGATIVE
Protein, ur: NEGATIVE mg/dL
Specific Gravity, Urine: 1.001 — ABNORMAL LOW (ref 1.005–1.030)
pH: 6 (ref 5.0–8.0)

## 2018-07-23 LAB — CBC WITH DIFFERENTIAL/PLATELET
Abs Immature Granulocytes: 0.01 10*3/uL (ref 0.00–0.07)
Basophils Absolute: 0 10*3/uL (ref 0.0–0.1)
Basophils Relative: 1 %
Eosinophils Absolute: 0.2 10*3/uL (ref 0.0–0.5)
Eosinophils Relative: 4 %
HCT: 46.2 % (ref 39.0–52.0)
Hemoglobin: 15.7 g/dL (ref 13.0–17.0)
Immature Granulocytes: 0 %
Lymphocytes Relative: 24 %
Lymphs Abs: 1.5 10*3/uL (ref 0.7–4.0)
MCH: 29.8 pg (ref 26.0–34.0)
MCHC: 34 g/dL (ref 30.0–36.0)
MCV: 87.7 fL (ref 80.0–100.0)
Monocytes Absolute: 0.8 10*3/uL (ref 0.1–1.0)
Monocytes Relative: 13 %
Neutro Abs: 3.7 10*3/uL (ref 1.7–7.7)
Neutrophils Relative %: 58 %
Platelets: 278 10*3/uL (ref 150–400)
RBC: 5.27 MIL/uL (ref 4.22–5.81)
RDW: 14.2 % (ref 11.5–15.5)
WBC: 6.3 10*3/uL (ref 4.0–10.5)
nRBC: 0 % (ref 0.0–0.2)

## 2018-07-23 MED ORDER — MORPHINE SULFATE (PF) 4 MG/ML IV SOLN
4.0000 mg | Freq: Once | INTRAVENOUS | Status: AC
  Administered 2018-07-23: 4 mg via INTRAVENOUS
  Filled 2018-07-23: qty 1

## 2018-07-23 MED ORDER — IOHEXOL 300 MG/ML  SOLN
30.0000 mL | Freq: Once | INTRAMUSCULAR | Status: AC | PRN
  Administered 2018-07-23: 30 mL via ORAL

## 2018-07-23 MED ORDER — HYDROMORPHONE HCL 1 MG/ML IJ SOLN
1.0000 mg | Freq: Once | INTRAMUSCULAR | Status: AC
  Administered 2018-07-23: 09:00:00 1 mg via INTRAVENOUS
  Filled 2018-07-23: qty 1

## 2018-07-23 MED ORDER — ONDANSETRON HCL 4 MG/2ML IJ SOLN
4.0000 mg | Freq: Once | INTRAMUSCULAR | Status: AC
  Administered 2018-07-23: 4 mg via INTRAVENOUS
  Filled 2018-07-23: qty 2

## 2018-07-23 MED ORDER — IOHEXOL 300 MG/ML  SOLN
100.0000 mL | Freq: Once | INTRAMUSCULAR | Status: AC | PRN
  Administered 2018-07-23: 100 mL via INTRAVENOUS

## 2018-07-23 MED ORDER — SODIUM CHLORIDE 0.9 % IV BOLUS
1000.0000 mL | Freq: Once | INTRAVENOUS | Status: AC
  Administered 2018-07-23: 1000 mL via INTRAVENOUS

## 2018-07-23 MED ORDER — SODIUM CHLORIDE (PF) 0.9 % IJ SOLN
INTRAMUSCULAR | Status: AC
  Filled 2018-07-23: qty 50

## 2018-07-23 NOTE — ED Triage Notes (Signed)
Pt hx of Chrohn's disease.  Pt c/o of what feels like, "tight and twisting in my intestines."  Pt states he does not think this is a flare up because the pain is different.  Pt states this pain has been going on for three days.

## 2018-07-23 NOTE — Discharge Instructions (Addendum)
You are leaving against medical advice.  Your condition may get worse.  Please return if you have any concerns.

## 2018-07-23 NOTE — Evaluation (Signed)
I came to see the patient for evaluation and consultation of his PSBO seen on CT. Patient's room empty and belonging are gone. I spoke with his nurse who reports the patient left AMA. I was unable to see or examine the patient before he left AMA. If he returns, our services are available.

## 2018-07-23 NOTE — ED Provider Notes (Addendum)
[facility] DEPT Provider Note   CSN: [id number] Arrival date & time: 07/23/18  0705     History   Chief Complaint Chief Complaint  Patient presents with   Abdominal Pain    HPI Jeffery Mcintosh is a 38 y.o. male.     The history is provided by the patient and medical records. No language interpreter was used.  Abdominal Pain    38 year old male with history of Crohn's disease, SBO, abdominal presenting for evaluation of abdominal pain.  Patient report for the past 3 days he has had persistent abdominal pain.  Pain is across his lower abdomen near his belt line as well as the epigastric region.  He described as a twisting sensation, 9 out of 10, with associated nausea and mild constipation.  Pain felt different from his prior Crohn's flare.  Certain movement seems to alleviate his pain.  No report of fever chills no chest pain shortness of breath productive cough dysuria hematuria penis pain testicular pain.  No significant treatment tried.  Past Medical History:  Diagnosis Date   Anxiety    Arthritis    Bipolar affective ([place])    Chronic headaches    Crohn's disease ([place]) history of   Depression    GERD (gastroesophageal reflux disease)    Hypertension    Noncompliance 04/17/2018   PTSD (post-traumatic stress disorder) 04/17/2018   Schizophrenic disorder [facility])     Patient Active Problem List   Diagnosis Date Noted   Crohn's disease of colon with intestinal obstruction ([place])    Noncompliance 04/17/2018   PTSD (post-traumatic stress disorder) 04/17/2018   Crohn's disease of jejunum with intestinal obstruction ([place]) 10/26/2017   SBO (small bowel obstruction) ([place]) 10/26/2017   Bipolar 1 disorder ([place]) 10/26/2017   Crohn's colitis, unspecified complication ([place]) 76/28/3151   Epigastric burning sensation 01/03/2015   Numbness and tingling in left arm 11/15/2014   Exacerbation of Crohn's disease ([place]) 10/21/2014    Crohn's disease ([place]) 10/21/2014   Diarrhea 10/21/2014   Abdominal pain    Gout 04/18/2013   Cubital tunnel syndrome 04/18/2013   Abnormal urinalysis 04/18/2013    Past Surgical History:  Procedure Laterality Date   NO PAST SURGERIES          Home Medications    Prior to Admission medications   Medication Sig Start Date End Date Taking? Authorizing Provider  acetaminophen (TYLENOL) 325 MG tablet Take 2 tablets (650 mg total) by mouth every 6 (six) hours as needed. Patient not taking: Reported on 04/17/2018 01/18/18   Varney Biles, MD  gabapentin (NEURONTIN) 300 MG capsule Take 300 mg by mouth 3 (three) times daily.     [provider]  omeprazole (PRILOSEC) 40 MG capsule Take 1 capsule (40 mg total) by mouth daily. Patient not taking: Reported on 01/18/2018 10/17/16   Nita Sells, MD  sucralfate (CARAFATE) 1 g tablet Take 1 tablet (1 g total) by mouth 4 (four) times daily -  with meals and at bedtime. Patient not taking: Reported on 04/17/2018 01/18/18   Varney Biles, MD    Family History Family History  Problem Relation Age of Onset   Hypertension Mother    Heart attack Mother    Stroke Mother    Alcohol abuse Father    Mental illness Father    Multiple sclerosis Father    Arthritis Maternal Grandmother    Hypertension Maternal Grandmother    Heart disease Maternal Grandmother    Kidney disease Maternal Grandmother  Diabetes Maternal Grandmother    Cancer Maternal Grandmother        great, type unknown   Hyperlipidemia Maternal Grandfather    Colon polyps Maternal Grandfather    Stroke Paternal Grandmother    Ulcerative colitis Maternal Aunt     Social History Social History   Tobacco Use   Smoking status: Current Every Day Smoker    Types: Cigars   Smokeless tobacco: Never Used  Substance Use Topics   Alcohol use: No    Alcohol/week: 0.0 standard drinks   Drug use: No     Allergies   Patient has no  known allergies.   Review of Systems Review of Systems  Gastrointestinal: Positive for abdominal pain.  All other systems reviewed and are negative.    Physical Exam Updated Vital Signs BP (!) 147/91 (BP Location: Right Arm)    Pulse 74    Temp 98 F (36.7 C) (Oral)    Resp 17    Ht 5' 6"  (1.676 m)    Wt 63.5 kg    SpO2 100%    BMI 22.60 kg/m   Physical Exam Vitals signs and nursing note reviewed.  Constitutional:      General: He is not in acute distress.    Appearance: He is well-developed.  HENT:     Head: Atraumatic.  Eyes:     Conjunctiva/sclera: Conjunctivae normal.  Neck:     Musculoskeletal: Neck supple.  Cardiovascular:     Rate and Rhythm: Normal rate and regular rhythm.  Pulmonary:     Effort: Pulmonary effort is normal.     Breath sounds: Normal breath sounds.  Abdominal:     General: Abdomen is flat.     Palpations: Abdomen is soft.     Tenderness: There is abdominal tenderness in the epigastric area and suprapubic area. There is no guarding or rebound. Negative signs include Murphy's sign and McBurney's sign.     Hernia: No hernia is present.  Skin:    Findings: No rash.  Neurological:     Mental Status: He is alert.      ED Treatments / Results  Labs (all labs ordered are listed, but only abnormal results are displayed) Labs Reviewed  COMPREHENSIVE METABOLIC PANEL - Abnormal; Notable for the following components:      Result Value   Potassium 3.4 (*)    BUN 5 (*)    All other components within normal limits  LIPASE, BLOOD - Abnormal; Notable for the following components:   Lipase 85 (*)    All other components within normal limits  URINALYSIS, ROUTINE W REFLEX MICROSCOPIC - Abnormal; Notable for the following components:   Color, Urine STRAW (*)    Specific Gravity, Urine 1.001 (*)    All other components within normal limits  NOVEL CORONAVIRUS, NAA (HOSPITAL ORDER, SEND-OUT TO REF LAB)  CBC WITH DIFFERENTIAL/PLATELET    EKG     Radiology Ct Abdomen Pelvis W Contrast  Result Date: 07/23/2018 CLINICAL DATA:  Abdominal pain. Personal history of Crohn's disease. EXAM: CT ABDOMEN AND PELVIS WITH CONTRAST TECHNIQUE: Multidetector CT imaging of the abdomen and pelvis was performed using the standard protocol following bolus administration of intravenous contrast. CONTRAST:  168m OMNIPAQUE IOHEXOL 300 MG/ML  SOLN COMPARISON:  CT of the abdomen and pelvis 04/18/2018 FINDINGS: Lower chest: Lung bases are clear without focal nodule, mass, or airspace disease. The heart size is normal. No significant pleural or pericardial effusion is present. Hepatobiliary: No focal liver abnormality is  seen. No gallstones, gallbladder wall thickening, or biliary dilatation. Pancreas: Unremarkable. No pancreatic ductal dilatation or surrounding inflammatory changes. Spleen: Normal in size without focal abnormality. Adrenals/Urinary Tract: Adrenal glands are normal bilaterally. Kidneys and ureters are unremarkable. There is no stone or mass lesion. Urinary bladder demonstrates diffuse thickening and some inflammatory change. No discrete mass is present. Stomach/Bowel: There is focal inflammatory change in the terminal ileum. Marked mucosal thickening is present. A transition point is evident near the umbilicus. There could be an adhesion. Mildly dilated loops of small bowel are present proximal to this level there is at least 1 focal stricture. More proximal small bowel is unremarkable. Colon is mostly decompressed. Vascular/Lymphatic: No significant vascular findings are present. No enlarged abdominal or pelvic lymph nodes. Reproductive: Prostate is unremarkable. Other: No abdominal wall hernia or abnormality. No abdominopelvic ascites. Musculoskeletal: No acute or significant osseous findings. IMPRESSION: 1. Partial small bowel obstruction with a transition point in the ventral abdomen near the umbilicus. 2. Diffuse wall thickening and fluid distention of  terminal ileum to the level of the ileocecal valve with focal inflammation is evident. 3. Mildly dilated loops of small bowel proximal 2 the transition point consistent with partial small bowel obstruction. 4. Diffuse wall thickening of the urinary bladder. Question cystitis. Electronically Signed   By: San Morelle M.D.   On: 07/23/2018 11:04    Procedures Procedures (including critical care time)  Medications Ordered in ED Medications  sodium chloride (PF) 0.9 % injection (has no administration in time range)  ondansetron (ZOFRAN) injection 4 mg (4 mg Intravenous Given 07/23/18 0811)  morphine 4 MG/ML injection 4 mg (4 mg Intravenous Given 07/23/18 0811)  sodium chloride 0.9 % bolus 1,000 mL (1,000 mLs Intravenous New Bag/Given 07/23/18 0810)  iohexol (OMNIPAQUE) 300 MG/ML solution 30 mL (30 mLs Oral Contrast Given 07/23/18 0825)  HYDROmorphone (DILAUDID) injection 1 mg (1 mg Intravenous Given 07/23/18 0924)  iohexol (OMNIPAQUE) 300 MG/ML solution 100 mL (100 mLs Intravenous Contrast Given 07/23/18 1029)     Initial Impression / Assessment and Plan / ED Course  I have reviewed the triage vital signs and the nursing notes.  Pertinent labs & imaging results that were available during my care of the patient were reviewed by me and considered in my medical decision making (see chart for details).        BP (!) 147/91 (BP Location: Right Arm)    Pulse 74    Temp 98 F (36.7 C) (Oral)    Resp 17    Ht 5' 6"  (1.676 m)    Wt 63.5 kg    SpO2 100%    BMI 22.60 kg/m    Final Clinical Impressions(s) / ED Diagnoses   Final diagnoses:  Small bowel obstruction [facility])    ED Discharge Orders    None     8:07 AM Patient with history of Crohn's disease as well as history of prior SBO here with abdominal pain for the past 3 days.  He is well-appearing.  Does have tenderness along his beltline and epigastric region.  Patient will benefit from abdominal pelvis CT scan for evaluation of his  condition.  Pain medication given.  11:30 AM Labs are reassuring, patient is well-appearing, mildly elevated lipase of 85, UA without signs of urinary tract infection, abdominal and pelvis CT scan demonstrate partial small bowel obstruction with transition point in the ventral abdomen needed umbilicus.  Diffuse wall thickening and fluid distention at the terminal ileum with focal inflammation.  Mildly dilated loop of small bowel proximal to the transition point consistent with partial small bowel obstruction.  Also report of diffuse wall thickening of the urinary bladder however UA does not show signs of urinary tract infection.  I did discuss this finding with patient and recommend NG tube and hospital admission.  Patient refused NG tube stating that it creates significant discomfort during the last hospitalization causing him to leave AMA.  Appreciate consultation from general surgery team who will see patient and will be available for consultation.  Will consult hospitalist for admission.  11:52 AM Appreciate consultation from [facility] who agrees to see and admit pt for further care.   12:02 PM Hospitalist has seen pt but was told by pt that he doesn't want to be admitted.  Pt willing to leave AMA, stating he doesn't want NG tube.  I discussed risk of leaving including worsening sxs or death.  Pt voice understanding.  He demonstrate ability to make informed decision.  He understand he can return if condition worsen.  Pt will be discharge AMA.    Domenic Moras, PA-C 07/23/18 1153    Domenic Moras, PA-C 07/23/18 [place], [place], DO 07/26/18 1455

## 2018-07-23 NOTE — ED Notes (Signed)
Pt states his last experience with an NG tube was very bad, and he doesn't want to experience that again, and that is why he is leaving AMA.

## 2018-08-07 ENCOUNTER — Other Ambulatory Visit: Payer: Self-pay

## 2018-08-07 ENCOUNTER — Encounter (HOSPITAL_COMMUNITY): Payer: Self-pay | Admitting: Emergency Medicine

## 2018-08-07 ENCOUNTER — Emergency Department (HOSPITAL_COMMUNITY)
Admission: EM | Admit: 2018-08-07 | Discharge: 2018-08-07 | Disposition: A | Discharge status: Home / Self Care | Payer: Medicare Other | Attending: Emergency Medicine | Admitting: Emergency Medicine

## 2018-08-07 ENCOUNTER — Emergency Department (HOSPITAL_COMMUNITY): Payer: Medicare Other

## 2018-08-07 DIAGNOSIS — R600 Localized edema: Secondary | ICD-10-CM | POA: Insufficient documentation

## 2018-08-07 DIAGNOSIS — I10 Essential (primary) hypertension: Secondary | ICD-10-CM | POA: Insufficient documentation

## 2018-08-07 DIAGNOSIS — F17290 Nicotine dependence, other tobacco product, uncomplicated: Secondary | ICD-10-CM | POA: Diagnosis not present

## 2018-08-07 DIAGNOSIS — M79604 Pain in right leg: Secondary | ICD-10-CM

## 2018-08-07 LAB — COMPREHENSIVE METABOLIC PANEL
ALT: 15 U/L (ref 0–44)
AST: 19 U/L (ref 15–41)
Albumin: 3.8 g/dL (ref 3.5–5.0)
Alkaline Phosphatase: 116 U/L (ref 38–126)
Anion gap: 12 (ref 5–15)
BUN: 5 mg/dL — ABNORMAL LOW (ref 6–20)
CO2: 26 mmol/L (ref 22–32)
Calcium: 9.7 mg/dL (ref 8.9–10.3)
Chloride: 101 mmol/L (ref 98–111)
Creatinine, Ser: 0.94 mg/dL (ref 0.61–1.24)
GFR calc Af Amer: >60 mL/min (ref >60)
GFR calc non Af Amer: >60 mL/min (ref >60)
Glucose, Bld: 71 mg/dL (ref 70–99)
Potassium: 4 mmol/L (ref 3.5–5.1)
Sodium: 139 mmol/L (ref 135–145)
Total Bilirubin: 1 mg/dL (ref 0.3–1.2)
Total Protein: 7.3 g/dL (ref 6.5–8.1)

## 2018-08-07 LAB — CBC WITH DIFFERENTIAL/PLATELET
Abs Immature Granulocytes: 0.03 10*3/uL (ref 0.00–0.07)
Basophils Absolute: 0.1 10*3/uL (ref 0.0–0.1)
Basophils Relative: 1 %
Eosinophils Absolute: 0.3 10*3/uL (ref 0.0–0.5)
Eosinophils Relative: 4 %
HCT: 46.4 % (ref 39.0–52.0)
Hemoglobin: 16.6 g/dL (ref 13.0–17.0)
Immature Granulocytes: 0 %
Lymphocytes Relative: 20 %
Lymphs Abs: 1.5 10*3/uL (ref 0.7–4.0)
MCH: 30.4 pg (ref 26.0–34.0)
MCHC: 35.8 g/dL (ref 30.0–36.0)
MCV: 85 fL (ref 80.0–100.0)
Monocytes Absolute: 0.9 10*3/uL (ref 0.1–1.0)
Monocytes Relative: 12 %
Neutro Abs: 5 10*3/uL (ref 1.7–7.7)
Neutrophils Relative %: 63 %
Platelets: 308 10*3/uL (ref 150–400)
RBC: 5.46 MIL/uL (ref 4.22–5.81)
RDW: 13.7 % (ref 11.5–15.5)
WBC: 7.7 10*3/uL (ref 4.0–10.5)
nRBC: 0 % (ref 0.0–0.2)

## 2018-08-07 MED ORDER — HYDROMORPHONE HCL 1 MG/ML IJ SOLN
1.0000 mg | Freq: Once | INTRAMUSCULAR | Status: AC
  Administered 2018-08-07: 1 mg via INTRAVENOUS
  Filled 2018-08-07: qty 1

## 2018-08-07 MED ORDER — ACETAMINOPHEN 325 MG PO TABS: 650.0000 mg | ORAL_TABLET | Freq: Four times a day (QID) | ORAL | 0 refills | Status: DC | PRN

## 2018-08-07 MED ORDER — COLCHICINE 0.6 MG PO TABS: 0.6000 mg | ORAL_TABLET | Freq: Every day | ORAL | 0 refills | Status: DC

## 2018-08-07 MED ORDER — CEPHALEXIN 500 MG PO CAPS: 500.0000 mg | ORAL_CAPSULE | Freq: Four times a day (QID) | ORAL | 0 refills | Status: DC

## 2018-08-07 MED ORDER — OXYCODONE-ACETAMINOPHEN 5-325 MG PO TABS
1.0000 | ORAL_TABLET | Freq: Once | ORAL | Status: AC
  Administered 2018-08-07: 1 via ORAL
  Filled 2018-08-07: qty 1

## 2018-08-07 MED ORDER — OXYCODONE-ACETAMINOPHEN 5-325 MG PO TABS: 1.0000 | ORAL_TABLET | Freq: Four times a day (QID) | ORAL | 0 refills | Status: DC | PRN

## 2018-08-07 NOTE — ED Triage Notes (Signed)
Patient presents with right ankle swelling with redness/tenderness radiating to right foot and shin , denies injury , no fever or chills .

## 2018-08-07 NOTE — ED Notes (Signed)
Patient transported to X-ray 

## 2018-08-07 NOTE — ED Provider Notes (Signed)
Emergency Department Provider Note   I have reviewed the triage vital signs and the nursing notes.   HISTORY  Chief Complaint Cellulitis   HPI Jeffery Mcintosh is a 38 y.o. male with a history of crohns and supposed gout who presents with redness, pain, swelling and edema to right lower leg and ankle worsening over last few days without associated symptoms. Not tried anything.    No other associated or modifying symptoms.    Past Medical History:  Diagnosis Date  . Anxiety   . Arthritis   . Bipolar affective ([place])   . Chronic headaches   . Crohn's disease ([place]) history of  . Depression   . GERD (gastroesophageal reflux disease)   . Hypertension   . Noncompliance 04/17/2018  . PTSD (post-traumatic stress disorder) 04/17/2018  . Schizophrenic disorder [facility])     Patient Active Problem List   Diagnosis Date Noted  . Crohn's disease of colon with intestinal obstruction ([place])   . Noncompliance 04/17/2018  . PTSD (post-traumatic stress disorder) 04/17/2018  . Crohn's disease of jejunum with intestinal obstruction ([place]) 10/26/2017  . SBO (small bowel obstruction) ([place]) 10/26/2017  . Bipolar 1 disorder ([place]) 10/26/2017  . Crohn's colitis, unspecified complication ([place]) 08/65/7846  . Epigastric burning sensation 01/03/2015  . Numbness and tingling in left arm 11/15/2014  . Exacerbation of Crohn's disease ([place]) 10/21/2014  . Crohn's disease ([place]) 10/21/2014  . Diarrhea 10/21/2014  . Abdominal pain   . Gout 04/18/2013  . Cubital tunnel syndrome 04/18/2013  . Abnormal urinalysis 04/18/2013    Past Surgical History:  Procedure Laterality Date  . NO PAST SURGERIES      Current Outpatient Rx  . Order #: [id number] Class: Historical Med  . Order #: [id number] Class: Historical Med  . Order #: [id number] Class: Normal  . Order #: [id number] Class: Print  . Order #: [id number] Class: Print  . Order #: [id number] Class: Print    Allergies Patient has no known allergies.   Family History  Problem Relation Age of Onset  . Hypertension Mother   . Heart attack Mother   . Stroke Mother   . Alcohol abuse Father   . Mental illness Father   . Multiple sclerosis Father   . Arthritis Maternal Grandmother   . Hypertension Maternal Grandmother   . Heart disease Maternal Grandmother   . Kidney disease Maternal Grandmother   . Diabetes Maternal Grandmother   . Cancer Maternal Grandmother        great, type unknown  . Hyperlipidemia Maternal Grandfather   . Colon polyps Maternal Grandfather   . Stroke Paternal Grandmother   . Ulcerative colitis Maternal Aunt     Social History Social History   Tobacco Use  . Smoking status: Current Every Day Smoker    Types: Cigars  . Smokeless tobacco: Never Used  Substance Use Topics  . Alcohol use: No    Alcohol/week: 0.0 standard drinks  . Drug use: No    Review of Systems  All other systems negative except as documented in the HPI. All pertinent positives and negatives as reviewed in the HPI. ____________________________________________   PHYSICAL EXAM:  VITAL SIGNS: ED Triage Vitals  Enc Vitals Group     BP 08/07/18 0342 (!) 144/88     Pulse Rate 08/07/18 0342 (!) 116     Resp 08/07/18 0342 20     Temp 08/07/18 0342 97.8 F (36.6 C)     Temp Source 08/07/18 0342 Oral     SpO2 08/07/18 0342 100 %  Constitutional: Alert and oriented. Well appearing and in no acute distress. Eyes: Conjunctivae are normal. PERRL. EOMI. Head: Atraumatic. Nose: No congestion/rhinnorhea. Mouth/Throat: Mucous membranes are moist.  Oropharynx non-erythematous. Neck: No stridor.  No meningeal signs.   Cardiovascular: Normal rate, regular rhythm. Good peripheral circulation. Grossly normal heart sounds.   Respiratory: Normal respiratory effort.  No retractions. Lungs CTAB. Gastrointestinal: Soft and nontender. No distention.  Musculoskeletal: No lower extremity tenderness nor edema. No gross deformities of extremities.  Neurologic:  Normal speech and language. No gross focal neurologic deficits are appreciated.  Skin:  Skin is warm, dry and intact. Erythema, warmth, ttp and edema to mid shift to mid foot of right leg.   ____________________________________________   LABS (all labs ordered are listed, but only abnormal results are displayed)  Labs Reviewed  COMPREHENSIVE METABOLIC PANEL - Abnormal; Notable for the following components:      Result Value   BUN 5 (*)    All other components within normal limits  CBC WITH DIFFERENTIAL/PLATELET   ____________________________________________  RADIOLOGY  Dg Ankle Complete Right  Result Date: 08/07/2018 CLINICAL DATA:  Right ankle redness and swelling. EXAM: RIGHT ANKLE - COMPLETE 3+ VIEW COMPARISON:  Right foot x-rays dated April 17, 2013. FINDINGS: No acute fracture or dislocation. The ankle mortise is symmetric. The talar dome is intact. No tibiotalar joint effusion. Joint spaces are preserved. Bone mineralization is normal. Diffuse soft tissue swelling. IMPRESSION: 1. Diffuse soft tissue swelling. No acute osseous abnormality. No joint effusion. Electronically Signed   By: Titus Dubin M.D.   On: 08/07/2018 06:25    ____________________________________________   PROCEDURES  Procedure(s) performed:   Procedures   ____________________________________________   INITIAL IMPRESSION / ASSESSMENT AND PLAN / ED COURSE  Gout vs arthritis vs cellulitis as most likely. Will treat for both. With supportive care as well.   Pertinent labs & imaging results that were available during my care of the patient were reviewed by me and considered in my medical decision making (see chart for details).  A medical screening exam was performed and I feel the patient has had an appropriate workup for their chief complaint at this time and likelihood of emergent condition existing is low. They have been counseled on decision, discharge, follow up and which symptoms  necessitate immediate return to the emergency department. They or their family verbally stated understanding and agreement with plan and discharged in stable condition.   ____________________________________________  FINAL CLINICAL IMPRESSION(S) / ED DIAGNOSES  Final diagnoses:  Pain of right lower extremity     MEDICATIONS GIVEN DURING THIS VISIT:  Medications  HYDROmorphone (DILAUDID) injection 1 mg (1 mg Intravenous Given 08/07/18 0523)  oxyCODONE-acetaminophen (PERCOCET/ROXICET) 5-325 MG per tablet 1 tablet (1 tablet Oral Given 08/07/18 0718)     NEW OUTPATIENT MEDICATIONS STARTED DURING THIS VISIT:  Discharge Medication List as of 08/07/2018  7:03 AM    START taking these medications   Details  cephALEXin (KEFLEX) 500 MG capsule Take 1 capsule (500 mg total) by mouth 4 (four) times daily., Starting Sat 08/07/2018, Print    colchicine 0.6 MG tablet Take 1 tablet (0.6 mg total) by mouth daily., Starting Sat 08/07/2018, Print    oxyCODONE-acetaminophen (PERCOCET) 5-325 MG tablet Take 1 tablet by mouth every 6 (six) hours as needed for severe pain., Starting Sat 08/07/2018, Print        Note:  This note was prepared with assistance of Dragon voice recognition software. Occasional wrong-word or sound-a-like substitutions may have occurred due  to the inherent limitations of voice recognition software.   Rohn Fritsch, Corene Cornea, MD 08/07/18 [phone]

## 2018-09-02 ENCOUNTER — Emergency Department (HOSPITAL_COMMUNITY)
Admission: EM | Admit: 2018-09-02 | Discharge: 2018-09-03 | Disposition: A | Discharge status: Home / Self Care | Payer: Medicare Other | Attending: Emergency Medicine | Admitting: Emergency Medicine

## 2018-09-02 ENCOUNTER — Encounter (HOSPITAL_COMMUNITY): Payer: Self-pay

## 2018-09-02 ENCOUNTER — Other Ambulatory Visit: Payer: Self-pay

## 2018-09-02 DIAGNOSIS — F17290 Nicotine dependence, other tobacco product, uncomplicated: Secondary | ICD-10-CM | POA: Diagnosis not present

## 2018-09-02 DIAGNOSIS — I10 Essential (primary) hypertension: Secondary | ICD-10-CM | POA: Diagnosis not present

## 2018-09-02 DIAGNOSIS — L03116 Cellulitis of left lower limb: Secondary | ICD-10-CM

## 2018-09-02 DIAGNOSIS — M25572 Pain in left ankle and joints of left foot: Secondary | ICD-10-CM | POA: Diagnosis present

## 2018-09-02 DIAGNOSIS — Z79899 Other long term (current) drug therapy: Secondary | ICD-10-CM | POA: Diagnosis not present

## 2018-09-02 MED ORDER — MORPHINE SULFATE (PF) 4 MG/ML IV SOLN
4.0000 mg | Freq: Once | INTRAVENOUS | Status: AC
  Administered 2018-09-03: 4 mg via INTRAVENOUS
  Filled 2018-09-02: qty 1

## 2018-09-02 MED ORDER — KETOROLAC TROMETHAMINE 30 MG/ML IJ SOLN
30.0000 mg | Freq: Once | INTRAMUSCULAR | Status: AC
  Administered 2018-09-03: 30 mg via INTRAVENOUS
  Filled 2018-09-02: qty 1

## 2018-09-02 MED ORDER — SODIUM CHLORIDE 0.9 % IV SOLN
1.0000 g | Freq: Once | INTRAVENOUS | Status: AC
  Administered 2018-09-03: 1 g via INTRAVENOUS
  Filled 2018-09-02: qty 10

## 2018-09-02 MED ORDER — ONDANSETRON HCL 4 MG/2ML IJ SOLN
4.0000 mg | Freq: Once | INTRAMUSCULAR | Status: AC
  Administered 2018-09-03: 4 mg via INTRAVENOUS
  Filled 2018-09-02: qty 2

## 2018-09-02 NOTE — ED Triage Notes (Signed)
Pt complains of left ankle and lower leg swelling since this am, he states that the same happened to the other leg earlier this month and was told it was gout Pt's leg and foot are swollen, red and warm to touch

## 2018-09-02 NOTE — ED Provider Notes (Signed)
TIME SEEN: 11:16 PM  CHIEF COMPLAINT: Left ankle pain  HPI: Patient is a 38 year old male with history of hypertension, schizophrenia, bipolar disorder, Crohn's disease who presents to the emergency department with 2 days of left ankle pain, redness and swelling.  Pain started in the inner left ankle and then redness has spread throughout the dorsal foot and the distal shin.  No fevers or chills.  Had similar symptoms in the right ankle and was diagnosed with gout versus cellulitis and discharged on Keflex and colchicine which helped his symptoms.  He is using crutches to get around at this time.  No injury that he can recall.  He is not a diabetic.  No calf swelling or calf tenderness.  ROS: See HPI Constitutional: no fever  Eyes: no drainage  ENT: no runny nose   Cardiovascular:  no chest pain  Resp: no SOB  GI: no vomiting GU: no dysuria Integumentary: Redness to the right lower extremity Allergy: no hives  Musculoskeletal: Swelling to the right foot and right ankle Neurological: no slurred speech ROS otherwise negative  PAST MEDICAL HISTORY/PAST SURGICAL HISTORY:  Past Medical History:  Diagnosis Date  . Anxiety   . Arthritis   . Bipolar affective ([place])   . Chronic headaches   . Crohn's disease ([place]) history of  . Depression   . GERD (gastroesophageal reflux disease)   . Hypertension   . Noncompliance 04/17/2018  . PTSD (post-traumatic stress disorder) 04/17/2018  . Schizophrenic disorder ([place])     MEDICATIONS:  Prior to Admission medications   Medication Sig Start Date End Date Taking? Authorizing Provider  amphetamine-dextroamphetamine (ADDERALL XR) 20 MG 24 hr capsule Take 20 mg by mouth every morning. 08/02/18  Yes [provider]  acetaminophen (TYLENOL) 325 MG tablet Take 2 tablets (650 mg total) by mouth every 6 (six) hours as needed. Patient not taking: Reported on 09/02/2018 08/07/18   Mesner, Corene Cornea, MD  cephALEXin (KEFLEX) 500 MG capsule Take 1 capsule (500  mg total) by mouth 4 (four) times daily. Patient not taking: Reported on 09/02/2018 08/07/18   Mesner, Corene Cornea, MD  colchicine 0.6 MG tablet Take 1 tablet (0.6 mg total) by mouth daily. Patient not taking: Reported on 09/02/2018 08/07/18   Mesner, Corene Cornea, MD  oxyCODONE-acetaminophen (PERCOCET) 5-325 MG tablet Take 1 tablet by mouth every 6 (six) hours as needed for severe pain. Patient not taking: Reported on 09/02/2018 08/07/18   Mesner, Corene Cornea, MD  omeprazole (PRILOSEC) 40 MG capsule Take 1 capsule (40 mg total) by mouth daily. Patient not taking: Reported on 01/18/2018 10/17/16 08/07/18  Nita Sells, MD  sucralfate (CARAFATE) 1 g tablet Take 1 tablet (1 g total) by mouth 4 (four) times daily -  with meals and at bedtime. Patient not taking: Reported on 04/17/2018 01/18/18 08/07/18  Varney Biles, MD    ALLERGIES:  No Known Allergies  SOCIAL HISTORY:  Social History   Tobacco Use  . Smoking status: Current Every Day Smoker    Types: Cigars  . Smokeless tobacco: Never Used  Substance Use Topics  . Alcohol use: No    Alcohol/week: 0.0 standard drinks    FAMILY HISTORY: Family History  Problem Relation Age of Onset  . Hypertension Mother   . Heart attack Mother   . Stroke Mother   . Alcohol abuse Father   . Mental illness Father   . Multiple sclerosis Father   . Arthritis Maternal Grandmother   . Hypertension Maternal Grandmother   . Heart disease Maternal  Grandmother   . Kidney disease Maternal Grandmother   . Diabetes Maternal Grandmother   . Cancer Maternal Grandmother        great, type unknown  . Hyperlipidemia Maternal Grandfather   . Colon polyps Maternal Grandfather   . Stroke Paternal Grandmother   . Ulcerative colitis Maternal Aunt     EXAM: BP (!) 121/98 (BP Location: Left Arm)   Pulse (!) 107   Temp 98.4 F (36.9 C) (Oral)   Resp 18   SpO2 100%  CONSTITUTIONAL: Alert and oriented and responds appropriately to questions.  Appears uncomfortable HEAD:  Normocephalic EYES: Conjunctivae clear, pupils appear equal, EOMI ENT: normal nose; moist mucous membranes NECK: Supple, no meningismus, no nuchal rigidity, no LAD  CARD: RRR; S1 and S2 appreciated; no murmurs, no clicks, no rubs, no gallops RESP: Normal chest excursion without splinting or tachypnea; breath sounds clear and equal bilaterally; no wheezes, no rhonchi, no rales, no hypoxia or respiratory distress, speaking full sentences ABD/GI: Normal bowel sounds; non-distended; soft, non-tender, no rebound, no guarding, no peritoneal signs, no hepatosplenomegaly BACK:  The back appears normal and is non-tender to palpation, there is no CVA tenderness EXT: Patient's left ankle is extremely tender to even light palpation.  He has erythema and warmth throughout the dorsal left foot, throughout the left ankle and the distal left shin.  No calf tenderness or calf swelling.  Compartments are soft.  2+ left DP pulse.  No obvious bony deformity, ecchymosis.  No significant joint effusion of the left ankle.  Normal sensation in the left leg. SKIN: Normal color for age and race; warm; no rash NEURO: Moves all extremities equally PSYCH: The patient's mood and manner are appropriate. Grooming and personal hygiene are appropriate.  MEDICAL DECISION MAKING: Patient here with left ankle erythema, warmth, swelling, pain.  I suspect that this is likely gout but given the redness goes into his dorsal foot and his distal shin, will give him Rocephin for possible cellulitis.  It appears he improved with Keflex and colchicine previously.  He denies fevers.  He is not a diabetic.  Will check labs today and obtain x-ray.  He is neurovascularly intact distally.  ED PROGRESS: Patient reports feeling better.  Labs reassuring.  No leukocytosis.  Normal creatinine.  X-ray shows no bony abnormality only soft tissue swelling noted.  Will discharge with Indocin, colchicine, Percocet, Keflex.  Will give outpatient PCP follow-up.   Recommended diet changes for possible gout.  Patient verbalized understanding and is comfortable with this plan.  At this time, I do not feel there is any life-threatening condition present. I have reviewed and discussed all results (EKG, imaging, lab, urine as appropriate) and exam findings with patient/family. I have reviewed nursing notes and appropriate previous records.  I feel the patient is safe to be discharged home without further emergent workup and can continue workup as an outpatient as needed. Discussed usual and customary return precautions. Patient/family verbalize understanding and are comfortable with this plan.  Outpatient follow-up has been provided as needed. All questions have been answered.      Ward, Delice Bison, DO 09/03/18 [phone]

## 2018-09-03 ENCOUNTER — Emergency Department (HOSPITAL_COMMUNITY): Payer: Medicare Other

## 2018-09-03 DIAGNOSIS — L03116 Cellulitis of left lower limb: Secondary | ICD-10-CM | POA: Diagnosis not present

## 2018-09-03 LAB — CBC WITH DIFFERENTIAL/PLATELET
Abs Immature Granulocytes: 0.03 10*3/uL (ref 0.00–0.07)
Basophils Absolute: 0 10*3/uL (ref 0.0–0.1)
Basophils Relative: 0 %
Eosinophils Absolute: 0.2 10*3/uL (ref 0.0–0.5)
Eosinophils Relative: 3 %
HCT: 43.8 % (ref 39.0–52.0)
Hemoglobin: 14.9 g/dL (ref 13.0–17.0)
Immature Granulocytes: 0 %
Lymphocytes Relative: 18 %
Lymphs Abs: 1.3 10*3/uL (ref 0.7–4.0)
MCH: 29.2 pg (ref 26.0–34.0)
MCHC: 34 g/dL (ref 30.0–36.0)
MCV: 85.7 fL (ref 80.0–100.0)
Monocytes Absolute: 1 10*3/uL (ref 0.1–1.0)
Monocytes Relative: 14 %
Neutro Abs: 4.8 10*3/uL (ref 1.7–7.7)
Neutrophils Relative %: 65 %
Platelets: 261 10*3/uL (ref 150–400)
RBC: 5.11 MIL/uL (ref 4.22–5.81)
RDW: 13.4 % (ref 11.5–15.5)
WBC: 7.4 10*3/uL (ref 4.0–10.5)
nRBC: 0 % (ref 0.0–0.2)

## 2018-09-03 LAB — BASIC METABOLIC PANEL
Anion gap: 9 (ref 5–15)
BUN: 7 mg/dL (ref 6–20)
CO2: 30 mmol/L (ref 22–32)
Calcium: 9 mg/dL (ref 8.9–10.3)
Chloride: 101 mmol/L (ref 98–111)
Creatinine, Ser: 0.85 mg/dL (ref 0.61–1.24)
GFR calc Af Amer: >60 mL/min (ref >60)
GFR calc non Af Amer: >60 mL/min (ref >60)
Glucose, Bld: 89 mg/dL (ref 70–99)
Potassium: 3.7 mmol/L (ref 3.5–5.1)
Sodium: 140 mmol/L (ref 135–145)

## 2018-09-03 MED ORDER — INDOMETHACIN 25 MG PO CAPS
50.0000 mg | ORAL_CAPSULE | Freq: Once | ORAL | Status: AC
  Administered 2018-09-03: 50 mg via ORAL
  Filled 2018-09-03: qty 2

## 2018-09-03 MED ORDER — ONDANSETRON 4 MG PO TBDP: 4.0000 mg | ORAL_TABLET | Freq: Four times a day (QID) | ORAL | 0 refills | Status: DC | PRN

## 2018-09-03 MED ORDER — INDOMETHACIN 25 MG PO CAPS: 25.0000 mg | ORAL_CAPSULE | Freq: Three times a day (TID) | ORAL | 0 refills | Status: DC | PRN

## 2018-09-03 MED ORDER — COLCHICINE 0.6 MG PO TABS: 0.6000 mg | ORAL_TABLET | Freq: Every day | ORAL | 0 refills | Status: DC

## 2018-09-03 MED ORDER — COLCHICINE 0.6 MG PO TABS
1.2000 mg | ORAL_TABLET | Freq: Once | ORAL | Status: AC
  Administered 2018-09-03: 1.2 mg via ORAL
  Filled 2018-09-03: qty 2

## 2018-09-03 MED ORDER — OXYCODONE-ACETAMINOPHEN 5-325 MG PO TABS: 2.0000 | ORAL_TABLET | Freq: Four times a day (QID) | ORAL | 0 refills | Status: DC | PRN

## 2018-09-03 MED ORDER — CEPHALEXIN 500 MG PO CAPS: 500.0000 mg | ORAL_CAPSULE | Freq: Four times a day (QID) | ORAL | 0 refills | Status: DC

## 2018-09-03 NOTE — Discharge Instructions (Signed)
Steps to find a Primary Care Provider (PCP):  Call [phone] or [phone] to access "[place] [facility]."  2.  You may also go on the [facility] website at [email or web address]  3.   and Wellness also frequently accepts new patients.  [place] [phone]  4.  There are also multiple [facility], [facility] and [facility] practices throughout the [place] that are frequently accepting new patients. You may find a clinic that is close to your home and contact them.  [facility] [email or web address] [phone]  [facility] [email or web address]  [facility] [email or web address] [place] [email or web address] [phone]  5.  Local Health Departments also can provide primary care services.  [facility]  [place] [address] [phone]  [facility] [place] [address] [place] [facility] [place] [facility] [place] [phone]

## 2018-10-11 ENCOUNTER — Emergency Department (HOSPITAL_COMMUNITY): Payer: Medicare Other

## 2018-10-11 ENCOUNTER — Other Ambulatory Visit: Payer: Self-pay

## 2018-10-11 ENCOUNTER — Inpatient Hospital Stay (HOSPITAL_COMMUNITY)
Admission: EM | Admit: 2018-10-11 | Discharge: 2018-10-11 | DRG: 387 | Discharge status: Against Medical Advice | Payer: Medicare Other | Attending: Internal Medicine | Admitting: Internal Medicine

## 2018-10-11 ENCOUNTER — Encounter (HOSPITAL_COMMUNITY): Payer: Self-pay | Admitting: Emergency Medicine

## 2018-10-11 DIAGNOSIS — Z8261 Family history of arthritis: Secondary | ICD-10-CM

## 2018-10-11 DIAGNOSIS — K56609 Unspecified intestinal obstruction, unspecified as to partial versus complete obstruction: Secondary | ICD-10-CM

## 2018-10-11 DIAGNOSIS — Z818 Family history of other mental and behavioral disorders: Secondary | ICD-10-CM

## 2018-10-11 DIAGNOSIS — Z8249 Family history of ischemic heart disease and other diseases of the circulatory system: Secondary | ICD-10-CM

## 2018-10-11 DIAGNOSIS — M1990 Unspecified osteoarthritis, unspecified site: Secondary | ICD-10-CM | POA: Diagnosis present

## 2018-10-11 DIAGNOSIS — F319 Bipolar disorder, unspecified: Secondary | ICD-10-CM | POA: Diagnosis present

## 2018-10-11 DIAGNOSIS — K50112 Crohn's disease of large intestine with intestinal obstruction: Principal | ICD-10-CM | POA: Diagnosis present

## 2018-10-11 DIAGNOSIS — Z20828 Contact with and (suspected) exposure to other viral communicable diseases: Secondary | ICD-10-CM | POA: Diagnosis present

## 2018-10-11 DIAGNOSIS — K219 Gastro-esophageal reflux disease without esophagitis: Secondary | ICD-10-CM | POA: Diagnosis present

## 2018-10-11 DIAGNOSIS — Z5329 Procedure and treatment not carried out because of patient's decision for other reasons: Secondary | ICD-10-CM | POA: Diagnosis present

## 2018-10-11 DIAGNOSIS — F4310 Post-traumatic stress disorder, unspecified: Secondary | ICD-10-CM | POA: Diagnosis present

## 2018-10-11 DIAGNOSIS — Z9119 Patient's noncompliance with other medical treatment and regimen: Secondary | ICD-10-CM

## 2018-10-11 DIAGNOSIS — F17290 Nicotine dependence, other tobacco product, uncomplicated: Secondary | ICD-10-CM | POA: Diagnosis present

## 2018-10-11 DIAGNOSIS — I10 Essential (primary) hypertension: Secondary | ICD-10-CM | POA: Diagnosis present

## 2018-10-11 DIAGNOSIS — Z79899 Other long term (current) drug therapy: Secondary | ICD-10-CM

## 2018-10-11 DIAGNOSIS — F209 Schizophrenia, unspecified: Secondary | ICD-10-CM | POA: Diagnosis present

## 2018-10-11 LAB — COMPREHENSIVE METABOLIC PANEL
ALT: 16 U/L (ref 0–44)
AST: 17 U/L (ref 15–41)
Albumin: 3.4 g/dL — ABNORMAL LOW (ref 3.5–5.0)
Alkaline Phosphatase: 108 U/L (ref 38–126)
Anion gap: 10 (ref 5–15)
BUN: 6 mg/dL (ref 6–20)
CO2: 30 mmol/L (ref 22–32)
Calcium: 11 mg/dL — ABNORMAL HIGH (ref 8.9–10.3)
Chloride: 100 mmol/L (ref 98–111)
Creatinine, Ser: 0.98 mg/dL (ref 0.61–1.24)
GFR calc Af Amer: >60 mL/min (ref >60)
GFR calc non Af Amer: >60 mL/min (ref >60)
Glucose, Bld: 112 mg/dL — ABNORMAL HIGH (ref 70–99)
Potassium: 4.3 mmol/L (ref 3.5–5.1)
Sodium: 140 mmol/L (ref 135–145)
Total Bilirubin: 1 mg/dL (ref 0.3–1.2)
Total Protein: 6.8 g/dL (ref 6.5–8.1)

## 2018-10-11 LAB — CBC
HCT: 41.3 % (ref 39.0–52.0)
Hemoglobin: 14.2 g/dL (ref 13.0–17.0)
MCH: 29.6 pg (ref 26.0–34.0)
MCHC: 34.4 g/dL (ref 30.0–36.0)
MCV: 86 fL (ref 80.0–100.0)
Platelets: 259 10*3/uL (ref 150–400)
RBC: 4.8 MIL/uL (ref 4.22–5.81)
RDW: 13.9 % (ref 11.5–15.5)
WBC: 10.3 10*3/uL (ref 4.0–10.5)
nRBC: 0 % (ref 0.0–0.2)

## 2018-10-11 LAB — RAPID URINE DRUG SCREEN, HOSP PERFORMED
Amphetamines: NOT DETECTED
Barbiturates: NOT DETECTED
Benzodiazepines: NOT DETECTED
Cocaine: NOT DETECTED
Opiates: POSITIVE — AB
Tetrahydrocannabinol: NOT DETECTED

## 2018-10-11 LAB — TROPONIN I (HIGH SENSITIVITY)
Troponin I (High Sensitivity): 4 ng/L (ref <18)
Troponin I (High Sensitivity): 5 ng/L (ref <18)

## 2018-10-11 LAB — URINALYSIS, ROUTINE W REFLEX MICROSCOPIC
Bilirubin Urine: NEGATIVE
Glucose, UA: NEGATIVE mg/dL
Hgb urine dipstick: NEGATIVE
Ketones, ur: NEGATIVE mg/dL
Leukocytes,Ua: NEGATIVE
Nitrite: NEGATIVE
Protein, ur: NEGATIVE mg/dL
Specific Gravity, Urine: 1.013 (ref 1.005–1.030)
pH: 9 — ABNORMAL HIGH (ref 5.0–8.0)

## 2018-10-11 LAB — SARS CORONAVIRUS 2 BY RT PCR (HOSPITAL ORDER, PERFORMED IN ~~LOC~~ HOSPITAL LAB): SARS Coronavirus 2: NEGATIVE

## 2018-10-11 LAB — LIPASE, BLOOD: Lipase: 20 U/L (ref 11–51)

## 2018-10-11 MED ORDER — ACETAMINOPHEN 325 MG PO TABS: 650.0000 mg | ORAL_TABLET | Freq: Four times a day (QID) | ORAL | Status: DC | PRN

## 2018-10-11 MED ORDER — ACETAMINOPHEN 650 MG RE SUPP: 650.0000 mg | Freq: Four times a day (QID) | RECTAL | Status: DC | PRN

## 2018-10-11 MED ORDER — IOHEXOL 300 MG/ML  SOLN
100.0000 mL | Freq: Once | INTRAMUSCULAR | Status: AC | PRN
  Administered 2018-10-11: 100 mL via INTRAVENOUS

## 2018-10-11 MED ORDER — LACTATED RINGERS IV SOLN
INTRAVENOUS | Status: DC
  Administered 2018-10-11: 14:00:00 via INTRAVENOUS

## 2018-10-11 MED ORDER — MORPHINE SULFATE (PF) 4 MG/ML IV SOLN
4.0000 mg | Freq: Once | INTRAVENOUS | Status: AC
  Administered 2018-10-11: 4 mg via INTRAVENOUS
  Filled 2018-10-11: qty 1

## 2018-10-11 MED ORDER — SODIUM CHLORIDE 0.9% FLUSH: 3.0000 mL | Freq: Once | INTRAVENOUS | Status: DC

## 2018-10-11 MED ORDER — ONDANSETRON HCL 4 MG/2ML IJ SOLN: 4.0000 mg | Freq: Four times a day (QID) | INTRAMUSCULAR | Status: DC | PRN

## 2018-10-11 MED ORDER — HYDROMORPHONE HCL 1 MG/ML IJ SOLN: 0.5000 mg | INTRAMUSCULAR | Status: DC | PRN

## 2018-10-11 MED ORDER — ONDANSETRON HCL 4 MG PO TABS: 4.0000 mg | ORAL_TABLET | Freq: Four times a day (QID) | ORAL | Status: DC | PRN

## 2018-10-11 MED ORDER — ONDANSETRON HCL 4 MG/2ML IJ SOLN
4.0000 mg | Freq: Once | INTRAMUSCULAR | Status: AC
  Administered 2018-10-11: 4 mg via INTRAVENOUS
  Filled 2018-10-11: qty 2

## 2018-10-11 MED ORDER — SODIUM CHLORIDE 0.9 % IV BOLUS
1000.0000 mL | Freq: Once | INTRAVENOUS | Status: AC
  Administered 2018-10-11: 1000 mL via INTRAVENOUS

## 2018-10-11 MED ORDER — HYDROMORPHONE HCL 1 MG/ML IJ SOLN
0.5000 mg | Freq: Once | INTRAMUSCULAR | Status: AC
  Administered 2018-10-11: 0.5 mg via INTRAVENOUS
  Filled 2018-10-11: qty 1

## 2018-10-11 MED ORDER — METHYLPREDNISOLONE SODIUM SUCC 125 MG IJ SOLR: 60.0000 mg | Freq: Two times a day (BID) | INTRAMUSCULAR | Status: DC

## 2018-10-11 MED ORDER — MORPHINE SULFATE (PF) 2 MG/ML IV SOLN
2.0000 mg | INTRAVENOUS | Status: DC | PRN
  Administered 2018-10-11 (×2): 2 mg via INTRAVENOUS
  Filled 2018-10-11 (×2): qty 1

## 2018-10-11 NOTE — Consult Note (Signed)
[facility] Consultation Note  Referring Provider: [facility] Primary Care Physician:  Patient, No Pcp Per Primary Gastroenterologist:  None  Reason for Consultation:  Crohn's flare  HPI: Danne Vasek is a 38 y.o. male admitted with Crohn's flare.  He was diagnosed with Crohn's disease over 20 years ago.  He has been on variety of medications (including several biologics) for his Crohn's disease, and has seen multiple gastroenterologists.  Patient has seen [facility] gastroenterology but was dismissed early 2020 due to medical non-compliance.  Patient reports several days' of generalized abdominal pain, much worse over the past couple days.  Was having some abdominal distention as well.  Nausea and vomiting yesterday, but none now.  No bowel movements, but is passing some flatus.  No blood in stool. Pain not appreciably improved since admission.   Past Medical History:  Diagnosis Date  . Anxiety   . Arthritis   . Bipolar affective ([place])   . Chronic headaches   . Crohn's disease ([place]) history of  . Depression   . GERD (gastroesophageal reflux disease)   . Hypertension   . Noncompliance 04/17/2018  . PTSD (post-traumatic stress disorder) 04/17/2018  . Schizophrenic disorder [facility])     Past Surgical History:  Procedure Laterality Date  . NO PAST SURGERIES      Prior to Admission medications   Medication Sig Start Date End Date Taking? Authorizing Provider  amphetamine-dextroamphetamine (ADDERALL XR) 20 MG 24 hr capsule Take 20 mg by mouth every morning. 08/02/18  Yes [provider]  gabapentin (NEURONTIN) 300 MG capsule Take 300 mg by mouth 2 (two) times daily.   Yes [provider]  Multiple Vitamin (MULTIVITAMIN WITH MINERALS) TABS tablet Take 1 tablet by mouth daily.   Yes [provider]  omeprazole (PRILOSEC) 40 MG capsule Take 1 capsule (40 mg total) by mouth daily. Patient not taking: Reported on 01/18/2018 10/17/16 08/07/18  Nita Sells, MD  sucralfate (CARAFATE) 1 g tablet Take 1 tablet (1 g total) by mouth 4 (four) times daily -  with meals and at bedtime. Patient not taking: Reported on 04/17/2018 01/18/18 08/07/18  Varney Biles, MD    Current Facility-Administered Medications  Medication Dose Route Frequency Provider Last Rate Last Dose  . acetaminophen (TYLENOL) tablet 650 mg  650 mg Oral Q6H PRN Karmen Bongo, MD       Or  . acetaminophen (TYLENOL) suppository 650 mg  650 mg Rectal Q6H PRN Karmen Bongo, MD      . lactated ringers infusion   Intravenous Continuous Karmen Bongo, MD 75 mL/hr at 10/11/18 1402    . morphine 2 MG/ML injection 2 mg  2 mg Intravenous Q2H PRN Karmen Bongo, MD   2 mg at 10/11/18 1309  . ondansetron (ZOFRAN) tablet 4 mg  4 mg Oral Q6H PRN Karmen Bongo, MD       Or  . ondansetron [facility]) injection 4 mg  4 mg Intravenous Q6H PRN Karmen Bongo, MD        Allergies as of 10/11/2018  . (No Known Allergies)    Family History  Problem Relation Age of Onset  . Hypertension Mother   . Heart attack Mother   . Stroke Mother   . Alcohol abuse Father   . Mental illness Father   . Multiple sclerosis Father   . Arthritis Maternal Grandmother   . Hypertension Maternal Grandmother   . Heart disease Maternal Grandmother   . Kidney disease Maternal Grandmother   . Diabetes Maternal  Grandmother   . Cancer Maternal Grandmother        great, type unknown  . Hyperlipidemia Maternal Grandfather   . Colon polyps Maternal Grandfather   . Stroke Paternal Grandmother   . Ulcerative colitis Maternal Aunt     Social History   Socioeconomic History  . Marital status: Single    Spouse name: Not on file  . Number of children: 2  . Years of education: 51  . Highest education level: Not on file  Occupational History  . Occupation: disabled    Comment: Crohn's / Bipolar / Schizophrenia  Social Needs  . Financial resource strain: Not on file  . Food insecurity    Worry:  Not on file    Inability: Not on file  . Transportation needs    Medical: Not on file    Non-medical: Not on file  Tobacco Use  . Smoking status: Former Smoker    Packs/day: 0.50    Years: 15.00    Pack years: 7.50    Types: Cigars  . Smokeless tobacco: Never Used  Substance and Sexual Activity  . Alcohol use: No    Alcohol/week: 0.0 standard drinks  . Drug use: No  . Sexual activity: Never    Birth control/protection: None  Lifestyle  . Physical activity    Days per week: Not on file    Minutes per session: Not on file  . Stress: Not on file  Relationships  . Social Herbalist on phone: Not on file    Gets together: Not on file    Attends religious service: Not on file    Active member of club or organization: Not on file    Attends meetings of clubs or organizations: Not on file    Relationship status: Not on file  . Intimate partner violence    Fear of current or ex partner: Not on file    Emotionally abused: Not on file    Physically abused: Not on file    Forced sexual activity: Not on file  Other Topics Concern  . Not on file  Social History Narrative  . Not on file    Review of Systems: As per HPI, all others negative  Physical Exam: Vital signs in last 24 hours: Temp:  [98.9 F (37.2 C)-100 F (37.8 C)] 99 F (37.2 C) (09/07 1333) Pulse Rate:  [85-98] 85 (09/07 1333) Resp:  [16-20] 16 (09/07 1333) BP: (116-143)/(70-101) 118/70 (09/07 1333) SpO2:  [96 %-99 %] 98 % (09/07 1333) Weight:  [71.6 kg] 71.6 kg (09/07 1333)   General:   Alert,  Standing upon (most comfortable for patient; it hurts too much to lie down), NAD Head:  Normocephalic and atraumatic. Eyes:  Sclera clear, no icterus.   Conjunctiva pink. Ears:  Normal auditory acuity. Nose:  No deformity, discharge,  or lesions. Mouth:  No deformity or lesions.  Oropharynx pink & moist. Neck:  Supple; no masses or thyromegaly. Lungs:  Clear throughout to auscultation.   No wheezes,  crackles, or rhonchi. No acute distress. Heart:  Regular rate and rhythm; no murmurs, clicks, rubs,  or gallops. Abdomen:  Protuberant and tympanic, minimal bowel sounds, mild diffuse tenderness but no peritonitis; No masses, hepatosplenomegaly or hernias noted. Normal bowel sounds, without guarding, and without rebound.     Msk:  Symmetrical without gross deformities. Normal posture. Pulses:  Normal pulses noted. Extremities:  Without clubbing or edema. Neurologic:  Alert and  oriented x4;  grossly  normal neurologically. Skin:  Intact without significant lesions or rashes. Cervical Nodes:  No significant cervical adenopathy. Psych:  Alert and cooperative. Depressed mood, flat affect   Lab Results: Recent Labs    10/11/18 0305  WBC 10.3  HGB 14.2  HCT 41.3  PLT 259   BMET Recent Labs    10/11/18 0305  NA 140  K 4.3  CL 100  CO2 30  GLUCOSE 112*  BUN 6  CREATININE 0.98  CALCIUM 11.0*   LFT Recent Labs    10/11/18 0305  PROT 6.8  ALBUMIN 3.4*  AST 17  ALT 16  ALKPHOS 108  BILITOT 1.0   PT/INR No results for input(s): LABPROT, INR in the last 72 hours.  Studies/Results: Ct Abdomen Pelvis W Contrast  Result Date: 10/11/2018 CLINICAL DATA:  Acute generalized abdominal pain. Fever. Crohn's disease. EXAM: CT ABDOMEN AND PELVIS WITH CONTRAST TECHNIQUE: Multidetector CT imaging of the abdomen and pelvis was performed using the standard protocol following bolus administration of intravenous contrast. CONTRAST:  167m OMNIPAQUE IOHEXOL 300 MG/ML  SOLN COMPARISON:  07/23/2018 FINDINGS: Lower Chest: No acute findings. Hepatobiliary: No hepatic masses identified. Gallbladder is unremarkable. No evidence of biliary ductal dilatation. Pancreas:  No mass or inflammatory changes. Spleen: Within normal limits in size and appearance. Adrenals/Urinary Tract: No masses identified. No evidence of hydronephrosis. Stomach/Bowel: Mild-to-moderate dilatation of multiple distal small bowel  loops shows no significant change. Wall thickening and increased mucosal enhancement is again seen involving the terminal ileum, also without significant change. Transition point is again seen in the area of the terminal ileum, consistent with stricture causing a distal small bowel obstruction. In addition, there there is a matted appearance of the thickened distal small bowel loops in the right lower quadrant, with connecting linear opacities highly suspicious for entero-enteric fistulae. Vascular/Lymphatic: No pathologically enlarged lymph nodes. No abdominal aortic aneurysm. Reproductive:  No mass or other significant abnormality. Other:  None. Musculoskeletal:  No suspicious bone lesions identified. IMPRESSION: 1. No significant change in active Crohn's disease involving distal small bowel, with stricture causing distal small bowel obstruction. 2. "Matted" distal small bowel loops, with signs of entero-enteric fistulae. No evidence of abscess. Electronically Signed   By: JMarlaine HindM.D.   On: 10/11/2018 10:30    Impression:  1.  Crohn's flare with partial small bowel obstruction.   2.  Abnormal imaging:  CT with enteroenteric fistulae and "matted" distal small bowel loops. 3.  Medical non-compliance; has seen multiple different gastroenterologists, and been on multiple different therapies.  [facility] we are seeing him in hospital as an "unassigned" patient.  Plan:  1.  Sips ice chips only. 2.  Continue IV fluids and antiemetics. 3.  Start IV steroids (no evidence of obvious abscess or infectious nidus). 4.  Minimize use of narcotics. 5.  I am very concerned that, despite patient not being completely obstructed, whether he will be able to resolve his symptoms with medical therapy only.  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 6.  Regardless,  patient will need close outpatient follow-up; [facility]  Medical compliance is crucial. 7.  [facility] will follow.   LOS: 0 days   Laird Runnion M  10/11/2018, 3:12 PM  Cell [phone] no answer or after 5 PM call [phone]

## 2018-10-11 NOTE — H&P (Signed)
History and Physical    Jeffery Mcintosh OZH:[id number] DOB: 1980/06/05 DOA: 10/11/2018  PCP: Patient, No Pcp Per Consultants:  None Patient coming from:  Home - lives with mother; Donald Prose: Mother, [phone]  Chief Complaint: abdominal pain  HPI: Jeffery Mcintosh is a 38 y.o. male with medical history significant of schizophrenia; PTSD; bipolar; HTN; SBO; and Crohn's presenting with abdominal pain.  He is having a Crohn's flare and blockage.  He noticed abdominal pain for about 2 months with worsening, weight loss of about 20 pounds.  He has been told about a blockage in the past and he noticed this in the last 2 days - has has severe abdominal pain whenever he tries to swallow.  +nausea/vomiting, awakens with vomiting, 3 times last night.  Last BM was normal yesterday.  No fever.  No COVID exposures, generally following guidelines to avoid infection.    ED Course:   Intermittent abdominal pain x 2 months, worse x 2 days.  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  Dr. Cristina Gong will see, surgery to see.  [place] admission requested.  Needs pain control.  No evidence of sepsis.    Review of Systems: As per HPI; otherwise review of systems reviewed and negative.   Ambulatory Status:  Ambulates without assistance  Past Medical History:  Diagnosis Date  . Anxiety   . Arthritis   . Bipolar affective ([place])   . Chronic headaches   . Crohn's disease ([place]) history of  . Depression   . GERD (gastroesophageal reflux disease)   . Hypertension   . Noncompliance 04/17/2018  . PTSD (post-traumatic stress disorder) 04/17/2018  . Schizophrenic disorder [facility])     Past Surgical History:  Procedure Laterality Date  . NO PAST SURGERIES      Social History   Socioeconomic History  . Marital status: Single    Spouse name: Not on file  . Number of children: 2  . Years of education: 24  . Highest education level: Not on file  Occupational History  . Occupation: disabled    Comment:  Crohn's / Bipolar / Schizophrenia  Social Needs  . Financial resource strain: Not on file  . Food insecurity    Worry: Not on file    Inability: Not on file  . Transportation needs    Medical: Not on file    Non-medical: Not on file  Tobacco Use  . Smoking status: Former Smoker    Packs/day: 0.50    Years: 15.00    Pack years: 7.50    Types: Cigars  . Smokeless tobacco: Never Used  Substance and Sexual Activity  . Alcohol use: No    Alcohol/week: 0.0 standard drinks  . Drug use: No  . Sexual activity: Never    Birth control/protection: None  Lifestyle  . Physical activity    Days per week: Not on file    Minutes per session: Not on file  . Stress: Not on file  Relationships  . Social Herbalist on phone: Not on file    Gets together: Not on file    Attends religious service: Not on file    Active member of club or organization: Not on file    Attends meetings of clubs or organizations: Not on file    Relationship status: Not on file  . Intimate partner violence    Fear of current or ex partner: Not on file    Emotionally abused: Not on file    Physically abused:  Not on file    Forced sexual activity: Not on file  Other Topics Concern  . Not on file  Social History Narrative  . Not on file    No Known Allergies  Family History  Problem Relation Age of Onset  . Hypertension Mother   . Heart attack Mother   . Stroke Mother   . Alcohol abuse Father   . Mental illness Father   . Multiple sclerosis Father   . Arthritis Maternal Grandmother   . Hypertension Maternal Grandmother   . Heart disease Maternal Grandmother   . Kidney disease Maternal Grandmother   . Diabetes Maternal Grandmother   . Cancer Maternal Grandmother        great, type unknown  . Hyperlipidemia Maternal Grandfather   . Colon polyps Maternal Grandfather   . Stroke Paternal Grandmother   . Ulcerative colitis Maternal Aunt     Prior to Admission medications   Medication Sig  Start Date End Date Taking? Authorizing Provider  amphetamine-dextroamphetamine (ADDERALL XR) 20 MG 24 hr capsule Take 20 mg by mouth every morning. 08/02/18  Yes [provider]  gabapentin (NEURONTIN) 300 MG capsule Take 300 mg by mouth 2 (two) times daily.   Yes [provider]  Multiple Vitamin (MULTIVITAMIN WITH MINERALS) TABS tablet Take 1 tablet by mouth daily.   Yes [provider]  omeprazole (PRILOSEC) 40 MG capsule Take 1 capsule (40 mg total) by mouth daily. Patient not taking: Reported on 01/18/2018 10/17/16 08/07/18  Nita Sells, MD  sucralfate (CARAFATE) 1 g tablet Take 1 tablet (1 g total) by mouth 4 (four) times daily -  with meals and at bedtime. Patient not taking: Reported on 04/17/2018 01/18/18 08/07/18  Varney Biles, MD    Physical Exam: Vitals:   10/11/18 0830 10/11/18 0900 10/11/18 1117 10/11/18 1333  BP: 119/77 (!) 116/101 124/86 118/70  Pulse: 88 91 96 85  Resp:   20 16  Temp:    99 F (37.2 C)  TempSrc:    Oral  SpO2: 98% 96% 97% 98%  Weight:    71.6 kg  Height:    5' 5"  (1.651 m)     . General:  Appears calm and comfortable and is NAD . Eyes:  PERRL, EOMI, normal lids, iris . ENT:  grossly normal hearing, lips & tongue, mmm . Neck:  no LAD, masses or thyromegaly . Cardiovascular:  RRR, no m/r/g. No LE edema.  Marland Kitchen Respiratory:   CTA bilaterally with no wheezes/rales/rhonchi.  Normal respiratory effort. . Abdomen:  soft, diffusely TTP, hypoactive BS . Back:   normal alignment, no CVAT . Skin:  no rash or induration seen on limited exam . Musculoskeletal:  grossly normal tone BUE/BLE, good ROM, no bony abnormality . Psychiatric:  grossly normal mood and affect, speech fluent and appropriate, AOx3 . Neurologic:  CN 2-12 grossly intact, moves all extremities in coordinated fashion, sensation intact    Radiological Exams on Admission: Ct Abdomen Pelvis W Contrast  Result Date: 10/11/2018 CLINICAL DATA:  Acute generalized  abdominal pain. Fever. Crohn's disease. EXAM: CT ABDOMEN AND PELVIS WITH CONTRAST TECHNIQUE: Multidetector CT imaging of the abdomen and pelvis was performed using the standard protocol following bolus administration of intravenous contrast. CONTRAST:  161m OMNIPAQUE IOHEXOL 300 MG/ML  SOLN COMPARISON:  07/23/2018 FINDINGS: Lower Chest: No acute findings. Hepatobiliary: No hepatic masses identified. Gallbladder is unremarkable. No evidence of biliary ductal dilatation. Pancreas:  No mass or inflammatory changes. Spleen: Within normal limits in size  and appearance. Adrenals/Urinary Tract: No masses identified. No evidence of hydronephrosis. Stomach/Bowel: Mild-to-moderate dilatation of multiple distal small bowel loops shows no significant change. Wall thickening and increased mucosal enhancement is again seen involving the terminal ileum, also without significant change. Transition point is again seen in the area of the terminal ileum, consistent with stricture causing a distal small bowel obstruction. In addition, there there is a matted appearance of the thickened distal small bowel loops in the right lower quadrant, with connecting linear opacities highly suspicious for entero-enteric fistulae. Vascular/Lymphatic: No pathologically enlarged lymph nodes. No abdominal aortic aneurysm. Reproductive:  No mass or other significant abnormality. Other:  None. Musculoskeletal:  No suspicious bone lesions identified. IMPRESSION: 1. No significant change in active Crohn's disease involving distal small bowel, with stricture causing distal small bowel obstruction. 2. "Matted" distal small bowel loops, with signs of entero-enteric fistulae. No evidence of abscess. Electronically Signed   By: Marlaine Hind M.D.   On: 10/11/2018 10:30    EKG: Independently reviewed.  NSR with rate 99; nonspecific ST changes with no evidence of acute ischemia; NSCSLT   Labs on Admission: I have personally reviewed the available labs and  imaging studies at the time of the admission.  Pertinent labs:   Glucose 112 Calcium 11.0 Albumin 3.4 HS troponin 4, 5 Normal CBC UA unremarkable COVID pending   Assessment/Plan Principal Problem:   Crohn's colitis, with intestinal obstruction (HCC) Active Problems:   Bipolar 1 disorder (HCC)   Crohn's with apparent stricture, SBO, ?fistulae -Most likely due to Crohn's flare, with months of abdominal pain and worsening in the last few days -Patient initially in agreement with admission and acknowledging the importance of remaining inpatient -Will admit to [place] for observation  -Will consult GI and Surgery to assist with management  -Morphine/Dilaudid for pain control, Zofran for nausea when necessary  -Empirically treat for Crohn's flare with intravenous steroids (solumedrol 60 mg q12h).  -Will keep NPO.  Mental illness -Patient with h/o leaving AMA -Will need capacity assessment if patient tries to leave.    Note: This patient has been tested and is negative for the novel coronavirus COVID-19.  DVT prophylaxis: SCDs Code Status:  Full - confirmed with patient Family Communication: None present Disposition Plan:  Home once clinically improved Consults called: GI, surgery  Admission status: Admit - It is my clinical opinion that admission to INPATIENT is reasonable and necessary because of the expectation that this patient will require hospital care that crosses at least 2 midnights to treat this condition based on the medical complexity of the problems presented.  Given the aforementioned information, the predictability of an adverse outcome is felt to be significant.    Karmen Bongo MD [facility]   How to contact the [facility] Attending or Consulting provider [place] or covering provider during after hours [place], for this patient?  1. Check the care team in [facility] and look for a) attending/consulting TR[facility] provider listed and b) the [facility] team listed 2. Log into  [email or web address] and use [place]'s universal password to access. If you do not have the password, please contact the hospital operator. 3. Locate the [facility] provider you are looking for under [facility] and page to a number that you can be directly reached. 4. If you still have difficulty reaching the provider, please page the [facility] (Director on Call) for the Hospitalists listed on amion for assistance.   10/11/2018, 4:51 PM

## 2018-10-11 NOTE — ED Notes (Signed)
ED Provider at bedside. 

## 2018-10-11 NOTE — Discharge Summary (Signed)
Discharge Summary  Jeffery Mcintosh ZRA:[id number] DOB: Nov 23, 1980  PCP: Patient, No Pcp Per  Admit date: 10/11/2018 Discharge date: 10/11/2018   Time spent: 35 minutes  Admitted From: Home Disposition:  LEFT AMA  Recommendations for Outpatient Follow-up:  1. Follow up with GI at an academic medical center 2. Arrange for PCP follow up    Discharge Diagnoses:  Active Hospital Problems   Diagnosis Date Noted   Crohn's colitis, with intestinal obstruction ([place]) 10/11/2018   Bipolar 1 disorder ([place]) 10/26/2017    Resolved Hospital Problems  No resolved problems to display.    Discharge Condition: Unstable - left AMA   Diet recommendation:  Liquid  Vitals:   10/11/18 1117 10/11/18 1333  BP: 124/86 118/70  Pulse: 96 85  Resp: 20 16  Temp:  99 F (37.2 C)  SpO2: 97% 98%    History of present illness/Hospital course:  Jeffery Mcintosh is a 38 y.o. year old male with medical history significant for schizophrenia; PTSD; bipolar; HTN; SBO; and Crohn's presenting with abdominal pain and was found to have a flare of Chron's colitis with concern for stricture causing incomplete SBO as well as possible fistuale.  Shortly after admission, he began requesting escalating doses of pain medication and reported that he didn't feel comfortable here.  He threatened to leave AMA and so STAT BH consult was requested with security called to ensure patient safety.  I was asked to come to see the patient and he was dressed and standing at the bedside.  I discussed his capacity and he appeared to understand the situation and the negative consequences that could occur from his leaving AMA. Regardless, he preferred to leave.  His mother showed up as he was signing the [place] form and also agreed with his decision to leave.   Consultations:  GI  Surgery  Procedures/Studies: CT  Discharge Exam: BP 118/70 (BP Location: Right Arm)    Pulse 85    Temp 99 F (37.2 C) (Oral)    Resp 16    Ht 5'  5" (1.651 m)    Wt 71.6 kg    SpO2 98%    BMI 26.27 kg/m   General: Standing at bedside, dressed and frustrated Eyes: EOMI, anicteric ENT: Oral Mucosa clear and moist Neurologic: Grossly no focal neuro deficit.Mental status AAOx3, speech normal, Psychiatric: Cantankerous affect, and mood   Discharge Instructions Follow up: Please make an appointment to see your primary physician for follow up within 7 days of hospital discharge.  At that appointment:  -We routinely change or add medications that can affect your baseline labs and fluid status; therefore, you may require repeat blood work or tests during your next visit with your PCP.  Your PCP may decide not to get them or may add new tests based on their clinical decision.  -Please get all medicines reviewed and adjusted.  -Please request that your primary physician go over all hospital tests and procedure/radiological results at the follow up.  Please get all hospital records sent to your physician by signing a hospital release before you go home.  Activity: As tolerated with fall precautions  Disposition: Home    Diet:   liquid until symptoms are better  For all patients - If you experience worsening of your admission symptoms or develop shortness of breath, life threatening emergency, suicidal or homicidal thoughts you must seek medical attention immediately by calling 911 or calling your MD immediately.  Read complete instructions along with all the possible side  effects for all the medicines you take and that have been prescribed to you. Take any new medicines after you have completely understood and accept all the possible adverse reactions/side effects.   Do not drive, operate heavy machinery, perform activities at heights, swimming or participation in water activities or provide baby sitting services if your were admitted for syncope/seizures until you have seen by Primary MD/Neurologist and advised to do so.  Do not drive when  taking pain medications.    Do not take more than prescribed pain, sleep and anxiety medications.  Special Instructions: If you have smoked or chewed Tobacco  in the last 2 yrs please stop smoking; also stop any regular Alcohol and/or any Recreational drug use including marijuana.  Wear Seat belts while driving.   Please note:  You were cared for by a hospitalist during your hospital stay. If you have any questions about your discharge medications or the care you received while you were in the hospital, you can call the unit and asked to speak with the hospitalist on call. Once you are discharged, your primary care physician will handle any further medical issues. Please note that NO REFILLS for any discharge medications will be authorized, as it is imperative that you return to your primary care physician (or establish a relationship with a primary care physician if you do not have one) for your aftercare needs so that they can reassess your need for medications and monitor your lab values.    No Known Allergies    The results of significant diagnostics from this hospitalization (including imaging, microbiology, ancillary and laboratory) are listed below for reference.    Significant Diagnostic Studies: Ct Abdomen Pelvis W Contrast  Result Date: 10/11/2018 CLINICAL DATA:  Acute generalized abdominal pain. Fever. Crohn's disease. EXAM: CT ABDOMEN AND PELVIS WITH CONTRAST TECHNIQUE: Multidetector CT imaging of the abdomen and pelvis was performed using the standard protocol following bolus administration of intravenous contrast. CONTRAST:  176m OMNIPAQUE IOHEXOL 300 MG/ML  SOLN COMPARISON:  07/23/2018 FINDINGS: Lower Chest: No acute findings. Hepatobiliary: No hepatic masses identified. Gallbladder is unremarkable. No evidence of biliary ductal dilatation. Pancreas:  No mass or inflammatory changes. Spleen: Within normal limits in size and appearance. Adrenals/Urinary Tract: No masses identified.  No evidence of hydronephrosis. Stomach/Bowel: Mild-to-moderate dilatation of multiple distal small bowel loops shows no significant change. Wall thickening and increased mucosal enhancement is again seen involving the terminal ileum, also without significant change. Transition point is again seen in the area of the terminal ileum, consistent with stricture causing a distal small bowel obstruction. In addition, there there is a matted appearance of the thickened distal small bowel loops in the right lower quadrant, with connecting linear opacities highly suspicious for entero-enteric fistulae. Vascular/Lymphatic: No pathologically enlarged lymph nodes. No abdominal aortic aneurysm. Reproductive:  No mass or other significant abnormality. Other:  None. Musculoskeletal:  No suspicious bone lesions identified. IMPRESSION: 1. No significant change in active Crohn's disease involving distal small bowel, with stricture causing distal small bowel obstruction. 2. "Matted" distal small bowel loops, with signs of entero-enteric fistulae. No evidence of abscess. Electronically Signed   By: JMarlaine HindM.D.   On: 10/11/2018 10:30    Microbiology: Recent Results (from the past 240 hour(s))  SARS Coronavirus 2 ([facility], [facility]) Nasopharyngeal Nasopharyngeal Swab     Status: None   Collection Time: 10/11/18 10:54 AM   Specimen: Nasopharyngeal Swab  Result Value Ref Range Status  SARS Coronavirus 2 NEGATIVE NEGATIVE Final    Comment: (NOTE) If result is NEGATIVE SARS-CoV-2 target nucleic acids are NOT DETECTED. The SARS-CoV-2 RNA is generally detectable in upper and lower  respiratory specimens during the acute phase of infection. The lowest  concentration of SARS-CoV-2 viral copies this assay can detect is 250  copies / mL. A negative result does not preclude SARS-CoV-2 infection  and should not be used as the sole basis for treatment or other  patient management decisions.   A negative result may occur with  improper specimen collection / handling, submission of specimen other  than nasopharyngeal swab, presence of viral mutation(s) within the  areas targeted by this assay, and inadequate number of viral copies  (<250 copies / mL). A negative result must be combined with clinical  observations, patient history, and epidemiological information. If result is POSITIVE SARS-CoV-2 target nucleic acids are DETECTED. The SARS-CoV-2 RNA is generally detectable in upper and lower  respiratory specimens dur ing the acute phase of infection.  Positive  results are indicative of active infection with SARS-CoV-2.  Clinical  correlation with patient history and other diagnostic information is  necessary to determine patient infection status.  Positive results do  not rule out bacterial infection or co-infection with other viruses. If result is PRESUMPTIVE POSTIVE SARS-CoV-2 nucleic acids MAY BE PRESENT.   A presumptive positive result was obtained on the submitted specimen  and confirmed on repeat testing.  While 2019 novel coronavirus  (SARS-CoV-2) nucleic acids may be present in the submitted sample  additional confirmatory testing may be necessary for epidemiological  and / or clinical management purposes  to differentiate between  SARS-CoV-2 and other Sarbecovirus currently known to infect humans.  If clinically indicated additional testing with an alternate test  methodology [phone]) is advised. The SARS-CoV-2 RNA is generally  detectable in upper and lower respiratory sp ecimens during the acute  phase of infection. The expected result is Negative. Fact Sheet for Patients:  StrictlyIdeas.no Fact Sheet for Healthcare Providers: BankingDealers.co.za This test is not yet approved or cleared by the [place] FDA and has been authorized for detection and/or diagnosis of SARS-CoV-2 by FDA under an Emergency Use  Authorization (EUA).  This EUA will remain in effect (meaning this test can be used) for the duration of the COVID-19 declaration under Section 564(b)(1) of the Act, 21 U.S.C. section 360bbb-3(b)(1), unless the authorization is terminated or revoked sooner. Performed at [facility], [place] [address], [place], [place] [address]      Labs: Basic Metabolic Panel: Recent Labs  Lab 10/11/18 0305  NA 140  K 4.3  CL 100  CO2 30  GLUCOSE 112*  BUN 6  CREATININE 0.98  CALCIUM 11.0*   Liver Function Tests: Recent Labs  Lab 10/11/18 0305  AST 17  ALT 16  ALKPHOS 108  BILITOT 1.0  PROT 6.8  ALBUMIN 3.4*   Recent Labs  Lab 10/11/18 0305  LIPASE 20   No results for input(s): AMMONIA in the last 168 hours. CBC: Recent Labs  Lab 10/11/18 0305  WBC 10.3  HGB 14.2  HCT 41.3  MCV 86.0  PLT 259   Cardiac Enzymes: No results for input(s): CKTOTAL, CKMB, CKMBINDEX, TROPONINI in the last 168 hours. BNP: BNP (last 3 results) No results for input(s): BNP in the last 8760 hours.  ProBNP (last 3 results) No results for input(s): PROBNP in the last 8760 hours.  CBG: No results for input(s): GLUCAP in the  last 168 hours.     Signed:  Karmen Bongo, MD [facility] 10/11/2018, 4:53 PM

## 2018-10-11 NOTE — ED Notes (Signed)
Pt is standing beside bed because this is the most comfortable position per patient.

## 2018-10-11 NOTE — ED Provider Notes (Signed)
[facility] EMERGENCY DEPARTMENT Provider Note   CSN: [id number] Arrival date & time: 10/11/18  0234     History   Chief Complaint Chief Complaint  Patient presents with  . Abdominal Pain  . Chest Pain    HPI Jeffery Mcintosh is a 38 y.o. male with history of Crohn's disease, GERD, hypertension, schizophrenic disorder, bipolar presents today for what he describes as a Crohn's flareup.  Patient reports that over the past 2 months he has had increasing issues with his Crohn's disease including a burning sensation in his abdomen, throat with nausea, vomiting and occasional diarrhea.  He reports that over the past 2 days his symptoms have worsened, he describes a burning sensation primarily in his abdomen constant worsened with eating and palpation and without alleviating factors, he reports pain will occasionally radiate up his throat as a burning sensation.  He reports multiple episodes of nonbloody/nonbilious emesis over the past 2 days.  He denies any diarrhea over the past 2 days or blood in the stool.  Of note patient reports that his Crohn's disease is managed by [facility] gastroenterology, notes unavailable through our EMR system today.  Patient denies taking any medications for his Crohn's disease.  Patient reports he has felt warm at home but denies measuring a fever, denies chills, headache/vision changes, neck pain/swelling, chest pain/shortness of breath, constipation, dysuria/hematuria, testicular pain/swelling, penile discharge, blood in stool, rash, pain to the extremities, injury/trauma or any additional concerns today.     HPI  Past Medical History:  Diagnosis Date  . Anxiety   . Arthritis   . Bipolar affective ([place])   . Chronic headaches   . Crohn's disease ([place]) history of  . Depression   . GERD (gastroesophageal reflux disease)   . Hypertension   . Noncompliance 04/17/2018  . PTSD (post-traumatic stress disorder) 04/17/2018  . Schizophrenic  disorder [facility])     Patient Active Problem List   Diagnosis Date Noted  . Crohn's disease of colon with intestinal obstruction ([place])   . Noncompliance 04/17/2018  . PTSD (post-traumatic stress disorder) 04/17/2018  . Crohn's disease of jejunum with intestinal obstruction ([place]) 10/26/2017  . SBO (small bowel obstruction) ([place]) 10/26/2017  . Bipolar 1 disorder ([place]) 10/26/2017  . Crohn's colitis, unspecified complication ([place]) 02/58/5277  . Epigastric burning sensation 01/03/2015  . Numbness and tingling in left arm 11/15/2014  . Exacerbation of Crohn's disease ([place]) 10/21/2014  . Crohn's disease ([place]) 10/21/2014  . Diarrhea 10/21/2014  . Abdominal pain   . Gout 04/18/2013  . Cubital tunnel syndrome 04/18/2013  . Abnormal urinalysis 04/18/2013    Past Surgical History:  Procedure Laterality Date  . NO PAST SURGERIES          Home Medications    Prior to Admission medications   Medication Sig Start Date End Date Taking? Authorizing Provider  amphetamine-dextroamphetamine (ADDERALL XR) 20 MG 24 hr capsule Take 20 mg by mouth every morning. 08/02/18  Yes [provider]  gabapentin (NEURONTIN) 300 MG capsule Take 300 mg by mouth 2 (two) times daily.   Yes [provider]  Multiple Vitamin (MULTIVITAMIN WITH MINERALS) TABS tablet Take 1 tablet by mouth daily.   Yes [provider]  omeprazole (PRILOSEC) 40 MG capsule Take 1 capsule (40 mg total) by mouth daily. Patient not taking: Reported on 01/18/2018 10/17/16 08/07/18  Nita Sells, MD  sucralfate (CARAFATE) 1 g tablet Take 1 tablet (1 g total) by mouth 4 (four) times daily -  with  meals and at bedtime. Patient not taking: Reported on 04/17/2018 01/18/18 08/07/18  Varney Biles, MD    Family History Family History  Problem Relation Age of Onset  . Hypertension Mother   . Heart attack Mother   . Stroke Mother   . Alcohol abuse Father   . Mental illness Father   . Multiple sclerosis Father    . Arthritis Maternal Grandmother   . Hypertension Maternal Grandmother   . Heart disease Maternal Grandmother   . Kidney disease Maternal Grandmother   . Diabetes Maternal Grandmother   . Cancer Maternal Grandmother        great, type unknown  . Hyperlipidemia Maternal Grandfather   . Colon polyps Maternal Grandfather   . Stroke Paternal Grandmother   . Ulcerative colitis Maternal Aunt     Social History Social History   Tobacco Use  . Smoking status: Current Every Day Smoker    Types: Cigars  . Smokeless tobacco: Never Used  Substance Use Topics  . Alcohol use: No    Alcohol/week: 0.0 standard drinks  . Drug use: No     Allergies   Patient has no known allergies.   Review of Systems Review of Systems Ten systems are reviewed and are negative for acute change except as noted in the HPI   Physical Exam Updated Vital Signs BP 119/77   Pulse 88   Temp 98.9 F (37.2 C) (Oral)   Resp 16   SpO2 98%   Physical Exam Constitutional:      General: He is not in acute distress.    Appearance: Normal appearance. He is well-developed. He is not ill-appearing or diaphoretic.  HENT:     Head: Normocephalic and atraumatic.     Right Ear: External ear normal.     Left Ear: External ear normal.     Nose: Nose normal.  Eyes:     General: Vision grossly intact. Gaze aligned appropriately.     Pupils: Pupils are equal, round, and reactive to light.  Neck:     Musculoskeletal: Normal range of motion.     Trachea: Trachea and phonation normal. No tracheal deviation.  Cardiovascular:     Rate and Rhythm: Normal rate and regular rhythm.     Pulses:          Dorsalis pedis pulses are 2+ on the right side and 2+ on the left side.  Pulmonary:     Effort: Pulmonary effort is normal. No respiratory distress.  Abdominal:     General: There is no distension.     Palpations: Abdomen is soft.     Tenderness: There is generalized abdominal tenderness. There is guarding. There is no  rebound. Negative signs include Murphy's sign and McBurney's sign.  Musculoskeletal: Normal range of motion.     Right lower leg: No edema.     Left lower leg: No edema.  Skin:    General: Skin is warm and dry.  Neurological:     Mental Status: He is alert.     GCS: GCS eye subscore is 4. GCS verbal subscore is 5. GCS motor subscore is 6.     Comments: Speech is clear and goal oriented, follows commands Major Cranial nerves without deficit, no facial droop Moves extremities without ataxia, coordination intact  Psychiatric:        Behavior: Behavior normal.    ED Treatments / Results  Labs (all labs ordered are listed, but only abnormal results are displayed) Labs Reviewed  COMPREHENSIVE  METABOLIC PANEL - Abnormal; Notable for the following components:      Result Value   Glucose, Bld 112 (*)    Calcium 11.0 (*)    Albumin 3.4 (*)    All other components within normal limits  URINALYSIS, ROUTINE W REFLEX MICROSCOPIC - Abnormal; Notable for the following components:   pH 9.0 (*)    All other components within normal limits  SARS CORONAVIRUS 2 (HOSPITAL ORDER, PERFORMED IN [facility] LAB)  LIPASE, BLOOD  CBC  TROPONIN I (HIGH SENSITIVITY)  TROPONIN I (HIGH SENSITIVITY)    EKG EKG Interpretation  Date/Time:  Monday October 11 2018 03:15:43 EDT Ventricular Rate:  99 PR Interval:  152 QRS Duration: 94 QT Interval:  328 QTC Calculation: 420 R Axis:   93 Text Interpretation:  Normal sinus rhythm Rightward axis T wave abnormality, consider inferolateral ischemia No significant change since last tracing Confirmed by Blanchie Dessert [phone]) on 10/11/2018 9:07:18 AM   Radiology Ct Abdomen Pelvis W Contrast  Result Date: 10/11/2018 CLINICAL DATA:  Acute generalized abdominal pain. Fever. Crohn's disease. EXAM: CT ABDOMEN AND PELVIS WITH CONTRAST TECHNIQUE: Multidetector CT imaging of the abdomen and pelvis was performed using the standard protocol following bolus  administration of intravenous contrast. CONTRAST:  179m OMNIPAQUE IOHEXOL 300 MG/ML  SOLN COMPARISON:  07/23/2018 FINDINGS: Lower Chest: No acute findings. Hepatobiliary: No hepatic masses identified. Gallbladder is unremarkable. No evidence of biliary ductal dilatation. Pancreas:  No mass or inflammatory changes. Spleen: Within normal limits in size and appearance. Adrenals/Urinary Tract: No masses identified. No evidence of hydronephrosis. Stomach/Bowel: Mild-to-moderate dilatation of multiple distal small bowel loops shows no significant change. Wall thickening and increased mucosal enhancement is again seen involving the terminal ileum, also without significant change. Transition point is again seen in the area of the terminal ileum, consistent with stricture causing a distal small bowel obstruction. In addition, there there is a matted appearance of the thickened distal small bowel loops in the right lower quadrant, with connecting linear opacities highly suspicious for entero-enteric fistulae. Vascular/Lymphatic: No pathologically enlarged lymph nodes. No abdominal aortic aneurysm. Reproductive:  No mass or other significant abnormality. Other:  None. Musculoskeletal:  No suspicious bone lesions identified. IMPRESSION: 1. No significant change in active Crohn's disease involving distal small bowel, with stricture causing distal small bowel obstruction. 2. "Matted" distal small bowel loops, with signs of entero-enteric fistulae. No evidence of abscess. Electronically Signed   By: JMarlaine HindM.D.   On: 10/11/2018 10:30    Procedures Procedures (including critical care time)  Medications Ordered in ED Medications  sodium chloride flush (NS) 0.9 % injection 3 mL (3 mLs Intravenous Not Given 10/11/18 0746)  ondansetron (ZOFRAN) injection 4 mg (4 mg Intravenous Given 10/11/18 0817)  sodium chloride 0.9 % bolus 1,000 mL (1,000 mLs Intravenous New Bag/Given 10/11/18 0816)  morphine 4 MG/ML injection 4 mg (4 mg  Intravenous Given 10/11/18 0817)  iohexol (OMNIPAQUE) 300 MG/ML solution 100 mL (100 mLs Intravenous Contrast Given 10/11/18 0921)  morphine 4 MG/ML injection 4 mg (4 mg Intravenous Given 10/11/18 1030)     Initial Impression / Assessment and Plan / ED Course  I have reviewed the triage vital signs and the nursing notes.  Pertinent labs & imaging results that were available during my care of the patient were reviewed by me and considered in my medical decision making (see chart for details).  Clinical Course as of Oct 11 1222  Mon Oct 11, 2018  1111 Dr.  Buccini to see patient.   [BM]  [facility]   [BM]  1140 Dr. Lorin Mercy   [BM]    Clinical Course User Index [BM] Nuala Alpha A, PA-C   EKG: Normal sinus rhythm Rightward axis T wave abnormality, consider inferolateral ischemia No significant change since last tracing Confirmed by Blanchie Dessert [phone]) on 10/11/2018 9:07:18 AM  Troponin ordered by triage staff: Initial HS Troponin: 4 Delta HS Troponin: 5  Do not suspect patient with cardiopulmonary etiology of his symptoms today high suspicion for GI etiology.  Will obtain CT scan today as patient with nausea, vomiting and abdominal tenderness in setting of his Crohn's disease.  Decreased bowel movements over the past few days does report last BM normal yesterday. - Urinalysis nonacute CBC within normal limits Lipase within normal limits CMP nonacute  CT AP:  IMPRESSION:  1. No significant change in active Crohn's disease involving distal  small bowel, with stricture causing distal small bowel obstruction.  2. "Matted" distal small bowel loops, with signs of entero-enteric  fistulae. No evidence of abscess.  - Patient is a full bolus today, Zofran and pain control with morphine.  He reports his symptoms have greatly improved on reevaluation.  He is resting comfortably and in no acute distress.  He reports last p.o. intake approximately 3 PM yesterday, he has had a small  amount of ice chips while here in the ED.  He states understanding of results today and care plan he is agreeable for admission and further work-up.  Screening COVID-19 test ordered.  Consults placed to gastroenterology as well as general surgery. - Discussed case with on-call gastroenterologist Dr. Cristina Gong, gastroenterology to see patient here for further evaluation.  Discussed case with on-call general surgery will be seeing patient for further evaluation, they have asked for medicine admission.  Consult placed to hospitalist service for admission. - On reassessment patient endorses return of pain, Dilaudid ordered. - Patient has been admitted to hospital service for further evaluation management.  Patient seen and evaluated by Dr. Maryan Rued during this visit.  Note: Portions of this report may have been transcribed using voice recognition software. Every effort was made to ensure accuracy; however, inadvertent computerized transcription errors may still be present. Final Clinical Impressions(s) / ED Diagnoses   Final diagnoses:  SBO (small bowel obstruction) [facility])    ED Discharge Orders    None       Gari Crown 10/11/18 1225    Blanchie Dessert, MD 10/11/18 2114

## 2018-10-11 NOTE — ED Triage Notes (Signed)
C/o burning to throat, chest, and abd x 2 days with nausea and vomiting.  Pt states he believes it is a Crohn's flare-up.

## 2018-10-11 NOTE — Consult Note (Signed)
[facility] Consult/Admission Note  Jeffery Mcintosh 1980-05-02  [id number].    Requesting Provider: Dr. Maryan Rued Chief Complaint/Reason for Consult: SBO 2/2 Crohn's  HPI:    Patient is a 38 year old male with a history of schizophrenic disorder, PTSD, HTN, bipolar affective, Crohn's who is followed by [facility] gastroenterology who presented the ED today with complaints of abdominal pain.  Patient states over the last 2 days he has had increased abdominal pain in his mid abdomen, crampy in nature and severe at times, nonradiating, and worse with food.  He ate a sausage sandwich 2 days ago and then began having a burning sensation in his upper chest.  This progressively worsened and abdominal pain also progressively worsened.  He reports 3 episodes of nonbloody, nonbilious emesis.  He had a normal bowel movement yesterday morning and has been passing gas.  He denies previous abdominal surgeries.  He denies diarrhea or blood in his stool.  He denies fever chills.  He is currently not nauseated.  Patient is not on blood thinners.  ROS:  Review of Systems  Constitutional: Negative for chills, diaphoresis and fever.  HENT: Negative for sore throat.   Respiratory: Negative for cough and shortness of breath.   Cardiovascular: Negative for chest pain.  Gastrointestinal: Positive for abdominal pain, nausea and vomiting. Negative for blood in stool, constipation and diarrhea.  Genitourinary: Negative for dysuria.  Skin: Negative for rash.  Neurological: Negative for dizziness and loss of consciousness.  All other systems reviewed and are negative.    Family History  Problem Relation Age of Onset  . Hypertension Mother   . Heart attack Mother   . Stroke Mother   . Alcohol abuse Father   . Mental illness Father   . Multiple sclerosis Father   . Arthritis Maternal Grandmother   . Hypertension Maternal Grandmother   . Heart disease Maternal Grandmother   . Kidney disease Maternal  Grandmother   . Diabetes Maternal Grandmother   . Cancer Maternal Grandmother        great, type unknown  . Hyperlipidemia Maternal Grandfather   . Colon polyps Maternal Grandfather   . Stroke Paternal Grandmother   . Ulcerative colitis Maternal Aunt     Past Medical History:  Diagnosis Date  . Anxiety   . Arthritis   . Bipolar affective ([place])   . Chronic headaches   . Crohn's disease ([place]) history of  . Depression   . GERD (gastroesophageal reflux disease)   . Hypertension   . Noncompliance 04/17/2018  . PTSD (post-traumatic stress disorder) 04/17/2018  . Schizophrenic disorder [facility])     Past Surgical History:  Procedure Laterality Date  . NO PAST SURGERIES      Social History:  reports that he has been smoking cigars. He has never used smokeless tobacco. He reports that he does not drink alcohol or use drugs.  Allergies: No Known Allergies  (Not in a hospital admission)   Blood pressure 124/86, pulse 96, temperature 98.9 F (37.2 C), temperature source Oral, resp. rate 20, SpO2 97 %.  Physical Exam Vitals signs and nursing note reviewed.  Constitutional:      General: He is not in acute distress.    Appearance: Normal appearance. He is well-developed. He is not ill-appearing, toxic-appearing or diaphoretic.  HENT:     Head: Normocephalic and atraumatic.     Nose: Nose normal.     Mouth/Throat:     Comments: Patient wearing mask Eyes:     General: No  scleral icterus.       Right eye: No discharge.        Left eye: No discharge.     Conjunctiva/sclera: Conjunctivae normal.     Pupils: Pupils are equal, round, and reactive to light.  Neck:     Musculoskeletal: Normal range of motion and neck supple.  Cardiovascular:     Rate and Rhythm: Normal rate and regular rhythm.     Pulses:          Radial pulses are 2+ on the right side and 2+ on the left side.       Dorsalis pedis pulses are 2+ on the right side and 2+ on the left side.     Heart sounds: Normal  heart sounds. No murmur.  Pulmonary:     Effort: Pulmonary effort is normal. No respiratory distress.     Breath sounds: Normal breath sounds. No wheezing, rhonchi or rales.  Abdominal:     General: Bowel sounds are normal. There is no distension.     Palpations: Abdomen is soft. Abdomen is not rigid.     Tenderness: There is abdominal tenderness in the periumbilical area. There is guarding.     Hernia: No hernia is present.  Musculoskeletal: Normal range of motion.        General: No tenderness or deformity.     Right lower leg: No edema.     Left lower leg: No edema.  Skin:    General: Skin is warm and dry.     Findings: No rash.  Neurological:     Mental Status: He is alert and oriented to person, place, and time.  Psychiatric:        Mood and Affect: Mood normal.        Behavior: Behavior normal.     Results for orders placed or performed during the hospital encounter of 10/11/18 (from the past 48 hour(s))  Lipase, blood     Status: None   Collection Time: 10/11/18  3:05 AM  Result Value Ref Range   Lipase 20 11 - 51 U/L    Comment: Performed at [facility], [place] [address] [place], [place] [address]  Comprehensive metabolic panel     Status: Abnormal   Collection Time: 10/11/18  3:05 AM  Result Value Ref Range   Sodium 140 135 - 145 mmol/L   Potassium 4.3 3.5 - 5.1 mmol/L   Chloride 100 98 - 111 mmol/L   CO2 30 22 - 32 mmol/L   Glucose, Bld 112 (H) 70 - 99 mg/dL   BUN 6 6 - 20 mg/dL   Creatinine, Ser 0.98 0.61 - 1.24 mg/dL   Calcium 11.0 (H) 8.9 - 10.3 mg/dL   Total Protein 6.8 6.5 - 8.1 g/dL   Albumin 3.4 (L) 3.5 - 5.0 g/dL   AST 17 15 - 41 U/L   ALT 16 0 - 44 U/L   Alkaline Phosphatase 108 38 - 126 U/L   Total Bilirubin 1.0 0.3 - 1.2 mg/dL   GFR calc non Af Amer >60 >60 mL/min   GFR calc Af Amer >60 >60 mL/min   Anion gap 10 5 - 15    Comment: Performed at [place] [address] [place] [address]  CBC     Status: None    Collection Time: 10/11/18  3:05 AM  Result Value Ref Range   WBC 10.3 4.0 - 10.5 K/uL   RBC 4.80 4.22 - 5.81  MIL/uL   Hemoglobin 14.2 13.0 - 17.0 g/dL   HCT 41.3 39.0 - 52.0 %   MCV 86.0 80.0 - 100.0 fL   MCH 29.6 26.0 - 34.0 pg   MCHC 34.4 30.0 - 36.0 g/dL   RDW 13.9 11.5 - 15.5 %   Platelets 259 150 - 400 K/uL   nRBC 0.0 0.0 - 0.2 %    Comment: Performed at [facility], [place] [address], [place], [place] [address]  Troponin I (High Sensitivity)     Status: None   Collection Time: 10/11/18  3:05 AM  Result Value Ref Range   Troponin I (High Sensitivity) 4 <18 ng/L    Comment: (NOTE) Elevated high sensitivity troponin I (hsTnI) values and significant  changes across serial measurements may suggest ACS but many other  chronic and acute conditions are known to elevate hsTnI results.  Refer to the "Links" section for chest pain algorithms and additional  guidance. Performed at [facility], [place] [address], [place], [place] [address]   Urinalysis, Routine w reflex microscopic     Status: Abnormal   Collection Time: 10/11/18  3:10 AM  Result Value Ref Range   Color, Urine YELLOW YELLOW   APPearance CLEAR CLEAR   Specific Gravity, Urine 1.013 1.005 - 1.030   pH 9.0 (H) 5.0 - 8.0   Glucose, UA NEGATIVE NEGATIVE mg/dL   Hgb urine dipstick NEGATIVE NEGATIVE   Bilirubin Urine NEGATIVE NEGATIVE   Ketones, ur NEGATIVE NEGATIVE mg/dL   Protein, ur NEGATIVE NEGATIVE mg/dL   Nitrite NEGATIVE NEGATIVE   Leukocytes,Ua NEGATIVE NEGATIVE    Comment: Performed at [place] [address], [place], [place] [address]  Troponin I (High Sensitivity)     Status: None   Collection Time: 10/11/18  5:13 AM  Result Value Ref Range   Troponin I (High Sensitivity) 5 <18 ng/L    Comment: (NOTE) Elevated high sensitivity troponin I (hsTnI) values and significant  changes across serial measurements may suggest ACS but many other  chronic and acute conditions are known to elevate  hsTnI results.  Refer to the "Links" section for chest pain algorithms and additional  guidance. Performed at [facility], [place] [address] [place], [place] [address]    Ct Abdomen Pelvis W Contrast  Result Date: 10/11/2018 CLINICAL DATA:  Acute generalized abdominal pain. Fever. Crohn's disease. EXAM: CT ABDOMEN AND PELVIS WITH CONTRAST TECHNIQUE: Multidetector CT imaging of the abdomen and pelvis was performed using the standard protocol following bolus administration of intravenous contrast. CONTRAST:  171m OMNIPAQUE IOHEXOL 300 MG/ML  SOLN COMPARISON:  07/23/2018 FINDINGS: Lower Chest: No acute findings. Hepatobiliary: No hepatic masses identified. Gallbladder is unremarkable. No evidence of biliary ductal dilatation. Pancreas:  No mass or inflammatory changes. Spleen: Within normal limits in size and appearance. Adrenals/Urinary Tract: No masses identified. No evidence of hydronephrosis. Stomach/Bowel: Mild-to-moderate dilatation of multiple distal small bowel loops shows no significant change. Wall thickening and increased mucosal enhancement is again seen involving the terminal ileum, also without significant change. Transition point is again seen in the area of the terminal ileum, consistent with stricture causing a distal small bowel obstruction. In addition, there there is a matted appearance of the thickened distal small bowel loops in the right lower quadrant, with connecting linear opacities highly suspicious for entero-enteric fistulae. Vascular/Lymphatic: No pathologically enlarged lymph nodes. No abdominal aortic aneurysm. Reproductive:  No mass or other significant abnormality. Other:  None. Musculoskeletal:  No  suspicious bone lesions identified. IMPRESSION: 1. No significant change in active Crohn's disease involving distal small bowel, with stricture causing distal small bowel obstruction. 2. "Matted" distal small bowel loops, with signs of entero-enteric fistulae. No evidence of  abscess. Electronically Signed   By: Marlaine Hind M.D.   On: 10/11/2018 10:30      Assessment/Plan Active Problems:   * No active hospital problems. *  Crohn's - CT showed no significant change in active Crohn's disease with stricture causing distal SBO, signs of enteroenteric fistula, no evidence of abscess - Patient is currently not obstructed this time as he is passing gas and not vomiting - Recommend medical mission with bowel rest and GI consult - Okay to have clear liquids from our standpoint - Nothing surgical to do at this time, we will follow   Kalman Drape, [facility] 10/11/2018, 11:41 AM Pager: [phone] Consults: [phone] Mon-Fri 7:00 am-4:30 pm Sat-Sun 7:00 am-11:30 am

## 2018-10-11 NOTE — Progress Notes (Signed)
Called to the room, patient was observed standing trying to pull his IVF, and said he is going home, he said he is not comfortable here. PRN medication was given at 1518Hr.Dr. Lorin Mercy was made aware. Will refer patient to [facility]. Security was called to the room. Patient was noted walking out of the floor. CN was made aware and followed patient to the first floor, security came and escorted patient back to the room . Dr. Lorin Mercy was asked to see patient. Dr. Lorin Mercy in and seen patient. Patient may leave after signing LAMA form. Patient signed form and left with his mother.

## 2018-10-11 NOTE — ED Notes (Signed)
Pt transported to CT ?

## 2018-10-11 NOTE — ED Notes (Signed)
Pt escorted from lobby to treatment room, pt changed into gown and placed on pulse ox and BP monitoring. Pt resting comfortably in bed at this time waiting for EDP. Pt still has c/o of epigastric burning. Lights dimmed pt watching t.v

## 2018-10-11 NOTE — ED Notes (Signed)
ED TO INPATIENT HANDOFF REPORT  ED Nurse Name and Phone #: Harriette Bouillon [phone]  S Name/Age/Gender Jeffery Mcintosh 38 y.o. male Room/Bed: 041C/041C  Code Status   Code Status: Prior  Home/SNF/Other Home Patient oriented to: self, place, time and situation Is this baseline? Yes   Triage Complete: Triage complete  Chief Complaint Chrons flare up  Triage Note C/o burning to throat, chest, and abd x 2 days with nausea and vomiting.  Pt states he believes it is a Crohn's flare-up.   Allergies No Known Allergies  Level of Care/Admitting Diagnosis ED Disposition    ED Disposition Condition [facility]: [place] [[id number]]  Level of Care: Med-Surg [16]  Covid Evaluation: Asymptomatic Screening Protocol (No Symptoms)  Diagnosis: Crohn's colitis, with intestinal obstruction [facility]) [[id number]]  Admitting Physician: Karmen Bongo [[id number]]  Attending Physician: Karmen Bongo [[id number]]  Estimated length of stay: 3 - 4 days  Certification:: I certify this patient will need inpatient services for at least 2 midnights  PT Class (Do Not Modify): Inpatient [101]  PT Acc Code (Do Not Modify): Private [1]       B Medical/Surgery History Past Medical History:  Diagnosis Date  . Anxiety   . Arthritis   . Bipolar affective ([place])   . Chronic headaches   . Crohn's disease ([place]) history of  . Depression   . GERD (gastroesophageal reflux disease)   . Hypertension   . Noncompliance 04/17/2018  . PTSD (post-traumatic stress disorder) 04/17/2018  . Schizophrenic disorder [facility])    Past Surgical History:  Procedure Laterality Date  . NO PAST SURGERIES       A IV Location/Drains/Wounds Patient Lines/Drains/Airways Status   Active Line/Drains/Airways    Name:   Placement date:   Placement time:   Site:   Days:   Peripheral IV 10/11/18 Left Hand   10/11/18    0815    Hand   less than 1          Intake/Output Last 24 hours  Intake/Output  Summary (Last 24 hours) at 10/11/2018 1240 Last data filed at 10/11/2018 1120 Gross per 24 hour  Intake 2614.05 ml  Output 850 ml  Net 1764.05 ml    Labs/Imaging Results for orders placed or performed during the hospital encounter of 10/11/18 (from the past 48 hour(s))  Lipase, blood     Status: None   Collection Time: 10/11/18  3:05 AM  Result Value Ref Range   Lipase 20 11 - 51 U/L    Comment: Performed at [facility], [address] [place], [place] [address]  Comprehensive metabolic panel     Status: Abnormal   Collection Time: 10/11/18  3:05 AM  Result Value Ref Range   Sodium 140 135 - 145 mmol/L   Potassium 4.3 3.5 - 5.1 mmol/L   Chloride 100 98 - 111 mmol/L   CO2 30 22 - 32 mmol/L   Glucose, Bld 112 (H) 70 - 99 mg/dL   BUN 6 6 - 20 mg/dL   Creatinine, Ser 0.98 0.61 - 1.24 mg/dL   Calcium 11.0 (H) 8.9 - 10.3 mg/dL   Total Protein 6.8 6.5 - 8.1 g/dL   Albumin 3.4 (L) 3.5 - 5.0 g/dL   AST 17 15 - 41 U/L   ALT 16 0 - 44 U/L   Alkaline Phosphatase 108 38 - 126 U/L   Total Bilirubin 1.0 0.3 - 1.2 mg/dL   GFR calc non Af  Amer >60 >60 mL/min   GFR calc Af Amer >60 >60 mL/min   Anion gap 10 5 - 15    Comment: Performed at [facility] [address], [place], [place] [address]  CBC     Status: None   Collection Time: 10/11/18  3:05 AM  Result Value Ref Range   WBC 10.3 4.0 - 10.5 K/uL   RBC 4.80 4.22 - 5.81 MIL/uL   Hemoglobin 14.2 13.0 - 17.0 g/dL   HCT 41.3 39.0 - 52.0 %   MCV 86.0 80.0 - 100.0 fL   MCH 29.6 26.0 - 34.0 pg   MCHC 34.4 30.0 - 36.0 g/dL   RDW 13.9 11.5 - 15.5 %   Platelets 259 150 - 400 K/uL   nRBC 0.0 0.0 - 0.2 %    Comment: Performed at [facility], [place] [address] [place], [place] [address]  Troponin I (High Sensitivity)     Status: None   Collection Time: 10/11/18  3:05 AM  Result Value Ref Range   Troponin I (High Sensitivity) 4 <18 ng/L    Comment: (NOTE) Elevated high sensitivity troponin I (hsTnI) values and significant   changes across serial measurements may suggest ACS but many other  chronic and acute conditions are known to elevate hsTnI results.  Refer to the "Links" section for chest pain algorithms and additional  guidance. Performed at [facility], [place] [address], [place], [place] [address]   Urinalysis, Routine w reflex microscopic     Status: Abnormal   Collection Time: 10/11/18  3:10 AM  Result Value Ref Range   Color, Urine YELLOW YELLOW   APPearance CLEAR CLEAR   Specific Gravity, Urine 1.013 1.005 - 1.030   pH 9.0 (H) 5.0 - 8.0   Glucose, UA NEGATIVE NEGATIVE mg/dL   Hgb urine dipstick NEGATIVE NEGATIVE   Bilirubin Urine NEGATIVE NEGATIVE   Ketones, ur NEGATIVE NEGATIVE mg/dL   Protein, ur NEGATIVE NEGATIVE mg/dL   Nitrite NEGATIVE NEGATIVE   Leukocytes,Ua NEGATIVE NEGATIVE    Comment: Performed at [facility] [address], [place], [place] [address]  Troponin I (High Sensitivity)     Status: None   Collection Time: 10/11/18  5:13 AM  Result Value Ref Range   Troponin I (High Sensitivity) 5 <18 ng/L    Comment: (NOTE) Elevated high sensitivity troponin I (hsTnI) values and significant  changes across serial measurements may suggest ACS but many other  chronic and acute conditions are known to elevate hsTnI results.  Refer to the "Links" section for chest pain algorithms and additional  guidance. Performed at [facility], [place] [address], [place], [place] [address]   SARS Coronavirus 2 [facility] order, Performed in [facility] hospital lab) Nasopharyngeal Nasopharyngeal Swab     Status: None   Collection Time: 10/11/18 10:54 AM   Specimen: Nasopharyngeal Swab  Result Value Ref Range   SARS Coronavirus 2 NEGATIVE NEGATIVE    Comment: (NOTE) If result is NEGATIVE SARS-CoV-2 target nucleic acids are NOT DETECTED. The SARS-CoV-2 RNA is generally detectable in upper and lower  respiratory specimens during the acute phase of infection. The lowest   concentration of SARS-CoV-2 viral copies this assay can detect is 250  copies / mL. A negative result does not preclude SARS-CoV-2 infection  and should not be used as the sole basis for treatment or other  patient management decisions.  A negative result may occur with  improper specimen  collection / handling, submission of specimen other  than nasopharyngeal swab, presence of viral mutation(s) within the  areas targeted by this assay, and inadequate number of viral copies  (<250 copies / mL). A negative result must be combined with clinical  observations, patient history, and epidemiological information. If result is POSITIVE SARS-CoV-2 target nucleic acids are DETECTED. The SARS-CoV-2 RNA is generally detectable in upper and lower  respiratory specimens dur ing the acute phase of infection.  Positive  results are indicative of active infection with SARS-CoV-2.  Clinical  correlation with patient history and other diagnostic information is  necessary to determine patient infection status.  Positive results do  not rule out bacterial infection or co-infection with other viruses. If result is PRESUMPTIVE POSTIVE SARS-CoV-2 nucleic acids MAY BE PRESENT.   A presumptive positive result was obtained on the submitted specimen  and confirmed on repeat testing.  While 2019 novel coronavirus  (SARS-CoV-2) nucleic acids may be present in the submitted sample  additional confirmatory testing may be necessary for epidemiological  and / or clinical management purposes  to differentiate between  SARS-CoV-2 and other Sarbecovirus currently known to infect humans.  If clinically indicated additional testing with an alternate test  methodology [phone]) is advised. The SARS-CoV-2 RNA is generally  detectable in upper and lower respiratory sp ecimens during the acute  phase of infection. The expected result is Negative. Fact Sheet for Patients:  StrictlyIdeas.no Fact Sheet  for Healthcare Providers: BankingDealers.co.za This test is not yet approved or cleared by the [place] FDA and has been authorized for detection and/or diagnosis of SARS-CoV-2 by FDA under an Emergency Use Authorization (EUA).  This EUA will remain in effect (meaning this test can be used) for the duration of the COVID-19 declaration under Section 564(b)(1) of the Act, 21 U.S.C. section 360bbb-3(b)(1), unless the authorization is terminated or revoked sooner. Performed at [facility], [place] [address] [place], [place] [address]    Ct Abdomen Pelvis W Contrast  Result Date: 10/11/2018 CLINICAL DATA:  Acute generalized abdominal pain. Fever. Crohn's disease. EXAM: CT ABDOMEN AND PELVIS WITH CONTRAST TECHNIQUE: Multidetector CT imaging of the abdomen and pelvis was performed using the standard protocol following bolus administration of intravenous contrast. CONTRAST:  116m OMNIPAQUE IOHEXOL 300 MG/ML  SOLN COMPARISON:  07/23/2018 FINDINGS: Lower Chest: No acute findings. Hepatobiliary: No hepatic masses identified. Gallbladder is unremarkable. No evidence of biliary ductal dilatation. Pancreas:  No mass or inflammatory changes. Spleen: Within normal limits in size and appearance. Adrenals/Urinary Tract: No masses identified. No evidence of hydronephrosis. Stomach/Bowel: Mild-to-moderate dilatation of multiple distal small bowel loops shows no significant change. Wall thickening and increased mucosal enhancement is again seen involving the terminal ileum, also without significant change. Transition point is again seen in the area of the terminal ileum, consistent with stricture causing a distal small bowel obstruction. In addition, there there is a matted appearance of the thickened distal small bowel loops in the right lower quadrant, with connecting linear opacities highly suspicious for entero-enteric fistulae. Vascular/Lymphatic: No pathologically enlarged lymph nodes.  No abdominal aortic aneurysm. Reproductive:  No mass or other significant abnormality. Other:  None. Musculoskeletal:  No suspicious bone lesions identified. IMPRESSION: 1. No significant change in active Crohn's disease involving distal small bowel, with stricture causing distal small bowel obstruction. 2. "Matted" distal small bowel loops, with signs of entero-enteric fistulae. No evidence of abscess. Electronically Signed   By: JMarlaine HindM.D.   On: 10/11/2018 10:30  Pending Labs Unresulted Labs (From admission, onward)    Start     Ordered   10/11/18 1143  Urine rapid drug screen (hosp performed)  Add-on,   AD     10/11/18 1142          Vitals/Pain Today's Vitals   10/11/18 0921 10/11/18 1101 10/11/18 1117 10/11/18 1239  BP:   124/86   Pulse:   96   Resp:   20   Temp:      TempSrc:      SpO2:   97%   PainSc: 9  7  9  7      Isolation Precautions No active isolations  Medications Medications  sodium chloride flush (NS) 0.9 % injection 3 mL (3 mLs Intravenous Not Given 10/11/18 0746)  ondansetron (ZOFRAN) injection 4 mg (4 mg Intravenous Given 10/11/18 0817)  sodium chloride 0.9 % bolus 1,000 mL (0 mLs Intravenous Stopped 10/11/18 1053)  morphine 4 MG/ML injection 4 mg (4 mg Intravenous Given 10/11/18 0817)  iohexol (OMNIPAQUE) 300 MG/ML solution 100 mL (100 mLs Intravenous Contrast Given 10/11/18 0921)  morphine 4 MG/ML injection 4 mg (4 mg Intravenous Given 10/11/18 1030)  HYDROmorphone (DILAUDID) injection 0.5 mg (0.5 mg Intravenous Given 10/11/18 1128)    Mobility walks Low fall risk   Focused Assessments Cardiac Assessment Handoff:    No results found for: CKTOTAL, CKMB, CKMBINDEX, TROPONINI No results found for: DDIMER Does the Patient currently have chest pain? No      R Recommendations: See Admitting Provider Note  Report given to:   Additional Notes:

## 2018-11-29 ENCOUNTER — Emergency Department (HOSPITAL_COMMUNITY)
Admission: EM | Admit: 2018-11-29 | Discharge: 2018-11-29 | Disposition: A | Discharge status: Home / Self Care | Payer: Medicare Other | Attending: Emergency Medicine | Admitting: Emergency Medicine

## 2018-11-29 ENCOUNTER — Other Ambulatory Visit: Payer: Self-pay

## 2018-11-29 ENCOUNTER — Encounter (HOSPITAL_COMMUNITY): Payer: Self-pay | Admitting: Emergency Medicine

## 2018-11-29 ENCOUNTER — Emergency Department (HOSPITAL_COMMUNITY): Payer: Medicare Other

## 2018-11-29 DIAGNOSIS — R1084 Generalized abdominal pain: Secondary | ICD-10-CM

## 2018-11-29 DIAGNOSIS — R1033 Periumbilical pain: Secondary | ICD-10-CM | POA: Diagnosis not present

## 2018-11-29 DIAGNOSIS — Z87891 Personal history of nicotine dependence: Secondary | ICD-10-CM | POA: Insufficient documentation

## 2018-11-29 DIAGNOSIS — I10 Essential (primary) hypertension: Secondary | ICD-10-CM | POA: Insufficient documentation

## 2018-11-29 DIAGNOSIS — R1013 Epigastric pain: Secondary | ICD-10-CM | POA: Diagnosis present

## 2018-11-29 LAB — COMPREHENSIVE METABOLIC PANEL
ALT: 14 U/L (ref 0–44)
AST: 15 U/L (ref 15–41)
Albumin: 3.7 g/dL (ref 3.5–5.0)
Alkaline Phosphatase: 103 U/L (ref 38–126)
Anion gap: 11 (ref 5–15)
BUN: 9 mg/dL (ref 6–20)
CO2: 25 mmol/L (ref 22–32)
Calcium: 9.3 mg/dL (ref 8.9–10.3)
Chloride: 103 mmol/L (ref 98–111)
Creatinine, Ser: 0.84 mg/dL (ref 0.61–1.24)
GFR calc Af Amer: >60 mL/min (ref >60)
GFR calc non Af Amer: >60 mL/min (ref >60)
Glucose, Bld: 131 mg/dL — ABNORMAL HIGH (ref 70–99)
Potassium: 4.2 mmol/L (ref 3.5–5.1)
Sodium: 139 mmol/L (ref 135–145)
Total Bilirubin: 0.5 mg/dL (ref 0.3–1.2)
Total Protein: 7.3 g/dL (ref 6.5–8.1)

## 2018-11-29 LAB — URINALYSIS, ROUTINE W REFLEX MICROSCOPIC
Bilirubin Urine: NEGATIVE
Glucose, UA: NEGATIVE mg/dL
Hgb urine dipstick: NEGATIVE
Ketones, ur: NEGATIVE mg/dL
Leukocytes,Ua: NEGATIVE
Nitrite: NEGATIVE
Protein, ur: NEGATIVE mg/dL
Specific Gravity, Urine: 1.014 (ref 1.005–1.030)
pH: 6 (ref 5.0–8.0)

## 2018-11-29 LAB — CBC
HCT: 42.3 % (ref 39.0–52.0)
Hemoglobin: 14.6 g/dL (ref 13.0–17.0)
MCH: 30.3 pg (ref 26.0–34.0)
MCHC: 34.5 g/dL (ref 30.0–36.0)
MCV: 87.8 fL (ref 80.0–100.0)
Platelets: 210 10*3/uL (ref 150–400)
RBC: 4.82 MIL/uL (ref 4.22–5.81)
RDW: 14.2 % (ref 11.5–15.5)
WBC: 7.5 10*3/uL (ref 4.0–10.5)
nRBC: 0 % (ref 0.0–0.2)

## 2018-11-29 LAB — LIPASE, BLOOD: Lipase: 58 U/L — ABNORMAL HIGH (ref 11–51)

## 2018-11-29 MED ORDER — SODIUM CHLORIDE 0.9 % IV BOLUS
1000.0000 mL | Freq: Once | INTRAVENOUS | Status: AC
  Administered 2018-11-29: 07:00:00 1000 mL via INTRAVENOUS

## 2018-11-29 MED ORDER — SODIUM CHLORIDE (PF) 0.9 % IJ SOLN
INTRAMUSCULAR | Status: AC
  Filled 2018-11-29: qty 50

## 2018-11-29 MED ORDER — IOHEXOL 300 MG/ML  SOLN
100.0000 mL | Freq: Once | INTRAMUSCULAR | Status: AC | PRN
  Administered 2018-11-29: 10:00:00 100 mL via INTRAVENOUS

## 2018-11-29 MED ORDER — SODIUM CHLORIDE 0.9% FLUSH
3.0000 mL | Freq: Once | INTRAVENOUS | Status: AC
  Administered 2018-11-29: 3 mL via INTRAVENOUS

## 2018-11-29 MED ORDER — MORPHINE SULFATE (PF) 4 MG/ML IV SOLN
4.0000 mg | Freq: Once | INTRAVENOUS | Status: AC
  Administered 2018-11-29: 07:00:00 4 mg via INTRAVENOUS
  Filled 2018-11-29: qty 1

## 2018-11-29 MED ORDER — MORPHINE SULFATE (PF) 4 MG/ML IV SOLN
6.0000 mg | Freq: Once | INTRAVENOUS | Status: AC
  Administered 2018-11-29: 6 mg via INTRAVENOUS
  Filled 2018-11-29: qty 2

## 2018-11-29 MED ORDER — ONDANSETRON HCL 4 MG/2ML IJ SOLN
4.0000 mg | Freq: Once | INTRAMUSCULAR | Status: AC
  Administered 2018-11-29: 07:00:00 4 mg via INTRAVENOUS
  Filled 2018-11-29: qty 2

## 2018-11-29 NOTE — Discharge Instructions (Addendum)
Your CT report is attached to your chart.  You will need to ultimately follow-up with gastroenterology, phone numbers are attached to your chart.  Please establish gastroenterology follow-up as he will need this to further treat your Crohn's.

## 2018-11-29 NOTE — ED Triage Notes (Signed)
Patient is complaining of abdominal that started 2 months ago. Patient is complaining of nausea and vomiting.

## 2018-11-29 NOTE — ED Notes (Signed)
Patient given crackers and gingerale.

## 2018-11-29 NOTE — ED Provider Notes (Signed)
[place] DEPT Provider Note   CSN: [id number] Arrival date & time: 11/29/18  0555     History   Chief Complaint Chief Complaint  Patient presents with  . Abdominal Pain    HPI Jeffery Mcintosh is a 38 y.o. male.     38 y.o male with a PMH of PTSD, Chron's colitis with intestinal obstruction (10/11/18) presents to the ED with a chief complaint of abdominal pain x 1 month. Patient was recently hospitalized for his intestinal obstruction but left AMA. He endorses constant sharp pain along the epigastric region with radiation to the umbilicus. The pain is worse with eating, and alleviated with defecation. He also endorses emesis, with three non bloody, non bilious episodes tonight. He also endorses diarrhea along with anorexia, last ate a biscuit this evening. He reports being followed by GI for his chron's in the past but stopped following up as he states "they weren't doing anything". He denies any fever, urinary symptoms, back pain or prior surgical history to his abdomen.   The history is provided by the patient and medical records.  Abdominal Pain Associated symptoms: diarrhea, nausea and vomiting   Associated symptoms: no chest pain, no constipation, no fever, no shortness of breath and no sore throat     Past Medical History:  Diagnosis Date  . Anxiety   . Arthritis   . Bipolar affective ([place])   . Chronic headaches   . Crohn's disease ([place]) history of  . Depression   . GERD (gastroesophageal reflux disease)   . Hypertension   . Noncompliance 04/17/2018  . PTSD (post-traumatic stress disorder) 04/17/2018  . Schizophrenic disorder [facility])     Patient Active Problem List   Diagnosis Date Noted  . Crohn's colitis, with intestinal obstruction ([place]) 10/11/2018  . Crohn's disease of colon with intestinal obstruction ([place])   . Noncompliance 04/17/2018  . PTSD (post-traumatic stress disorder) 04/17/2018  . Crohn's disease of jejunum with  intestinal obstruction ([place]) 10/26/2017  . SBO (small bowel obstruction) ([place]) 10/26/2017  . Bipolar 1 disorder ([place]) 10/26/2017  . Crohn's colitis, unspecified complication ([place]) 40/98/1191  . Epigastric burning sensation 01/03/2015  . Numbness and tingling in left arm 11/15/2014  . Exacerbation of Crohn's disease ([place]) 10/21/2014  . Crohn's disease ([place]) 10/21/2014  . Diarrhea 10/21/2014  . Abdominal pain   . Gout 04/18/2013  . Cubital tunnel syndrome 04/18/2013  . Abnormal urinalysis 04/18/2013    Past Surgical History:  Procedure Laterality Date  . NO PAST SURGERIES          Home Medications    Prior to Admission medications   Medication Sig Start Date End Date Taking? Authorizing Provider  amphetamine-dextroamphetamine (ADDERALL XR) 20 MG 24 hr capsule Take 20 mg by mouth every morning. 08/02/18   [provider]  gabapentin (NEURONTIN) 300 MG capsule Take 300 mg by mouth 2 (two) times daily.    [provider]  Multiple Vitamin (MULTIVITAMIN WITH MINERALS) TABS tablet Take 1 tablet by mouth daily.    [provider]  omeprazole (PRILOSEC) 40 MG capsule Take 1 capsule (40 mg total) by mouth daily. Patient not taking: Reported on 01/18/2018 10/17/16 08/07/18  Nita Sells, MD  sucralfate (CARAFATE) 1 g tablet Take 1 tablet (1 g total) by mouth 4 (four) times daily -  with meals and at bedtime. Patient not taking: Reported on 04/17/2018 01/18/18 08/07/18  Varney Biles, MD    Family History Family History  Problem Relation Age of  Onset  . Hypertension Mother   . Heart attack Mother   . Stroke Mother   . Alcohol abuse Father   . Mental illness Father   . Multiple sclerosis Father   . Arthritis Maternal Grandmother   . Hypertension Maternal Grandmother   . Heart disease Maternal Grandmother   . Kidney disease Maternal Grandmother   . Diabetes Maternal Grandmother   . Cancer Maternal Grandmother        great, type unknown  .  Hyperlipidemia Maternal Grandfather   . Colon polyps Maternal Grandfather   . Stroke Paternal Grandmother   . Ulcerative colitis Maternal Aunt     Social History Social History   Tobacco Use  . Smoking status: Former Smoker    Packs/day: 0.50    Years: 15.00    Pack years: 7.50    Types: Cigars  . Smokeless tobacco: Never Used  Substance Use Topics  . Alcohol use: No    Alcohol/week: 0.0 standard drinks  . Drug use: No     Allergies   Patient has no known allergies.   Review of Systems Review of Systems  Constitutional: Negative for fever.  HENT: Negative for sore throat.   Respiratory: Negative for shortness of breath.   Cardiovascular: Negative for chest pain.  Gastrointestinal: Positive for abdominal pain, diarrhea, nausea and vomiting. Negative for blood in stool, constipation and rectal pain.  Genitourinary: Negative for difficulty urinating and flank pain.  Musculoskeletal: Negative for back pain and myalgias.  Skin: Negative for pallor and wound.  Neurological: Negative for headaches.     Physical Exam Updated Vital Signs BP 138/76   Pulse 76   Temp 98.1 F (36.7 C) (Oral)   Resp 18   Ht 5' 5"  (1.651 m)   Wt 67.4 kg   SpO2 99%   BMI 24.73 kg/m   Physical Exam Vitals signs and nursing note reviewed.  Constitutional:      Appearance: He is well-developed. He is not ill-appearing.  HENT:     Head: Normocephalic and atraumatic.     Mouth/Throat:     Mouth: Mucous membranes are moist.  Eyes:     General: No scleral icterus.    Pupils: Pupils are equal, round, and reactive to light.  Neck:     Musculoskeletal: Normal range of motion.  Cardiovascular:     Heart sounds: Normal heart sounds.  Pulmonary:     Effort: Pulmonary effort is normal.     Breath sounds: Normal breath sounds. No wheezing.  Chest:     Chest wall: No tenderness.  Abdominal:     General: Bowel sounds are increased. There is no distension.     Palpations: Abdomen is soft.      Tenderness: There is abdominal tenderness in the epigastric area and periumbilical area. There is guarding. There is no right CVA tenderness or left CVA tenderness. Negative signs include Murphy's sign and McBurney's sign.     Hernia: No hernia is present.  Musculoskeletal:        General: No tenderness or deformity.  Skin:    General: Skin is warm and dry.  Neurological:     Mental Status: He is alert and oriented to person, place, and time.      ED Treatments / Results  Labs (all labs ordered are listed, but only abnormal results are displayed) Labs Reviewed  LIPASE, BLOOD - Abnormal; Notable for the following components:      Result Value   Lipase 58 (*)  All other components within normal limits  COMPREHENSIVE METABOLIC PANEL - Abnormal; Notable for the following components:   Glucose, Bld 131 (*)    All other components within normal limits  CBC  URINALYSIS, ROUTINE W REFLEX MICROSCOPIC    EKG None  Radiology Ct Abdomen Pelvis W Contrast  Result Date: 11/29/2018 CLINICAL DATA:  Abdominal pain with nausea and vomiting. History of Crohn's disease EXAM: CT ABDOMEN AND PELVIS WITH CONTRAST TECHNIQUE: Multidetector CT imaging of the abdomen and pelvis was performed using the standard protocol following bolus administration of intravenous contrast. CONTRAST:  190m OMNIPAQUE IOHEXOL 300 MG/ML  SOLN COMPARISON:  October 11, 2018 FINDINGS: Lower chest: Lung bases are clear. Hepatobiliary: No focal liver lesions are evident. Gallbladder wall is not appreciably thickened. There is no biliary duct dilatation. Pancreas: There is no pancreatic mass or inflammatory focus. Spleen: No splenic lesions are evident. Adrenals/Urinary Tract: Adrenals bilaterally appear normal. Kidneys bilaterally show no evident mass or hydronephrosis on either side. There is no evident renal or ureteral calculus on either side. Urinary bladder is midline with wall thickness within normal limits.  Stomach/Bowel: There remains thickening in mesenteric stranding involving the terminal ileum consistent with known Crohn's disease. There remains a focal area of narrowing involving the terminal ileum consistent with stricture in this area, likely secondary to chronic inflammation. There is a relative transition zone at this site, although the remainder of the small bowel does not appear significantly distended. The matted appearance in this area does suggest enteroenteric fistulae without progression compared to the previous study. No abscess evident in the terminal ileal region. Elsewhere, there is no appreciable bowel wall or mesenteric thickening. Frank bowel obstruction is not felt to be present. No free air or portal venous air evident. Vascular/Lymphatic: There is no abdominal aortic aneurysm. No vascular lesions are demonstrable on this study. There is no adenopathy in the abdomen or pelvis. Reproductive: Prostate and seminal vesicles appear normal in size and contour. No evident pelvic mass. Other: No abscess or ascites is evident in the abdomen or pelvis. Appendix does not appear to be involved with the terminal ileitis. No appendiceal inflammation evident. Musculoskeletal: No sacroiliitis. No blastic or lytic bone lesions. No intramuscular or abdominal wall lesion evident. IMPRESSION: 1. Evidence of terminal ileitis consistent with Crohn's disease. 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 A stricture in the terminal ileal region remains, although the there is no frank bowel obstruction. The bowel proximal to this stricture does not appear significantly distended. No frank abscess in this area. No new evidence of inflammation in the terminal ileal region compared to prior study. 2.  Bowel elsewhere appears unremarkable. 3.  No abscess elsewhere in the abdomen or pelvis. 4. No evident renal or ureteral calculi. No hydronephrosis. Urinary bladder wall  thickness normal. Electronically Signed   By: WLowella GripIII M.D.   On: 11/29/2018 09:57    Procedures Procedures (including critical care time)  Medications Ordered in ED Medications  sodium chloride (PF) 0.9 % injection (has no administration in time range)  sodium chloride flush (NS) 0.9 % injection 3 mL (3 mLs Intravenous Given 11/29/18 0725)  sodium chloride 0.9 % bolus 1,000 mL (0 mLs Intravenous Stopped 11/29/18 0923)  morphine 4 MG/ML injection 4 mg (4 mg Intravenous Given 11/29/18 0723)  ondansetron (ZOFRAN) injection 4 mg (4 mg Intravenous Given 11/29/18 0722)  morphine 4 MG/ML injection 6 mg (6 mg Intravenous Given 11/29/18 0921)  iohexol (OMNIPAQUE) 300  MG/ML solution 100 mL (100 mLs Intravenous Contrast Given 11/29/18 0935)     Initial Impression / Assessment and Plan / ED Course  I have reviewed the triage vital signs and the nursing notes.  Pertinent labs & imaging results that were available during my care of the patient were reviewed by me and considered in my medical decision making (see chart for details).       38 year old with a recurrent history of Crohn's presents to the ED with complaints of abdominal pain x1 month.  Patient was recently hospitalized for an intestinal obstruction, opted for leaving AMA, has not had any GI follow-up.  He arrived in the ED today complaining of a sharp abdominal pain for the past month.  He has not taken any medications for improvement in his symptoms.  Does report pain is somewhat relieved after defecation, there is been no blood in his stool.  He also reports not taking any Crohn's medications, discontinue following up with his GI in the past 7 months as he reports "nothing was getting done ".  7:15 AM ordered Zofran along with morphine to help with patient's symptoms, patient is refusing morphine in is requesting Dilaudid.  According to his chart which I have reviewed he was given morphine in the past with improvement in  symptoms. CMP without any electrolyte derangement, creatinine level is within normal limits.  LFTs are unremarkable, low suspicion for any gallbladder pathology.  CBC without any leukocytosis.  Lipase level slightly elevated.  UA is unremarkable.  7:30 AM Patient agreeable to take morphine for his pain.   CT abdomen was delayed due to staffing reasons.  CT abdomen showed:  1. Evidence of terminal ileitis consistent with Crohn's disease.  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 A stricture in the terminal ileal region  remains, although the there is no frank bowel obstruction. The bowel  proximal to this stricture does not appear significantly distended.  No frank abscess in this area. No new evidence of inflammation in  the terminal ileal region compared to prior study.    2. Bowel elsewhere appears unremarkable.    3. No abscess elsewhere in the abdomen or pelvis.    4. No evident renal or ureteral calculi. No hydronephrosis. Urinary  bladder wall thickness normal.     These results were discussed at length with patient who is currently eating and drinking appropriately.  He has had no further episodes of vomiting.  He reports his pain was improved after 2 rounds of morphine.  He is nontoxic-appearing, remains afebrile without any tachycardia.  Patient was advised to follow-up with gastroenterology, I have reviewed his chart extensively, see that he had been dismissed from [facility] gastroenterology due to several missed appointments.  Patient is asking for referral to a different GI.  I am unable to place patient on steroids today for his likely Crohn's flareup as patient does not have any GI follow-up and this would involve 4 weeks of prednisone medication.  I have discussed patient with Dr. Ahmed Prima who also agrees with plan and management.  Portions of this note were generated with [employer]. Dictation errors  may occur despite best attempts at proofreading.  Final Clinical Impressions(s) / ED Diagnoses   Final diagnoses:  Generalized abdominal pain    ED Discharge Orders    None       Janeece Fitting, PA-C 11/29/18 1043    Ezequiel Essex, MD 11/30/18 [phone]

## 2019-05-18 ENCOUNTER — Other Ambulatory Visit: Payer: Self-pay

## 2019-05-18 ENCOUNTER — Emergency Department (HOSPITAL_COMMUNITY)
Admission: EM | Admit: 2019-05-18 | Discharge: 2019-05-18 | Disposition: A | Discharge status: Home / Self Care | Payer: Medicare Other | Source: Home / Self Care | Attending: Emergency Medicine | Admitting: Emergency Medicine

## 2019-05-18 ENCOUNTER — Emergency Department (HOSPITAL_COMMUNITY): Payer: Medicare Other

## 2019-05-18 ENCOUNTER — Encounter (HOSPITAL_COMMUNITY): Payer: Self-pay | Admitting: Emergency Medicine

## 2019-05-18 DIAGNOSIS — R109 Unspecified abdominal pain: Secondary | ICD-10-CM | POA: Diagnosis not present

## 2019-05-18 DIAGNOSIS — Z87891 Personal history of nicotine dependence: Secondary | ICD-10-CM | POA: Insufficient documentation

## 2019-05-18 DIAGNOSIS — K50913 Crohn's disease, unspecified, with fistula: Secondary | ICD-10-CM | POA: Insufficient documentation

## 2019-05-18 DIAGNOSIS — K50813 Crohn's disease of both small and large intestine with fistula: Secondary | ICD-10-CM | POA: Diagnosis not present

## 2019-05-18 DIAGNOSIS — I10 Essential (primary) hypertension: Secondary | ICD-10-CM | POA: Insufficient documentation

## 2019-05-18 LAB — COMPREHENSIVE METABOLIC PANEL
ALT: 18 U/L (ref 0–44)
AST: 18 U/L (ref 15–41)
Albumin: 3.2 g/dL — ABNORMAL LOW (ref 3.5–5.0)
Alkaline Phosphatase: 98 U/L (ref 38–126)
Anion gap: 14 (ref 5–15)
BUN: 9 mg/dL (ref 6–20)
CO2: 24 mmol/L (ref 22–32)
Calcium: 9.2 mg/dL (ref 8.9–10.3)
Chloride: 106 mmol/L (ref 98–111)
Creatinine, Ser: 0.96 mg/dL (ref 0.61–1.24)
GFR calc Af Amer: >60 mL/min (ref >60)
GFR calc non Af Amer: >60 mL/min (ref >60)
Glucose, Bld: 138 mg/dL — ABNORMAL HIGH (ref 70–99)
Potassium: 3.7 mmol/L (ref 3.5–5.1)
Sodium: 144 mmol/L (ref 135–145)
Total Bilirubin: 0.6 mg/dL (ref 0.3–1.2)
Total Protein: 6.3 g/dL — ABNORMAL LOW (ref 6.5–8.1)

## 2019-05-18 LAB — URINALYSIS, ROUTINE W REFLEX MICROSCOPIC
Bilirubin Urine: NEGATIVE
Glucose, UA: NEGATIVE mg/dL
Hgb urine dipstick: NEGATIVE
Ketones, ur: NEGATIVE mg/dL
Leukocytes,Ua: NEGATIVE
Nitrite: NEGATIVE
Protein, ur: NEGATIVE mg/dL
Specific Gravity, Urine: 1.014 (ref 1.005–1.030)
pH: 5 (ref 5.0–8.0)

## 2019-05-18 LAB — CBC
HCT: 39.7 % (ref 39.0–52.0)
Hemoglobin: 13.4 g/dL (ref 13.0–17.0)
MCH: 29.9 pg (ref 26.0–34.0)
MCHC: 33.8 g/dL (ref 30.0–36.0)
MCV: 88.6 fL (ref 80.0–100.0)
Platelets: 457 10*3/uL — ABNORMAL HIGH (ref 150–400)
RBC: 4.48 MIL/uL (ref 4.22–5.81)
RDW: 14.3 % (ref 11.5–15.5)
WBC: 11.1 10*3/uL — ABNORMAL HIGH (ref 4.0–10.5)
nRBC: 0 % (ref 0.0–0.2)

## 2019-05-18 MED ORDER — IOHEXOL 300 MG/ML  SOLN
100.0000 mL | Freq: Once | INTRAMUSCULAR | Status: AC | PRN
  Administered 2019-05-18: 100 mL via INTRAVENOUS

## 2019-05-18 MED ORDER — PREDNISONE 10 MG PO TABS: 40.0000 mg | ORAL_TABLET | Freq: Every day | ORAL | 0 refills | Status: DC

## 2019-05-18 MED ORDER — HYDROCODONE-ACETAMINOPHEN 5-325 MG PO TABS
1.0000 | ORAL_TABLET | Freq: Once | ORAL | Status: AC
  Administered 2019-05-18: 1 via ORAL
  Filled 2019-05-18: qty 1

## 2019-05-18 MED ORDER — SODIUM CHLORIDE 0.9 % IV BOLUS
1000.0000 mL | Freq: Once | INTRAVENOUS | Status: AC
  Administered 2019-05-18: 1000 mL via INTRAVENOUS

## 2019-05-18 MED ORDER — MORPHINE SULFATE (PF) 4 MG/ML IV SOLN
4.0000 mg | Freq: Once | INTRAVENOUS | Status: AC
  Administered 2019-05-18: 4 mg via INTRAVENOUS
  Filled 2019-05-18: qty 1

## 2019-05-18 MED ORDER — ONDANSETRON HCL 4 MG/2ML IJ SOLN
4.0000 mg | Freq: Once | INTRAMUSCULAR | Status: AC
  Administered 2019-05-18: 4 mg via INTRAVENOUS
  Filled 2019-05-18: qty 2

## 2019-05-18 NOTE — ED Notes (Signed)
Pt stated he was going to step outside

## 2019-05-18 NOTE — ED Provider Notes (Signed)
[place] EMERGENCY DEPARTMENT Provider Note   CSN: [id number] Arrival date & time: 05/18/19  0107     History Chief Complaint  Patient presents with  . Crohn's Disease    Jeffery Mcintosh is a 39 y.o. male.  39yo male with history of crohn's disease presents with complaint of abdominal pain with nausea, non bloody diarrhea and bloating. Patient reports onset of symptoms 1.5 weeks ago, not improving/progressively worsening. Pain is worse after eating, which is different than prior episodes of pain, more bloating after eating. Denies vomiting, fevers, chills. States he is having difficulty voiding, denies dysuria, frequency. Patient states he was recently seen in [facility] by GI, had endoscopy and colonoscopy and was told his case was too severe and was referred to Dr. Nyoka Cowden at [facility], is awaiting 08/04/19 appointment.         Past Medical History:  Diagnosis Date  . Anxiety   . Arthritis   . Bipolar affective ([place])   . Chronic headaches   . Crohn's disease ([place]) history of  . Depression   . GERD (gastroesophageal reflux disease)   . Hypertension   . Noncompliance 04/17/2018  . PTSD (post-traumatic stress disorder) 04/17/2018  . Schizophrenic disorder [facility])     Patient Active Problem List   Diagnosis Date Noted  . Crohn's colitis, with intestinal obstruction ([place]) 10/11/2018  . Crohn's disease of colon with intestinal obstruction ([place])   . Noncompliance 04/17/2018  . PTSD (post-traumatic stress disorder) 04/17/2018  . Crohn's disease of jejunum with intestinal obstruction ([place]) 10/26/2017  . SBO (small bowel obstruction) ([place]) 10/26/2017  . Bipolar 1 disorder ([place]) 10/26/2017  . Crohn's colitis, unspecified complication ([place]) 27/51/7001  . Epigastric burning sensation 01/03/2015  . Numbness and tingling in left arm 11/15/2014  . Exacerbation of Crohn's disease ([place]) 10/21/2014  . Crohn's disease ([place]) 10/21/2014  . Diarrhea 10/21/2014  .  Abdominal pain   . Gout 04/18/2013  . Cubital tunnel syndrome 04/18/2013  . Abnormal urinalysis 04/18/2013    Past Surgical History:  Procedure Laterality Date  . NO PAST SURGERIES         Family History  Problem Relation Age of Onset  . Hypertension Mother   . Heart attack Mother   . Stroke Mother   . Alcohol abuse Father   . Mental illness Father   . Multiple sclerosis Father   . Arthritis Maternal Grandmother   . Hypertension Maternal Grandmother   . Heart disease Maternal Grandmother   . Kidney disease Maternal Grandmother   . Diabetes Maternal Grandmother   . Cancer Maternal Grandmother        great, type unknown  . Hyperlipidemia Maternal Grandfather   . Colon polyps Maternal Grandfather   . Stroke Paternal Grandmother   . Ulcerative colitis Maternal Aunt     Social History   Tobacco Use  . Smoking status: Former Smoker    Packs/day: 0.50    Years: 15.00    Pack years: 7.50    Types: Cigars  . Smokeless tobacco: Never Used  Substance Use Topics  . Alcohol use: No    Alcohol/week: 0.0 standard drinks  . Drug use: No    Home Medications Prior to Admission medications   Medication Sig Start Date End Date Taking? Authorizing Provider  amphetamine-dextroamphetamine (ADDERALL XR) 25 MG 24 hr capsule Take 25 mg by mouth every morning. 04/14/19  Yes [provider]  FLUoxetine (PROZAC) 10 MG capsule Take 20 mg by mouth daily. 05/10/19  Yes [provider]  Multiple Vitamin (MULTIVITAMIN WITH MINERALS) TABS tablet Take 1 tablet by mouth daily.   Yes [provider]  predniSONE (DELTASONE) 10 MG tablet Take 4 tablets (40 mg total) by mouth daily for 5 days. 05/18/19 05/23/19  Tacy Learn, PA-C  omeprazole (PRILOSEC) 40 MG capsule Take 1 capsule (40 mg total) by mouth daily. Patient not taking: Reported on 01/18/2018 10/17/16 08/07/18  Nita Sells, MD  sucralfate (CARAFATE) 1 g tablet Take 1 tablet (1 g total) by mouth 4 (four)  times daily -  with meals and at bedtime. Patient not taking: Reported on 04/17/2018 01/18/18 08/07/18  Varney Biles, MD    Allergies    Patient has no known allergies.  Review of Systems   Review of Systems  Constitutional: Negative for fever.  Respiratory: Negative for shortness of breath.   Cardiovascular: Negative for chest pain.  Gastrointestinal: Positive for abdominal distention, abdominal pain, diarrhea and nausea. Negative for blood in stool, constipation and vomiting.  Genitourinary: Positive for difficulty urinating. Negative for dysuria and frequency.  Musculoskeletal: Negative for back pain.  Skin: Negative for rash and wound.  Neurological: Negative for weakness.  All other systems reviewed and are negative.   Physical Exam Updated Vital Signs BP 128/78 (BP Location: Left Arm)   Pulse 72   Temp 98.4 F (36.9 C) (Oral)   Resp 18   Ht 5' 3"  (1.6 m)   Wt 70 kg   SpO2 98%   BMI 27.34 kg/m   Physical Exam  ED Results / Procedures / Treatments   Labs (all labs ordered are listed, but only abnormal results are displayed) Labs Reviewed  COMPREHENSIVE METABOLIC PANEL - Abnormal; Notable for the following components:      Result Value   Glucose, Bld 138 (*)    Total Protein 6.3 (*)    Albumin 3.2 (*)    All other components within normal limits  CBC - Abnormal; Notable for the following components:   WBC 11.1 (*)    Platelets 457 (*)    All other components within normal limits  URINALYSIS, ROUTINE W REFLEX MICROSCOPIC    EKG None  Radiology CT Abdomen Pelvis W Contrast  Result Date: 05/18/2019 CLINICAL DATA:  Right lower quadrant pain for 2 weeks. History of Crohn disease. EXAM: CT ABDOMEN AND PELVIS WITH CONTRAST TECHNIQUE: Multidetector CT imaging of the abdomen and pelvis was performed using the standard protocol following bolus administration of intravenous contrast. CONTRAST:  1108m OMNIPAQUE IOHEXOL 300 MG/ML  SOLN COMPARISON:  11/29/2018 FINDINGS:  Lower chest: Clear lung bases. Normal heart size without pericardial or pleural effusion. Hepatobiliary: Normal liver. Normal gallbladder, without biliary ductal dilatation. Pancreas: Normal, without mass or ductal dilatation. Spleen: Normal in size, without focal abnormality. Adrenals/Urinary Tract: Normal adrenal glands. Normal kidneys, without hydronephrosis. Normal urinary bladder. Stomach/Bowel: Normal stomach, without wall thickening. Moderate to marked inflammation involves the distal and terminal ileum, slightly increased compared to 01/04/2019. Progressive presumably secondary involvement of the cecum. Example 51/3. Fistulous are again identified between the terminal and distal ileum, including on this image. No surrounding abscess. The appendix is at least partially visualized including on 51/3 and uninvolved. No free intraperitoneal air. Small bowel loops just proximal to the inflammation measure up to 2.9 cm. Apparent wall thickening of mid small bowel loops including on 44/3 may be due to underdistention. Vascular/Lymphatic: Aortic atherosclerosis. No abdominopelvic adenopathy. Reproductive: Normal prostate. Other: No significant free fluid. Musculoskeletal: No sacroiliitis. IMPRESSION: 1. Active  inflammation/Crohn disease involving the distal/terminal ileum and adjacent cecum. Progressive compared to 01/04/2019. Areas of fistulous communication between small bowel loops again identified. No drainable abscess. 2. Small bowel just proximal to the inflammation measures up to 2.9 cm. Cannot exclude low-grade partial small-bowel obstruction. 3. Mid small bowel loops appear thick walled, but are underdistended. Equivocal for more proximal Crohn disease. 4.  Aortic Atherosclerosis (ICD10-I70.0). Electronically Signed   By: Abigail Miyamoto M.D.   On: 05/18/2019 09:45    Procedures Procedures (including critical care time)  Medications Ordered in ED Medications  HYDROcodone-acetaminophen (NORCO/VICODIN)  5-325 MG per tablet 1 tablet (has no administration in time range)  sodium chloride 0.9 % bolus 1,000 mL (0 mLs Intravenous Stopped 05/18/19 1018)  ondansetron (ZOFRAN) injection 4 mg (4 mg Intravenous Given 05/18/19 0908)  morphine 4 MG/ML injection 4 mg (4 mg Intravenous Given 05/18/19 0911)  iohexol (OMNIPAQUE) 300 MG/ML solution 100 mL (100 mLs Intravenous Contrast Given 05/18/19 0086)    ED Course  I have reviewed the triage vital signs and the nursing notes.  Pertinent labs & imaging results that were available during my care of the patient were reviewed by me and considered in my medical decision making (see chart for details).  Clinical Course as of May 17 1048  Wed Apr 14, 397  [id number] 39 year old male with history of Crohn's disease presents with a week and a half of abdominal pain with bloating, nonbloody diarrhea, nausea.  Denies fevers, vomiting.  Patient is scheduled to see GI at [facility] on July 1 however due to prolonged pain episode became concerned and came to the ER.  On exam patient has mild generalized abdominal tenderness, is otherwise well-appearing.  Patient is afebrile with stable vital signs.  Labs reviewed, mild leukocytosis white count 11,000, urinalysis is normal, CMP without significant changes today.  CT abdomen pelvis with worsening of disease process, compared to December 2020 scan, shows fistulas with out abscess.  Patient is passing stools, no vomiting, do not suspect bowel obstruction at this time.  On recheck, patient states his pain has improved, he is feeling better.  Call to GI at [facility], patient was able to be rescheduled for June 22, he is agreeable and aware of this appointment, advised patient to contact the office to confirm the appointment and review information on file.  Patient to return to ER for fevers, worsening pain or other concerns.  Discussed with Dr. Wilson Singer, ER attending, will place patient on short course of prednisone.   [LM]    Clinical Course User  Index [LM] Roque Lias   MDM Rules/Calculators/A&P                      Final Clinical Impression(s) / ED Diagnoses Final diagnoses:  Acute Crohn's disease with fistula [facility])    Rx / DC Orders ED Discharge Orders         Ordered    predniSONE (DELTASONE) 10 MG tablet  Daily     05/18/19 1033           Tacy Learn, PA-C 05/18/19 1050    Virgel Manifold, MD 05/21/19 [phone]

## 2019-05-18 NOTE — ED Triage Notes (Signed)
Patient reports RLQ pain for 2 weeks with nausea, no emesis or fever , occasional diarrhea .

## 2019-05-18 NOTE — ED Notes (Signed)
Pt. Returned from CT.

## 2019-05-18 NOTE — ED Notes (Signed)
Patient transported to CT 

## 2019-05-18 NOTE — Discharge Instructions (Addendum)
Take Prednisone as prescribed and complete the full course. 07/26/19 at 10:30 Dr. Koleen Distance, arrive at 10:15 with photo id, insurance card. Wear a mask.

## 2019-05-19 ENCOUNTER — Inpatient Hospital Stay (HOSPITAL_COMMUNITY)
Admission: EM | Admit: 2019-05-19 | Discharge: 2019-05-25 | DRG: 386 | Disposition: A | Discharge status: Other Acute Inpatient Hospital | Payer: Medicare Other | Attending: Internal Medicine | Admitting: Internal Medicine

## 2019-05-19 ENCOUNTER — Emergency Department (HOSPITAL_COMMUNITY): Payer: Medicare Other

## 2019-05-19 ENCOUNTER — Other Ambulatory Visit: Payer: Self-pay

## 2019-05-19 ENCOUNTER — Encounter (HOSPITAL_COMMUNITY): Payer: Self-pay | Admitting: Internal Medicine

## 2019-05-19 DIAGNOSIS — Z7952 Long term (current) use of systemic steroids: Secondary | ICD-10-CM

## 2019-05-19 DIAGNOSIS — R739 Hyperglycemia, unspecified: Secondary | ICD-10-CM | POA: Diagnosis not present

## 2019-05-19 DIAGNOSIS — K50813 Crohn's disease of both small and large intestine with fistula: Principal | ICD-10-CM | POA: Diagnosis present

## 2019-05-19 DIAGNOSIS — T1491XA Suicide attempt, initial encounter: Secondary | ICD-10-CM | POA: Diagnosis not present

## 2019-05-19 DIAGNOSIS — Z046 Encounter for general psychiatric examination, requested by authority: Secondary | ICD-10-CM | POA: Diagnosis not present

## 2019-05-19 DIAGNOSIS — K219 Gastro-esophageal reflux disease without esophagitis: Secondary | ICD-10-CM | POA: Diagnosis present

## 2019-05-19 DIAGNOSIS — F4310 Post-traumatic stress disorder, unspecified: Secondary | ICD-10-CM | POA: Diagnosis present

## 2019-05-19 DIAGNOSIS — K5010 Crohn's disease of large intestine without complications: Secondary | ICD-10-CM | POA: Diagnosis present

## 2019-05-19 DIAGNOSIS — Z87891 Personal history of nicotine dependence: Secondary | ICD-10-CM

## 2019-05-19 DIAGNOSIS — D6489 Other specified anemias: Secondary | ICD-10-CM | POA: Diagnosis present

## 2019-05-19 DIAGNOSIS — Z79899 Other long term (current) drug therapy: Secondary | ICD-10-CM

## 2019-05-19 DIAGNOSIS — F319 Bipolar disorder, unspecified: Secondary | ICD-10-CM | POA: Diagnosis present

## 2019-05-19 DIAGNOSIS — K50113 Crohn's disease of large intestine with fistula: Secondary | ICD-10-CM | POA: Diagnosis not present

## 2019-05-19 DIAGNOSIS — D638 Anemia in other chronic diseases classified elsewhere: Secondary | ICD-10-CM | POA: Diagnosis present

## 2019-05-19 DIAGNOSIS — I10 Essential (primary) hypertension: Secondary | ICD-10-CM | POA: Diagnosis present

## 2019-05-19 DIAGNOSIS — Z9119 Patient's noncompliance with other medical treatment and regimen: Secondary | ICD-10-CM

## 2019-05-19 DIAGNOSIS — R45851 Suicidal ideations: Secondary | ICD-10-CM | POA: Diagnosis present

## 2019-05-19 DIAGNOSIS — T380X5A Adverse effect of glucocorticoids and synthetic analogues, initial encounter: Secondary | ICD-10-CM | POA: Diagnosis present

## 2019-05-19 DIAGNOSIS — Z20822 Contact with and (suspected) exposure to covid-19: Secondary | ICD-10-CM | POA: Diagnosis present

## 2019-05-19 DIAGNOSIS — R140 Abdominal distension (gaseous): Secondary | ICD-10-CM

## 2019-05-19 DIAGNOSIS — F209 Schizophrenia, unspecified: Secondary | ICD-10-CM | POA: Diagnosis present

## 2019-05-19 DIAGNOSIS — G8929 Other chronic pain: Secondary | ICD-10-CM | POA: Diagnosis present

## 2019-05-19 DIAGNOSIS — Z915 Personal history of self-harm: Secondary | ICD-10-CM

## 2019-05-19 DIAGNOSIS — D72829 Elevated white blood cell count, unspecified: Secondary | ICD-10-CM | POA: Diagnosis present

## 2019-05-19 DIAGNOSIS — K50913 Crohn's disease, unspecified, with fistula: Secondary | ICD-10-CM

## 2019-05-19 DIAGNOSIS — F909 Attention-deficit hyperactivity disorder, unspecified type: Secondary | ICD-10-CM | POA: Diagnosis present

## 2019-05-19 DIAGNOSIS — K5090 Crohn's disease, unspecified, without complications: Secondary | ICD-10-CM

## 2019-05-19 DIAGNOSIS — K5900 Constipation, unspecified: Secondary | ICD-10-CM

## 2019-05-19 LAB — COMPREHENSIVE METABOLIC PANEL
ALT: 20 U/L (ref 0–44)
ALT: 24 U/L (ref 0–44)
AST: 23 U/L (ref 15–41)
AST: 31 U/L (ref 15–41)
Albumin: 3.7 g/dL (ref 3.5–5.0)
Albumin: 3.4 g/dL — ABNORMAL LOW (ref 3.5–5.0)
Alkaline Phosphatase: 90 U/L (ref 38–126)
Alkaline Phosphatase: 87 U/L (ref 38–126)
Anion gap: 13 (ref 5–15)
Anion gap: 9 (ref 5–15)
BUN: 8 mg/dL (ref 6–20)
BUN: 12 mg/dL (ref 6–20)
CO2: 28 mmol/L (ref 22–32)
CO2: 27 mmol/L (ref 22–32)
Calcium: 9.1 mg/dL (ref 8.9–10.3)
Calcium: 8.4 mg/dL — ABNORMAL LOW (ref 8.9–10.3)
Chloride: 107 mmol/L (ref 98–111)
Chloride: 106 mmol/L (ref 98–111)
Creatinine, Ser: 0.84 mg/dL (ref 0.61–1.24)
Creatinine, Ser: 0.78 mg/dL (ref 0.61–1.24)
GFR calc Af Amer: >60 mL/min (ref >60)
GFR calc Af Amer: >60 mL/min (ref >60)
GFR calc non Af Amer: >60 mL/min (ref >60)
GFR calc non Af Amer: >60 mL/min (ref >60)
Glucose, Bld: 85 mg/dL (ref 70–99)
Glucose, Bld: 114 mg/dL — ABNORMAL HIGH (ref 70–99)
Potassium: 4.1 mmol/L (ref 3.5–5.1)
Potassium: 4.3 mmol/L (ref 3.5–5.1)
Sodium: 148 mmol/L — ABNORMAL HIGH (ref 135–145)
Sodium: 142 mmol/L (ref 135–145)
Total Bilirubin: 0.3 mg/dL (ref 0.3–1.2)
Total Bilirubin: 0.3 mg/dL (ref 0.3–1.2)
Total Protein: 7.1 g/dL (ref 6.5–8.1)
Total Protein: 6.4 g/dL — ABNORMAL LOW (ref 6.5–8.1)

## 2019-05-19 LAB — CBC WITH DIFFERENTIAL/PLATELET
Abs Immature Granulocytes: 0.04 10*3/uL (ref 0.00–0.07)
Basophils Absolute: 0.1 10*3/uL (ref 0.0–0.1)
Basophils Relative: 1 %
Eosinophils Absolute: 0.1 10*3/uL (ref 0.0–0.5)
Eosinophils Relative: 1 %
HCT: 41.7 % (ref 39.0–52.0)
Hemoglobin: 14 g/dL (ref 13.0–17.0)
Immature Granulocytes: 0 %
Lymphocytes Relative: 17 %
Lymphs Abs: 1.6 10*3/uL (ref 0.7–4.0)
MCH: 30.2 pg (ref 26.0–34.0)
MCHC: 33.6 g/dL (ref 30.0–36.0)
MCV: 89.9 fL (ref 80.0–100.0)
Monocytes Absolute: 0.8 10*3/uL (ref 0.1–1.0)
Monocytes Relative: 9 %
Neutro Abs: 6.6 10*3/uL (ref 1.7–7.7)
Neutrophils Relative %: 72 %
Platelets: 467 10*3/uL — ABNORMAL HIGH (ref 150–400)
RBC: 4.64 MIL/uL (ref 4.22–5.81)
RDW: 14.6 % (ref 11.5–15.5)
WBC: 9.2 10*3/uL (ref 4.0–10.5)
nRBC: 0 % (ref 0.0–0.2)

## 2019-05-19 LAB — PHOSPHORUS: Phosphorus: 3.1 mg/dL (ref 2.5–4.6)

## 2019-05-19 LAB — CBC
HCT: 37.9 % — ABNORMAL LOW (ref 39.0–52.0)
Hemoglobin: 12.8 g/dL — ABNORMAL LOW (ref 13.0–17.0)
MCH: 30.4 pg (ref 26.0–34.0)
MCHC: 33.8 g/dL (ref 30.0–36.0)
MCV: 90 fL (ref 80.0–100.0)
Platelets: 466 10*3/uL — ABNORMAL HIGH (ref 150–400)
RBC: 4.21 MIL/uL — ABNORMAL LOW (ref 4.22–5.81)
RDW: 14.6 % (ref 11.5–15.5)
WBC: 13.9 10*3/uL — ABNORMAL HIGH (ref 4.0–10.5)
nRBC: 0 % (ref 0.0–0.2)

## 2019-05-19 LAB — RAPID URINE DRUG SCREEN, HOSP PERFORMED
Amphetamines: NOT DETECTED
Barbiturates: NOT DETECTED
Benzodiazepines: NOT DETECTED
Cocaine: NOT DETECTED
Opiates: POSITIVE — AB
Tetrahydrocannabinol: NOT DETECTED

## 2019-05-19 LAB — MAGNESIUM: Magnesium: 2 mg/dL (ref 1.7–2.4)

## 2019-05-19 LAB — ETHANOL: Alcohol, Ethyl (B): 16 mg/dL — ABNORMAL HIGH (ref <10)

## 2019-05-19 LAB — HIV ANTIBODY (ROUTINE TESTING W REFLEX): HIV Screen 4th Generation wRfx: NONREACTIVE

## 2019-05-19 LAB — RESPIRATORY PANEL BY RT PCR (FLU A&B, COVID)
Influenza A by PCR: NEGATIVE
Influenza B by PCR: NEGATIVE
SARS Coronavirus 2 by RT PCR: NEGATIVE

## 2019-05-19 LAB — ACETAMINOPHEN LEVEL: Acetaminophen (Tylenol), Serum: <10 ug/mL — ABNORMAL LOW (ref 10–30)

## 2019-05-19 LAB — SALICYLATE LEVEL: Salicylate Lvl: <7 mg/dL — ABNORMAL LOW (ref 7.0–30.0)

## 2019-05-19 MED ORDER — MORPHINE SULFATE (PF) 4 MG/ML IV SOLN
4.0000 mg | Freq: Once | INTRAVENOUS | Status: AC
  Administered 2019-05-19: 06:00:00 4 mg via INTRAVENOUS
  Filled 2019-05-19: qty 1

## 2019-05-19 MED ORDER — LORAZEPAM 1 MG PO TABS
1.0000 mg | ORAL_TABLET | Freq: Once | ORAL | Status: AC
  Administered 2019-05-19: 1 mg via ORAL
  Filled 2019-05-19: qty 1

## 2019-05-19 MED ORDER — PANTOPRAZOLE SODIUM 40 MG PO TBEC
40.0000 mg | DELAYED_RELEASE_TABLET | Freq: Every day | ORAL | Status: DC
  Administered 2019-05-19 – 2019-05-25 (×7): 40 mg via ORAL
  Filled 2019-05-19 (×6): qty 1

## 2019-05-19 MED ORDER — AMPHETAMINE-DEXTROAMPHET ER 5 MG PO CP24
25.0000 mg | ORAL_CAPSULE | Freq: Every morning | ORAL | Status: DC
  Administered 2019-05-19 – 2019-05-25 (×7): 25 mg via ORAL
  Filled 2019-05-19 (×7): qty 5

## 2019-05-19 MED ORDER — SODIUM CHLORIDE 0.9 % IV SOLN: INTRAVENOUS | Status: DC

## 2019-05-19 MED ORDER — ONDANSETRON HCL 4 MG PO TABS
4.0000 mg | ORAL_TABLET | Freq: Four times a day (QID) | ORAL | Status: DC | PRN
  Administered 2019-05-20: 08:00:00 4 mg via ORAL
  Filled 2019-05-19: qty 1

## 2019-05-19 MED ORDER — FOLIC ACID 1 MG PO TABS
1.0000 mg | ORAL_TABLET | Freq: Every day | ORAL | Status: DC
  Administered 2019-05-19 – 2019-05-25 (×7): 1 mg via ORAL
  Filled 2019-05-19 (×7): qty 1

## 2019-05-19 MED ORDER — LORAZEPAM 1 MG PO TABS: 1.0000 mg | ORAL_TABLET | ORAL | Status: AC | PRN

## 2019-05-19 MED ORDER — ONDANSETRON HCL 4 MG/2ML IJ SOLN
4.0000 mg | Freq: Four times a day (QID) | INTRAMUSCULAR | Status: DC | PRN
  Administered 2019-05-19 – 2019-05-21 (×3): 4 mg via INTRAVENOUS
  Filled 2019-05-19 (×3): qty 2

## 2019-05-19 MED ORDER — METHYLPREDNISOLONE SODIUM SUCC 40 MG IJ SOLR
40.0000 mg | Freq: Two times a day (BID) | INTRAMUSCULAR | Status: DC
  Administered 2019-05-19 – 2019-05-23 (×9): 40 mg via INTRAVENOUS
  Filled 2019-05-19 (×9): qty 1

## 2019-05-19 MED ORDER — LORAZEPAM 2 MG/ML IJ SOLN
1.0000 mg | INTRAMUSCULAR | Status: AC | PRN
  Administered 2019-05-19: 22:00:00 1 mg via INTRAVENOUS
  Filled 2019-05-19: qty 1

## 2019-05-19 MED ORDER — ADULT MULTIVITAMIN W/MINERALS CH
1.0000 | ORAL_TABLET | Freq: Every day | ORAL | Status: DC
  Filled 2019-05-19: qty 1

## 2019-05-19 MED ORDER — THIAMINE HCL 100 MG/ML IJ SOLN: 100.0000 mg | Freq: Every day | INTRAMUSCULAR | Status: DC

## 2019-05-19 MED ORDER — THIAMINE HCL 100 MG PO TABS
100.0000 mg | ORAL_TABLET | Freq: Every day | ORAL | Status: DC
  Administered 2019-05-19 – 2019-05-25 (×7): 100 mg via ORAL
  Filled 2019-05-19 (×7): qty 1

## 2019-05-19 MED ORDER — MORPHINE SULFATE (PF) 2 MG/ML IV SOLN: 2.0000 mg | INTRAVENOUS | Status: DC | PRN

## 2019-05-19 MED ORDER — PREDNISONE 20 MG PO TABS: 40.0000 mg | ORAL_TABLET | Freq: Every day | ORAL | Status: DC

## 2019-05-19 MED ORDER — ONDANSETRON HCL 4 MG/2ML IJ SOLN
4.0000 mg | Freq: Once | INTRAMUSCULAR | Status: AC
  Administered 2019-05-19: 06:00:00 4 mg via INTRAVENOUS
  Filled 2019-05-19: qty 2

## 2019-05-19 MED ORDER — HYDROMORPHONE HCL 1 MG/ML IJ SOLN
0.5000 mg | INTRAMUSCULAR | Status: DC | PRN
  Administered 2019-05-19 – 2019-05-20 (×8): 0.5 mg via INTRAVENOUS
  Filled 2019-05-19 (×8): qty 0.5

## 2019-05-19 MED ORDER — ADULT MULTIVITAMIN W/MINERALS CH
1.0000 | ORAL_TABLET | Freq: Every day | ORAL | Status: DC
  Administered 2019-05-19 – 2019-05-25 (×7): 1 via ORAL
  Filled 2019-05-19 (×7): qty 1

## 2019-05-19 MED ORDER — IBUPROFEN 800 MG PO TABS
800.0000 mg | ORAL_TABLET | Freq: Once | ORAL | Status: AC
  Administered 2019-05-19: 02:00:00 800 mg via ORAL
  Filled 2019-05-19: qty 1

## 2019-05-19 MED ORDER — FLUOXETINE HCL 20 MG PO CAPS
20.0000 mg | ORAL_CAPSULE | Freq: Every day | ORAL | Status: DC
  Administered 2019-05-19 – 2019-05-25 (×7): 20 mg via ORAL
  Filled 2019-05-19 (×7): qty 1

## 2019-05-19 MED ORDER — METHYLPREDNISOLONE SODIUM SUCC 125 MG IJ SOLR
60.0000 mg | Freq: Once | INTRAMUSCULAR | Status: AC
  Administered 2019-05-19: 06:00:00 60 mg via INTRAVENOUS
  Filled 2019-05-19: qty 2

## 2019-05-19 NOTE — Progress Notes (Signed)
Jeffery Grumbling, NP recommends inpt tx. [facility] at capacity per [facility]. TTS to seek placement. EDP Ward, Delice Bison, DO and [place], [place], RN have been advised.

## 2019-05-19 NOTE — Progress Notes (Signed)
Jeffery Mcintosh is a 39 y.o. male with a history of suicide attempts, Crohn's disease, GERD who was admitted early this morning by Dr. Gloris Ham for Crohn's flare and suicide attempt.  Currently, patient states he is feeling improved, pain is better controlled with steroids and dilaudid.   Patient endorsed suicide attempt yesterday after trying to hang himself with an electric cable however the cable was too long. He states that when the police arrived he had another attempt at hanging himself but the cable came undone. After this did not work, he states he purposefully reached for the policeman's gun in an attempt of police induced suicide by gun shot but was instead transported to the ED.   Reports that his symptoms have been poorly controlled for several years and was upset that he feels he has been turned away from multiple healthcare providers in the past. He feels that 'nobody cares' and this likely led to his suicide attempt. I provided reassurance.    PE: General: Awake and alert, no acute distress  Heart: S1 and S2 auscultated, no murmurs  Lungs: Clear to auscultation bilaterally, no wheeze  GI: nontender    A/P  1. Crohn's flare - GI consulted. Continue steroids 2. Suicide attempt - psych consulted, he will need inpatient psych after treatment of his Crohn's    Harold Hedge, DO [facility] Pager [phone]

## 2019-05-19 NOTE — Consult Note (Signed)
Referring Provider: Dr. Jennette Kettle Primary Care Physician:  Patient, No Pcp Per Primary Gastroenterologist: Althia Forts  Reason for Consultation: Abdominal pain, Crohn's disease flare with fistula  HPI: Jeffery Mcintosh is a 39 y.o. male with history of Crohn's disease, GERD, and recent suicide attempt presenting with a chief complaint of abdominal pain.  Patient reports he has been having worsening abdominal pain for the last 1.5 weeks.  He is also had increased stool frequency over the last couple of weeks.  He has been having 5-10 liquid bowel movements per day.  He denies any melena or hematochezia.  He reports that his pain is currently an upper abdomen but oftentimes is in the bilateral lower quadrants.    He had some nausea a few days ago with an episode of hematemesis 5 days ago.  Has not had any additional emesis since that time.  He denies any hematemesis.  He endorses heartburn and has had some odynophagia last week but does not currently have any painful swallowing.  He denies any dysphagia.  He endorses a good appetite but states he has lost about 50 pounds in 1 year.  He denies any family history of inflammatory bowel disease or colon cancer.  Patient states he was on budesonide but ran out of medication 1 to 2 months ago.  Since that time, he has not been on any therapy for Crohn's disease.  He was diagnosed at age 23.  He was on Remicade with good effect several years ago but moved out of state.  After that, he was on Humira.  He stated that Humira did not provide any relief of symptoms.  He was then transition to Cimzia which he states did not help his symptoms.  He has had intermittent prednisone tapers during flares.  He denies any resections or abdominal surgeries.  Patient reports he had an EGD and colonoscopy 2 months ago with gastroenterologist in [facility].  He states he was referred to Dr. Nyoka Cowden at [facility] due to his disease severity.  He does not believe he had any  abnormalities on EGD at that time.  Past Medical History:  Diagnosis Date  . Anxiety   . Arthritis   . Bipolar affective ([place])   . Chronic headaches   . Crohn's disease ([place]) history of  . Depression   . GERD (gastroesophageal reflux disease)   . Hypertension   . Noncompliance 04/17/2018  . PTSD (post-traumatic stress disorder) 04/17/2018  . Schizophrenic disorder [facility])     Past Surgical History:  Procedure Laterality Date  . NO PAST SURGERIES      Prior to Admission medications   Medication Sig Start Date End Date Taking? Authorizing Provider  amphetamine-dextroamphetamine (ADDERALL XR) 25 MG 24 hr capsule Take 25 mg by mouth every morning. 04/14/19  Yes [provider]  FLUoxetine (PROZAC) 10 MG capsule Take 20 mg by mouth daily. 05/10/19  Yes [provider]  Multiple Vitamin (MULTIVITAMIN WITH MINERALS) TABS tablet Take 1 tablet by mouth daily.   Yes [provider]  predniSONE (DELTASONE) 10 MG tablet Take 4 tablets (40 mg total) by mouth daily for 5 days. 05/18/19 05/23/19 Yes Tacy Learn, PA-C  omeprazole (PRILOSEC) 40 MG capsule Take 1 capsule (40 mg total) by mouth daily. Patient not taking: Reported on 01/18/2018 10/17/16 08/07/18  Nita Sells, MD  sucralfate (CARAFATE) 1 g tablet Take 1 tablet (1 g total) by mouth 4 (four) times daily -  with meals and at bedtime. Patient not  taking: Reported on 04/17/2018 01/18/18 08/07/18  Varney Biles, MD    Scheduled Meds: . amphetamine-dextroamphetamine  25 mg Oral q morning - 10a  . FLUoxetine  20 mg Oral Daily  . folic acid  1 mg Oral Daily  . methylPREDNISolone (SOLU-MEDROL) injection  40 mg Intravenous Q12H  . multivitamin with minerals  1 tablet Oral Daily  . multivitamin with minerals  1 tablet Oral Daily  . thiamine  100 mg Oral Daily   Or  . thiamine  100 mg Intravenous Daily   Continuous Infusions: . sodium chloride 125 mL/hr at 05/19/19 0553   PRN Meds:.HYDROmorphone (DILAUDID)  injection, LORazepam **OR** LORazepam, ondansetron **OR** ondansetron (ZOFRAN) IV  Allergies as of 05/19/2019  . (No Known Allergies)    Family History  Problem Relation Age of Onset  . Hypertension Mother   . Heart attack Mother   . Stroke Mother   . Alcohol abuse Father   . Mental illness Father   . Multiple sclerosis Father   . Arthritis Maternal Grandmother   . Hypertension Maternal Grandmother   . Heart disease Maternal Grandmother   . Kidney disease Maternal Grandmother   . Diabetes Maternal Grandmother   . Cancer Maternal Grandmother        great, type unknown  . Hyperlipidemia Maternal Grandfather   . Colon polyps Maternal Grandfather   . Stroke Paternal Grandmother   . Ulcerative colitis Maternal Aunt     Social History   Socioeconomic History  . Marital status: Single    Spouse name: Not on file  . Number of children: 2  . Years of education: 74  . Highest education level: Not on file  Occupational History  . Occupation: disabled    Comment: Crohn's / Bipolar / Schizophrenia  Tobacco Use  . Smoking status: Former Smoker    Packs/day: 0.50    Years: 15.00    Pack years: 7.50    Types: Cigars  . Smokeless tobacco: Never Used  Substance and Sexual Activity  . Alcohol use: No    Alcohol/week: 0.0 standard drinks  . Drug use: No  . Sexual activity: Never    Birth control/protection: None  Other Topics Concern  . Not on file  Social History Narrative  . Not on file   Social Determinants of Health   Financial Resource Strain:   . Difficulty of Paying Living Expenses:   Food Insecurity:   . Worried About Charity fundraiser in the Last Year:   . Arboriculturist in the Last Year:   Transportation Needs:   . Film/video editor (Medical):   Marland Kitchen Lack of Transportation (Non-Medical):   Physical Activity:   . Days of Exercise per Week:   . Minutes of Exercise per Session:   Stress:   . Feeling of Stress :   Social Connections:   . Frequency of  Communication with Friends and Family:   . Frequency of Social Gatherings with Friends and Family:   . Attends Religious Services:   . Active Member of Clubs or Organizations:   . Attends Archivist Meetings:   Marland Kitchen Marital Status:   Intimate Partner Violence:   . Fear of Current or Ex-Partner:   . Emotionally Abused:   Marland Kitchen Physically Abused:   . Sexually Abused:     Review of Systems:  Review of Systems  Constitutional: Positive for weight loss. Negative for chills and fever.  HENT: Negative for hearing loss and tinnitus.  Eyes: Negative for pain and redness.  Respiratory: Negative for cough and shortness of breath.   Cardiovascular: Negative for chest pain and palpitations.  Gastrointestinal: Positive for abdominal pain, diarrhea, heartburn and nausea. Negative for blood in stool, constipation, melena and vomiting.  Genitourinary: Negative for dysuria and hematuria.  Musculoskeletal: Negative for back pain and neck pain.  Skin: Negative for itching and rash.  Neurological: Negative for seizures and loss of consciousness.  Endo/Heme/Allergies: Negative for environmental allergies and polydipsia.  Psychiatric/Behavioral: Positive for depression and suicidal ideas.    Physical Exam: Vital signs: Vitals:   05/19/19 0300 05/19/19 0555  BP: (!) 150/89 133/84  Pulse: 83 79  Resp:  18  Temp:    SpO2: 92% 98%   Last BM Date: 05/17/19 Physical Exam  Constitutional: He is oriented to person, place, and time. He appears well-developed and well-nourished. He appears distressed.  HENT:  Head: Normocephalic and atraumatic.  Eyes: Conjunctivae and EOM are normal. No scleral icterus.  Cardiovascular: Normal rate, regular rhythm and normal heart sounds.  Pulmonary/Chest: Effort normal and breath sounds normal. No respiratory distress.  Abdominal: Soft. Bowel sounds are normal. He exhibits no distension and no mass. There is abdominal tenderness. There is no rebound and no  guarding.  Musculoskeletal:        General: Edema present. No deformity. Normal range of motion.     Cervical back: Normal range of motion and neck supple.  Neurological: He is alert and oriented to person, place, and time.  Skin: Skin is warm and dry. No rash noted.  Psychiatric: He has a normal mood and affect. His behavior is normal.   GI:  Lab Results: Recent Labs    05/18/19 0138 05/19/19 0149 05/19/19 0833  WBC 11.1* 9.2 13.9*  HGB 13.4 14.0 12.8*  HCT 39.7 41.7 37.9*  PLT 457* 467* 466*   BMET Recent Labs    05/18/19 0138 05/19/19 0149 05/19/19 0833  NA 144 148* 142  K 3.7 4.1 4.3  CL 106 107 106  CO2 24 28 27   GLUCOSE 138* 85 114*  BUN 9 8 12   CREATININE 0.96 0.84 0.78  CALCIUM 9.2 9.1 8.4*   LFT Recent Labs    05/19/19 0833  PROT 6.4*  ALBUMIN 3.4*  AST 31  ALT 24  ALKPHOS 87  BILITOT 0.3   PT/INR No results for input(s): LABPROT, INR in the last 72 hours.   Studies/Results: CT Abdomen Pelvis W Contrast  Result Date: 05/18/2019 CLINICAL DATA:  Right lower quadrant pain for 2 weeks. History of Crohn disease. EXAM: CT ABDOMEN AND PELVIS WITH CONTRAST TECHNIQUE: Multidetector CT imaging of the abdomen and pelvis was performed using the standard protocol following bolus administration of intravenous contrast. CONTRAST:  175m OMNIPAQUE IOHEXOL 300 MG/ML  SOLN COMPARISON:  11/29/2018 FINDINGS: Lower chest: Clear lung bases. Normal heart size without pericardial or pleural effusion. Hepatobiliary: Normal liver. Normal gallbladder, without biliary ductal dilatation. Pancreas: Normal, without mass or ductal dilatation. Spleen: Normal in size, without focal abnormality. Adrenals/Urinary Tract: Normal adrenal glands. Normal kidneys, without hydronephrosis. Normal urinary bladder. Stomach/Bowel: Normal stomach, without wall thickening. Moderate to marked inflammation involves the distal and terminal ileum, slightly increased compared to 01/04/2019. Progressive  presumably secondary involvement of the cecum. Example 51/3. Fistulous are again identified between the terminal and distal ileum, including on this image. No surrounding abscess. The appendix is at least partially visualized including on 51/3 and uninvolved. No free intraperitoneal air. Small bowel loops just proximal to  the inflammation measure up to 2.9 cm. Apparent wall thickening of mid small bowel loops including on 44/3 may be due to underdistention. Vascular/Lymphatic: Aortic atherosclerosis. No abdominopelvic adenopathy. Reproductive: Normal prostate. Other: No significant free fluid. Musculoskeletal: No sacroiliitis. IMPRESSION: 1. Active inflammation/Crohn disease involving the distal/terminal ileum and adjacent cecum. Progressive compared to 01/04/2019. Areas of fistulous communication between small bowel loops again identified. No drainable abscess. 2. Small bowel just proximal to the inflammation measures up to 2.9 cm. Cannot exclude low-grade partial small-bowel obstruction. 3. Mid small bowel loops appear thick walled, but are underdistended. Equivocal for more proximal Crohn disease. 4.  Aortic Atherosclerosis (ICD10-I70.0). Electronically Signed   By: Abigail Miyamoto M.D.   On: 05/18/2019 09:45   DG Humerus Right  Result Date: 05/19/2019 CLINICAL DATA:  [address] off balcony EXAM: RIGHT HUMERUS - 2+ VIEW COMPARISON:  None. FINDINGS: There is no evidence of fracture or other focal bone lesions. Soft tissues are unremarkable. IMPRESSION: Negative. Electronically Signed   By: Donavan Foil M.D.   On: 05/19/2019 01:38    Impression/ Crohn's disease exacerbation.  Not currently on any outpatient therapy.  CT on 05/18/2019 showed active inflammation involving the distal/terminal ileum and adjacent cecum, progressed compared to 01/04/2019.  There were areas of fistulous communication between small bowel loops.  No drainable abscess.  Mild leukocytosis in the setting of Crohn's disease.  No abscess seen on  imaging.  Mild anemia, potentially dilutional due to IV fluids.  Hemoglobin today 12.8, decreased from 13.4 on arrival.  Concern for low-grade partial small bowel obstruction as seen on CT.  Patient currently NPO.  He last had a bowel movement yesterday.  He has mild nausea but no vomiting since admission.  GERD, chronic.  Previously on daily omeprazole.  Suicide attempt, psych following.  Plan: Initiate IV Solu-Medrol 40 mg twice daily.  Continue NPO status with IV fluids.  Transition to clear liquids if patient does not have progression of symptoms concerning for SBO.  Protonix p.o. daily for GERD.   Continue supportive care and pain control.  [facility] will follow.   LOS: 0 days   Salley Slaughter  PA-C 05/19/2019, 11:55 AM  Contact #  [phone]

## 2019-05-19 NOTE — BH Assessment (Signed)
Tele Assessment Note   Patient Name: Jeffery Mcintosh MRN: [id number] Referring Physician: Ward, Delice Bison, DO Location of Patient: [facility] Location of Provider: Behavioral Health TTS Department  Damacio Weisgerber is an 39 y.o. male who presents to the ED voluntarily. Pt reports he came to the ED "because my grandfather called the cops." TTS asked the pt why did his grandfather call the cops and he states "he wanted me out of his house." Pt is vague and apprehensive throughout the assessment and does not verbalize why he is in the ED. Pt states "if I tell you, you're just going to use it against me." Pt states he has an upcoming appointment with the [facility] today. Pt states he has been admitted to inpt facilities in the past for suicidal ideations. Pt states his mother died in 11-27-2018 and he feels this has increased his depression. Pt states he has his own place, however he has been staying with his grandfather ever since his mother died because he does not want to be alone. Pt states his mother has also called the cops on him in the past before she passed away and when asked why he states "same reason my grandfather did." Pt provides consent for TTS to speak with his grandfather. Pt's grandfather reports the pt has been suicidal and tried to jump off of a banister in his home. Pt reportedly tied a rope around his neck and tried to jump from the banister to hang himself but the rope was too long.  Pt is alert and oriented during the assessment. Pt states he has been awake for several days. Pt states he sometimes goes days without sleeping. Pt's attitude is apprehensive and demeanor is abrasive. Pt is not wearing a shirt during the assessment. Pt has appropriate eye contact and speech is logical and coherent. Pt denies HI and denies AVH at present.   Talbot Grumbling, NP recommends inpt tx. [facility] at capacity per [facility]. TTS to seek placement. EDP Ward, Delice Bison, DO and [place], [place], RN have been  advised.   Diagnosis: Bipolar affective d/o  Past Medical History:  Past Medical History:  Diagnosis Date  . Anxiety   . Arthritis   . Bipolar affective ([place])   . Chronic headaches   . Crohn's disease ([place]) history of  . Depression   . GERD (gastroesophageal reflux disease)   . Hypertension   . Noncompliance 04/17/2018  . PTSD (post-traumatic stress disorder) 04/17/2018  . Schizophrenic disorder [facility])     Past Surgical History:  Procedure Laterality Date  . NO PAST SURGERIES      Family History:  Family History  Problem Relation Age of Onset  . Hypertension Mother   . Heart attack Mother   . Stroke Mother   . Alcohol abuse Father   . Mental illness Father   . Multiple sclerosis Father   . Arthritis Maternal Grandmother   . Hypertension Maternal Grandmother   . Heart disease Maternal Grandmother   . Kidney disease Maternal Grandmother   . Diabetes Maternal Grandmother   . Cancer Maternal Grandmother        great, type unknown  . Hyperlipidemia Maternal Grandfather   . Colon polyps Maternal Grandfather   . Stroke Paternal Grandmother   . Ulcerative colitis Maternal Aunt     Social History:  reports that he has quit smoking. His smoking use included cigars. He has a 7.50 pack-year smoking history. He has never used smokeless tobacco. He reports that  he does not drink alcohol or use drugs.  Additional Social History:  Alcohol / Drug Use Pain Medications: See MAR Prescriptions: See MAR Over the Counter: See MAR History of alcohol / drug use?: No history of alcohol / drug abuse  CIWA: CIWA-Ar BP: (!) 150/89 Pulse Rate: 83 COWS:    Allergies: No Known Allergies  Home Medications: (Not in a hospital admission)   OB/GYN Status:  No LMP for male patient.  General Assessment Data Location of Assessment: [facility] TTS Assessment: In system Is this a Tele or Face-to-Face Assessment?: Tele Assessment Is this an Initial Assessment or a Re-assessment for this  encounter?: Initial Assessment Patient Accompanied by:: N/A Language Other than English: No Living Arrangements: Other (Comment) What gender do you identify as?: Male Marital status: Separated Pregnancy Status: No Living Arrangements: Other relatives Can pt return to current living arrangement?: Yes Admission Status: Involuntary Is patient capable of signing voluntary admission?: Yes Referral Source: Self/Family/Friend Insurance type: [facility]     Crisis Care Plan Living Arrangements: Other relatives Name of Psychiatrist: Ben Avon Name of Therapist: Guadalupe Guerra  Education Status Is patient currently in school?: No Is the patient employed, unemployed or receiving disability?: (unemployment)  Risk to self with the past 6 months Suicidal Ideation: Yes-Currently Present Has patient been a risk to self within the past 6 months prior to admission? : Yes Suicidal Intent: Yes-Currently Present Has patient had any suicidal intent within the past 6 months prior to admission? : Yes Is patient at risk for suicide?: Yes Suicidal Plan?: Yes-Currently Present Has patient had any suicidal plan within the past 6 months prior to admission? : Yes Specify Current Suicidal Plan: pt reportedly tried to hang himself Access to Means: Yes Specify Access to Suicidal Means: pt has access to rope What has been your use of drugs/alcohol within the last 12 months?: occasional alcohol  Previous Attempts/Gestures: Yes How many times?: 1 Other Self Harm Risks: depression, hx of inpt tx Triggers for Past Attempts: Other personal contacts, Unpredictable Intentional Self Injurious Behavior: None Family Suicide History: No Recent stressful life event(s): Loss (Comment), Other (Comment)(mother died) Persecutory voices/beliefs?: No Depression: Yes Depression Symptoms: Despondent, Feeling angry/irritable, Insomnia Substance abuse history and/or treatment for substance abuse?:  No Suicide prevention information given to non-admitted patients: Not applicable  Risk to Others within the past 6 months Homicidal Ideation: No Does patient have any lifetime risk of violence toward others beyond the six months prior to admission? : No Thoughts of Harm to Others: No Current Homicidal Intent: No Current Homicidal Plan: No Access to Homicidal Means: No History of harm to others?: No Assessment of Violence: None Noted Does patient have access to weapons?: No Criminal Charges Pending?: No Does patient have a court date: No Is patient on probation?: No  Psychosis Hallucinations: None noted Delusions: None noted  Mental Status Report Appearance/Hygiene: Revealing clothes/seductive clothing(no shirt) Eye Contact: Good Motor Activity: Freedom of movement Speech: Aggressive Level of Consciousness: Alert, Irritable Mood: Anxious, Angry, Depressed, Irritable Affect: Anxious, Angry, Depressed Anxiety Level: Severe Thought Processes: Relevant, Coherent Judgement: Impaired Orientation: Person, Place, Time, Situation, Appropriate for developmental age Obsessive Compulsive Thoughts/Behaviors: None  Cognitive Functioning Concentration: Normal Memory: Remote Intact, Recent Intact Is patient IDD: No Insight: Poor Impulse Control: Poor Appetite: Poor Have you had any weight changes? : Loss Amount of the weight change? (lbs): 50 lbs(within a year) Sleep: Decreased Total Hours of Sleep: 1 Vegetative Symptoms: None  ADLScreening [facility] Assessment Services) Patient's  cognitive ability adequate to safely complete daily activities?: Yes Patient able to express need for assistance with ADLs?: Yes Independently performs ADLs?: Yes (appropriate for developmental age)  Prior Inpatient Therapy Prior Inpatient Therapy: Yes Prior Therapy Dates: 2020 Prior Therapy Facilty/Provider(s): [place], [place] Reason for Treatment: SI  Prior Outpatient Therapy Prior Outpatient  Therapy: Yes Prior Therapy Dates: upcoming appt Prior Therapy Facilty/Provider(s): [place] Reason for Treatment: MH issues Does patient have an ACCT team?: No Does patient have Intensive In-House Services?  : No Does patient have [facility] services? : No Does patient have P4CC services?: No  ADL Screening (condition at time of admission) Patient's cognitive ability adequate to safely complete daily activities?: Yes Is the patient deaf or have difficulty hearing?: No Does the patient have difficulty seeing, even when wearing glasses/contacts?: No Does the patient have difficulty concentrating, remembering, or making decisions?: No Patient able to express need for assistance with ADLs?: Yes Does the patient have difficulty dressing or bathing?: No Independently performs ADLs?: Yes (appropriate for developmental age) Does the patient have difficulty walking or climbing stairs?: No Weakness of Legs: None Weakness of Arms/Hands: None  Home Assistive Devices/Equipment Home Assistive Devices/Equipment: None    Abuse/Neglect Assessment (Assessment to be complete while patient is alone) Abuse/Neglect Assessment Can Be Completed: Yes Physical Abuse: Denies Verbal Abuse: Denies Sexual Abuse: Denies Exploitation of patient/patient's resources: Denies Self-Neglect: Denies     Regulatory affairs officer (For Healthcare) Does Patient Have a Medical Advance Directive?: No Would patient like information on creating a medical advance directive?: No - Patient declined          Disposition:  Adaku Anike, NP recommends inpt tx. [facility] at capacity per [facility]. TTS to seek placement. EDP Ward, Delice Bison, DO and [place], [place], RN have been advised.  Disposition Initial Assessment Completed for this Encounter: Yes Disposition of Patient: Admit Type of inpatient treatment program: Adult Patient refused recommended treatment: No  This service was provided via telemedicine using a 2-way,  interactive audio and video technology.  Names of all persons participating in this telemedicine service and their role in this encounter. Name:  Carney Saxton Role: Patient  Name: Aura Fey Role: Grandfather  Name: Lind Covert, LCSW Role: TTS  Name:  Talbot Grumbling, NP Role: [facility] Provider    Lyanne Co 05/19/2019 4:29 AM

## 2019-05-19 NOTE — Progress Notes (Signed)
Pt being medically admitted per EDP

## 2019-05-19 NOTE — Progress Notes (Signed)
Initial Nutrition Assessment  DOCUMENTATION CODES:   Not applicable  INTERVENTION:  Education  Will provide [employer] supplement with diet advancement   NUTRITION DIAGNOSIS:   Inadequate oral intake related to acute illness(Crohn's colitis with fistula) as evidenced by NPO status.    GOAL:   Patient will meet greater than or equal to 90% of their needs    MONITOR:   Diet advancement, Labs, Weight trends, I & O's, Skin  REASON FOR ASSESSMENT:   Malnutrition Screening Tool    ASSESSMENT:   39 year old male with past medical history of HTN, schizophrenia, BPD, Crohn's disease who initially presented to ED with complaints of suicide attempt and having severe abdominal pain upon presentation. CT revealed active Crohn's disease with enteroenteric fistula and patient admitted on suicide precautions for Crohn's colitis with fistula.  Patient awake, alert, sitting up in bed, sitter in room this afternoon and reports feeling better today. He reports good appetite/intake at home, recalls eating a lot of cold cuts recently and likes making sub sandwiches (recalls [place], ham, salami, roast beef topped with submarine dressing, lettuce, and tomato on a variety of breads including sub rolls, bagels, and brown bread) He reports that he eats half and then will eat the other half later in the day. Patient reports that his grandfather usually makes dinner, stated that he is an excellent cook recalling filet mignon, tuna steaks, salmon, shrimp scampi, states that he also enjoys [place] cooking but it "hurts his stomach" Patient reports that he regularly drinks chamomile tea at night.   Patient reports Crohn's diagnosis at 44, endorses chronic flare up's about every 2-3 months. Patient expressed interest in nutrition education to prevent future flare ups. RD discussed following a low fiber diet with inflammation, eating small amounts throughout the day verses 3 large meals and gradually  increasing fiber as inflammation improves. RD will provide "IBD: Crohn's and UC Nutrition Therapy" as well as "5 Sample Menus for Gradually Increasing Fiber" handouts from the Academy of Nutrition and Dietetics.   Current wt 154 lbs. Pt recalls UBW 150-155 lbs, stated he weighed 206 lbs last year and endorses unintentional wt loss due to stress of going through a divorce and losing his mother last year. Weight history reviewed and has been stable over the past 7 months.  Medications reviewed and include: Folic acid, MVI, Adderall, Prozac, Prednisone, Thiamine IVF: NaCl Labs reviewed   NUTRITION - FOCUSED PHYSICAL EXAM: No fat or muscle depletions noted  Diet Order:   Diet Order            Diet NPO time specified Except for: Sips with Meds, Ice Chips  Diet effective now              EDUCATION NEEDS:   Education needs have been addressed  Skin:     Last BM:  4/13  Height:   Ht Readings from Last 1 Encounters:  05/19/19 5' 3"  (1.6 m)    Weight:   Wt Readings from Last 1 Encounters:  05/19/19 70 kg    Ideal Body Weight:  56.4 kg  BMI:  Body mass index is 27.34 kg/m.  Estimated Nutritional Needs:   Kcal:  1950-2200  Protein:  100-110  Fluid:  >/= 1.9 L/day   Lajuan Lines, RD, LDN Clinical Nutrition After Hours/Weekend Pager # in [place]

## 2019-05-19 NOTE — ED Triage Notes (Signed)
As per EMS pt was trying to hang self. No injuries. Cord was too long

## 2019-05-19 NOTE — H&P (Addendum)
History and Physical    Jeffery Mcintosh TSV:[id number] DOB: 1980-03-23 DOA: 05/19/2019  PCP: Patient, No Pcp Per  Patient coming from: Home  I have personally briefly reviewed patient's old medical records in [place]  Chief Complaint: Suicide attempt  HPI: Jeffery Mcintosh is a 39 y.o. male with medical history significant of HTN, schizophrenia, BPD, Crohn's disease.  Pt initially presents to ED with c/o suicide attempt that occurred earlier tonight.  See EDP note for details of that.  Medically speaking he has been having ABD pain from Crohn's flare for about 1.5 weeks now.  Nausea, NB diarrhea, bloating.  Seen in ED yesterday for Crohn's flare, CT revealed active Crohn's disease with enteroenteric fistulas.  SBO less likely but couldn't be excluded.  Put on steroids but didn't fill prednisone script today.   ED Course: While in ED today, having severe abd pain.  Made NPO and given morphine and solumedrol.  Hospitalist asked to admit.   Review of Systems: As per HPI, otherwise all review of systems negative.  Past Medical History:  Diagnosis Date  . Anxiety   . Arthritis   . Bipolar affective ([place])   . Chronic headaches   . Crohn's disease ([place]) history of  . Depression   . GERD (gastroesophageal reflux disease)   . Hypertension   . Noncompliance 04/17/2018  . PTSD (post-traumatic stress disorder) 04/17/2018  . Schizophrenic disorder [facility])     Past Surgical History:  Procedure Laterality Date  . NO PAST SURGERIES       reports that he has quit smoking. His smoking use included cigars. He has a 7.50 pack-year smoking history. He has never used smokeless tobacco. He reports that he does not drink alcohol or use drugs.  No Known Allergies  Family History  Problem Relation Age of Onset  . Hypertension Mother   . Heart attack Mother   . Stroke Mother   . Alcohol abuse Father   . Mental illness Father   . Multiple sclerosis Father   . Arthritis  Maternal Grandmother   . Hypertension Maternal Grandmother   . Heart disease Maternal Grandmother   . Kidney disease Maternal Grandmother   . Diabetes Maternal Grandmother   . Cancer Maternal Grandmother        great, type unknown  . Hyperlipidemia Maternal Grandfather   . Colon polyps Maternal Grandfather   . Stroke Paternal Grandmother   . Ulcerative colitis Maternal Aunt      Prior to Admission medications   Medication Sig Start Date End Date Taking? Authorizing Provider  amphetamine-dextroamphetamine (ADDERALL XR) 25 MG 24 hr capsule Take 25 mg by mouth every morning. 04/14/19  Yes [provider]  FLUoxetine (PROZAC) 10 MG capsule Take 20 mg by mouth daily. 05/10/19  Yes [provider]  Multiple Vitamin (MULTIVITAMIN WITH MINERALS) TABS tablet Take 1 tablet by mouth daily.   Yes [provider]  predniSONE (DELTASONE) 10 MG tablet Take 4 tablets (40 mg total) by mouth daily for 5 days. 05/18/19 05/23/19 Yes Tacy Learn, PA-C  omeprazole (PRILOSEC) 40 MG capsule Take 1 capsule (40 mg total) by mouth daily. Patient not taking: Reported on 01/18/2018 10/17/16 08/07/18  Nita Sells, MD  sucralfate (CARAFATE) 1 g tablet Take 1 tablet (1 g total) by mouth 4 (four) times daily -  with meals and at bedtime. Patient not taking: Reported on 04/17/2018 01/18/18 08/07/18  Varney Biles, MD    Physical Exam: Vitals:   05/19/19 0036 05/19/19  0100 05/19/19 0200 05/19/19 0300  BP: (!) 148/84 (!) 159/99 (!) 144/90 (!) 150/89  Pulse: 94 98 86 83  Resp: 18     Temp: 99.3 F (37.4 C)     TempSrc: Oral     SpO2: 97% 99% 94% 92%    Constitutional: NAD, calm, comfortable Eyes: PERRL, lids and conjunctivae normal ENMT: Mucous membranes are moist. Posterior pharynx clear of any exudate or lesions.Normal dentition.  Neck: normal, supple, no masses, no thyromegaly Respiratory: clear to auscultation bilaterally, no wheezing, no crackles. Normal respiratory effort.  No accessory muscle use.  Cardiovascular: Regular rate and rhythm, no murmurs / rubs / gallops. No extremity edema. 2+ pedal pulses. No carotid bruits.  Abdomen: Generalized TTP Musculoskeletal: no clubbing / cyanosis. No joint deformity upper and lower extremities. Good ROM, no contractures. Normal muscle tone.  Skin: no rashes, lesions, ulcers. No induration Neurologic: CN 2-12 grossly intact. Sensation intact, DTR normal. Strength 5/5 in all 4.  Psychiatric: Normal judgment and insight. Alert and oriented x 3. Normal mood.    Labs on Admission: I have personally reviewed following labs and imaging studies  CBC: Recent Labs  Lab 05/18/19 0138 05/19/19 0149  WBC 11.1* 9.2  NEUTROABS  --  6.6  HGB 13.4 14.0  HCT 39.7 41.7  MCV 88.6 89.9  PLT 457* 381*   Basic Metabolic Panel: Recent Labs  Lab 05/18/19 0138 05/19/19 0149  NA 144 148*  K 3.7 4.1  CL 106 107  CO2 24 28  GLUCOSE 138* 85  BUN 9 8  CREATININE 0.96 0.84  CALCIUM 9.2 9.1   GFR: Estimated Creatinine Clearance: 105.8 mL/min (by C-G formula based on SCr of 0.84 mg/dL). Liver Function Tests: Recent Labs  Lab 05/18/19 0138 05/19/19 0149  AST 18 23  ALT 18 20  ALKPHOS 98 90  BILITOT 0.6 0.3  PROT 6.3* 7.1  ALBUMIN 3.2* 3.7   No results for input(s): LIPASE, AMYLASE in the last 168 hours. No results for input(s): AMMONIA in the last 168 hours. Coagulation Profile: No results for input(s): INR, PROTIME in the last 168 hours. Cardiac Enzymes: No results for input(s): CKTOTAL, CKMB, CKMBINDEX, TROPONINI in the last 168 hours. BNP (last 3 results) No results for input(s): PROBNP in the last 8760 hours. HbA1C: No results for input(s): HGBA1C in the last 72 hours. CBG: No results for input(s): GLUCAP in the last 168 hours. Lipid Profile: No results for input(s): CHOL, HDL, LDLCALC, TRIG, CHOLHDL, LDLDIRECT in the last 72 hours. Thyroid Function Tests: No results for input(s): TSH, T4TOTAL, FREET4, T3FREE,  THYROIDAB in the last 72 hours. Anemia Panel: No results for input(s): VITAMINB12, FOLATE, FERRITIN, TIBC, IRON, RETICCTPCT in the last 72 hours. Urine analysis:    Component Value Date/Time   COLORURINE YELLOW 05/18/2019 0140   APPEARANCEUR CLEAR 05/18/2019 0140   LABSPEC 1.014 05/18/2019 0140   PHURINE 5.0 05/18/2019 0140   GLUCOSEU NEGATIVE 05/18/2019 0140   GLUCOSEU NEGATIVE 04/18/2013 1418   HGBUR NEGATIVE 05/18/2019 0140   BILIRUBINUR NEGATIVE 05/18/2019 0140   KETONESUR NEGATIVE 05/18/2019 0140   PROTEINUR NEGATIVE 05/18/2019 0140   UROBILINOGEN 1.0 11/16/2014 1719   NITRITE NEGATIVE 05/18/2019 0140   LEUKOCYTESUR NEGATIVE 05/18/2019 0140    Radiological Exams on Admission: CT Abdomen Pelvis W Contrast  Result Date: 05/18/2019 CLINICAL DATA:  Right lower quadrant pain for 2 weeks. History of Crohn disease. EXAM: CT ABDOMEN AND PELVIS WITH CONTRAST TECHNIQUE: Multidetector CT imaging of the abdomen and pelvis was performed  using the standard protocol following bolus administration of intravenous contrast. CONTRAST:  123m OMNIPAQUE IOHEXOL 300 MG/ML  SOLN COMPARISON:  11/29/2018 FINDINGS: Lower chest: Clear lung bases. Normal heart size without pericardial or pleural effusion. Hepatobiliary: Normal liver. Normal gallbladder, without biliary ductal dilatation. Pancreas: Normal, without mass or ductal dilatation. Spleen: Normal in size, without focal abnormality. Adrenals/Urinary Tract: Normal adrenal glands. Normal kidneys, without hydronephrosis. Normal urinary bladder. Stomach/Bowel: Normal stomach, without wall thickening. Moderate to marked inflammation involves the distal and terminal ileum, slightly increased compared to 01/04/2019. Progressive presumably secondary involvement of the cecum. Example 51/3. Fistulous are again identified between the terminal and distal ileum, including on this image. No surrounding abscess. The appendix is at least partially visualized including on  51/3 and uninvolved. No free intraperitoneal air. Small bowel loops just proximal to the inflammation measure up to 2.9 cm. Apparent wall thickening of mid small bowel loops including on 44/3 may be due to underdistention. Vascular/Lymphatic: Aortic atherosclerosis. No abdominopelvic adenopathy. Reproductive: Normal prostate. Other: No significant free fluid. Musculoskeletal: No sacroiliitis. IMPRESSION: 1. Active inflammation/Crohn disease involving the distal/terminal ileum and adjacent cecum. Progressive compared to 01/04/2019. Areas of fistulous communication between small bowel loops again identified. No drainable abscess. 2. Small bowel just proximal to the inflammation measures up to 2.9 cm. Cannot exclude low-grade partial small-bowel obstruction. 3. Mid small bowel loops appear thick walled, but are underdistended. Equivocal for more proximal Crohn disease. 4.  Aortic Atherosclerosis (ICD10-I70.0). Electronically Signed   By: KAbigail MiyamotoM.D.   On: 05/18/2019 09:45   DG Humerus Right  Result Date: 05/19/2019 CLINICAL DATA:  [place] balcony EXAM: RIGHT HUMERUS - 2+ VIEW COMPARISON:  None. FINDINGS: There is no evidence of fracture or other focal bone lesions. Soft tissues are unremarkable. IMPRESSION: Negative. Electronically Signed   By: KDonavan FoilM.D.   On: 05/19/2019 01:38    EKG: Independently reviewed.  Assessment/Plan Principal Problem:   Crohn's colitis, with fistula ([place] Active Problems:   Exacerbation of Crohn's disease ([place]   Bipolar 1 disorder ([place]   Suicidal ideation    1. Crohn's flare - 1. NPO 2. IVF: NS at 125 cc/hr 3. Got solumedrol in ED 4. Will resume PO prednisone that he didn't start later this morning. 5. Morphine PRN pain 6. No abscess as of yesterday CT 7. May wish to consider GI consult in AM 2. SI / BPD - 1. Per psych note: needs IP treatment 2. Presumably psych to follow while in hospital 3. Suicide precautions  DVT prophylaxis: SCDs Code  Status: Full Family Communication: No family in room Disposition Plan: Psych IP treatment when Chron's pain adequately controlled and taking POs Consults called: [place] in [place] Admission status: Place in o64   [place] Dittus, JKathrynHospitalists  How to contact the TKerr[facility] State HospitalAttending or Consulting provider [place] covering provider during after hours [place] for this patient?  1. Check the care team in [facility]and look for a) attending/consulting TRH provider listed and b) the TDay Surgery Center LLCteam listed 2. Log into [email or web address]  Amion Physician Scheduling and messaging for groups and whole hospitals  On call and physician scheduling software for group practices, residents, hospitalists and other medical providers for call, clinic, rotation and shift schedules. OnCall Enterprise is a hospital-wide system for scheduling doctors and paging doctors on call. EasyPlot is for scientific plotting and data analysis.  [email or web address]  and use [place]'s universal password to access. If you do not  have the password, please contact the hospital operator.  3. Locate the [facility] provider you are looking for under [facility] and page to a number that you can be directly reached. 4. If you still have difficulty reaching the provider, please page the [facility] (Director on Call) for the Hospitalists listed on amion for assistance.  05/19/2019, 5:20 AM

## 2019-05-19 NOTE — Progress Notes (Signed)
Received report from ED nurse.  Awaiting patient's arrival to Room 1617.  Patient will be on suicide precautions.

## 2019-05-19 NOTE — ED Provider Notes (Addendum)
TIME SEEN: 12:49 AM  CHIEF COMPLAINT: Suicide attempt  HPI: Patient is a 39 year old male with history of hypertension, schizophrenia, bipolar disorder, Crohn's disease, medical noncompliance who presents to the emergency department with suicide attempt that occurred tonight.  Patient reports that he has been feeling suicidal from some time.  He has had previous suicide attempts.  Tonight he reports he tried to hang himself.  His family called the police.  When police arrived he put a noose around his neck.  He states the police loosen the noose and then he jumped off of a second story balcony to the carpeted patio below.  He states he landed on his feet and did not hit his head.  He states he thinks he may have sprained his right shoulder somehow.  Pointing to the upper humerus as source of pain.  Denying any other pain.  Denies drug or alcohol use but states he did take a gabapentin today.  No HI.  No hallucinations.  Denies fevers, cough, nausea, vomiting, diarrhea.  No neck or back pain.  No headache.  No chest or abdominal pain.  No extremity pain other than pain in the right upper humerus.  Patient came in with police.  Police at bedside also providing history.  He has been placed under IVC by his family.  ROS: See HPI Constitutional: no fever  Eyes: no drainage  ENT: no runny nose   Cardiovascular:  no chest pain  Resp: no SOB  GI: no vomiting GU: no dysuria Integumentary: no rash  Allergy: no hives  Musculoskeletal: no leg swelling  Neurological: no slurred speech ROS otherwise negative  PAST MEDICAL HISTORY/PAST SURGICAL HISTORY:  Past Medical History:  Diagnosis Date  . Anxiety   . Arthritis   . Bipolar affective ([place])   . Chronic headaches   . Crohn's disease ([place]) history of  . Depression   . GERD (gastroesophageal reflux disease)   . Hypertension   . Noncompliance 04/17/2018  . PTSD (post-traumatic stress disorder) 04/17/2018  . Schizophrenic disorder ([place])      MEDICATIONS:  Prior to Admission medications   Medication Sig Start Date End Date Taking? Authorizing Provider  amphetamine-dextroamphetamine (ADDERALL XR) 25 MG 24 hr capsule Take 25 mg by mouth every morning. 04/14/19   [provider]  FLUoxetine (PROZAC) 10 MG capsule Take 20 mg by mouth daily. 05/10/19   [provider]  Multiple Vitamin (MULTIVITAMIN WITH MINERALS) TABS tablet Take 1 tablet by mouth daily.    [provider]  predniSONE (DELTASONE) 10 MG tablet Take 4 tablets (40 mg total) by mouth daily for 5 days. 05/18/19 05/23/19  Tacy Learn, PA-C  omeprazole (PRILOSEC) 40 MG capsule Take 1 capsule (40 mg total) by mouth daily. Patient not taking: Reported on 01/18/2018 10/17/16 08/07/18  Nita Sells, MD  sucralfate (CARAFATE) 1 g tablet Take 1 tablet (1 g total) by mouth 4 (four) times daily -  with meals and at bedtime. Patient not taking: Reported on 04/17/2018 01/18/18 08/07/18  Varney Biles, MD    ALLERGIES:  No Known Allergies  SOCIAL HISTORY:  Social History   Tobacco Use  . Smoking status: Former Smoker    Packs/day: 0.50    Years: 15.00    Pack years: 7.50    Types: Cigars  . Smokeless tobacco: Never Used  Substance Use Topics  . Alcohol use: No    Alcohol/week: 0.0 standard drinks    FAMILY HISTORY: Family History  Problem Relation Age of  Onset  . Hypertension Mother   . Heart attack Mother   . Stroke Mother   . Alcohol abuse Father   . Mental illness Father   . Multiple sclerosis Father   . Arthritis Maternal Grandmother   . Hypertension Maternal Grandmother   . Heart disease Maternal Grandmother   . Kidney disease Maternal Grandmother   . Diabetes Maternal Grandmother   . Cancer Maternal Grandmother        great, type unknown  . Hyperlipidemia Maternal Grandfather   . Colon polyps Maternal Grandfather   . Stroke Paternal Grandmother   . Ulcerative colitis Maternal Aunt     EXAM: BP (!) 148/84 (BP  Location: Left Arm)   Pulse 94   Temp 99.3 F (37.4 C) (Oral)   Resp 18   SpO2 97%  CONSTITUTIONAL: Alert and oriented and responds appropriately to questions. Well-appearing; well-nourished; GCS 15 HEAD: Normocephalic; atraumatic EYES: Conjunctivae clear, PERRL, EOMI ENT: normal nose; no rhinorrhea; moist mucous membranes; pharynx without lesions noted; no dental injury; no septal hematoma NECK: Supple, no meningismus, no LAD; no midline spinal tenderness, step-off or deformity; trachea midline CARD: RRR; S1 and S2 appreciated; no murmurs, no clicks, no rubs, no gallops RESP: Normal chest excursion without splinting or tachypnea; breath sounds clear and equal bilaterally; no wheezes, no rhonchi, no rales; no hypoxia or respiratory distress CHEST:  chest wall stable, no crepitus or ecchymosis or deformity, nontender to palpation; no flail chest ABD/GI: Normal bowel sounds; non-distended; soft, non-tender, no rebound, no guarding; no ecchymosis or other lesions noted PELVIS:  stable, nontender to palpation BACK:  The back appears normal and is non-tender to palpation, there is no CVA tenderness; no midline spinal tenderness, step-off or deformity EXT: Normal ROM in all joints; mild pain to the upper right humerus without deformity, no pain in the right shoulder joint, no edema; normal capillary refill; no cyanosis, no bony tenderness or bony deformity of patient's extremities, no joint effusion, compartments are soft, extremities are warm and well-perfused, no ecchymosis; no pain over the bilateral heels SKIN: Normal color for age and race; warm NEURO: Moves all extremities equally PSYCH: The patient's mood and manner are appropriate. Grooming and personal hygiene are appropriate.  MEDICAL DECISION MAKING: Patient here with suicide attempt.  I have completed the first evaluation as I agree with IVC.  Will obtain screening labs and urine as well as imaging of the right humerus.  Will provide with  ibuprofen.  Once medically cleared, will consult TTS.  ED PROGRESS: X-ray of the right humerus has been reviewed/interpreted and shows no acute abnormality.  Patient states he feels like he is about to lose control and "start throwing shit".  Will give oral Ativan.  Labs reviewed/interpreted.  No significant abnormality.  Alcohol level 16.  Sodium slightly elevated at 148.  Will encourage oral fluids.  Drug screen positive for opiates.  Covid negative.  Medically cleared.  Evaluated by TTS.  Talbot Grumbling, NP recommends inpt treatment.   The patient has been placed in psychiatric observation due to the need to provide a safe environment for the patient while obtaining psychiatric consultation and evaluation, as well as ongoing medical and medication management to treat the patient's condition.  The patient has been placed under full IVC at this time.  I reviewed all nursing notes and pertinent previous records as available.  I have reviewed and interpreted any EKGs, lab and urine results, imaging (as available).    Jeffery Mcintosh was evaluated in  Emergency Department on 05/19/2019 for the symptoms described in the history of present illness. He was evaluated in the context of the global COVID-19 pandemic, which necessitated consideration that the patient might be at risk for infection with the SARS-CoV-2 virus that causes COVID-19. Institutional protocols and algorithms that pertain to the evaluation of patients at risk for COVID-19 are in a state of rapid change based on information released by regulatory bodies including the CDC and federal and state organizations. These policies and algorithms were followed during the patient's care in the ED.    Lisette Mancebo, Delice Bison, DO 05/19/19 0429   4:50 AM  I was called to the room to reevaluate patient.  He is complaining of severe abdominal pain.  Has history of Crohn's disease.  Was seen yesterday at [facility] and had a CT of the abdomen pelvis  that shows active inflammation/Crohn's disease involving the distal/terminal ileum and adjacent cecum.  Areas of fistulous communication between the small bowel loops again identified.  No drainable abscess.  Patient also has small bowel just proximal to his inflammation that measures up to 2.9 cm and a low-grade partial small bowel obstruction could not be excluded.  His initial abdominal exam was benign but now he is on the floor hunched over the bed complaining of pain.  He has not had any vomiting here which makes me think a partial bowel obstruction is less likely but given this complaint of pain that he feels is worsened with his imaging yesterday that showed active Crohn's flare, I do not feel at this time he is medically cleared or appropriate for inpatient psychiatric treatment until his Crohn's flare has been managed and I feel this will now need to be done inpatient.  He had not yet started the prednisone that was prescribed yesterday stating that he never went to the pharmacy.  Will give 60 mg of IV Solu-Medrol here in provide with pain and nausea medicine as well as IV fluids.  We will keep him n.p.o.  He states he is not on medications chronically for his Crohn's disease and does not have GI follow-up scheduled until June 22.   5:09 AM Discussed patient's case with hospitalist, Dr. Alcario Drought.  I have recommended admission and patient (and family if present) agree with this plan. Admitting physician will place admission orders.   I reviewed all nursing notes, vitals, pertinent previous records and reviewed/interpreted all EKGs, lab and urine results, imaging (as available).      Kathrynn Backstrom, Delice Bison, DO 05/19/19 [phone]

## 2019-05-19 NOTE — ED Notes (Signed)
Pt placed in wine scrubs

## 2019-05-20 ENCOUNTER — Inpatient Hospital Stay (HOSPITAL_COMMUNITY): Payer: Medicare Other

## 2019-05-20 DIAGNOSIS — Z915 Personal history of self-harm: Secondary | ICD-10-CM | POA: Diagnosis not present

## 2019-05-20 DIAGNOSIS — K50813 Crohn's disease of both small and large intestine with fistula: Secondary | ICD-10-CM | POA: Diagnosis present

## 2019-05-20 DIAGNOSIS — I10 Essential (primary) hypertension: Secondary | ICD-10-CM | POA: Diagnosis present

## 2019-05-20 DIAGNOSIS — T380X5A Adverse effect of glucocorticoids and synthetic analogues, initial encounter: Secondary | ICD-10-CM | POA: Diagnosis present

## 2019-05-20 DIAGNOSIS — D638 Anemia in other chronic diseases classified elsewhere: Secondary | ICD-10-CM | POA: Diagnosis present

## 2019-05-20 DIAGNOSIS — R45851 Suicidal ideations: Secondary | ICD-10-CM | POA: Diagnosis present

## 2019-05-20 DIAGNOSIS — F4310 Post-traumatic stress disorder, unspecified: Secondary | ICD-10-CM | POA: Diagnosis present

## 2019-05-20 DIAGNOSIS — F209 Schizophrenia, unspecified: Secondary | ICD-10-CM | POA: Diagnosis present

## 2019-05-20 DIAGNOSIS — F909 Attention-deficit hyperactivity disorder, unspecified type: Secondary | ICD-10-CM | POA: Diagnosis present

## 2019-05-20 DIAGNOSIS — D72829 Elevated white blood cell count, unspecified: Secondary | ICD-10-CM | POA: Diagnosis present

## 2019-05-20 DIAGNOSIS — D6489 Other specified anemias: Secondary | ICD-10-CM | POA: Diagnosis present

## 2019-05-20 DIAGNOSIS — R739 Hyperglycemia, unspecified: Secondary | ICD-10-CM | POA: Diagnosis not present

## 2019-05-20 DIAGNOSIS — Z20822 Contact with and (suspected) exposure to covid-19: Secondary | ICD-10-CM | POA: Diagnosis present

## 2019-05-20 DIAGNOSIS — K5010 Crohn's disease of large intestine without complications: Secondary | ICD-10-CM | POA: Diagnosis present

## 2019-05-20 DIAGNOSIS — G8929 Other chronic pain: Secondary | ICD-10-CM | POA: Diagnosis present

## 2019-05-20 DIAGNOSIS — Z9119 Patient's noncompliance with other medical treatment and regimen: Secondary | ICD-10-CM | POA: Diagnosis not present

## 2019-05-20 DIAGNOSIS — Z7952 Long term (current) use of systemic steroids: Secondary | ICD-10-CM | POA: Diagnosis not present

## 2019-05-20 DIAGNOSIS — F332 Major depressive disorder, recurrent severe without psychotic features: Secondary | ICD-10-CM | POA: Diagnosis not present

## 2019-05-20 DIAGNOSIS — Z79899 Other long term (current) drug therapy: Secondary | ICD-10-CM | POA: Diagnosis not present

## 2019-05-20 DIAGNOSIS — Z87891 Personal history of nicotine dependence: Secondary | ICD-10-CM | POA: Diagnosis not present

## 2019-05-20 DIAGNOSIS — K219 Gastro-esophageal reflux disease without esophagitis: Secondary | ICD-10-CM | POA: Diagnosis present

## 2019-05-20 DIAGNOSIS — T1491XA Suicide attempt, initial encounter: Secondary | ICD-10-CM | POA: Diagnosis not present

## 2019-05-20 DIAGNOSIS — R109 Unspecified abdominal pain: Secondary | ICD-10-CM | POA: Diagnosis present

## 2019-05-20 DIAGNOSIS — K50113 Crohn's disease of large intestine with fistula: Secondary | ICD-10-CM | POA: Diagnosis not present

## 2019-05-20 LAB — CBC
HCT: 35.7 % — ABNORMAL LOW (ref 39.0–52.0)
Hemoglobin: 12.1 g/dL — ABNORMAL LOW (ref 13.0–17.0)
MCH: 30 pg (ref 26.0–34.0)
MCHC: 33.9 g/dL (ref 30.0–36.0)
MCV: 88.4 fL (ref 80.0–100.0)
Platelets: 409 10*3/uL — ABNORMAL HIGH (ref 150–400)
RBC: 4.04 MIL/uL — ABNORMAL LOW (ref 4.22–5.81)
RDW: 14 % (ref 11.5–15.5)
WBC: 14.6 10*3/uL — ABNORMAL HIGH (ref 4.0–10.5)
nRBC: 0 % (ref 0.0–0.2)

## 2019-05-20 LAB — BASIC METABOLIC PANEL
Anion gap: 8 (ref 5–15)
BUN: 9 mg/dL (ref 6–20)
CO2: 25 mmol/L (ref 22–32)
Calcium: 8.6 mg/dL — ABNORMAL LOW (ref 8.9–10.3)
Chloride: 108 mmol/L (ref 98–111)
Creatinine, Ser: 0.73 mg/dL (ref 0.61–1.24)
GFR calc Af Amer: >60 mL/min (ref >60)
GFR calc non Af Amer: >60 mL/min (ref >60)
Glucose, Bld: 137 mg/dL — ABNORMAL HIGH (ref 70–99)
Potassium: 4.2 mmol/L (ref 3.5–5.1)
Sodium: 141 mmol/L (ref 135–145)

## 2019-05-20 MED ORDER — HYDROMORPHONE HCL 1 MG/ML IJ SOLN
0.5000 mg | INTRAMUSCULAR | Status: DC | PRN
  Administered 2019-05-20 – 2019-05-21 (×6): 0.5 mg via INTRAVENOUS
  Filled 2019-05-20 (×6): qty 0.5

## 2019-05-20 MED ORDER — BOOST / RESOURCE BREEZE PO LIQD CUSTOM
1.0000 | Freq: Three times a day (TID) | ORAL | Status: DC
  Administered 2019-05-20 – 2019-05-25 (×14): 1 via ORAL

## 2019-05-20 NOTE — Progress Notes (Addendum)
[facility] Gastroenterology Progress Note  Jeffery Mcintosh 39 y.o. [birth year]  CC: Abdominal pain, Crohn's disease flare (fistulizing disease), constipation  Subjective: Patient reports he has persistent abdominal pain, which he currently rates as an 8 to 9 out of 10.  The pain is located in the upper abdomen.  He also endorses some nausea but denies any vomiting.  He has not had any bowel movements or rectal bleeding since admission.  ROS : Review of Systems  Constitutional: Negative for chills and fever.  Respiratory: Negative for shortness of breath.   Cardiovascular: Negative for chest pain.  Gastrointestinal: Positive for abdominal pain, constipation and nausea. Negative for blood in stool, diarrhea, heartburn, melena and vomiting.   Objective: Vital signs in last 24 hours: Vitals:   05/19/19 2322 05/20/19 0514  BP: 139/80 133/69  Pulse: 85 78  Resp: 18 16  Temp: 98.1 F (36.7 C) 97.6 F (36.4 C)  SpO2: 98% 96%    Physical Exam:  Physical Exam  Constitutional: He is oriented to person, place, and time. He appears well-developed and well-nourished. He appears lethargic. No distress.  Eyes: Conjunctivae and EOM are normal.  Abdominal: Soft. He exhibits distension (mild, upper abdominal). He exhibits no mass. There is abdominal tenderness (upper abdominal). There is no rebound and no guarding.  Musculoskeletal:        General: No deformity or edema.  Neurological: He is oriented to person, place, and time. He appears lethargic.  Skin: Skin is warm and dry.    Lab Results: Recent Labs    05/19/19 0833 05/20/19 0508  NA 142 141  K 4.3 4.2  CL 106 108  CO2 27 25  GLUCOSE 114* 137*  BUN 12 9  CREATININE 0.78 0.73  CALCIUM 8.4* 8.6*  MG 2.0  --   PHOS 3.1  --    Recent Labs    05/19/19 0149 05/19/19 0833  AST 23 31  ALT 20 24  ALKPHOS 90 87  BILITOT 0.3 0.3  PROT 7.1 6.4*  ALBUMIN 3.7 3.4*   Recent Labs    05/19/19 0149 05/19/19 0149 05/19/19 0833  05/20/19 0508  WBC 9.2   < > 13.9* 14.6*  NEUTROABS 6.6  --   --   --   HGB 14.0   < > 12.8* 12.1*  HCT 41.7   < > 37.9* 35.7*  MCV 89.9   < > 90.0 88.4  PLT 467*   < > 466* 409*   < > = values in this interval not displayed.   No results for input(s): LABPROT, INR in the last 72 hours.  Impression: Crohn's disease exacerbation.  Was not on any outpatient therapy at time of admission.  CT on 05/18/2019 showed active inflammation involving the distal/terminal ileum and adjacent cecum, progressed compared to 01/04/2019.  There were areas of fistulous communication between small bowel loops.  No drainable abscess.  Concern for low-grade partial small bowel obstruction as seen on CT 4/14.  Patient currently NPO.  He has not had a bowel movement since admission.  He has been having persistent upper abdominal pain and also has nausea but no vomiting.  Mild leukocytosis in the setting of Crohn's disease.  No abscess seen on imaging.  WBCs increased to 14.6 from 13.9 yesterday.  Likely combination of inflammation secondary to Crohn's disease and initiation of IV Solu-Medrol.  Mild anemia, potentially dilutional due to IV fluids.  Hemoglobin today 12.1, decreased from 12.8 yesterday and 13.4 on arrival.  GERD, chronic.  Previously on daily omeprazole as an outpatient.  Suicide attempt, psych following.  Plan: Continue IV Solu-Medrol 40 mg twice daily.  Continue NPO status with IV fluids.    Plain abdominal film ordered to reassess care SBO due to persistent abdominal pain and constipation.  Continue Protonix p.o. daily for GERD.   Continue supportive care and pain control.  [facility] will follow. No sign of bowel obstruction on abdomen x ray. SG   [employer] PA-C 05/20/2019, 11:29 AM  Contact #  [phone]

## 2019-05-20 NOTE — Progress Notes (Signed)
PROGRESS NOTE    Jeffery Mcintosh    Code Status: Full Code  NID:[id number] DOB: [birth year] DOA: 05/19/2019 LOS: 0 days  PCP: Patient, No Pcp Per CC:  Chief Complaint  Patient presents with  . Suicide Attempt       Hospital Summary   This is a 39 year old male with past medical history of depression and bipolar disorder with multiple suicide attempts, Crohn's disease, GERD who was admitted on 4/15 after he had a suicide attempt at home and found to be in Crohn's flare on admission.  Both psych and GI have been consulted.   A & P   Principal Problem:   Crohn's colitis, with fistula ([place]) Active Problems:   Exacerbation of Crohn's disease ([place])   Bipolar 1 disorder ([place])   Suicidal ideation   Crohn's colitis ([place])   1. Crohn's disease exacerbation a. Was not taking his outpatient medications at time of admission b. CT on 4/14 showing active inflammation involving distal/terminal ileum and adjacent cecum with areas of fistulous between small bowel loops and no drainable abscess which has progressed since 01/04/2019 scan. c. GI on board recommending continued Solu-Medrol twice daily, n.p.o. status with IV fluids d. will increase frequency of Dilaudid to every 2 hours as needed  2. Concern for low-grade partial SBO a. Noted on CT scan on admission b. Still with significant abdominal pain c. Abdominal radiograph ordered: Mild stool burden with no evidence of bowel obstruction or ileus  3. Depression with suicide attempt with history of prior attempts a. Psychiatry recommending inpatient admission once patient is medically cleared from a Crohn's standpoint.  Will reach back out to psych once he is cleared b. Continue Prozac c. TOC team consulted  4. GERD a. Previously on omeprazole as an outpatient b. Continue Protonix p.o.  5. Normocytic anemia a. Likely anemia of chronic disease in setting of Crohn's flare b. Continue to monitor with no need for transfusion at this  time  6. Reactive leukocytosis secondary to Crohn's exacerbation  7. ADHD On Adderall  DVT prophylaxis: Lovenox Family Communication: Patient updated at bedside Disposition Plan: Plan to transfer to inpatient psychiatric facility once he is medically cleared hopefully likely stable in the next 24 to 48 hours.  He will need to be off IV pain medications as well as tolerating p.o. intake Status is: Inpatient  Remains inpatient appropriate because:Ongoing active pain requiring inpatient pain management and IV treatments appropriate due to intensity of illness or inability to take PO   Dispo: The patient is from: Home              Anticipated d/c is to:[facility] or other psychiatric facility              Anticipated d/c date is: 2 days              Patient currently is not medically stable to d/c.   Pressure injury documentation    None  Consultants  GI   Procedures  None  Antibiotics   Anti-infectives (From admission, onward)   None        Subjective   Reports Dilaudid is only lasting about 15 minutes or so and has significant pain 8/10 on scale.  Denies any nausea or vomiting diarrhea or constipation.  No other symptoms.  No other overnight events  Objective   Vitals:   05/19/19 1426 05/19/19 2322 05/20/19 0514 05/20/19 1219  BP: (!) 155/72 139/80 133/69 133/85  Pulse: 81 85 78 74  Resp:  16 18 16 16   Temp: (!) 97.3 F (36.3 C) 98.1 F (36.7 C) 97.6 F (36.4 C) 97.6 F (36.4 C)  TempSrc: Oral Oral Oral Oral  SpO2: 97% 98% 96% 98%  Weight:      Height:        Intake/Output Summary (Last 24 hours) at 05/20/2019 1658 Last data filed at 05/20/2019 1500 Gross per 24 hour  Intake 4429.89 ml  Output --  Net 4429.89 ml   Filed Weights   05/19/19 1024  Weight: 70 kg    Examination:  Physical Exam Vitals and nursing note reviewed.  Constitutional:      Appearance: Normal appearance.  HENT:     Head: Normocephalic and atraumatic.  Eyes:      Conjunctiva/sclera: Conjunctivae normal.  Cardiovascular:     Rate and Rhythm: Normal rate and regular rhythm.  Pulmonary:     Effort: Pulmonary effort is normal.     Breath sounds: Normal breath sounds.  Abdominal:     Palpations: Abdomen is soft.     Tenderness: There is abdominal tenderness.  Musculoskeletal:        General: No swelling or tenderness.  Skin:    Coloration: Skin is not jaundiced or pale.  Neurological:     Mental Status: He is alert. Mental status is at baseline.  Psychiatric:        Mood and Affect: Mood normal.        Behavior: Behavior normal.     Data Reviewed: I have personally reviewed following labs and imaging studies  CBC: Recent Labs  Lab 05/18/19 0138 05/19/19 0149 05/19/19 0833 05/20/19 0508  WBC 11.1* 9.2 13.9* 14.6*  NEUTROABS  --  6.6  --   --   HGB 13.4 14.0 12.8* 12.1*  HCT 39.7 41.7 37.9* 35.7*  MCV 88.6 89.9 90.0 88.4  PLT 457* 467* 466* 573*   Basic Metabolic Panel: Recent Labs  Lab 05/18/19 0138 05/19/19 0149 05/19/19 0833 05/20/19 0508  NA 144 148* 142 141  K 3.7 4.1 4.3 4.2  CL 106 107 106 108  CO2 24 28 27 25   GLUCOSE 138* 85 114* 137*  BUN 9 8 12 9   CREATININE 0.96 0.84 0.78 0.73  CALCIUM 9.2 9.1 8.4* 8.6*  MG  --   --  2.0  --   PHOS  --   --  3.1  --    GFR: Estimated Creatinine Clearance: 111 mL/min (by C-G formula based on SCr of 0.73 mg/dL). Liver Function Tests: Recent Labs  Lab 05/18/19 0138 05/19/19 0149 05/19/19 0833  AST 18 23 31   ALT 18 20 24   ALKPHOS 98 90 87  BILITOT 0.6 0.3 0.3  PROT 6.3* 7.1 6.4*  ALBUMIN 3.2* 3.7 3.4*   No results for input(s): LIPASE, AMYLASE in the last 168 hours. No results for input(s): AMMONIA in the last 168 hours. Coagulation Profile: No results for input(s): INR, PROTIME in the last 168 hours. Cardiac Enzymes: No results for input(s): CKTOTAL, CKMB, CKMBINDEX, TROPONINI in the last 168 hours. BNP (last 3 results) No results for input(s): PROBNP in the last  8760 hours. HbA1C: No results for input(s): HGBA1C in the last 72 hours. CBG: No results for input(s): GLUCAP in the last 168 hours. Lipid Profile: No results for input(s): CHOL, HDL, LDLCALC, TRIG, CHOLHDL, LDLDIRECT in the last 72 hours. Thyroid Function Tests: No results for input(s): TSH, T4TOTAL, FREET4, T3FREE, THYROIDAB in the last 72 hours. Anemia Panel: No results for input(s):  VITAMINB12, FOLATE, FERRITIN, TIBC, IRON, RETICCTPCT in the last 72 hours. Sepsis Labs: No results for input(s): PROCALCITON, LATICACIDVEN in the last 168 hours.  Recent Results (from the past 240 hour(s))  Respiratory Panel by RT PCR (Flu A&B, Covid) - Nasopharyngeal Swab     Status: None   Collection Time: 05/19/19  1:48 AM   Specimen: Nasopharyngeal Swab  Result Value Ref Range Status   SARS Coronavirus 2 by RT PCR NEGATIVE NEGATIVE Final    Comment: (NOTE) SARS-CoV-2 target nucleic acids are NOT DETECTED. The SARS-CoV-2 RNA is generally detectable in upper respiratoy specimens during the acute phase of infection. The lowest concentration of SARS-CoV-2 viral copies this assay can detect is 131 copies/mL. A negative result does not preclude SARS-Cov-2 infection and should not be used as the sole basis for treatment or other patient management decisions. A negative result may occur with  improper specimen collection/handling, submission of specimen other than nasopharyngeal swab, presence of viral mutation(s) within the areas targeted by this assay, and inadequate number of viral copies (<131 copies/mL). A negative result must be combined with clinical observations, patient history, and epidemiological information. The expected result is Negative. Fact Sheet for Patients:  PinkCheek.be Fact Sheet for Healthcare Providers:  GravelBags.it This test is not yet ap proved or cleared by the [place] FDA and  has been authorized for  detection and/or diagnosis of SARS-CoV-2 by FDA under an Emergency Use Authorization (EUA). This EUA will remain  in effect (meaning this test can be used) for the duration of the COVID-19 declaration under Section 564(b)(1) of the Act, 21 U.S.C. section 360bbb-3(b)(1), unless the authorization is terminated or revoked sooner.    Influenza A by PCR NEGATIVE NEGATIVE Final   Influenza B by PCR NEGATIVE NEGATIVE Final    Comment: (NOTE) The Xpert Xpress SARS-CoV-2/FLU/RSV assay is intended as an aid in  the diagnosis of influenza from Nasopharyngeal swab specimens and  should not be used as a sole basis for treatment. Nasal washings and  aspirates are unacceptable for Xpert Xpress SARS-CoV-2/FLU/RSV  testing. Fact Sheet for Patients: PinkCheek.be Fact Sheet for Healthcare Providers: GravelBags.it This test is not yet approved or cleared by the [place] FDA and  has been authorized for detection and/or diagnosis of SARS-CoV-2 by  FDA under an Emergency Use Authorization (EUA). This EUA will remain  in effect (meaning this test can be used) for the duration of the  Covid-19 declaration under Section 564(b)(1) of the Act, 21  U.S.C. section 360bbb-3(b)(1), unless the authorization is  terminated or revoked. Performed at [facility], [place] [address], [place], [place] [address]          Radiology Studies: DG Abd 1 View  Result Date: 05/20/2019 CLINICAL DATA:  Abdominal pain and distention. EXAM: ABDOMEN - 1 VIEW COMPARISON:  April 20, 2018. FINDINGS: No abnormal bowel dilatation is noted. Mild amount of stool seen throughout the colon. No radio-opaque calculi or other significant radiographic abnormality are seen. IMPRESSION: Mild stool burden. No evidence of bowel obstruction or ileus. Electronically Signed   By: Marijo Conception M.D.   On: 05/20/2019 14:48   DG Humerus Right  Result Date:  05/19/2019 CLINICAL DATA:  [address] off balcony EXAM: RIGHT HUMERUS - 2+ VIEW COMPARISON:  None. FINDINGS: There is no evidence of fracture or other focal bone lesions. Soft tissues are unremarkable. IMPRESSION: Negative. Electronically Signed   By: Donavan Foil M.D.   On: 05/19/2019 01:38  Scheduled Meds: . amphetamine-dextroamphetamine  25 mg Oral q morning - 10a  . feeding supplement  1 Container Oral TID BM  . FLUoxetine  20 mg Oral Daily  . folic acid  1 mg Oral Daily  . methylPREDNISolone (SOLU-MEDROL) injection  40 mg Intravenous Q12H  . multivitamin with minerals  1 tablet Oral Daily  . pantoprazole  40 mg Oral Daily  . thiamine  100 mg Oral Daily   Or  . thiamine  100 mg Intravenous Daily   Continuous Infusions: . sodium chloride 125 mL/hr at 05/20/19 0538     Time spent: 30 minutes with over 50% of the time coordinating the patient's care    Harold Hedge, DO [facility] Pager [phone]  Call night coverage person covering after 7pm

## 2019-05-20 NOTE — Progress Notes (Signed)
Patient ID: Jeffery Mcintosh, male   DOB: [birth year], 39 y.o.   MRN: [id number] Patient assessed by Psychiatry team day of admission to medical floor. At that time it was determined that patient meets inpatient criteria. Discussed with Neysa Bonito, who will re-enter psych consult once patient is medically cleared and we will begin to seek inpatient treatment at that time.

## 2019-05-20 NOTE — Progress Notes (Signed)
ANTICOAGULATION CONSULT NOTE  Pharmacy Consult for Lovenox Indication: VTE prophylaxis  No Known Allergies  Patient Measurements: Height: 5' 3"  (160 cm) Weight: 70 kg (154 lb 5.2 oz) IBW/kg (Calculated) : 56.9  Vital Signs: Temp: 97.6 F (36.4 C) (04/16 1219) Temp Source: Oral (04/16 1219) BP: 133/85 (04/16 1219) Pulse Rate: 74 (04/16 1219)  Labs: Recent Labs    05/19/19 0149 05/19/19 0149 05/19/19 0833 05/20/19 0508  HGB 14.0   < > 12.8* 12.1*  HCT 41.7  --  37.9* 35.7*  PLT 467*  --  466* 409*  CREATININE 0.84  --  0.78 0.73   < > = values in this interval not displayed.    Estimated Creatinine Clearance: 111 mL/min (by C-G formula based on SCr of 0.73 mg/dL).   Medical History: Past Medical History:  Diagnosis Date  . Anxiety   . Arthritis   . Bipolar affective ([place])   . Chronic headaches   . Crohn's disease ([place]) history of  . Depression   . GERD (gastroesophageal reflux disease)   . Hypertension   . Noncompliance 04/17/2018  . PTSD (post-traumatic stress disorder) 04/17/2018  . Schizophrenic disorder ([place])     Medications:  Medications Prior to Admission  Medication Sig Dispense Refill Last Dose  . amphetamine-dextroamphetamine (ADDERALL XR) 25 MG 24 hr capsule Take 25 mg by mouth every morning.   Past Week at Unknown time  . FLUoxetine (PROZAC) 10 MG capsule Take 20 mg by mouth daily.   Past Week at Unknown time  . Multiple Vitamin (MULTIVITAMIN WITH MINERALS) TABS tablet Take 1 tablet by mouth daily.   Past Week at Unknown time  . predniSONE (DELTASONE) 10 MG tablet Take 4 tablets (40 mg total) by mouth daily for 5 days. 20 tablet 0 not started   Scheduled:  . amphetamine-dextroamphetamine  25 mg Oral q morning - 10a  . feeding supplement  1 Container Oral TID BM  . FLUoxetine  20 mg Oral Daily  . folic acid  1 mg Oral Daily  . methylPREDNISolone (SOLU-MEDROL) injection  40 mg Intravenous Q12H  . multivitamin with minerals  1 tablet Oral Daily   . pantoprazole  40 mg Oral Daily  . thiamine  100 mg Oral Daily   Or  . thiamine  100 mg Intravenous Daily   Infusions:  . sodium chloride 125 mL/hr at 05/20/19 3888     Assessment: 39 y.o. male admitted 05/19/2019 for SI and Crohn's flare.   CBC: Hgb slightly low but stable; Plt borderline elevated  SCr: stable WNL; baseline  Previous anticoagulation: none   Goal of Therapy: Prevention of VTE  Plan:  Lovenox 40 mg SQ q24 hr   Reuel Boom, PharmD, BCPS [phone] 05/20/2019, 5:19 PM

## 2019-05-21 LAB — CBC
HCT: 37.2 % — ABNORMAL LOW (ref 39.0–52.0)
Hemoglobin: 12.4 g/dL — ABNORMAL LOW (ref 13.0–17.0)
MCH: 30.2 pg (ref 26.0–34.0)
MCHC: 33.3 g/dL (ref 30.0–36.0)
MCV: 90.5 fL (ref 80.0–100.0)
Platelets: 441 10*3/uL — ABNORMAL HIGH (ref 150–400)
RBC: 4.11 MIL/uL — ABNORMAL LOW (ref 4.22–5.81)
RDW: 14.4 % (ref 11.5–15.5)
WBC: 12.6 10*3/uL — ABNORMAL HIGH (ref 4.0–10.5)
nRBC: 0 % (ref 0.0–0.2)

## 2019-05-21 LAB — BASIC METABOLIC PANEL
Anion gap: 8 (ref 5–15)
BUN: 9 mg/dL (ref 6–20)
CO2: 24 mmol/L (ref 22–32)
Calcium: 8.6 mg/dL — ABNORMAL LOW (ref 8.9–10.3)
Chloride: 107 mmol/L (ref 98–111)
Creatinine, Ser: 0.7 mg/dL (ref 0.61–1.24)
GFR calc Af Amer: >60 mL/min (ref >60)
GFR calc non Af Amer: >60 mL/min (ref >60)
Glucose, Bld: 207 mg/dL — ABNORMAL HIGH (ref 70–99)
Potassium: 4.3 mmol/L (ref 3.5–5.1)
Sodium: 139 mmol/L (ref 135–145)

## 2019-05-21 MED ORDER — ENOXAPARIN SODIUM 40 MG/0.4ML ~~LOC~~ SOLN
40.0000 mg | Freq: Every day | SUBCUTANEOUS | Status: DC
  Administered 2019-05-21 – 2019-05-25 (×5): 40 mg via SUBCUTANEOUS
  Filled 2019-05-21 (×5): qty 0.4

## 2019-05-21 MED ORDER — HYDROMORPHONE HCL 1 MG/ML IJ SOLN
0.5000 mg | INTRAMUSCULAR | Status: DC | PRN
  Administered 2019-05-21 – 2019-05-22 (×6): 0.5 mg via INTRAVENOUS
  Filled 2019-05-21 (×6): qty 0.5

## 2019-05-21 MED ORDER — ALUM & MAG HYDROXIDE-SIMETH 200-200-20 MG/5ML PO SUSP
30.0000 mL | Freq: Once | ORAL | Status: AC
  Administered 2019-05-21: 22:00:00 30 mL via ORAL
  Filled 2019-05-21: qty 30

## 2019-05-21 MED ORDER — ACETAMINOPHEN 325 MG PO TABS: 650.0000 mg | ORAL_TABLET | Freq: Four times a day (QID) | ORAL | Status: DC | PRN

## 2019-05-21 MED ORDER — HYDROCODONE-ACETAMINOPHEN 7.5-325 MG PO TABS
1.0000 | ORAL_TABLET | Freq: Four times a day (QID) | ORAL | Status: DC | PRN
  Administered 2019-05-21 – 2019-05-23 (×6): 1 via ORAL
  Filled 2019-05-21 (×6): qty 1

## 2019-05-21 NOTE — Progress Notes (Signed)
[facility] Gastroenterology Progress Note  Jeffery Mcintosh 39 y.o. 1980-04-08   Subjective: Sitting up in bed laughing with the sitter. Reports abd pain is a little better than when he came in to the hospital when it was 9/10. Currently 7/10 in intensity and pain is usually 4/10. Reports passing gas and liquid but no stool form.  Objective: Vital signs: Vitals:   05/21/19 0559 05/21/19 1311  BP: (!) 142/81 136/85  Pulse: 77 72  Resp: 15 18  Temp: 98.1 F (36.7 C) 97.9 F (36.6 C)  SpO2: 98% 99%    Physical Exam: Gen: alert, no acute distress  HEENT: anicteric sclera CV: RRR Chest: CTA B Abd: diffuse tenderness with minimal guarding, soft, nondistended, +BS Ext: no edema  Lab Results: Recent Labs    05/19/19 0833 05/19/19 0833 05/20/19 0508 05/21/19 0520  NA 142   < > 141 139  K 4.3   < > 4.2 4.3  CL 106   < > 108 107  CO2 27   < > 25 24  GLUCOSE 114*   < > 137* 207*  BUN 12   < > 9 9  CREATININE 0.78   < > 0.73 0.70  CALCIUM 8.4*   < > 8.6* 8.6*  MG 2.0  --   --   --   PHOS 3.1  --   --   --    < > = values in this interval not displayed.   Recent Labs    05/19/19 0149 05/19/19 0833  AST 23 31  ALT 20 24  ALKPHOS 90 87  BILITOT 0.3 0.3  PROT 7.1 6.4*  ALBUMIN 3.7 3.4*   Recent Labs    05/19/19 0149 05/19/19 0833 05/20/19 0508 05/21/19 0520  WBC 9.2   < > 14.6* 12.6*  NEUTROABS 6.6  --   --   --   HGB 14.0   < > 12.1* 12.4*  HCT 41.7   < > 35.7* 37.2*  MCV 89.9   < > 88.4 90.5  PLT 467*   < > 409* 441*   < > = values in this interval not displayed.      Assessment/Plan: Crohn's flare with fistulizing disease. Chart says he was not on any meds but he says he was taking Budesonide. Continue aggressive IV steroids. Liquid diet. Supportive care. Will go to inpt psych when medically stable. Needs to keep planned f/u with [facility].   Jeffery Mcintosh 05/21/2019, 3:57 PM  Questions please call [phone] ID: Jeffery Mcintosh, male    DOB: 1980-12-28, 39 y.o.   MRN: [id number]

## 2019-05-21 NOTE — Progress Notes (Signed)
PROGRESS NOTE    Jeffery Mcintosh    Code Status: Full Code  JJK:[id number] DOB: 1980-09-22 DOA: 05/19/2019 LOS: 1 days  PCP: Patient, No Pcp Per CC:  Chief Complaint  Patient presents with  . Suicide Attempt       Hospital Summary   This is a 39 year old male with past medical history of depression and bipolar disorder with multiple suicide attempts, Crohn's disease, GERD who was admitted on 4/15 after he had a suicide attempt at home and found to be in Crohn's flare on admission.  Both psych and GI have been consulted.   A & P   Principal Problem:   Crohn's colitis, with fistula ([place]) Active Problems:   Exacerbation of Crohn's disease ([place])   Bipolar 1 disorder ([place])   Suicidal ideation   Crohn's colitis ([place])   1. Crohn's disease exacerbation a. Was not taking his outpatient medications at time of admission b. CT on 4/14 showing active inflammation involving distal/terminal ileum and adjacent cecum with areas of fistulous between small bowel loops and no drainable abscess which has progressed since 01/04/2019 scan. c. GI on board recommending  d. Continue Solu-Medrol twice daily e. Advance diet from CLD to FLD f. Will decrease frequency of dilaudid as patient states he is feeling a bit better  2. Concern for low-grade partial SBO resolved a. Noted on CT scan on admission b. Abdominal XR: Mild stool burden with no evidence of bowel obstruction or ileus c. Now having BMs  3. Depression with suicide attempt with history of prior attempts a. Psychiatry recommending inpatient admission once patient is medically cleared from a Crohn's standpoint.  Will reach back out to psych once he is cleared b. Continue Prozac c. TOC team consulted  4. GERD a. Previously on omeprazole as an outpatient b. Continue Protonix p.o.  5. Normocytic anemia a. Likely anemia of chronic disease in setting of Crohn's flare b. Continue to monitor with no need for transfusion at this  time  6. Reactive leukocytosis secondary to Crohn's exacerbation  7. ADHD On Adderall  DVT prophylaxis: Lovenox Family Communication: Patient updated at bedside Disposition Plan: Plan to transfer to inpatient psychiatric facility once he is medically cleared hopefully stable in the next 24 to 48 hours.  He will need to be off IV pain medications as well as tolerating p.o. intake Status is: Inpatient  Remains inpatient appropriate because:Ongoing active pain requiring inpatient pain management and IV treatments appropriate due to intensity of illness or inability to take PO   Dispo: The patient is from: Home              Anticipated d/c is to:[facility] or other psychiatric facility              Anticipated d/c date is: 2 days              Patient currently is not medically stable to d/c.   Pressure injury documentation    None  Consultants  GI  Psych  Procedures  None  Antibiotics   Anti-infectives (From admission, onward)   None        Subjective   Reports he is tolerating CLD and asking to advance his diet. Also had a loose nonbloody BM last night. Has been getting Dilaudid frequently but stating that his pain is improving today. No other issues or overnight events.  Objective   Vitals:   05/20/19 1219 05/20/19 1805 05/21/19 0016 05/21/19 0559  BP: 133/85 140/77 134/85 (!) 142/81  Pulse: 74 80 81 77  Resp: 16 16 16 15   Temp: 97.6 F (36.4 C) 98.7 F (37.1 C) 98.1 F (36.7 C) 98.1 F (36.7 C)  TempSrc: Oral Oral Oral Oral  SpO2: 98% 98% 100% 98%  Weight:      Height:        Intake/Output Summary (Last 24 hours) at 05/21/2019 0834 Last data filed at 05/21/2019 0600 Gross per 24 hour  Intake 3404.31 ml  Output --  Net 3404.31 ml   Filed Weights   05/19/19 1024  Weight: 70 kg    Examination:  Physical Exam Vitals and nursing note reviewed. Exam conducted with a chaperone present.  Constitutional:      Appearance: Normal appearance.  HENT:     Head:  Normocephalic and atraumatic.  Eyes:     Conjunctiva/sclera: Conjunctivae normal.  Cardiovascular:     Rate and Rhythm: Normal rate and regular rhythm.  Pulmonary:     Effort: Pulmonary effort is normal.     Breath sounds: Normal breath sounds.  Abdominal:     General: Abdomen is flat.     Palpations: Abdomen is soft.  Musculoskeletal:        General: No swelling or tenderness.  Skin:    Coloration: Skin is not jaundiced or pale.  Neurological:     Mental Status: He is alert. Mental status is at baseline.  Psychiatric:        Mood and Affect: Mood normal.        Behavior: Behavior normal.     Data Reviewed: I have personally reviewed following labs and imaging studies  CBC: Recent Labs  Lab 05/18/19 0138 05/19/19 0149 05/19/19 0833 05/20/19 0508 05/21/19 0520  WBC 11.1* 9.2 13.9* 14.6* 12.6*  NEUTROABS  --  6.6  --   --   --   HGB 13.4 14.0 12.8* 12.1* 12.4*  HCT 39.7 41.7 37.9* 35.7* 37.2*  MCV 88.6 89.9 90.0 88.4 90.5  PLT 457* 467* 466* 409* 627*   Basic Metabolic Panel: Recent Labs  Lab 05/18/19 0138 05/19/19 0149 05/19/19 0833 05/20/19 0508 05/21/19 0520  NA 144 148* 142 141 139  K 3.7 4.1 4.3 4.2 4.3  CL 106 107 106 108 107  CO2 24 28 27 25 24   GLUCOSE 138* 85 114* 137* 207*  BUN 9 8 12 9 9   CREATININE 0.96 0.84 0.78 0.73 0.70  CALCIUM 9.2 9.1 8.4* 8.6* 8.6*  MG  --   --  2.0  --   --   PHOS  --   --  3.1  --   --    GFR: Estimated Creatinine Clearance: 111 mL/min (by C-G formula based on SCr of 0.7 mg/dL). Liver Function Tests: Recent Labs  Lab 05/18/19 0138 05/19/19 0149 05/19/19 0833  AST 18 23 31   ALT 18 20 24   ALKPHOS 98 90 87  BILITOT 0.6 0.3 0.3  PROT 6.3* 7.1 6.4*  ALBUMIN 3.2* 3.7 3.4*   No results for input(s): LIPASE, AMYLASE in the last 168 hours. No results for input(s): AMMONIA in the last 168 hours. Coagulation Profile: No results for input(s): INR, PROTIME in the last 168 hours. Cardiac Enzymes: No results for  input(s): CKTOTAL, CKMB, CKMBINDEX, TROPONINI in the last 168 hours. BNP (last 3 results) No results for input(s): PROBNP in the last 8760 hours. HbA1C: No results for input(s): HGBA1C in the last 72 hours. CBG: No results for input(s): GLUCAP in the last 168 hours. Lipid Profile: No  results for input(s): CHOL, HDL, LDLCALC, TRIG, CHOLHDL, LDLDIRECT in the last 72 hours. Thyroid Function Tests: No results for input(s): TSH, T4TOTAL, FREET4, T3FREE, THYROIDAB in the last 72 hours. Anemia Panel: No results for input(s): VITAMINB12, FOLATE, FERRITIN, TIBC, IRON, RETICCTPCT in the last 72 hours. Sepsis Labs: No results for input(s): PROCALCITON, LATICACIDVEN in the last 168 hours.  Recent Results (from the past 240 hour(s))  Respiratory Panel by RT PCR (Flu A&B, Covid) - Nasopharyngeal Swab     Status: None   Collection Time: 05/19/19  1:48 AM   Specimen: Nasopharyngeal Swab  Result Value Ref Range Status   SARS Coronavirus 2 by RT PCR NEGATIVE NEGATIVE Final    Comment: (NOTE) SARS-CoV-2 target nucleic acids are NOT DETECTED. The SARS-CoV-2 RNA is generally detectable in upper respiratoy specimens during the acute phase of infection. The lowest concentration of SARS-CoV-2 viral copies this assay can detect is 131 copies/mL. A negative result does not preclude SARS-Cov-2 infection and should not be used as the sole basis for treatment or other patient management decisions. A negative result may occur with  improper specimen collection/handling, submission of specimen other than nasopharyngeal swab, presence of viral mutation(s) within the areas targeted by this assay, and inadequate number of viral copies (<131 copies/mL). A negative result must be combined with clinical observations, patient history, and epidemiological information. The expected result is Negative. Fact Sheet for Patients:  PinkCheek.be Fact Sheet for Healthcare Providers:    GravelBags.it This test is not yet ap proved or cleared by the [place] FDA and  has been authorized for detection and/or diagnosis of SARS-CoV-2 by FDA under an Emergency Use Authorization (EUA). This EUA will remain  in effect (meaning this test can be used) for the duration of the COVID-19 declaration under Section 564(b)(1) of the Act, 21 U.S.C. section 360bbb-3(b)(1), unless the authorization is terminated or revoked sooner.    Influenza A by PCR NEGATIVE NEGATIVE Final   Influenza B by PCR NEGATIVE NEGATIVE Final    Comment: (NOTE) The Xpert Xpress SARS-CoV-2/FLU/RSV assay is intended as an aid in  the diagnosis of influenza from Nasopharyngeal swab specimens and  should not be used as a sole basis for treatment. Nasal washings and  aspirates are unacceptable for Xpert Xpress SARS-CoV-2/FLU/RSV  testing. Fact Sheet for Patients: PinkCheek.be Fact Sheet for Healthcare Providers: GravelBags.it This test is not yet approved or cleared by the [place] FDA and  has been authorized for detection and/or diagnosis of SARS-CoV-2 by  FDA under an Emergency Use Authorization (EUA). This EUA will remain  in effect (meaning this test can be used) for the duration of the  Covid-19 declaration under Section 564(b)(1) of the Act, 21  U.S.C. section 360bbb-3(b)(1), unless the authorization is  terminated or revoked. Performed at [facility], [place] [address], [place], [place] [address]          Radiology Studies: DG Abd 1 View  Result Date: 05/20/2019 CLINICAL DATA:  Abdominal pain and distention. EXAM: ABDOMEN - 1 VIEW COMPARISON:  April 20, 2018. FINDINGS: No abnormal bowel dilatation is noted. Mild amount of stool seen throughout the colon. No radio-opaque calculi or other significant radiographic abnormality are seen. IMPRESSION: Mild stool burden. No evidence of bowel  obstruction or ileus. Electronically Signed   By: Marijo Conception M.D.   On: 05/20/2019 14:48        Scheduled Meds: . amphetamine-dextroamphetamine  25 mg Oral q morning - 10a  . enoxaparin (  LOVENOX) injection  40 mg Subcutaneous Daily  . feeding supplement  1 Container Oral TID BM  . FLUoxetine  20 mg Oral Daily  . folic acid  1 mg Oral Daily  . methylPREDNISolone (SOLU-MEDROL) injection  40 mg Intravenous Q12H  . multivitamin with minerals  1 tablet Oral Daily  . pantoprazole  40 mg Oral Daily  . thiamine  100 mg Oral Daily   Or  . thiamine  100 mg Intravenous Daily   Continuous Infusions: . sodium chloride 125 mL/hr at 05/21/19 0525     Time spent: 25 minutes with over 50% of the time coordinating the patient's care    Harold Hedge, DO [facility] Pager [phone]  Call night coverage person covering after 7pm

## 2019-05-22 LAB — GLUCOSE, CAPILLARY
Glucose-Capillary: 134 mg/dL — ABNORMAL HIGH (ref 70–99)
Glucose-Capillary: 154 mg/dL — ABNORMAL HIGH (ref 70–99)

## 2019-05-22 MED ORDER — INSULIN ASPART 100 UNIT/ML ~~LOC~~ SOLN
0.0000 [IU] | Freq: Three times a day (TID) | SUBCUTANEOUS | Status: DC
  Administered 2019-05-23: 17:00:00 2 [IU] via SUBCUTANEOUS
  Administered 2019-05-24 – 2019-05-25 (×3): 1 [IU] via SUBCUTANEOUS

## 2019-05-22 MED ORDER — HYDROMORPHONE HCL 1 MG/ML IJ SOLN
0.5000 mg | INTRAMUSCULAR | Status: DC | PRN
  Administered 2019-05-22 – 2019-05-23 (×3): 0.5 mg via INTRAVENOUS
  Filled 2019-05-22 (×3): qty 0.5

## 2019-05-22 NOTE — Progress Notes (Signed)
PROGRESS NOTE    Jeffery Mcintosh    Code Status: Full Code  KGM:[id number] DOB: 1980/06/03 DOA: 05/19/2019 LOS: 2 days  PCP: Patient, No Pcp Per CC:  Chief Complaint  Patient presents with  . Suicide Attempt       Hospital Summary   This is a 39 year old male with past medical history of depression and bipolar disorder with multiple suicide attempts, Crohn's disease, GERD who was admitted on 4/15 after he had a suicide attempt at home and found to be in Crohn's flare on admission.  Both psych and GI have been consulted.   A & P   Principal Problem:   Crohn's colitis, with fistula ([place]) Active Problems:   Exacerbation of Crohn's disease ([place])   Bipolar 1 disorder ([place])   Suicidal ideation   Crohn's colitis ([place])   1. Crohn's disease exacerbation a. Was not taking his outpatient medications at time of admission b. CT on 4/14 showing active inflammation involving distal/terminal ileum and adjacent cecum with areas of fistulous between small bowel loops and no drainable abscess which has progressed since 01/04/2019 scan. c. Having nonbloody BMs d. GI on board recommending Continue Solu-Medrol twice daily and advance to soft diet  e. decrease frequency of dilaudid and encourage PO pain regimen as he needs to be off IV pain meds for DC.   2. Concern for low-grade partial SBO resolved  3. Depression with suicide attempt with history of prior attempts a. Psychiatry recommending inpatient admission once patient is medically cleared from a Crohn's standpoint. Expect patient should be medically stable for discharge in the next 24-48 hours b. Continue Prozac c. TOC team consulted  4. GERD a. Previously on omeprazole as an outpatient b. Continue Protonix p.o.  5. Normocytic anemia a. Likely anemia of chronic disease in setting of Crohn's flare b. Continue to monitor with no need for transfusion at this time  6. Reactive leukocytosis secondary to Crohn's  exacerbation  7. Hyperglycemia a. Very sensitive sliding scale and Ha1c  8. ADHD On Adderall  DVT prophylaxis: Lovenox Family Communication: Patient updated at bedside Disposition Plan: Plan to transfer to inpatient psychiatric facility once he is medically cleared hopefully stable in the next 24 to 48 hours.  He will need to be off IV pain medications as well as tolerating p.o. intake Status is: Inpatient  Remains inpatient appropriate because:Ongoing active pain requiring inpatient pain management and IV treatments appropriate due to intensity of illness or inability to take PO   Dispo: The patient is from: Home              Anticipated d/c is to:[facility] or other psychiatric facility              Anticipated d/c date is: 1 days              Patient currently is not medically stable to d/c.   Pressure injury documentation    None  Consultants  GI  Psych  Procedures  None  Antibiotics   Anti-infectives (From admission, onward)   None        Subjective   Tolerating p.o. intake this morning with improved abdominal pain.  Trying to stick to p.o. pain meds.  Had a nonbloody bowel movement last night.  No other issues Objective   Vitals:   05/21/19 1311 05/21/19 1830 05/21/19 2219 05/22/19 0547  BP: 136/85 119/82 121/77 126/80  Pulse: 72 68 72 60  Resp: 18 16 16 16   Temp: 97.9 F (  36.6 C) 98.5 F (36.9 C) 98.5 F (36.9 C) 98.4 F (36.9 C)  TempSrc: Oral Oral Oral Oral  SpO2: 99% 97% 98% 97%  Weight:      Height:        Intake/Output Summary (Last 24 hours) at 05/22/2019 1353 Last data filed at 05/21/2019 1803 Gross per 24 hour  Intake 600 ml  Output --  Net 600 ml   Filed Weights   05/19/19 1024  Weight: 70 kg    Examination:  Physical Exam Vitals and nursing note reviewed.  Constitutional:      Appearance: Normal appearance.  HENT:     Head: Normocephalic and atraumatic.  Eyes:     Conjunctiva/sclera: Conjunctivae normal.  Cardiovascular:      Rate and Rhythm: Normal rate and regular rhythm.  Pulmonary:     Effort: Pulmonary effort is normal.     Breath sounds: Normal breath sounds.  Abdominal:     General: Abdomen is flat.     Palpations: Abdomen is soft.     Comments: Mild tenderness to palpation  Musculoskeletal:        General: No swelling or tenderness.  Skin:    Coloration: Skin is not jaundiced or pale.  Neurological:     Mental Status: He is alert. Mental status is at baseline.  Psychiatric:        Mood and Affect: Mood normal.        Behavior: Behavior normal.     Data Reviewed: I have personally reviewed following labs and imaging studies  CBC: Recent Labs  Lab 05/18/19 0138 05/19/19 0149 05/19/19 0833 05/20/19 0508 05/21/19 0520  WBC 11.1* 9.2 13.9* 14.6* 12.6*  NEUTROABS  --  6.6  --   --   --   HGB 13.4 14.0 12.8* 12.1* 12.4*  HCT 39.7 41.7 37.9* 35.7* 37.2*  MCV 88.6 89.9 90.0 88.4 90.5  PLT 457* 467* 466* 409* 425*   Basic Metabolic Panel: Recent Labs  Lab 05/18/19 0138 05/19/19 0149 05/19/19 0833 05/20/19 0508 05/21/19 0520  NA 144 148* 142 141 139  K 3.7 4.1 4.3 4.2 4.3  CL 106 107 106 108 107  CO2 24 28 27 25 24   GLUCOSE 138* 85 114* 137* 207*  BUN 9 8 12 9 9   CREATININE 0.96 0.84 0.78 0.73 0.70  CALCIUM 9.2 9.1 8.4* 8.6* 8.6*  MG  --   --  2.0  --   --   PHOS  --   --  3.1  --   --    GFR: Estimated Creatinine Clearance: 111 mL/min (by C-G formula based on SCr of 0.7 mg/dL). Liver Function Tests: Recent Labs  Lab 05/18/19 0138 05/19/19 0149 05/19/19 0833  AST 18 23 31   ALT 18 20 24   ALKPHOS 98 90 87  BILITOT 0.6 0.3 0.3  PROT 6.3* 7.1 6.4*  ALBUMIN 3.2* 3.7 3.4*   No results for input(s): LIPASE, AMYLASE in the last 168 hours. No results for input(s): AMMONIA in the last 168 hours. Coagulation Profile: No results for input(s): INR, PROTIME in the last 168 hours. Cardiac Enzymes: No results for input(s): CKTOTAL, CKMB, CKMBINDEX, TROPONINI in the last 168  hours. BNP (last 3 results) No results for input(s): PROBNP in the last 8760 hours. HbA1C: No results for input(s): HGBA1C in the last 72 hours. CBG: No results for input(s): GLUCAP in the last 168 hours. Lipid Profile: No results for input(s): CHOL, HDL, LDLCALC, TRIG, CHOLHDL, LDLDIRECT in the last  72 hours. Thyroid Function Tests: No results for input(s): TSH, T4TOTAL, FREET4, T3FREE, THYROIDAB in the last 72 hours. Anemia Panel: No results for input(s): VITAMINB12, FOLATE, FERRITIN, TIBC, IRON, RETICCTPCT in the last 72 hours. Sepsis Labs: No results for input(s): PROCALCITON, LATICACIDVEN in the last 168 hours.  Recent Results (from the past 240 hour(s))  Respiratory Panel by RT PCR (Flu A&B, Covid) - Nasopharyngeal Swab     Status: None   Collection Time: 05/19/19  1:48 AM   Specimen: Nasopharyngeal Swab  Result Value Ref Range Status   SARS Coronavirus 2 by RT PCR NEGATIVE NEGATIVE Final    Comment: (NOTE) SARS-CoV-2 target nucleic acids are NOT DETECTED. The SARS-CoV-2 RNA is generally detectable in upper respiratoy specimens during the acute phase of infection. The lowest concentration of SARS-CoV-2 viral copies this assay can detect is 131 copies/mL. A negative result does not preclude SARS-Cov-2 infection and should not be used as the sole basis for treatment or other patient management decisions. A negative result may occur with  improper specimen collection/handling, submission of specimen other than nasopharyngeal swab, presence of viral mutation(s) within the areas targeted by this assay, and inadequate number of viral copies (<131 copies/mL). A negative result must be combined with clinical observations, patient history, and epidemiological information. The expected result is Negative. Fact Sheet for Patients:  PinkCheek.be Fact Sheet for Healthcare Providers:  GravelBags.it This test is not yet ap proved  or cleared by the [place] FDA and  has been authorized for detection and/or diagnosis of SARS-CoV-2 by FDA under an Emergency Use Authorization (EUA). This EUA will remain  in effect (meaning this test can be used) for the duration of the COVID-19 declaration under Section 564(b)(1) of the Act, 21 U.S.C. section 360bbb-3(b)(1), unless the authorization is terminated or revoked sooner.    Influenza A by PCR NEGATIVE NEGATIVE Final   Influenza B by PCR NEGATIVE NEGATIVE Final    Comment: (NOTE) The Xpert Xpress SARS-CoV-2/FLU/RSV assay is intended as an aid in  the diagnosis of influenza from Nasopharyngeal swab specimens and  should not be used as a sole basis for treatment. Nasal washings and  aspirates are unacceptable for Xpert Xpress SARS-CoV-2/FLU/RSV  testing. Fact Sheet for Patients: PinkCheek.be Fact Sheet for Healthcare Providers: GravelBags.it This test is not yet approved or cleared by the [place] FDA and  has been authorized for detection and/or diagnosis of SARS-CoV-2 by  FDA under an Emergency Use Authorization (EUA). This EUA will remain  in effect (meaning this test can be used) for the duration of the  Covid-19 declaration under Section 564(b)(1) of the Act, 21  U.S.C. section 360bbb-3(b)(1), unless the authorization is  terminated or revoked. Performed at [facility], [place] [address] [place], [place] [address]          Radiology Studies: DG Abd 1 View  Result Date: 05/20/2019 CLINICAL DATA:  Abdominal pain and distention. EXAM: ABDOMEN - 1 VIEW COMPARISON:  April 20, 2018. FINDINGS: No abnormal bowel dilatation is noted. Mild amount of stool seen throughout the colon. No radio-opaque calculi or other significant radiographic abnormality are seen. IMPRESSION: Mild stool burden. No evidence of bowel obstruction or ileus. Electronically Signed   By: Marijo Conception M.D.   On:  05/20/2019 14:48        Scheduled Meds: . amphetamine-dextroamphetamine  25 mg Oral q morning - 10a  . enoxaparin (LOVENOX) injection  40 mg Subcutaneous Daily  . feeding supplement  1  Container Oral TID BM  . FLUoxetine  20 mg Oral Daily  . folic acid  1 mg Oral Daily  . methylPREDNISolone (SOLU-MEDROL) injection  40 mg Intravenous Q12H  . multivitamin with minerals  1 tablet Oral Daily  . pantoprazole  40 mg Oral Daily  . thiamine  100 mg Oral Daily   Or  . thiamine  100 mg Intravenous Daily   Continuous Infusions: . sodium chloride 125 mL/hr at 05/22/19 0845     Time spent: 20 minutes with over 50% of the time coordinating the patient's care    Harold Hedge, DO [facility] Pager [phone]  Call night coverage person covering after 7pm

## 2019-05-22 NOTE — Progress Notes (Signed)
[facility] Gastroenterology Progress Note  Jeffery Mcintosh 39 y.o. 09-02-1980   Subjective: Reports one loose nonbloody stool overnight (not seen by staff). Denies rectal bleeding. Abdominal pain better. Hungry.  Objective: Vital signs: Vitals:   05/21/19 2219 05/22/19 0547  BP: 121/77 126/80  Pulse: 72 60  Resp: 16 16  Temp: 98.5 F (36.9 C) 98.4 F (36.9 C)  SpO2: 98% 97%    Physical Exam: Gen: alert, no acute distress, well-nourished HEENT: anicteric sclera CV: RRR Chest: CTA B Abd: lower abdominal tenderness with minimal guarding, soft, nondistended, +BS Ext: no edema  Lab Results: Recent Labs    05/20/19 0508 05/21/19 0520  NA 141 139  K 4.2 4.3  CL 108 107  CO2 25 24  GLUCOSE 137* 207*  BUN 9 9  CREATININE 0.73 0.70  CALCIUM 8.6* 8.6*   No results for input(s): AST, ALT, ALKPHOS, BILITOT, PROT, ALBUMIN in the last 72 hours. Recent Labs    05/20/19 0508 05/21/19 0520  WBC 14.6* 12.6*  HGB 12.1* 12.4*  HCT 35.7* 37.2*  MCV 88.4 90.5  PLT 409* 441*      Assessment/Plan: Crohn's flare with fistulizing disease - slowly improving with IV steroids. Changed diet to soft. Supportive care. Will f/u.   Jeffery Mcintosh 05/22/2019, 11:50 AM  Questions please call [phone] ID: Jeffery Mcintosh, male   DOB: September 20, 1980, 39 y.o.   MRN: [id number]

## 2019-05-23 LAB — HEMOGLOBIN A1C
Hgb A1c MFr Bld: 5.6 % (ref 4.8–5.6)
Mean Plasma Glucose: 114.02 mg/dL

## 2019-05-23 LAB — GLUCOSE, CAPILLARY
Glucose-Capillary: 130 mg/dL — ABNORMAL HIGH (ref 70–99)
Glucose-Capillary: 113 mg/dL — ABNORMAL HIGH (ref 70–99)
Glucose-Capillary: 227 mg/dL — ABNORMAL HIGH (ref 70–99)

## 2019-05-23 MED ORDER — HYDROMORPHONE HCL 1 MG/ML IJ SOLN: 0.5000 mg | Freq: Four times a day (QID) | INTRAMUSCULAR | Status: DC | PRN

## 2019-05-23 MED ORDER — HYDROCODONE-ACETAMINOPHEN 7.5-325 MG PO TABS
1.0000 | ORAL_TABLET | Freq: Four times a day (QID) | ORAL | Status: DC | PRN
  Administered 2019-05-23 – 2019-05-25 (×7): 2 via ORAL
  Filled 2019-05-23 (×7): qty 2

## 2019-05-23 MED ORDER — PREDNISONE 20 MG PO TABS
60.0000 mg | ORAL_TABLET | Freq: Every day | ORAL | Status: DC
  Administered 2019-05-24 – 2019-05-25 (×2): 60 mg via ORAL
  Filled 2019-05-23 (×2): qty 3

## 2019-05-23 NOTE — Progress Notes (Signed)
[facility] Gastroenterology Progress Note  Jeffery Mcintosh 39 y.o. 01-Dec-1980  CC: abdominal pain, fistulizing Crohn's disease    Subjective: Patient encountered in bed sleeping.  He reports continued abdominal pain, currently 6/10.  He states he had 1 loose bowel movement yesterday without any visible bleeding.  He has not had any bowel movements today.  He denies any nausea or vomiting.  ROS : Review of Systems  Constitutional: Negative for chills and fever.  Gastrointestinal: Positive for abdominal pain and diarrhea. Negative for blood in stool, constipation, heartburn, melena, nausea and vomiting.    Objective: Vital signs in last 24 hours: Vitals:   05/22/19 2142 05/23/19 0447  BP: 138/82 132/73  Pulse: 72 (!) 57  Resp: 18 17  Temp: 98.4 F (36.9 C) 97.8 F (36.6 C)  SpO2: 97% 98%    Physical Exam:  Physical Exam  Constitutional: He is oriented to person, place, and time. He appears well-developed and well-nourished. No distress.  Pulmonary/Chest: Breath sounds normal. No respiratory distress.  Abdominal: Soft. Bowel sounds are normal. He exhibits no distension and no mass. There is abdominal tenderness (mild, diffuse). There is no rebound and no guarding.  Musculoskeletal:        General: No deformity or edema.  Neurological: He is oriented to person, place, and time.  Sleeping but easily arouses to voice alone  Skin: Skin is warm and dry.      Lab Results: Recent Labs    05/21/19 0520  NA 139  K 4.3  CL 107  CO2 24  GLUCOSE 207*  BUN 9  CREATININE 0.70  CALCIUM 8.6*   No results for input(s): AST, ALT, ALKPHOS, BILITOT, PROT, ALBUMIN in the last 72 hours. Recent Labs    05/21/19 0520  WBC 12.6*  HGB 12.4*  HCT 37.2*  MCV 90.5  PLT 441*   No results for input(s): LABPROT, INR in the last 72 hours.    Assessment/Plan: Fistulizing Crohn's disease with current exacerbation. Continue IV methylprednisolone twice daily. Continue soft diet. Plan  for transition to PO prednisone over the next few days. Patient to be transferred to inpatient psychiatry once medically stable.  Salley Slaughter PA-C 05/23/2019, 12:55 PM  Contact #  [phone]

## 2019-05-23 NOTE — Care Management Important Message (Signed)
Important Message  Patient Details IM Letter given to Marney Doctor RN Case Manager to present to the Patient Name: Jeffery Mcintosh MRN: [id number] Date of Birth: 1980-09-18   Medicare Important Message Given:  Yes     Kerin Salen 05/23/2019, 9:56 AM

## 2019-05-23 NOTE — Progress Notes (Signed)
ANTICOAGULATION CONSULT NOTE  Pharmacy Consult for Lovenox Indication: VTE prophylaxis  No Known Allergies  Patient Measurements: Height: 5' 3"  (160 cm) Weight: 70 kg (154 lb 5.2 oz) IBW/kg (Calculated) : 56.9  Vital Signs: Temp: 97.8 F (36.6 C) (04/19 0447) Temp Source: Oral (04/19 0447) BP: 132/73 (04/19 0447) Pulse Rate: 57 (04/19 0447)  Labs: Recent Labs    05/21/19 0520  HGB 12.4*  HCT 37.2*  PLT 441*  CREATININE 0.70    Estimated Creatinine Clearance: 111 mL/min (by C-G formula based on SCr of 0.7 mg/dL).   Medical History: Past Medical History:  Diagnosis Date  . Anxiety   . Arthritis   . Bipolar affective ([place])   . Chronic headaches   . Crohn's disease ([place]) history of  . Depression   . GERD (gastroesophageal reflux disease)   . Hypertension   . Noncompliance 04/17/2018  . PTSD (post-traumatic stress disorder) 04/17/2018  . Schizophrenic disorder ([place])     Medications:  Medications Prior to Admission  Medication Sig Dispense Refill Last Dose  . amphetamine-dextroamphetamine (ADDERALL XR) 25 MG 24 hr capsule Take 25 mg by mouth every morning.   Past Week at Unknown time  . FLUoxetine (PROZAC) 10 MG capsule Take 20 mg by mouth daily.   Past Week at Unknown time  . Multiple Vitamin (MULTIVITAMIN WITH MINERALS) TABS tablet Take 1 tablet by mouth daily.   Past Week at Unknown time  . predniSONE (DELTASONE) 10 MG tablet Take 4 tablets (40 mg total) by mouth daily for 5 days. 20 tablet 0 not started   Scheduled:  . amphetamine-dextroamphetamine  25 mg Oral q morning - 10a  . enoxaparin (LOVENOX) injection  40 mg Subcutaneous Daily  . feeding supplement  1 Container Oral TID BM  . FLUoxetine  20 mg Oral Daily  . folic acid  1 mg Oral Daily  . insulin aspart  0-6 Units Subcutaneous TID WC  . methylPREDNISolone (SOLU-MEDROL) injection  40 mg Intravenous Q12H  . multivitamin with minerals  1 tablet Oral Daily  . pantoprazole  40 mg Oral Daily  . thiamine   100 mg Oral Daily   Or  . thiamine  100 mg Intravenous Daily   Infusions:  . sodium chloride 125 mL/hr at 05/23/19 0600     Assessment: Patient is a  39 y.o. male admitted 05/19/2019 for suicide attempt and Crohn's flare. He's currently on lovenox for VTE prophylaxis.  Today, 05/23/2019: - scr 0.70 on 4/17 - cbc stable - no bleeding documented  Plan: - continue Lovenox 40 mg SQ q24h   Dia Sitter, PharmD, BCPS 05/23/2019 7:58 AM

## 2019-05-23 NOTE — Progress Notes (Signed)
PROGRESS NOTE    Webber Michiels    Code Status: Full Code  [id number] DOB: 11-02-1980 DOA: 05/19/2019 LOS: 3 days  PCP: Patient, No Pcp Per CC:  Chief Complaint  Patient presents with  . Suicide Attempt       Hospital Summary   This is a 39 year old male with past medical history of depression and bipolar disorder with multiple suicide attempts, Crohn's disease, GERD who was admitted on 4/15 after he had a suicide attempt at home and found to be in Crohn's flare on admission.  Both psych and GI have been consulted.   A & P   Principal Problem:   Crohn's colitis, with fistula ([place]) Active Problems:   Exacerbation of Crohn's disease ([place])   Bipolar 1 disorder ([place])   Suicidal ideation   Crohn's colitis ([place])   1. Crohn's disease exacerbation a. Was not taking his outpatient medications at time of admission b. CT on 4/14 showing active inflammation involving distal/terminal ileum and adjacent cecum with areas of fistulous between small bowel loops and no drainable abscess which has progressed since 01/04/2019 scan. c. Having nonbloody BMs d. GI on board recommending to continue Solu-Medrol twice daily and continue soft diet  e. decrease frequency of dilaudid and encourage PO pain regimen   2. Concern for low-grade partial SBO resolved  3. Depression with suicide attempt with history of prior attempts a. Psychiatry recommending inpatient admission once patient is medically cleared from a Crohn's standpoint. Expect patient should be medically stable for discharge in the next 24-48 hours once he can tolerate PO pain regimen and PO steroids b. Continue Prozac c. TOC team consulted  4. GERD a. Previously on omeprazole as an outpatient b. Continue Protonix p.o.  5. Normocytic anemia a. Likely anemia of chronic disease in setting of Crohn's flare b. Continue to monitor with no need for transfusion at this time  6. Reactive leukocytosis secondary to Crohn's  exacerbation  7. Hyperglycemia a. Very sensitive sliding scale and Ha1c  8. ADHD On Adderall  DVT prophylaxis: Lovenox Family Communication: Patient updated at bedside Disposition Plan: plan to Dc to inpatient psych once he is medically stable. Patient needs to be off IV steroids and pain meds and tolerating PO intake to be considered medically stable. Hopefully in the next 24 - 48 hours. Status is: Inpatient  Remains inpatient appropriate because:Ongoing active pain requiring inpatient pain management and IV treatments appropriate due to intensity of illness or inability to take PO   Dispo: The patient is from: Home              Anticipated d/c is to:[facility] or other psychiatric facility              Anticipated d/c date is: 1 days              Patient currently is not medically stable to d/c.   Pressure injury documentation    None  Consultants  GI  Psych  Procedures  None  Antibiotics   Anti-infectives (From admission, onward)   None        Subjective   Reports continued improvement in symptoms but still requiring occasional IV dilaudid. Tolerating soft diet. Admits to [facility] yesterday. No other complaints or overnight events.   Objective   Vitals:   05/22/19 0547 05/22/19 1632 05/22/19 2142 05/23/19 0447  BP: 126/80 (!) 142/81 138/82 132/73  Pulse: 60 72 72 (!) 57  Resp: 16 20 18 17   Temp: 98.4 F (36.9  C) 98.3 F (36.8 C) 98.4 F (36.9 C) 97.8 F (36.6 C)  TempSrc: Oral Oral Oral Oral  SpO2: 97% 98% 97% 98%  Weight:      Height:        Intake/Output Summary (Last 24 hours) at 05/23/2019 1106 Last data filed at 05/23/2019 0600 Gross per 24 hour  Intake 5880.88 ml  Output --  Net 5880.88 ml   Filed Weights   05/19/19 1024  Weight: 70 kg    Examination:  Physical Exam Vitals and nursing note reviewed.  Constitutional:      Appearance: Normal appearance.  Cardiovascular:     Rate and Rhythm: Normal rate and regular rhythm.  Pulmonary:      Effort: Pulmonary effort is normal.     Breath sounds: Normal breath sounds.  Abdominal:     General: Abdomen is flat. Bowel sounds are normal.     Palpations: Abdomen is soft.  Musculoskeletal:        General: No swelling or tenderness.  Skin:    Coloration: Skin is not jaundiced or pale.  Neurological:     Mental Status: He is alert. Mental status is at baseline.  Psychiatric:        Behavior: Behavior normal.     Data Reviewed: I have personally reviewed following labs and imaging studies  CBC: Recent Labs  Lab 05/18/19 0138 05/19/19 0149 05/19/19 0833 05/20/19 0508 05/21/19 0520  WBC 11.1* 9.2 13.9* 14.6* 12.6*  NEUTROABS  --  6.6  --   --   --   HGB 13.4 14.0 12.8* 12.1* 12.4*  HCT 39.7 41.7 37.9* 35.7* 37.2*  MCV 88.6 89.9 90.0 88.4 90.5  PLT 457* 467* 466* 409* 174*   Basic Metabolic Panel: Recent Labs  Lab 05/18/19 0138 05/19/19 0149 05/19/19 0833 05/20/19 0508 05/21/19 0520  NA 144 148* 142 141 139  K 3.7 4.1 4.3 4.2 4.3  CL 106 107 106 108 107  CO2 24 28 27 25 24   GLUCOSE 138* 85 114* 137* 207*  BUN 9 8 12 9 9   CREATININE 0.96 0.84 0.78 0.73 0.70  CALCIUM 9.2 9.1 8.4* 8.6* 8.6*  MG  --   --  2.0  --   --   PHOS  --   --  3.1  --   --    GFR: Estimated Creatinine Clearance: 111 mL/min (by C-G formula based on SCr of 0.7 mg/dL). Liver Function Tests: Recent Labs  Lab 05/18/19 0138 05/19/19 0149 05/19/19 0833  AST 18 23 31   ALT 18 20 24   ALKPHOS 98 90 87  BILITOT 0.6 0.3 0.3  PROT 6.3* 7.1 6.4*  ALBUMIN 3.2* 3.7 3.4*   No results for input(s): LIPASE, AMYLASE in the last 168 hours. No results for input(s): AMMONIA in the last 168 hours. Coagulation Profile: No results for input(s): INR, PROTIME in the last 168 hours. Cardiac Enzymes: No results for input(s): CKTOTAL, CKMB, CKMBINDEX, TROPONINI in the last 168 hours. BNP (last 3 results) No results for input(s): PROBNP in the last 8760 hours. HbA1C: Recent Labs    05/23/19 0540   HGBA1C 5.6   CBG: Recent Labs  Lab 05/22/19 1735 05/22/19 2135 05/23/19 0739  GLUCAP 134* 154* 130*   Lipid Profile: No results for input(s): CHOL, HDL, LDLCALC, TRIG, CHOLHDL, LDLDIRECT in the last 72 hours. Thyroid Function Tests: No results for input(s): TSH, T4TOTAL, FREET4, T3FREE, THYROIDAB in the last 72 hours. Anemia Panel: No results for input(s): VITAMINB12, FOLATE, FERRITIN,  TIBC, IRON, RETICCTPCT in the last 72 hours. Sepsis Labs: No results for input(s): PROCALCITON, LATICACIDVEN in the last 168 hours.  Recent Results (from the past 240 hour(s))  Respiratory Panel by RT PCR (Flu A&B, Covid) - Nasopharyngeal Swab     Status: None   Collection Time: 05/19/19  1:48 AM   Specimen: Nasopharyngeal Swab  Result Value Ref Range Status   SARS Coronavirus 2 by RT PCR NEGATIVE NEGATIVE Final    Comment: (NOTE) SARS-CoV-2 target nucleic acids are NOT DETECTED. The SARS-CoV-2 RNA is generally detectable in upper respiratoy specimens during the acute phase of infection. The lowest concentration of SARS-CoV-2 viral copies this assay can detect is 131 copies/mL. A negative result does not preclude SARS-Cov-2 infection and should not be used as the sole basis for treatment or other patient management decisions. A negative result may occur with  improper specimen collection/handling, submission of specimen other than nasopharyngeal swab, presence of viral mutation(s) within the areas targeted by this assay, and inadequate number of viral copies (<131 copies/mL). A negative result must be combined with clinical observations, patient history, and epidemiological information. The expected result is Negative. Fact Sheet for Patients:  PinkCheek.be Fact Sheet for Healthcare Providers:  GravelBags.it This test is not yet ap proved or cleared by the [place] FDA and  has been authorized for detection and/or diagnosis of  SARS-CoV-2 by FDA under an Emergency Use Authorization (EUA). This EUA will remain  in effect (meaning this test can be used) for the duration of the COVID-19 declaration under Section 564(b)(1) of the Act, 21 U.S.C. section 360bbb-3(b)(1), unless the authorization is terminated or revoked sooner.    Influenza A by PCR NEGATIVE NEGATIVE Final   Influenza B by PCR NEGATIVE NEGATIVE Final    Comment: (NOTE) The Xpert Xpress SARS-CoV-2/FLU/RSV assay is intended as an aid in  the diagnosis of influenza from Nasopharyngeal swab specimens and  should not be used as a sole basis for treatment. Nasal washings and  aspirates are unacceptable for Xpert Xpress SARS-CoV-2/FLU/RSV  testing. Fact Sheet for Patients: PinkCheek.be Fact Sheet for Healthcare Providers: GravelBags.it This test is not yet approved or cleared by the [place] FDA and  has been authorized for detection and/or diagnosis of SARS-CoV-2 by  FDA under an Emergency Use Authorization (EUA). This EUA will remain  in effect (meaning this test can be used) for the duration of the  Covid-19 declaration under Section 564(b)(1) of the Act, 21  U.S.C. section 360bbb-3(b)(1), unless the authorization is  terminated or revoked. Performed at [facility], [place] [address], [place], [place] [address]          Radiology Studies: No results found.      Scheduled Meds: . amphetamine-dextroamphetamine  25 mg Oral q morning - 10a  . enoxaparin (LOVENOX) injection  40 mg Subcutaneous Daily  . feeding supplement  1 Container Oral TID BM  . FLUoxetine  20 mg Oral Daily  . folic acid  1 mg Oral Daily  . insulin aspart  0-6 Units Subcutaneous TID WC  . methylPREDNISolone (SOLU-MEDROL) injection  40 mg Intravenous Q12H  . multivitamin with minerals  1 tablet Oral Daily  . pantoprazole  40 mg Oral Daily  . thiamine  100 mg Oral Daily   Or  . thiamine   100 mg Intravenous Daily   Continuous Infusions: . sodium chloride 125 mL/hr at 05/23/19 1007     Time spent: 18 minutes with over 50% of the  time coordinating the patient's care    Harold Hedge, DO [facility] Pager [phone]  Call night coverage person covering after 7pm

## 2019-05-24 LAB — BASIC METABOLIC PANEL
Anion gap: 6 (ref 5–15)
BUN: 10 mg/dL (ref 6–20)
CO2: 26 mmol/L (ref 22–32)
Calcium: 8.2 mg/dL — ABNORMAL LOW (ref 8.9–10.3)
Chloride: 107 mmol/L (ref 98–111)
Creatinine, Ser: 0.58 mg/dL — ABNORMAL LOW (ref 0.61–1.24)
GFR calc Af Amer: >60 mL/min (ref >60)
GFR calc non Af Amer: >60 mL/min (ref >60)
Glucose, Bld: 108 mg/dL — ABNORMAL HIGH (ref 70–99)
Potassium: 3.4 mmol/L — ABNORMAL LOW (ref 3.5–5.1)
Sodium: 139 mmol/L (ref 135–145)

## 2019-05-24 LAB — GLUCOSE, CAPILLARY
Glucose-Capillary: 86 mg/dL (ref 70–99)
Glucose-Capillary: 156 mg/dL — ABNORMAL HIGH (ref 70–99)
Glucose-Capillary: 161 mg/dL — ABNORMAL HIGH (ref 70–99)
Glucose-Capillary: 129 mg/dL — ABNORMAL HIGH (ref 70–99)

## 2019-05-24 LAB — CBC
HCT: 36.1 % — ABNORMAL LOW (ref 39.0–52.0)
Hemoglobin: 11.9 g/dL — ABNORMAL LOW (ref 13.0–17.0)
MCH: 29.9 pg (ref 26.0–34.0)
MCHC: 33 g/dL (ref 30.0–36.0)
MCV: 90.7 fL (ref 80.0–100.0)
Platelets: 377 10*3/uL (ref 150–400)
RBC: 3.98 MIL/uL — ABNORMAL LOW (ref 4.22–5.81)
RDW: 14.3 % (ref 11.5–15.5)
WBC: 12.1 10*3/uL — ABNORMAL HIGH (ref 4.0–10.5)
nRBC: 0 % (ref 0.0–0.2)

## 2019-05-24 NOTE — Progress Notes (Signed)
PROGRESS NOTE    Jeffery Mcintosh    Code Status: Full Code  QJJ:[id number] DOB: [birth year] DOA: 05/19/2019 LOS: 4 days  PCP: Patient, No Pcp Per CC:  Chief Complaint  Patient presents with  . Suicide Attempt       Hospital Summary   This is a 39 year old male with past medical history of depression and bipolar disorder with multiple suicide attempts, Crohn's disease, GERD who was admitted on 4/15 after he had a suicide attempt at home and found to be in Crohn's flare on admission.  Both psych and GI have been consulted.   4/19 patient tolerating PO intake and transitioned from IV solumedrol to prednisone PO  A & P   Principal Problem:   Crohn's colitis, with fistula ([place]) Active Problems:   Exacerbation of Crohn's disease ([place])   Bipolar 1 disorder ([place])   Suicidal ideation   Crohn's colitis ([place])   1. Crohn's disease exacerbation a. Was not taking his outpatient medications at time of admission b. CT on 4/14 showing active inflammation involving distal/terminal ileum and adjacent cecum with areas of fistulous between small bowel loops and no drainable abscess which has progressed since 01/04/2019 scan. c. Stable from a GI standpoint and switched from IV to PO steroids yesterday d. Stable on PO pain regimen  2. Concern for low-grade partial SBO resolved  3. Depression with suicide attempt with history of prior attempts a. Medically stable, appreciate psychiatry recommendations b. Continue Prozac c. TOC team consulted  4. GERD a. Previously on omeprazole as an outpatient b. Continue Protonix p.o.  5. Normocytic anemia a. Likely anemia of chronic disease in setting of Crohn's flare b. Continue to monitor with no need for transfusion at this time  6. Steroid induced leukocytosis  7. Hyperglycemia a. Very sensitive sliding scale b. HA1c 5.6  8. ADHD On Adderall  DVT prophylaxis: Lovenox Family Communication: Patient updated at bedside Disposition  Plan: Status is: Inpatient  Remains inpatient appropriate because:Unsafe d/c plan   Dispo: The patient is from: Home              Anticipated d/c is to: inpatient psychiatric facility              Anticipated d/c date is: 1 day              Patient currently is medically stable to d/c.   Pressure injury documentation    None  Consultants  GI  Psych  Procedures  None  Antibiotics   Anti-infectives (From admission, onward)   None        Subjective   Reports to tolerating PO intake and having BMs. Having some abdominal pain which is tolerable with PO meds. No other complaints or overnight events.  Objective   Vitals:   05/23/19 0447 05/23/19 1356 05/23/19 2241 05/24/19 0653  BP: 132/73 (!) 147/79 (!) 148/79 135/76  Pulse: (!) 57 72 63 75  Resp: 17 18 17 16   Temp: 97.8 F (36.6 C) 98.2 F (36.8 C) 97.6 F (36.4 C) 98.3 F (36.8 C)  TempSrc: Oral Oral Oral Oral  SpO2: 98% 100% 100% 97%  Weight:      Height:        Intake/Output Summary (Last 24 hours) at 05/24/2019 0847 Last data filed at 05/24/2019 0819 Gross per 24 hour  Intake 1807.97 ml  Output --  Net 1807.97 ml   Filed Weights   05/19/19 1024  Weight: 70 kg    Examination:  Physical  Exam Vitals and nursing note reviewed.  Constitutional:      Appearance: Normal appearance.  HENT:     Head: Normocephalic and atraumatic.  Eyes:     Conjunctiva/sclera: Conjunctivae normal.  Cardiovascular:     Rate and Rhythm: Normal rate and regular rhythm.  Pulmonary:     Effort: Pulmonary effort is normal.     Breath sounds: Normal breath sounds.  Abdominal:     General: Abdomen is flat.     Palpations: Abdomen is soft.     Comments: Mild abdominal tenderness to palpation  Musculoskeletal:        General: No swelling or tenderness.  Skin:    Coloration: Skin is not jaundiced or pale.  Neurological:     Mental Status: He is alert. Mental status is at baseline.  Psychiatric:        Mood and  Affect: Mood normal.        Behavior: Behavior normal.     Data Reviewed: I have personally reviewed following labs and imaging studies  CBC: Recent Labs  Lab 05/19/19 0149 05/19/19 0833 05/20/19 0508 05/21/19 0520 05/24/19 0541  WBC 9.2 13.9* 14.6* 12.6* 12.1*  NEUTROABS 6.6  --   --   --   --   HGB 14.0 12.8* 12.1* 12.4* 11.9*  HCT 41.7 37.9* 35.7* 37.2* 36.1*  MCV 89.9 90.0 88.4 90.5 90.7  PLT 467* 466* 409* 441* 573   Basic Metabolic Panel: Recent Labs  Lab 05/19/19 0149 05/19/19 0833 05/20/19 0508 05/21/19 0520 05/24/19 0541  NA 148* 142 141 139 139  K 4.1 4.3 4.2 4.3 3.4*  CL 107 106 108 107 107  CO2 28 27 25 24 26   GLUCOSE 85 114* 137* 207* 108*  BUN 8 12 9 9 10   CREATININE 0.84 0.78 0.73 0.70 0.58*  CALCIUM 9.1 8.4* 8.6* 8.6* 8.2*  MG  --  2.0  --   --   --   PHOS  --  3.1  --   --   --    GFR: Estimated Creatinine Clearance: 111 mL/min (A) (by C-G formula based on SCr of 0.58 mg/dL (L)). Liver Function Tests: Recent Labs  Lab 05/18/19 0138 05/19/19 0149 05/19/19 0833  AST 18 23 31   ALT 18 20 24   ALKPHOS 98 90 87  BILITOT 0.6 0.3 0.3  PROT 6.3* 7.1 6.4*  ALBUMIN 3.2* 3.7 3.4*   No results for input(s): LIPASE, AMYLASE in the last 168 hours. No results for input(s): AMMONIA in the last 168 hours. Coagulation Profile: No results for input(s): INR, PROTIME in the last 168 hours. Cardiac Enzymes: No results for input(s): CKTOTAL, CKMB, CKMBINDEX, TROPONINI in the last 168 hours. BNP (last 3 results) No results for input(s): PROBNP in the last 8760 hours. HbA1C: Recent Labs    05/23/19 0540  HGBA1C 5.6   CBG: Recent Labs  Lab 05/22/19 2135 05/23/19 0739 05/23/19 1137 05/23/19 1637 05/24/19 0735  GLUCAP 154* 130* 113* 227* 86   Lipid Profile: No results for input(s): CHOL, HDL, LDLCALC, TRIG, CHOLHDL, LDLDIRECT in the last 72 hours. Thyroid Function Tests: No results for input(s): TSH, T4TOTAL, FREET4, T3FREE, THYROIDAB in the last  72 hours. Anemia Panel: No results for input(s): VITAMINB12, FOLATE, FERRITIN, TIBC, IRON, RETICCTPCT in the last 72 hours. Sepsis Labs: No results for input(s): PROCALCITON, LATICACIDVEN in the last 168 hours.  Recent Results (from the past 240 hour(s))  Respiratory Panel by RT PCR (Flu A&B, Covid) - Nasopharyngeal Swab  Status: None   Collection Time: 05/19/19  1:48 AM   Specimen: Nasopharyngeal Swab  Result Value Ref Range Status   SARS Coronavirus 2 by RT PCR NEGATIVE NEGATIVE Final    Comment: (NOTE) SARS-CoV-2 target nucleic acids are NOT DETECTED. The SARS-CoV-2 RNA is generally detectable in upper respiratoy specimens during the acute phase of infection. The lowest concentration of SARS-CoV-2 viral copies this assay can detect is 131 copies/mL. A negative result does not preclude SARS-Cov-2 infection and should not be used as the sole basis for treatment or other patient management decisions. A negative result may occur with  improper specimen collection/handling, submission of specimen other than nasopharyngeal swab, presence of viral mutation(s) within the areas targeted by this assay, and inadequate number of viral copies (<131 copies/mL). A negative result must be combined with clinical observations, patient history, and epidemiological information. The expected result is Negative. Fact Sheet for Patients:  PinkCheek.be Fact Sheet for Healthcare Providers:  GravelBags.it This test is not yet ap proved or cleared by the [place] FDA and  has been authorized for detection and/or diagnosis of SARS-CoV-2 by FDA under an Emergency Use Authorization (EUA). This EUA will remain  in effect (meaning this test can be used) for the duration of the COVID-19 declaration under Section 564(b)(1) of the Act, 21 U.S.C. section 360bbb-3(b)(1), unless the authorization is terminated or revoked sooner.    Influenza A by  PCR NEGATIVE NEGATIVE Final   Influenza B by PCR NEGATIVE NEGATIVE Final    Comment: (NOTE) The Xpert Xpress SARS-CoV-2/FLU/RSV assay is intended as an aid in  the diagnosis of influenza from Nasopharyngeal swab specimens and  should not be used as a sole basis for treatment. Nasal washings and  aspirates are unacceptable for Xpert Xpress SARS-CoV-2/FLU/RSV  testing. Fact Sheet for Patients: PinkCheek.be Fact Sheet for Healthcare Providers: GravelBags.it This test is not yet approved or cleared by the [place] FDA and  has been authorized for detection and/or diagnosis of SARS-CoV-2 by  FDA under an Emergency Use Authorization (EUA). This EUA will remain  in effect (meaning this test can be used) for the duration of the  Covid-19 declaration under Section 564(b)(1) of the Act, 21  U.S.C. section 360bbb-3(b)(1), unless the authorization is  terminated or revoked. Performed at [facility], [place] [address] [place], [place] [address]          Radiology Studies: No results found.      Scheduled Meds: . amphetamine-dextroamphetamine  25 mg Oral q morning - 10a  . enoxaparin (LOVENOX) injection  40 mg Subcutaneous Daily  . feeding supplement  1 Container Oral TID BM  . FLUoxetine  20 mg Oral Daily  . folic acid  1 mg Oral Daily  . insulin aspart  0-6 Units Subcutaneous TID WC  . multivitamin with minerals  1 tablet Oral Daily  . pantoprazole  40 mg Oral Daily  . predniSONE  60 mg Oral Q breakfast  . thiamine  100 mg Oral Daily   Or  . thiamine  100 mg Intravenous Daily   Continuous Infusions: . sodium chloride 125 mL/hr at 05/23/19 1809     Time spent: 20 minutes with over 50% of the time coordinating the patient's care    Harold Hedge, DO [facility] Pager [phone]  Call night coverage person covering after 7pm

## 2019-05-24 NOTE — Progress Notes (Signed)
[facility]astroenterology Progress Note  Jeffery Mcintosh 39 y.o. Jun 08, 1980  CC:  Abdominal pain, fistulizing Crohn's disease  Subjective: Patient reports feeling well today.  His abdominal pain has improved, now 6/10, which is near his baseline.  He denies nausea/vomiting.  He had one loose bowel movement yesterday without any rectal bleeding.  He is tolerating a soft diet.  ROS : Review of Systems  Constitutional: Negative for chills and fever.  Gastrointestinal: Positive for abdominal pain and diarrhea. Negative for blood in stool, constipation, heartburn, melena, nausea and vomiting.    Objective: Vital signs in last 24 hours: Vitals:   05/23/19 2241 05/24/19 0653  BP: (!) 148/79 135/76  Pulse: 63 75  Resp: 17 16  Temp: 97.6 F (36.4 C) 98.3 F (36.8 C)  SpO2: 100% 97%    Physical Exam:  General:  Alert, cooperative, no acute distress  Head:  Normocephalic, without obvious abnormality, atraumatic  Eyes:  Anicteric sclera, EOM's intact  Lungs:   Clear to auscultation bilaterally, respirations unlabored  Heart:  Regular rate and rhythm, S1, S2 normal  Abdomen:   Soft, mild diffuse tenderness, bowel sounds active all four quadrants,  no masses  Extremities: Extremities normal, atraumatic, no  edema  Pulses: 2+ and symmetric    Lab Results: Recent Labs    05/24/19 0541  NA 139  K 3.4*  CL 107  CO2 26  GLUCOSE 108*  BUN 10  CREATININE 0.58*  CALCIUM 8.2*   No results for input(s): AST, ALT, ALKPHOS, BILITOT, PROT, ALBUMIN in the last 72 hours. Recent Labs    05/24/19 0541  WBC 12.1*  HGB 11.9*  HCT 36.1*  MCV 90.7  PLT 377   No results for input(s): LABPROT, INR in the last 72 hours.    Assessment/Plan: Fistulizing Crohn's disease with exacerbation. Continue prednisone 60 mg PO qd for a total of 2 weeks, then decrease to 8m for 2 weeks, then decrease to 443mand remain on 4097mO qd until his [facility].  Advised patient to  follow-up with [facility] further management of his Crohn's disease.  He understood.  OK from GI standpoint for patient to proceed with inpatient psych admission.  [facility] will sign off. Please contact us [place] we can be of any further assistance during this hospital stay.  AliSalley Slaughter-C 05/24/2019, 10:17 AM  Contact #  [phone]

## 2019-05-24 NOTE — Consult Note (Signed)
Telepsych Consultation   Reason for Consult:  Suicide attempt Referring Physician:  Dr. Neysa Bonito  Location of Patient: [phone] Location of Provider: [facility]  Patient Identification: Jeffery Mcintosh MRN:  [id number] Principal Diagnosis: Crohn's colitis, with fistula ([place]) Diagnosis:  Principal Problem:   Crohn's colitis, with fistula ([place]) Active Problems:   Exacerbation of Crohn's disease ([place])   Bipolar 1 disorder ([employer])   Suicidal ideation   Crohn's colitis ([place])   Total Time spent with patient: 45 minutes  Subjective:   Jeffery Mcintosh is a 39 y.o. male patient admitted with Crohns flare and suicide attempt. He reports increased stressors that inlcude recent loss of his mother and grandmother in 10/2018. He reports spiraling down hill since their death. He states he reached his breaking point, with chronic pain (no treatment) and recent systemic flares that lead to him attempting suicide. He admits to two failed attempts of hanging. He reports the cord was too long, in which he adjusted it and then jumped again but this time he was met by police. He reports he jumped off the 2nd floor balcony. Today he denies suicidal ideations, when asking what has changed patient reports. " I got it out of me for now. I got it out of my system and now Im ok. " He denies any interventions that would contribute to him no longer being suicidal. He is open to coming into the hospital inpatient for stabilizaiton. " I been to those places before but I need to help myself. Only I can fix me. " He currently denies suicidal ideations, homicidal ideations and or hallucinations.    HPI:  Jeffery Mcintosh is a 39 y.o. male with history of Crohn's disease, GERD, and recent suicide attempt presenting with a chief complaint of abdominal pain.  Past Psychiatric History: [facility], currently prescribed Adderall and Prozac. Previous inpatient at [facility] for suicide attempt.  History of suicide attempts and self harm. Denies substance abuse and or legal charges.   Risk to Self: Suicidal Ideation: Yes-Currently Present Suicidal Intent: Yes-Currently Present Is patient at risk for suicide?: Yes Suicidal Plan?: Yes-Currently Present Specify Current Suicidal Plan: pt reportedly tried to hang himself Access to Means: Yes Specify Access to Suicidal Means: pt has access to rope What has been your use of drugs/alcohol within the last 12 months?: occasional alcohol  How many times?: 1 Other Self Harm Risks: depression, hx of inpt tx Triggers for Past Attempts: Other personal contacts, Unpredictable Intentional Self Injurious Behavior: None Risk to Others: Homicidal Ideation: No Thoughts of Harm to Others: No Current Homicidal Intent: No Current Homicidal Plan: No Access to Homicidal Means: No History of harm to others?: No Assessment of Violence: None Noted Does patient have access to weapons?: No Criminal Charges Pending?: No Does patient have a court date: No Prior Inpatient Therapy: Prior Inpatient Therapy: Yes Prior Therapy Dates: 2020 Prior Therapy Facilty/Provider(s): [facility] Reason for Treatment: SI Prior Outpatient Therapy: Prior Outpatient Therapy: Yes Prior Therapy Dates: upcoming appt Prior Therapy Facilty/Provider(s): [facility] Reason for Treatment: MH issues Does patient have an ACCT team?: No Does patient have Intensive In-House Services?  : No Does patient have [facility] services? : No Does patient have P4CC services?: No  Past Medical History:  Past Medical History:  Diagnosis Date  . Anxiety   . Arthritis   . Bipolar affective ([place])   . Chronic headaches   . Crohn's disease ([place]) history of  . Depression   .  GERD (gastroesophageal reflux disease)   . Hypertension   . Noncompliance 04/17/2018  . PTSD (post-traumatic stress disorder) 04/17/2018  . Schizophrenic disorder [facility])     Past Surgical History:   Procedure Laterality Date  . NO PAST SURGERIES     Family History:  Family History  Problem Relation Age of Onset  . Hypertension Mother   . Heart attack Mother   . Stroke Mother   . Alcohol abuse Father   . Mental illness Father   . Multiple sclerosis Father   . Arthritis Maternal Grandmother   . Hypertension Maternal Grandmother   . Heart disease Maternal Grandmother   . Kidney disease Maternal Grandmother   . Diabetes Maternal Grandmother   . Cancer Maternal Grandmother        great, type unknown  . Hyperlipidemia Maternal Grandfather   . Colon polyps Maternal Grandfather   . Stroke Paternal Grandmother   . Ulcerative colitis Maternal Aunt    Family Psychiatric  History: As per patient his maternal great aunt completed suicide, and extensive documented history of mental illness in his family.  Social History:  Social History   Substance and Sexual Activity  Alcohol Use No  . Alcohol/week: 0.0 standard drinks     Social History   Substance and Sexual Activity  Drug Use No    Social History   Socioeconomic History  . Marital status: Single    Spouse name: Not on file  . Number of children: 2  . Years of education: 15  . Highest education level: Not on file  Occupational History  . Occupation: disabled    Comment: Crohn's / Bipolar / Schizophrenia  Tobacco Use  . Smoking status: Former Smoker    Packs/day: 0.50    Years: 15.00    Pack years: 7.50    Types: Cigars  . Smokeless tobacco: Never Used  Substance and Sexual Activity  . Alcohol use: No    Alcohol/week: 0.0 standard drinks  . Drug use: No  . Sexual activity: Never    Birth control/protection: None  Other Topics Concern  . Not on file  Social History Narrative  . Not on file   Social Determinants of Health   Financial Resource Strain:   . Difficulty of Paying Living Expenses:   Food Insecurity:   . Worried About Charity fundraiser in the Last Year:   . Arboriculturist in the Last Year:    Transportation Needs:   . Film/video editor (Medical):   Marland Kitchen Lack of Transportation (Non-Medical):   Physical Activity:   . Days of Exercise per Week:   . Minutes of Exercise per Session:   Stress:   . Feeling of Stress :   Social Connections:   . Frequency of Communication with Friends and Family:   . Frequency of Social Gatherings with Friends and Family:   . Attends Religious Services:   . Active Member of Clubs or Organizations:   . Attends Archivist Meetings:   Marland Kitchen Marital Status:    Additional Social History:    Allergies:  No Known Allergies  Labs:  Results for orders placed or performed during the hospital encounter of 05/19/19 (from the past 48 hour(s))  Glucose, capillary     Status: Abnormal   Collection Time: 05/22/19  5:35 PM  Result Value Ref Range   Glucose-Capillary 134 (H) 70 - 99 mg/dL    Comment: Glucose reference range applies only to samples taken after fasting  for at least 8 hours.   Comment 1 Notify RN   Glucose, capillary     Status: Abnormal   Collection Time: 05/22/19  9:35 PM  Result Value Ref Range   Glucose-Capillary 154 (H) 70 - 99 mg/dL    Comment: Glucose reference range applies only to samples taken after fasting for at least 8 hours.  Hemoglobin A1c     Status: None   Collection Time: 05/23/19  5:40 AM  Result Value Ref Range   Hgb A1c MFr Bld 5.6 4.8 - 5.6 %    Comment: (NOTE) Pre diabetes:          5.7%-6.4% Diabetes:              >6.4% Glycemic control for   <7.0% adults with diabetes    Mean Plasma Glucose 114.02 mg/dL    Comment: Performed at [facility] [address], [place], [place] [address]  Glucose, capillary     Status: Abnormal   Collection Time: 05/23/19  7:39 AM  Result Value Ref Range   Glucose-Capillary 130 (H) 70 - 99 mg/dL    Comment: Glucose reference range applies only to samples taken after fasting for at least 8 hours.  Glucose, capillary     Status: Abnormal   Collection Time:  05/23/19 11:37 AM  Result Value Ref Range   Glucose-Capillary 113 (H) 70 - 99 mg/dL    Comment: Glucose reference range applies only to samples taken after fasting for at least 8 hours.  Glucose, capillary     Status: Abnormal   Collection Time: 05/23/19  4:37 PM  Result Value Ref Range   Glucose-Capillary 227 (H) 70 - 99 mg/dL    Comment: Glucose reference range applies only to samples taken after fasting for at least 8 hours.  CBC     Status: Abnormal   Collection Time: 05/24/19  5:41 AM  Result Value Ref Range   WBC 12.1 (H) 4.0 - 10.5 K/uL   RBC 3.98 (L) 4.22 - 5.81 MIL/uL   Hemoglobin 11.9 (L) 13.0 - 17.0 g/dL   HCT 36.1 (L) 39.0 - 52.0 %   MCV 90.7 80.0 - 100.0 fL   MCH 29.9 26.0 - 34.0 pg   MCHC 33.0 30.0 - 36.0 g/dL   RDW 14.3 11.5 - 15.5 %   Platelets 377 150 - 400 K/uL   nRBC 0.0 0.0 - 0.2 %    Comment: Performed at [facility] [place] [address] [place], [place] [address]  Basic metabolic panel     Status: Abnormal   Collection Time: 05/24/19  5:41 AM  Result Value Ref Range   Sodium 139 135 - 145 mmol/L   Potassium 3.4 (L) 3.5 - 5.1 mmol/L   Chloride 107 98 - 111 mmol/L   CO2 26 22 - 32 mmol/L   Glucose, Bld 108 (H) 70 - 99 mg/dL    Comment: Glucose reference range applies only to samples taken after fasting for at least 8 hours.   BUN 10 6 - 20 mg/dL   Creatinine, Ser 0.58 (L) 0.61 - 1.24 mg/dL   Calcium 8.2 (L) 8.9 - 10.3 mg/dL   GFR calc non Af Amer >60 >60 mL/min   GFR calc Af Amer >60 >60 mL/min   Anion gap 6 5 - 15    Comment: Performed at [facility], [place] [address] [place], [place] [address]  Glucose, capillary     Status: None  Collection Time: 05/24/19  7:35 AM  Result Value Ref Range   Glucose-Capillary 86 70 - 99 mg/dL    Comment: Glucose reference range applies only to samples taken after fasting for at least 8 hours.   Comment 1 Notify RN     Medications:  Current Facility-Administered Medications   Medication Dose Route Frequency Provider Last Rate Last Admin  . 0.9 %  sodium chloride infusion   Intravenous Continuous Ward, Delice Bison, DO 125 mL/hr at 05/23/19 1809 New Bag at 05/23/19 1809  . acetaminophen (TYLENOL) tablet 650 mg  650 mg Oral Q6H PRN Harold Hedge, MD      . amphetamine-dextroamphetamine (ADDERALL XR) 24 hr capsule 25 mg  25 mg Oral q morning - 10a Ward, Kristen N, DO   25 mg at 05/24/19 1046  . enoxaparin (LOVENOX) injection 40 mg  40 mg Subcutaneous Daily Domenic Polite, MD   40 mg at 05/24/19 1046  . feeding supplement (BOOST / RESOURCE BREEZE) liquid 1 Container  1 Container Oral TID BM Harold Hedge, MD   1 Container at 05/24/19 1049  . FLUoxetine (PROZAC) capsule 20 mg  20 mg Oral Daily Ward, Kristen N, DO   20 mg at 05/24/19 1046  . folic acid (FOLVITE) tablet 1 mg  1 mg Oral Daily Harold Hedge, MD   1 mg at 05/24/19 1046  . HYDROcodone-acetaminophen (NORCO) 7.5-325 MG per tablet 1-2 tablet  1-2 tablet Oral Q6H PRN Harold Hedge, MD   2 tablet at 05/24/19 [phone]  . insulin aspart (novoLOG) injection 0-6 Units  0-6 Units Subcutaneous TID WC Harold Hedge, MD   2 Units at 05/23/19 1706  . multivitamin with minerals tablet 1 tablet  1 tablet Oral Daily Harold Hedge, MD   1 tablet at 05/24/19 1046  . ondansetron (ZOFRAN) tablet 4 mg  4 mg Oral Q6H PRN Etta Quill, DO   4 mg at 05/20/19 2725   Or  . ondansetron [facility]) injection 4 mg  4 mg Intravenous Q6H PRN Etta Quill, DO   4 mg at 05/21/19 1629  . pantoprazole (PROTONIX) EC tablet 40 mg  40 mg Oral Daily Harold Hedge, MD   40 mg at 05/24/19 1046  . predniSONE (DELTASONE) tablet 60 mg  60 mg Oral Q breakfast Wilford Corner, MD   60 mg at 05/24/19 0842  . thiamine tablet 100 mg  100 mg Oral Daily Harold Hedge, MD   100 mg at 05/24/19 1046   Or  . thiamine (B-1) injection 100 mg  100 mg Intravenous Daily Harold Hedge, MD        Musculoskeletal: Strength & Muscle Tone: within normal  limits Gait & Station: normal Patient leans: N/A  Psychiatric Specialty Exam: Physical Exam  Review of Systems  Blood pressure 135/76, pulse 75, temperature 98.3 F (36.8 C), temperature source Oral, resp. rate 16, height _0  (1.6 m), weight 70 kg, SpO2 97 %.Body mass index is 27.34 kg/m.  General Appearance: Fairly Groomed  Eye Contact:  Good  Speech:  Clear and Coherent and Normal Rate  Volume:  Normal  Mood:  Euthymic  Affect:  Non-Congruent and Depressed  Thought Process:  Coherent, Linear and Descriptions of Associations: Intact  Orientation:  Full (Time, Place, and Person)  Thought Content:  Logical  Suicidal Thoughts:  No  Homicidal Thoughts:  No  Memory:  Immediate;   Fair Recent;   Fair  Judgement:  Poor  Insight:  Shallow  Psychomotor Activity:  Normal  Concentration:  Concentration: Fair and Attention Span: Fair  Recall:  [employer] of Knowledge:  Fair  Language:  Fair  Akathisia:  No  Handed:  Right  AIMS (if indicated):     Assets:  Communication Skills Desire for Improvement Financial Resources/Insurance Housing Leisure Time Social Support  ADL's:  Intact  Cognition:  WNL  Sleep:        Treatment Plan Summary: Daily contact with patient to assess and evaluate symptoms and progress in treatment, Medication management and Plan Will admit under psychiatry service at this time. Patient with increased risk factors to include depression, bipolar, and PTSD, chornic pain, and multiple suicide attempts. He reports feeling better however there have been no proven or recorded interventions that would decrease his suicidal ideations. His affect and mood are incongruent and he continues to have poor impulse control and poor insight into his mental condition. Currently on prozac only and will beenfit from medication mangement and crisis stabilzation.   Disposition: Recommend psychiatric Inpatient admission when medically cleared.  This service was provided via  telemedicine using a 2-way, interactive audio and video technology.  Names of all persons participating in this telemedicine service and their role in this encounter. Name: Jeffery Mcintosh Role: Patient  Name: Sheran Fava Role: FNP-BC, PMHNP-BC, DNP    Suella Broad, FNP 05/24/2019 11:04 AM

## 2019-05-25 ENCOUNTER — Inpatient Hospital Stay (HOSPITAL_COMMUNITY)
Admission: AD | Admit: 2019-05-25 | Discharge: 2019-05-29 | DRG: 885 | Disposition: A | Discharge status: Home / Self Care | Payer: Medicare Other | Source: Intra-hospital | Attending: Psychiatry | Admitting: Psychiatry

## 2019-05-25 ENCOUNTER — Encounter (HOSPITAL_COMMUNITY): Payer: Self-pay | Admitting: Family

## 2019-05-25 ENCOUNTER — Other Ambulatory Visit: Payer: Self-pay

## 2019-05-25 DIAGNOSIS — Z818 Family history of other mental and behavioral disorders: Secondary | ICD-10-CM | POA: Diagnosis not present

## 2019-05-25 DIAGNOSIS — F4310 Post-traumatic stress disorder, unspecified: Secondary | ICD-10-CM | POA: Diagnosis present

## 2019-05-25 DIAGNOSIS — K5090 Crohn's disease, unspecified, without complications: Secondary | ICD-10-CM | POA: Diagnosis present

## 2019-05-25 DIAGNOSIS — Z915 Personal history of self-harm: Secondary | ICD-10-CM

## 2019-05-25 DIAGNOSIS — M1990 Unspecified osteoarthritis, unspecified site: Secondary | ICD-10-CM | POA: Diagnosis present

## 2019-05-25 DIAGNOSIS — G8929 Other chronic pain: Secondary | ICD-10-CM | POA: Diagnosis present

## 2019-05-25 DIAGNOSIS — Z9119 Patient's noncompliance with other medical treatment and regimen: Secondary | ICD-10-CM | POA: Diagnosis not present

## 2019-05-25 DIAGNOSIS — R519 Headache, unspecified: Secondary | ICD-10-CM | POA: Diagnosis present

## 2019-05-25 DIAGNOSIS — Z79899 Other long term (current) drug therapy: Secondary | ICD-10-CM | POA: Diagnosis not present

## 2019-05-25 DIAGNOSIS — F332 Major depressive disorder, recurrent severe without psychotic features: Secondary | ICD-10-CM | POA: Diagnosis present

## 2019-05-25 DIAGNOSIS — F909 Attention-deficit hyperactivity disorder, unspecified type: Secondary | ICD-10-CM | POA: Diagnosis present

## 2019-05-25 DIAGNOSIS — R4587 Impulsiveness: Secondary | ICD-10-CM | POA: Diagnosis present

## 2019-05-25 DIAGNOSIS — F209 Schizophrenia, unspecified: Secondary | ICD-10-CM | POA: Diagnosis present

## 2019-05-25 DIAGNOSIS — K50113 Crohn's disease of large intestine with fistula: Secondary | ICD-10-CM | POA: Diagnosis not present

## 2019-05-25 DIAGNOSIS — I700 Atherosclerosis of aorta: Secondary | ICD-10-CM | POA: Diagnosis present

## 2019-05-25 DIAGNOSIS — I10 Essential (primary) hypertension: Secondary | ICD-10-CM | POA: Diagnosis present

## 2019-05-25 DIAGNOSIS — K219 Gastro-esophageal reflux disease without esophagitis: Secondary | ICD-10-CM | POA: Diagnosis present

## 2019-05-25 DIAGNOSIS — G4700 Insomnia, unspecified: Secondary | ICD-10-CM | POA: Diagnosis present

## 2019-05-25 DIAGNOSIS — Z87891 Personal history of nicotine dependence: Secondary | ICD-10-CM

## 2019-05-25 LAB — GLUCOSE, CAPILLARY
Glucose-Capillary: 83 mg/dL (ref 70–99)
Glucose-Capillary: 166 mg/dL — ABNORMAL HIGH (ref 70–99)
Glucose-Capillary: 131 mg/dL — ABNORMAL HIGH (ref 70–99)
Glucose-Capillary: 161 mg/dL — ABNORMAL HIGH (ref 70–99)

## 2019-05-25 MED ORDER — OXYCODONE HCL 5 MG PO TABS
10.0000 mg | ORAL_TABLET | Freq: Four times a day (QID) | ORAL | Status: DC | PRN
  Administered 2019-05-25 – 2019-05-29 (×15): 10 mg via ORAL
  Filled 2019-05-25 (×16): qty 2

## 2019-05-25 MED ORDER — HYDROCODONE-ACETAMINOPHEN 7.5-325 MG PO TABS: 1.0000 | ORAL_TABLET | Freq: Four times a day (QID) | ORAL | Status: DC | PRN

## 2019-05-25 MED ORDER — NICOTINE 21 MG/24HR TD PT24
21.0000 mg | MEDICATED_PATCH | Freq: Every day | TRANSDERMAL | Status: DC
  Administered 2019-05-26 – 2019-05-29 (×4): 21 mg via TRANSDERMAL
  Filled 2019-05-25 (×5): qty 1

## 2019-05-25 MED ORDER — MAGNESIUM HYDROXIDE 400 MG/5ML PO SUSP: 30.0000 mL | Freq: Every day | ORAL | Status: DC | PRN

## 2019-05-25 MED ORDER — INSULIN ASPART 100 UNIT/ML ~~LOC~~ SOLN
0.0000 [IU] | Freq: Three times a day (TID) | SUBCUTANEOUS | Status: DC
  Administered 2019-05-27: 1 [IU] via SUBCUTANEOUS

## 2019-05-25 MED ORDER — ACETAMINOPHEN 325 MG PO TABS: 650.0000 mg | ORAL_TABLET | Freq: Four times a day (QID) | ORAL | Status: DC | PRN

## 2019-05-25 MED ORDER — FOLIC ACID 1 MG PO TABS
1.0000 mg | ORAL_TABLET | Freq: Every day | ORAL | Status: DC
  Administered 2019-05-26 – 2019-05-29 (×4): 1 mg via ORAL
  Filled 2019-05-25 (×6): qty 1

## 2019-05-25 MED ORDER — HYDROCODONE-ACETAMINOPHEN 7.5-325 MG PO TABS: 1.0000 | ORAL_TABLET | ORAL | Status: DC | PRN

## 2019-05-25 MED ORDER — FLUOXETINE HCL 20 MG PO CAPS
20.0000 mg | ORAL_CAPSULE | Freq: Every day | ORAL | Status: DC
  Filled 2019-05-25: qty 1

## 2019-05-25 MED ORDER — ALUM & MAG HYDROXIDE-SIMETH 200-200-20 MG/5ML PO SUSP: 30.0000 mL | ORAL | Status: DC | PRN

## 2019-05-25 MED ORDER — AMPHETAMINE-DEXTROAMPHET ER 5 MG PO CP24: 25.0000 mg | ORAL_CAPSULE | Freq: Every morning | ORAL | Status: DC

## 2019-05-25 MED ORDER — PREDNISONE 50 MG PO TABS
60.0000 mg | ORAL_TABLET | Freq: Every day | ORAL | Status: DC
  Administered 2019-05-26 – 2019-05-29 (×4): 60 mg via ORAL
  Filled 2019-05-25 (×5): qty 1

## 2019-05-25 MED ORDER — NICOTINE 21 MG/24HR TD PT24
21.0000 mg | MEDICATED_PATCH | Freq: Every day | TRANSDERMAL | Status: DC
  Administered 2019-05-25: 21 mg via TRANSDERMAL
  Filled 2019-05-25 (×2): qty 1

## 2019-05-25 MED ORDER — PREDNISONE 20 MG PO TABS: ORAL_TABLET | ORAL | 0 refills | Status: DC

## 2019-05-25 MED ORDER — PANTOPRAZOLE SODIUM 40 MG PO TBEC
40.0000 mg | DELAYED_RELEASE_TABLET | Freq: Every day | ORAL | Status: DC
  Administered 2019-05-26 – 2019-05-29 (×4): 40 mg via ORAL
  Filled 2019-05-25 (×5): qty 1

## 2019-05-25 MED ORDER — ADULT MULTIVITAMIN W/MINERALS CH
1.0000 | ORAL_TABLET | Freq: Every day | ORAL | Status: DC
  Administered 2019-05-26 – 2019-05-29 (×4): 1 via ORAL
  Filled 2019-05-25 (×5): qty 1

## 2019-05-25 NOTE — Progress Notes (Signed)
Patient ID: Jeffery Mcintosh, male   DOB: [birth year], 39 y.o.   MRN: [id number] Admission Note  Pt is a 39 yo male that presents voluntarily on 05/25/2019 with worsening depression, anxiety, hopelessness, and chronic pain that culminated in the patient trying to hang themself. Pt states they have had many occurences where the patient went to the emergency room for abdominal pain. Pt states they felt hopeless after not receiving care besides pain medication and being sent back home. Pt states they have a GI appointment in July but they don't feel they can make it til then. Pt states they went home after the last time being discharged and became frustrated. Pt went to the garage, found an extension cord and tried to hang themself. Pt states they failed because the cord was too long. Pt states they have stressors of going through a divorce and their mother dying. Pt vapes tobacco. Pt denies drug/alcohol/Rx abuse. Pt has a pcp and therapist they see for medication management. Pt safe on the unit. Pt denies current si/hi/ah/vh and verbally agrees to approach staff if these become apparent or before harming themself/others while at [place] signed, skin/belongings search completed and patient oriented to unit. Patient stable at this time. Patient given the opportunity to express concerns and ask questions. Patient given toiletries. Will continue to monitor.   [facility] Assessment 05/19/19:  Jeffery Mcintosh is an 39 y.o. male who presents to the ED voluntarily. Pt reports he came to the ED "because my grandfather called the cops." TTS asked the pt why did his grandfather call the cops and he states "he wanted me out of his house." Pt is vague and apprehensive throughout the assessment and does not verbalize why he is in the ED. Pt states "if I tell you, you're just going to use it against me." Pt states he has an upcoming appointment with the [facility] today. Pt states he has been admitted to inpt  facilities in the past for suicidal ideations. Pt states his mother died in 10/30/2018 and he feels this has increased his depression. Pt states he has his own place, however he has been staying with his grandfather ever since his mother died because he does not want to be alone. Pt states his mother has also called the cops on him in the past before she passed away and when asked why he states "same reason my grandfather did." Pt provides consent for TTS to speak with his grandfather. Pt's grandfather reports the pt has been suicidal and tried to jump off of a banister in his home. Pt reportedly tied a rope around his neck and tried to jump from the banister to hang himself but the rope was too long.  Pt is alert and oriented during the assessment. Pt states he has been awake for several days. Pt states he sometimes goes days without sleeping. Pt's attitude is apprehensive and demeanor is abrasive. Pt is not wearing a shirt during the assessment. Pt has appropriate eye contact and speech is logical and coherent. Pt denies HI and denies AVH at present.

## 2019-05-25 NOTE — Discharge Summary (Signed)
Physician Discharge Summary  Jeffery Mcintosh AXE:[id number] DOB: 1981/01/03 DOA: 05/19/2019  PCP: Patient, No Pcp Per  Admit date: 05/19/2019 Discharge date: 05/25/2019  Admitted From: Home Disposition: Inpatient psych  Recommendations for Outpatient Follow-up:  1. Transfer to inpatient psychiatry 2. Follow-up outpatient gastroenterology at [facility] in about 8 weeks 3. Prednisone 60 mg daily for 2 weeks followed by 50 mg for 2 weeks thereafter continue 40 mg daily until seen by outpatient gastroenterology at [facility].   Discharge Condition: Stable CODE STATUS: Full code Diet recommendation: Regular  Brief/Interim Summary: 39 year old male with past medical history of depression and bipolar disorder with multiple suicide attempts, Crohn's disease, GERD who was admitted on 4/15 after he had a suicide attempt at home and found to be in Crohn's flare on admission.  Both psych and GI have been consulted.  Psychiatry recommended inpatient psych admission.  Patient was conservatively managed and was recommended long taper course of prednisone mentioned above.  Medically stable for discharge today.  1. Crohn's disease exacerbation, improved Medically stable for discharge CT on 4/14 showing active inflammation involving distal/terminal ileum and adjacent cecum with areas of fistulous between small bowel loops and no drainable abscess which has progressed since 01/04/2019 scan. Prednisone 60 mg daily for 2 weeks followed by 50 mg daily for 2 weeks followed by 40 mg daily until seen by outpatient gastroenterology at [facility] follow-up with their service in about 8 weeks  2. Concern for low-grade partial SBO resolved  3. Depression with suicide attempt with history of prior attempts Resume home medications, inpatient psychiatry admission  4. GERD Continue PPI  5. Normocytic anemia Signs of bleeding, monitor  6. Steroid induced leukocytosis  7. Hyperglycemia Very  sensitive sliding scale HA1c 5.6  8. ADHD On Adderall    Discharge Diagnoses:  Principal Problem:   Crohn's colitis, with fistula ([place]) Active Problems:   Exacerbation of Crohn's disease ([place])   Bipolar 1 disorder ([place])   Suicidal ideation   Crohn's colitis ([place])    Consultations:  Gastroenterology  Subjective: Feels okay no complaints.  Discharge Exam: Vitals:   05/25/19 1153 05/25/19 1156  BP: 140/82 103/69  Pulse: 72 67  Resp: 18 16  Temp: 97.6 F (36.4 C) 98.1 F (36.7 C)  SpO2: 98% 96%   Vitals:   05/24/19 2138 05/25/19 0552 05/25/19 1153 05/25/19 1156  BP: (!) 148/78 (!) 146/83 140/82 103/69  Pulse: 63 60 72 67  Resp: 16 16 18 16   Temp: 98.1 F (36.7 C) 98 F (36.7 C) 97.6 F (36.4 C) 98.1 F (36.7 C)  TempSrc: Oral Oral Oral Oral  SpO2: 98% 99% 98% 96%  Weight:      Height:        General: Pt is alert, awake, not in acute distress Cardiovascular: RRR, S1/S2 +, no rubs, no gallops Respiratory: CTA bilaterally, no wheezing, no rhonchi Abdominal: Soft, NT, ND, bowel sounds + Extremities: no edema, no cyanosis  Discharge Instructions  Discharge Instructions    Discharge patient   Complete by: As directed    Discharge disposition: 65-Discharged/transferred to [facility] or [facility]   Discharge patient date: 05/25/2019     Allergies as of 05/25/2019   No Known Allergies     Medication List    TAKE these medications   amphetamine-dextroamphetamine 25 MG 24 hr capsule Commonly known as: ADDERALL XR Take 25 mg by mouth every morning.   FLUoxetine 10 MG capsule Commonly known as:  PROZAC Take 20 mg by mouth daily.   multivitamin with minerals Tabs tablet Take 1 tablet by mouth daily.   predniSONE 20 MG tablet Commonly known as: Deltasone Take 3 tablets (60 mg total) by mouth daily with breakfast for 14 days, THEN 2.5 tablets (50 mg total) daily with breakfast for 14 days, THEN 2 tablets (40  mg total) daily with breakfast for 21 days. Start taking on: May 25, 2019 What changed:   medication strength  See the new instructions.       No Known Allergies  You were cared for by a hospitalist during your hospital stay. If you have any questions about your discharge medications or the care you received while you were in the hospital after you are discharged, you can call the unit and asked to speak with the hospitalist on call if the hospitalist that took care of you is not available. Once you are discharged, your primary care physician will handle any further medical issues. Please note that no refills for any discharge medications will be authorized once you are discharged, as it is imperative that you return to your primary care physician (or establish a relationship with a primary care physician if you do not have one) for your aftercare needs so that they can reassess your need for medications and monitor your lab values.   Procedures/Studies: DG Abd 1 View  Result Date: 05/20/2019 CLINICAL DATA:  Abdominal pain and distention. EXAM: ABDOMEN - 1 VIEW COMPARISON:  April 20, 2018. FINDINGS: No abnormal bowel dilatation is noted. Mild amount of stool seen throughout the colon. No radio-opaque calculi or other significant radiographic abnormality are seen. IMPRESSION: Mild stool burden. No evidence of bowel obstruction or ileus. Electronically Signed   By: Marijo Conception M.D.   On: 05/20/2019 14:48   CT Abdomen Pelvis W Contrast  Result Date: 05/18/2019 CLINICAL DATA:  Right lower quadrant pain for 2 weeks. History of Crohn disease. EXAM: CT ABDOMEN AND PELVIS WITH CONTRAST TECHNIQUE: Multidetector CT imaging of the abdomen and pelvis was performed using the standard protocol following bolus administration of intravenous contrast. CONTRAST:  129m OMNIPAQUE IOHEXOL 300 MG/ML  SOLN COMPARISON:  11/29/2018 FINDINGS: Lower chest: Clear lung bases. Normal heart size without pericardial  or pleural effusion. Hepatobiliary: Normal liver. Normal gallbladder, without biliary ductal dilatation. Pancreas: Normal, without mass or ductal dilatation. Spleen: Normal in size, without focal abnormality. Adrenals/Urinary Tract: Normal adrenal glands. Normal kidneys, without hydronephrosis. Normal urinary bladder. Stomach/Bowel: Normal stomach, without wall thickening. Moderate to marked inflammation involves the distal and terminal ileum, slightly increased compared to 01/04/2019. Progressive presumably secondary involvement of the cecum. Example 51/3. Fistulous are again identified between the terminal and distal ileum, including on this image. No surrounding abscess. The appendix is at least partially visualized including on 51/3 and uninvolved. No free intraperitoneal air. Small bowel loops just proximal to the inflammation measure up to 2.9 cm. Apparent wall thickening of mid small bowel loops including on 44/3 may be due to underdistention. Vascular/Lymphatic: Aortic atherosclerosis. No abdominopelvic adenopathy. Reproductive: Normal prostate. Other: No significant free fluid. Musculoskeletal: No sacroiliitis. IMPRESSION: 1. Active inflammation/Crohn disease involving the distal/terminal ileum and adjacent cecum. Progressive compared to 01/04/2019. Areas of fistulous communication between small bowel loops again identified. No drainable abscess. 2. Small bowel just proximal to the inflammation measures up to 2.9 cm. Cannot exclude low-grade partial small-bowel obstruction. 3. Mid small bowel loops appear thick walled, but are underdistended. Equivocal for more proximal Crohn disease. 4.  Aortic Atherosclerosis (ICD10-I70.0). Electronically Signed   By: Abigail Miyamoto M.D.   On: 05/18/2019 09:45   DG Humerus Right  Result Date: 05/19/2019 CLINICAL DATA:  [address] off balcony EXAM: RIGHT HUMERUS - 2+ VIEW COMPARISON:  None. FINDINGS: There is no evidence of fracture or other focal bone lesions. Soft tissues  are unremarkable. IMPRESSION: Negative. Electronically Signed   By: Donavan Foil M.D.   On: 05/19/2019 01:38      The results of significant diagnostics from this hospitalization (including imaging, microbiology, ancillary and laboratory) are listed below for reference.     Microbiology: Recent Results (from the past 240 hour(s))  Respiratory Panel by RT PCR (Flu A&B, Covid) - Nasopharyngeal Swab     Status: None   Collection Time: 05/19/19  1:48 AM   Specimen: Nasopharyngeal Swab  Result Value Ref Range Status   SARS Coronavirus 2 by RT PCR NEGATIVE NEGATIVE Final    Comment: (NOTE) SARS-CoV-2 target nucleic acids are NOT DETECTED. The SARS-CoV-2 RNA is generally detectable in upper respiratoy specimens during the acute phase of infection. The lowest concentration of SARS-CoV-2 viral copies this assay can detect is 131 copies/mL. A negative result does not preclude SARS-Cov-2 infection and should not be used as the sole basis for treatment or other patient management decisions. A negative result may occur with  improper specimen collection/handling, submission of specimen other than nasopharyngeal swab, presence of viral mutation(s) within the areas targeted by this assay, and inadequate number of viral copies (<131 copies/mL). A negative result must be combined with clinical observations, patient history, and epidemiological information. The expected result is Negative. Fact Sheet for Patients:  PinkCheek.be Fact Sheet for Healthcare Providers:  GravelBags.it This test is not yet ap proved or cleared by the [place] FDA and  has been authorized for detection and/or diagnosis of SARS-CoV-2 by FDA under an Emergency Use Authorization (EUA). This EUA will remain  in effect (meaning this test can be used) for the duration of the COVID-19 declaration under Section 564(b)(1) of the Act, 21 U.S.C. section 360bbb-3(b)(1),  unless the authorization is terminated or revoked sooner.    Influenza A by PCR NEGATIVE NEGATIVE Final   Influenza B by PCR NEGATIVE NEGATIVE Final    Comment: (NOTE) The Xpert Xpress SARS-CoV-2/FLU/RSV assay is intended as an aid in  the diagnosis of influenza from Nasopharyngeal swab specimens and  should not be used as a sole basis for treatment. Nasal washings and  aspirates are unacceptable for Xpert Xpress SARS-CoV-2/FLU/RSV  testing. Fact Sheet for Patients: PinkCheek.be Fact Sheet for Healthcare Providers: GravelBags.it This test is not yet approved or cleared by the [place] FDA and  has been authorized for detection and/or diagnosis of SARS-CoV-2 by  FDA under an Emergency Use Authorization (EUA). This EUA will remain  in effect (meaning this test can be used) for the duration of the  Covid-19 declaration under Section 564(b)(1) of the Act, 21  U.S.C. section 360bbb-3(b)(1), unless the authorization is  terminated or revoked. Performed at [facility], [place] [address], [place], [place] [address]      Labs: BNP (last 3 results) No results for input(s): BNP in the last 8760 hours. Basic Metabolic Panel: Recent Labs  Lab 05/19/19 0149 05/19/19 0833 05/20/19 0508 05/21/19 0520 05/24/19 0541  NA 148* 142 141 139 139  K 4.1 4.3 4.2 4.3 3.4*  CL 107 106 108 107 107  CO2 28 27 25 24 26   GLUCOSE 85 114* 137*  207* 108*  BUN 8 12 9 9 10   CREATININE 0.84 0.78 0.73 0.70 0.58*  CALCIUM 9.1 8.4* 8.6* 8.6* 8.2*  MG  --  2.0  --   --   --   PHOS  --  3.1  --   --   --    Liver Function Tests: Recent Labs  Lab 05/19/19 0149 05/19/19 0833  AST 23 31  ALT 20 24  ALKPHOS 90 87  BILITOT 0.3 0.3  PROT 7.1 6.4*  ALBUMIN 3.7 3.4*   No results for input(s): LIPASE, AMYLASE in the last 168 hours. No results for input(s): AMMONIA in the last 168 hours. CBC: Recent Labs  Lab 05/19/19 0149  05/19/19 0833 05/20/19 0508 05/21/19 0520 05/24/19 0541  WBC 9.2 13.9* 14.6* 12.6* 12.1*  NEUTROABS 6.6  --   --   --   --   HGB 14.0 12.8* 12.1* 12.4* 11.9*  HCT 41.7 37.9* 35.7* 37.2* 36.1*  MCV 89.9 90.0 88.4 90.5 90.7  PLT 467* 466* 409* 441* 377   Cardiac Enzymes: No results for input(s): CKTOTAL, CKMB, CKMBINDEX, TROPONINI in the last 168 hours. BNP: Invalid input(s): POCBNP CBG: Recent Labs  Lab 05/24/19 1232 05/24/19 1649 05/24/19 2135 05/25/19 0740 05/25/19 1151  GLUCAP 156* 161* 129* 83 166*   D-Dimer No results for input(s): DDIMER in the last 72 hours. Hgb A1c Recent Labs    05/23/19 0540  HGBA1C 5.6   Lipid Profile No results for input(s): CHOL, HDL, LDLCALC, TRIG, CHOLHDL, LDLDIRECT in the last 72 hours. Thyroid function studies No results for input(s): TSH, T4TOTAL, T3FREE, THYROIDAB in the last 72 hours.  Invalid input(s): FREET3 Anemia work up No results for input(s): VITAMINB12, FOLATE, FERRITIN, TIBC, IRON, RETICCTPCT in the last 72 hours. Urinalysis    Component Value Date/Time   COLORURINE YELLOW 05/18/2019 0140   APPEARANCEUR CLEAR 05/18/2019 0140   LABSPEC 1.014 05/18/2019 0140   PHURINE 5.0 05/18/2019 0140   GLUCOSEU NEGATIVE 05/18/2019 0140   GLUCOSEU NEGATIVE 04/18/2013 1418   HGBUR NEGATIVE 05/18/2019 0140   BILIRUBINUR NEGATIVE 05/18/2019 0140   KETONESUR NEGATIVE 05/18/2019 0140   PROTEINUR NEGATIVE 05/18/2019 0140   UROBILINOGEN 1.0 11/16/2014 1719   NITRITE NEGATIVE 05/18/2019 0140   LEUKOCYTESUR NEGATIVE 05/18/2019 0140   Sepsis Labs Invalid input(s): PROCALCITONIN,  WBC,  LACTICIDVEN Microbiology Recent Results (from the past 240 hour(s))  Respiratory Panel by RT PCR (Flu A&B, Covid) - Nasopharyngeal Swab     Status: None   Collection Time: 05/19/19  1:48 AM   Specimen: Nasopharyngeal Swab  Result Value Ref Range Status   SARS Coronavirus 2 by RT PCR NEGATIVE NEGATIVE Final    Comment: (NOTE) SARS-CoV-2 target  nucleic acids are NOT DETECTED. The SARS-CoV-2 RNA is generally detectable in upper respiratoy specimens during the acute phase of infection. The lowest concentration of SARS-CoV-2 viral copies this assay can detect is 131 copies/mL. A negative result does not preclude SARS-Cov-2 infection and should not be used as the sole basis for treatment or other patient management decisions. A negative result may occur with  improper specimen collection/handling, submission of specimen other than nasopharyngeal swab, presence of viral mutation(s) within the areas targeted by this assay, and inadequate number of viral copies (<131 copies/mL). A negative result must be combined with clinical observations, patient history, and epidemiological information. The expected result is Negative. Fact Sheet for Patients:  PinkCheek.be Fact Sheet for Healthcare Providers:  GravelBags.it This test is not yet ap proved or cleared  by the [place] and  has been authorized for detection and/or diagnosis of SARS-CoV-2 by FDA under an Emergency Use Authorization (EUA). This EUA will remain  in effect (meaning this test can be used) for the duration of the COVID-19 declaration under Section 564(b)(1) of the Act, 21 U.S.C. section 360bbb-3(b)(1), unless the authorization is terminated or revoked sooner.    Influenza A by PCR NEGATIVE NEGATIVE Final   Influenza B by PCR NEGATIVE NEGATIVE Final    Comment: (NOTE) The Xpert Xpress SARS-CoV-2/FLU/RSV assay is intended as an aid in  the diagnosis of influenza from Nasopharyngeal swab specimens and  should not be used as a sole basis for treatment. Nasal washings and  aspirates are unacceptable for Xpert Xpress SARS-CoV-2/FLU/RSV  testing. Fact Sheet for Patients: PinkCheek.be Fact Sheet for Healthcare Providers: GravelBags.it This test is not  yet approved or cleared by the [place] FDA and  has been authorized for detection and/or diagnosis of SARS-CoV-2 by  FDA under an Emergency Use Authorization (EUA). This EUA will remain  in effect (meaning this test can be used) for the duration of the  Covid-19 declaration under Section 564(b)(1) of the Act, 21  U.S.C. section 360bbb-3(b)(1), unless the authorization is  terminated or revoked. Performed at [facility], [place] [address] [place], [place] [address]      Time coordinating discharge:  I have spent 35 minutes face to face with the patient and on the ward discussing the patients care, assessment, plan and disposition with other care givers. >50% of the time was devoted counseling the patient about the risks and benefits of treatment/Discharge disposition and coordinating care.   SIGNED:   Damita Lack, MD  [facility] 05/25/2019, 12:41 PM   If 7PM-7AM, please contact night-coverage

## 2019-05-25 NOTE — Progress Notes (Signed)
[facility] Notes:  (Nursing/MHT/Case Management/Adjunct)  Date:  05/25/2019  Time:  2030  Type of Therapy:  wrap up group  Participation Level:  Minimal  Participation Quality:  Attentive and Supportive  Affect:  Resistant  Cognitive:  Appropriate  Insight:  Improving  Engagement in Group:  Lacking  Modes of Intervention:  Clarification, Education and Support  Summary of Progress/Problems: Positive thinking and positive change were discussed.   Shellia Cleverly 05/25/2019, 9:25 PM

## 2019-05-25 NOTE — TOC Initial Note (Addendum)
Transition of Care [facility]) - Initial/Assessment Note    Patient Details  Name: Jeffery Mcintosh MRN: [id number] Date of Birth: [birth year]  Transition of [facility]) CM/SW Contact:    Lynnell Catalan, RN Phone Number: 05/25/2019, 9:36 AM  Clinical Narrative:                 Per Psych eval pt needs inpatient psych bed at dc. Pt IVC'd in the community on 4/15.  Both [facility] and [facility] contacted for bed availability. TOC will continue to follow.            Expected Discharge Date: 05/25/19                                    Prior Living Arrangements/Services                       Activities of Daily Living Home Assistive Devices/Equipment: None ADL Screening (condition at time of admission) Patient's cognitive ability adequate to safely complete daily activities?: Yes Is the patient deaf or have difficulty hearing?: No Does the patient have difficulty seeing, even when wearing glasses/contacts?: No Does the patient have difficulty concentrating, remembering, or making decisions?: No Patient able to express need for assistance with ADLs?: Yes Does the patient have difficulty dressing or bathing?: No Independently performs ADLs?: Yes (appropriate for developmental age) Does the patient have difficulty walking or climbing stairs?: No Weakness of Legs: None Weakness of Arms/Hands: None  Permission Sought/Granted                  Emotional Assessment              Admission diagnosis:  Suicide attempt ([place]) [T14.91XA] Involuntary commitment [Z04.6] Crohn's colitis, with fistula ([place]) [K50.113] Crohn's colitis ([place]) [K50.10] Patient Active Problem List   Diagnosis Date Noted  . Crohn's colitis ([place]) 05/20/2019  . Suicidal ideation 05/19/2019  . Crohn's colitis, with fistula ([place]) 05/19/2019  . Crohn's colitis, with intestinal obstruction ([place]) 10/11/2018  . Crohn's disease of colon with intestinal obstruction ([place])   . Noncompliance  04/17/2018  . PTSD (post-traumatic stress disorder) 04/17/2018  . Crohn's disease of jejunum with intestinal obstruction ([employer]) 10/26/2017  . SBO (small bowel obstruction) ([place]) 10/26/2017  . Bipolar 1 disorder ([place]) 10/26/2017  . Crohn's colitis, unspecified complication ([place]) 32/44/0102  . Epigastric burning sensation 01/03/2015  . Numbness and tingling in left arm 11/15/2014  . Exacerbation of Crohn's disease ([place]) 10/21/2014  . Crohn's disease ([place]) 10/21/2014  . Diarrhea 10/21/2014  . Abdominal pain   . Gout 04/18/2013  . Cubital tunnel syndrome 04/18/2013  . Abnormal urinalysis 04/18/2013   PCP:  Patient, No Pcp Per Pharmacy:   [id number] [place] [id number]: [phone]: [phone]    Social Determinants of Health (SDOH) Interventions    Readmission Risk Interventions No flowsheet data found.

## 2019-05-25 NOTE — Progress Notes (Signed)
Pt accepted to [facility]; bed 306-2   Shuvon Rankin, NP is the accepting provider.    Dr. Mallie Darting is the attending provider.    Call report to [phone]    Alinda Sierras @ [facility] notified.     Pt is voluntary and will be transported by [facility]  Pt is scheduled to arrive at [facility] at [place], [place], [place] Disposition [facility]/TTS [phone]

## 2019-05-25 NOTE — Progress Notes (Signed)
   05/25/19 2203  Psych Admission Type (Psych Patients Only)  Admission Status Involuntary  Psychosocial Assessment  Patient Complaints Anxiety  Eye Contact Fair  Facial Expression Anxious;Pained;Pensive;Sullen;Sad;Worried  Affect Anxious;Appropriate to circumstance;Preoccupied;Sad;Sullen  Speech Logical/coherent  Interaction Assertive  Motor Activity Slow  Appearance/Hygiene Unremarkable;In scrubs  Behavior Characteristics Cooperative;Anxious  Mood Depressed;Anxious  Thought Process  Coherency WDL  Content Ambivalence  Delusions None reported or observed  Perception WDL  Hallucination None reported or observed  Judgment Poor  Confusion Mild  Danger to Self  Current suicidal ideation? Denies  Danger to Others  Danger to Others None reported or observed   Pt adamant that he was not given the full dose of his pain medication earlier. "I'm supposed to get 15 mg and they only gave me 10." Pt told that the order from the provider was written for 10 mg of oxycodone and not 15 mg. And, that it is given every 6 hours. His next dose isn't due until midnight. Pt thought this over and said that he would be back at midnight. States that he is in a lot of pain from his Crohn's disease.

## 2019-05-25 NOTE — TOC Transition Note (Signed)
Transition of Care [facility]) - CM/SW Discharge Note   Patient Details  Name: Jeffery Mcintosh MRN: [id number] Date of Birth: [birth year]  [facility]) CM/SW Contact:  Lynnell Catalan, RN Phone Number: 05/25/2019, 2:20 PM   Clinical Narrative:     [facility] bed available for pt today. [facility] non emergency line contacted for [facility] transport to [facility]. Copy of IVC on front of shadow chart for transport. RN has called report to [facility].

## 2019-05-25 NOTE — Tx Team (Signed)
Initial Treatment Plan 05/25/2019 4:16 PM Jeffery Mcintosh GOK:[id number]    PATIENT STRESSORS: Health problems Marital or family conflict Traumatic event   PATIENT STRENGTHS: Ability for insight Average or above average intelligence Capable of independent living Motivation for treatment/growth Physical Health Supportive family/friends   PATIENT IDENTIFIED PROBLEMS: anxiety  depression  Chronic pain  hopelessness  Suicide attempt             DISCHARGE CRITERIA:  Ability to meet basic life and health needs Improved stabilization in mood, thinking, and/or behavior Medical problems require only outpatient monitoring Motivation to continue treatment in a less acute level of care  PRELIMINARY DISCHARGE PLAN: Attend PHP/IOP Outpatient therapy Return to previous living arrangement Return to previous work or school arrangements  PATIENT/FAMILY INVOLVEMENT: This treatment plan has been presented to and reviewed with the patient, Jeffery Mcintosh.  The patient and family have been given the opportunity to ask questions and make suggestions.  Baron Sane, RN 05/25/2019, 4:16 PM

## 2019-05-25 NOTE — Progress Notes (Signed)
Report given to [facility].

## 2019-05-26 DIAGNOSIS — F332 Major depressive disorder, recurrent severe without psychotic features: Principal | ICD-10-CM

## 2019-05-26 LAB — GLUCOSE, CAPILLARY
Glucose-Capillary: 81 mg/dL (ref 70–99)
Glucose-Capillary: 172 mg/dL — ABNORMAL HIGH (ref 70–99)
Glucose-Capillary: 116 mg/dL — ABNORMAL HIGH (ref 70–99)
Glucose-Capillary: 112 mg/dL — ABNORMAL HIGH (ref 70–99)

## 2019-05-26 MED ORDER — CARBAMAZEPINE 100 MG PO CHEW
100.0000 mg | CHEWABLE_TABLET | Freq: Two times a day (BID) | ORAL | Status: DC
  Administered 2019-05-26 – 2019-05-29 (×7): 100 mg via ORAL
  Filled 2019-05-26 (×9): qty 1

## 2019-05-26 MED ORDER — FLUOXETINE HCL 20 MG PO CAPS
40.0000 mg | ORAL_CAPSULE | Freq: Every day | ORAL | Status: DC
  Administered 2019-05-26 – 2019-05-29 (×4): 40 mg via ORAL
  Filled 2019-05-26 (×6): qty 2

## 2019-05-26 NOTE — [facility] Suicide Risk Assessment (Cosign Needed)
[facility] INPATIENT:  Family/Significant Other Suicide Prevention Education  Suicide Prevention Education:  Patient Refusal for Family/Significant Other Suicide Prevention Education: The patient Jeffery Mcintosh has refused to provide written consent for family/significant other to be provided Family/Significant Other Suicide Prevention Education during admission and/or prior to discharge.  Physician notified.  Billey Chang 05/26/2019, 11:03 AM
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
## 2019-05-26 NOTE — Progress Notes (Signed)
   05/26/19 1047  Psych Admission Type (Psych Patients Only)  Admission Status Involuntary  Psychosocial Assessment  Patient Complaints Anxiety  Eye Contact Fair  Facial Expression Anxious;Pained;Pensive;Sullen;Sad;Worried  Affect Anxious;Appropriate to circumstance;Preoccupied;Sad;Sullen  Speech Logical/coherent  Interaction Assertive  Motor Activity Slow  Appearance/Hygiene Unremarkable;In scrubs  Behavior Characteristics Cooperative  Mood Depressed  Thought Process  Coherency WDL  Content Ambivalence  Delusions None reported or observed  Perception WDL  Hallucination None reported or observed  Judgment Poor  Confusion Mild  Danger to Self  Current suicidal ideation? Denies  Danger to Others  Danger to Others None reported or observed

## 2019-05-26 NOTE — Progress Notes (Signed)
Pt states that his pain from his Crohn's has not improved. Still a 10/10 in severity. Given another dose of Oxycodone 10 mg. Will continue to monitor.
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
## 2019-05-27 LAB — GLUCOSE, CAPILLARY
Glucose-Capillary: 75 mg/dL (ref 70–99)
Glucose-Capillary: 129 mg/dL — ABNORMAL HIGH (ref 70–99)
Glucose-Capillary: 152 mg/dL — ABNORMAL HIGH (ref 70–99)
Glucose-Capillary: 121 mg/dL — ABNORMAL HIGH (ref 70–99)

## 2019-05-27 MED ORDER — ENSURE ENLIVE PO LIQD
237.0000 mL | Freq: Two times a day (BID) | ORAL | Status: DC
  Administered 2019-05-28 (×2): 237 mL via ORAL

## 2019-05-27 NOTE — Progress Notes (Signed)
Pt seen at med window this morning. Pt pain level 7/10. Oxycodone 10 mg given for pain. Will continue to monitor.

## 2019-05-27 NOTE — Progress Notes (Addendum)
D:  Patient's self inventory sheet, patient has poor sleep, no sleep medication.  Fair appetite, normal energy level, good concentration.  Denied anxiety, depression and hopeless.  Denied withdarwals.  Denied SI.  Physical problems, burning inflammation in stomach.  Worst pain #7 in past 24 hours.  Pain medicine is helpful.  Goal is live stress free.  Plans to relax.  Would like to be discharged on Sunday If not, will I be able to file weekly certification for unemployment on Sunday?   A:  Medications administered per MD orders.  Emotional support and encouragement given patient. R:  Patient denied SI and HI, contracts for safety.;  Denied A/V hallucinations.  15 minute checkss completed.

## 2019-05-27 NOTE — Progress Notes (Signed)
[facility] MD Progress Note  05/27/2019 2:20 PM Jeffery Mcintosh  MRN:  [id number]  Subjective: Jeffery Mcintosh reports, "I'm good, doing better by the day. I recently had a hopeless moment due to this Crohn' disease. The pain associated with this is unbearable. It is a constant thing. I got tired of it, tried to hang myself, but was unsuccessful, thank God".  Objective: Patient is a 39 year old male with a past psychiatric history reportedly for bipolar disorder, attention deficit hyperactivity disorder, posttraumatic stress disorder and a past medical history significant for Crohn's disease who originally presented to the [facility] emergency department on 05/19/2019 after an attempted suicide attempt by hanging. The patient stated that he had been feeling suicidal for some time. He apparently has had several suicide attempts in the past, but denied that to me. His family found him trying to hang himself, and called the police. When police arrived he had placed the noose around his neck. After they loosen the noose he jumped off a second floor balcony to a carpeted patio below. He landed on his feet and did not hit his head. He was taken to the emergency department. The patient stated that he has been quite frustrated over his Crohn's disease. Jeffery Mcintosh is seen, chart reviewed. The chart findings discussed with the treatment team. He presents alert, oriented x 4. He is visible on unit, attending group sessions. He says he is doing well today, better than when he came to the hospital. He says he came to to the hospital after he attempted to hang himself due to an ongoing unbearable pain associated with Crohn's disease. He says he is glad he was unsuccessful in hanging himself. He says his depression is getting better. He denies any anxiety issues. He denies any SIHI, AVH, delusional thoughts or paranoia. He does not appear to be responding to any internal stimuli. He is sleeping well. Jeffery Mcintosh is  in agreement to continue his current plan of care as already in progress.  Principal Problem: MDD (major depressive disorder), recurrent severe, without psychosis ([place])  Diagnosis: Principal Problem:   MDD (major depressive disorder), recurrent severe, without psychosis ([place])  Total Time spent with patient: 25 minutes  Past Psychiatric History: See H&P  Past Medical History:  Past Medical History:  Diagnosis Date  . Anxiety   . Arthritis   . Bipolar affective ([place])   . Chronic headaches   . Crohn's disease ([place]) history of  . Depression   . GERD (gastroesophageal reflux disease)   . Hypertension   . Noncompliance 04/17/2018  . PTSD (post-traumatic stress disorder) 04/17/2018  . Schizophrenic disorder [facility])     Past Surgical History:  Procedure Laterality Date  . NO PAST SURGERIES     Family History:  Family History  Problem Relation Age of Onset  . Hypertension Mother   . Heart attack Mother   . Stroke Mother   . Alcohol abuse Father   . Mental illness Father   . Multiple sclerosis Father   . Arthritis Maternal Grandmother   . Hypertension Maternal Grandmother   . Heart disease Maternal Grandmother   . Kidney disease Maternal Grandmother   . Diabetes Maternal Grandmother   . Cancer Maternal Grandmother        great, type unknown  . Hyperlipidemia Maternal Grandfather   . Colon polyps Maternal Grandfather   . Stroke Paternal Grandmother   . Ulcerative colitis Maternal Aunt    Family Psychiatric  History: See H&P  Social History:  Social History   Substance and Sexual Activity  Alcohol Use No  . Alcohol/week: 0.0 standard drinks     Social History   Substance and Sexual Activity  Drug Use No    Social History   Socioeconomic History  . Marital status: Single    Spouse name: Not on file  . Number of children: 2  . Years of education: 14  . Highest education level: Not on file  Occupational History  . Occupation: disabled    Comment: Crohn's /  Bipolar / Schizophrenia  Tobacco Use  . Smoking status: Former Smoker    Packs/day: 0.50    Years: 15.00    Pack years: 7.50    Types: Cigars  . Smokeless tobacco: Never Used  Substance and Sexual Activity  . Alcohol use: No    Alcohol/week: 0.0 standard drinks  . Drug use: No  . Sexual activity: Not Currently    Birth control/protection: None  Other Topics Concern  . Not on file  Social History Narrative  . Not on file   Social Determinants of Health   Financial Resource Strain:   . Difficulty of Paying Living Expenses:   Food Insecurity:   . Worried About Charity fundraiser in the Last Year:   . Arboriculturist in the Last Year:   Transportation Needs:   . Film/video editor (Medical):   Marland Kitchen Lack of Transportation (Non-Medical):   Physical Activity:   . Days of Exercise per Week:   . Minutes of Exercise per Session:   Stress:   . Feeling of Stress :   Social Connections:   . Frequency of Communication with Friends and Family:   . Frequency of Social Gatherings with Friends and Family:   . Attends Religious Services:   . Active Member of Clubs or Organizations:   . Attends Archivist Meetings:   Marland Kitchen Marital Status:    Additional Social History:   Sleep: Good  Appetite:  Good  Current Medications: Current Facility-Administered Medications  Medication Dose Route Frequency Provider Last Rate Last Admin  . alum & mag hydroxide-simeth (MAALOX/MYLANTA) 200-200-20 MG/5ML suspension 30 mL  30 mL Oral Q4H PRN Jeffery Covert, MD      . carbamazepine (TEGRETOL) chewable tablet 100 mg  100 mg Oral BID Jeffery Covert, MD   100 mg at 05/27/19 0751  . FLUoxetine (PROZAC) capsule 40 mg  40 mg Oral Daily Jeffery Covert, MD   40 mg at 05/27/19 0750  . folic acid (FOLVITE) tablet 1 mg  1 mg Oral Daily Jeffery Covert, MD   1 mg at 05/27/19 0751  . insulin aspart (novoLOG) injection 0-6 Units  0-6 Units Subcutaneous TID WC Jeffery Covert, MD      .  magnesium hydroxide (MILK OF MAGNESIA) suspension 30 mL  30 mL Oral Daily PRN Jeffery Covert, MD      . multivitamin with minerals tablet 1 tablet  1 tablet Oral Daily Jeffery Covert, MD   1 tablet at 05/27/19 0750  . nicotine (NICODERM CQ - dosed in mg/24 hours) patch 21 mg  21 mg Transdermal Daily Jeffery Covert, MD   21 mg at 05/27/19 0752  . oxyCODONE (Oxy IR/ROXICODONE) immediate release tablet 10 mg  10 mg Oral Q6H PRN Jeffery Covert, MD   10 mg at 05/27/19 1237  . pantoprazole (PROTONIX) EC tablet 40 mg  40 mg Oral Daily Mallie Darting, Cordie Grice, MD  40 mg at 05/27/19 0751  . predniSONE (DELTASONE) tablet 60 mg  60 mg Oral Q breakfast Jeffery Covert, MD   60 mg at 05/27/19 4008   Lab Results:  Results for orders placed or performed during the hospital encounter of 05/25/19 (from the past 48 hour(s))  Glucose, capillary     Status: Abnormal   Collection Time: 05/25/19  4:50 PM  Result Value Ref Range   Glucose-Capillary 131 (H) 70 - 99 mg/dL    Comment: Glucose reference range applies only to samples taken after fasting for at least 8 hours.   Comment 1 Notify RN    Comment 2 Document in Chart   Glucose, capillary     Status: Abnormal   Collection Time: 05/25/19  8:46 PM  Result Value Ref Range   Glucose-Capillary 161 (H) 70 - 99 mg/dL    Comment: Glucose reference range applies only to samples taken after fasting for at least 8 hours.   Comment 1 Notify RN    Comment 2 Document in Chart   Glucose, capillary     Status: None   Collection Time: 05/26/19  5:54 AM  Result Value Ref Range   Glucose-Capillary 81 70 - 99 mg/dL    Comment: Glucose reference range applies only to samples taken after fasting for at least 8 hours.   Comment 1 Notify RN    Comment 2 Document in Chart   Glucose, capillary     Status: Abnormal   Collection Time: 05/26/19 12:07 PM  Result Value Ref Range   Glucose-Capillary 172 (H) 70 - 99 mg/dL    Comment: Glucose reference range applies  only to samples taken after fasting for at least 8 hours.  Glucose, capillary     Status: Abnormal   Collection Time: 05/26/19  5:24 PM  Result Value Ref Range   Glucose-Capillary 116 (H) 70 - 99 mg/dL    Comment: Glucose reference range applies only to samples taken after fasting for at least 8 hours.  Glucose, capillary     Status: Abnormal   Collection Time: 05/26/19  9:43 PM  Result Value Ref Range   Glucose-Capillary 112 (H) 70 - 99 mg/dL    Comment: Glucose reference range applies only to samples taken after fasting for at least 8 hours.  Glucose, capillary     Status: None   Collection Time: 05/27/19  5:51 AM  Result Value Ref Range   Glucose-Capillary 75 70 - 99 mg/dL    Comment: Glucose reference range applies only to samples taken after fasting for at least 8 hours.   Comment 1 Notify RN    Comment 2 Document in Chart   Glucose, capillary     Status: Abnormal   Collection Time: 05/27/19 11:51 AM  Result Value Ref Range   Glucose-Capillary 129 (H) 70 - 99 mg/dL    Comment: Glucose reference range applies only to samples taken after fasting for at least 8 hours.   Comment 1 Notify RN    Comment 2 Document in Chart    Blood Alcohol level:  Lab Results  Component Value Date   ETH 16 (H) 05/19/2019   ETH <10 67/61/9509   Metabolic Disorder Labs: Lab Results  Component Value Date   HGBA1C 5.6 05/23/2019   MPG 114.02 05/23/2019   No results found for: PROLACTIN No results found for: CHOL, TRIG, HDL, CHOLHDL, VLDL, LDLCALC  Physical Findings: AIMS: Facial and Oral Movements Muscles of Facial Expression: None, normal Lips and  Perioral Area: None, normal Jaw: None, normal Tongue: None, normal,Extremity Movements Upper (arms, wrists, hands, fingers): None, normal Lower (legs, knees, ankles, toes): None, normal, Trunk Movements Neck, shoulders, hips: None, normal, Overall Severity Severity of abnormal movements (highest score from questions above): None,  normal Incapacitation due to abnormal movements: None, normal Patient's awareness of abnormal movements (rate only patient's report): No Awareness, Dental Status Current problems with teeth and/or dentures?: No Does patient usually wear dentures?: No  CIWA:    COWS:     Musculoskeletal: Strength & Muscle Tone: within normal limits Gait & Station: normal Patient leans: N/A  Psychiatric Specialty Exam: Physical Exam  Nursing note and vitals reviewed. Constitutional: He is oriented to person, place, and time. He appears well-developed.  HENT:  Head: Normocephalic.  Eyes: Pupils are equal, round, and reactive to light.  Cardiovascular:  Elevated blood pressure  Respiratory: Effort normal.  GI:  Hx, Crohn's Ds  Genitourinary:    Genitourinary Comments: Deferred   Musculoskeletal:        General: Normal range of motion.     Cervical back: Normal range of motion.  Neurological: He is alert and oriented to person, place, and time.  Skin: Skin is warm and dry.    Review of Systems  Constitutional: Negative for chills, diaphoresis and fever.  HENT: Negative for rhinorrhea, sneezing and sore throat.   Eyes: Negative for discharge.  Respiratory: Negative for cough, chest tightness, shortness of breath and wheezing.   Cardiovascular: Negative for chest pain and palpitations.  Gastrointestinal: Negative for diarrhea, nausea and vomiting.  Genitourinary: Negative for difficulty urinating.  Musculoskeletal: Positive for arthralgias and myalgias.  Skin: Negative.   Allergic/Immunologic: Negative for environmental allergies and food allergies.       Allergies: NKDA  Neurological: Negative.   Psychiatric/Behavioral: Positive for dysphoric mood ("Improving"). Negative for agitation, behavioral problems, confusion, decreased concentration, hallucinations, self-injury (Hx. attempted to hang himself), sleep disturbance and suicidal ideas (hx. Suicide attempt by hanging.). The patient is not  nervous/anxious and is not hyperactive.     Blood pressure (!) 144/83, pulse 71, temperature 97.8 F (36.6 C), temperature source Oral, resp. rate 14, height 5' 4"  (1.626 m), weight 67.1 kg, SpO2 100 %.Body mass index is 25.4 kg/m.  General Appearance: Casual  Eye Contact:  Good  Speech:  Normal Rate  Volume:  Normal  Mood: "improving"  Affect:  Congruent  Thought Process:  Coherent and Descriptions of Associations: Intact  Orientation:  Full (Time, Place, and Person)  Thought Content:  Logical  Suicidal Thoughts:  No  Homicidal Thoughts:  No  Memory:  Immediate;   Fair Recent;   Fair Remote;   Fair  Judgement:  Intact  Insight:  Fair  Psychomotor Activity:  Increased  Concentration:  Concentration: Fair and Attention Span: Fair  Recall:  [employer] of Knowledge:  Good  Language:  Good  Akathisia:  Negative  Handed:  Right  AIMS (if indicated):     Assets:  Desire for Improvement Housing Resilience  ADL's:  Intact  Cognition:  WNL    Sleep:  Number of Hours: 4   Treatment Plan Summary: Daily contact with patient to assess and evaluate symptoms and progress in treatment and Medication management.  - Continue inpatient hospitalization.  - Will continue today 05/27/2019 plan as below except where it is noted.  Mood stabilization.     - Continue tegretol (chewable) 100 mg po bid.  Depression.     - Continue Prozac 40  mg po daily.  Other medications.     - Continue Novolog insulin  0-6 units SubQ tid with meals.     - Continue the sliding scale insulin coverage as recommended per protocols.     - Continue Protonix 40 mg po daily for GERD.     - Continue prednisone 60 mg po daily with breakfast for inflammation.     - Continue Nicoderm CQ trans-dermally Q 24 hrs for smoking cessation.     - Continue oxyCODONE IR 10 mg po Q 6 hrs prn for pain.  Patient to continue to attend group sessions. Discharge disposition in progress.  Lindell Spar, NP, PMHNP,  FNP-BC. 05/27/2019, 2:20 PM

## 2019-05-27 NOTE — Progress Notes (Signed)
Recreation Therapy Notes  Date:  4.23.21 Time: 0930 Location: [address] Group Room  Group Topic: Stress Management  Goal Area(s) Addresses:  Patient will identify positive stress management techniques. Patient will identify benefits of using stress management post d/c.  Behavioral Response:  Engaged  Intervention: Stress Management  Activity: Meditation.  LRT played a meditation that focused on taking on the characteristics of a mountain.  Patients were to listen and follow along as meditation played to engage in activity.  Education:  Stress Management, Discharge Planning.   Education Outcome: Acknowledges Education  Clinical Observations/Feedback:  Pt attended and participated in activity.     Victorino Sparrow, LRT/CTRS    Victorino Sparrow A 05/27/2019 11:24 AM

## 2019-05-27 NOTE — Progress Notes (Signed)
   05/26/19 2200  Psych Admission Type (Psych Patients Only)  Admission Status Involuntary  Psychosocial Assessment  Patient Complaints Anxiety  Eye Contact Fair  Facial Expression Anxious  Affect Anxious;Depressed  Speech Logical/coherent  Interaction Assertive;Guarded  Motor Activity Slow  Appearance/Hygiene Unremarkable;In scrubs  Behavior Characteristics Cooperative;Anxious  Mood Depressed;Anxious;Pleasant  Thought Process  Coherency WDL  Content WDL  Delusions None reported or observed  Perception WDL  Hallucination None reported or observed  Judgment Poor  Confusion None  Danger to Self  Current suicidal ideation? Denies  Danger to Others  Danger to Others None reported or observed   Pt rates pain from Crohn's as 8/10. Pt understands that he has to wait 6 hours between pain medicine administrations. Next dose due at [place].

## 2019-05-27 NOTE — Tx Team (Signed)
Interdisciplinary Treatment and Diagnostic Plan Update  05/27/2019 Time of Session: 9:50am Jeffery Mcintosh MRN: [id number]  Principal Diagnosis: MDD (major depressive disorder), recurrent severe, without psychosis ([place])  Secondary Diagnoses: Principal Problem:   MDD (major depressive disorder), recurrent severe, without psychosis ([place])   Current Medications:  Current Facility-Administered Medications  Medication Dose Route Frequency Provider Last Rate Last Admin  . alum & mag hydroxide-simeth (MAALOX/MYLANTA) 200-200-20 MG/5ML suspension 30 mL  30 mL Oral Q4H PRN Sharma Covert, MD      . carbamazepine (TEGRETOL) chewable tablet 100 mg  100 mg Oral BID Sharma Covert, MD   100 mg at 05/27/19 0751  . FLUoxetine (PROZAC) capsule 40 mg  40 mg Oral Daily Sharma Covert, MD   40 mg at 05/27/19 0750  . folic acid (FOLVITE) tablet 1 mg  1 mg Oral Daily Sharma Covert, MD   1 mg at 05/27/19 0751  . insulin aspart (novoLOG) injection 0-6 Units  0-6 Units Subcutaneous TID WC Sharma Covert, MD      . magnesium hydroxide (MILK OF MAGNESIA) suspension 30 mL  30 mL Oral Daily PRN Sharma Covert, MD      . multivitamin with minerals tablet 1 tablet  1 tablet Oral Daily Sharma Covert, MD   1 tablet at 05/27/19 0750  . nicotine (NICODERM CQ - dosed in mg/24 hours) patch 21 mg  21 mg Transdermal Daily Sharma Covert, MD   21 mg at 05/27/19 0752  . oxyCODONE (Oxy IR/ROXICODONE) immediate release tablet 10 mg  10 mg Oral Q6H PRN Sharma Covert, MD   10 mg at 05/27/19 0626  . pantoprazole (PROTONIX) EC tablet 40 mg  40 mg Oral Daily Sharma Covert, MD   40 mg at 05/27/19 0751  . predniSONE (DELTASONE) tablet 60 mg  60 mg Oral Q breakfast Sharma Covert, MD   60 mg at 05/27/19 0751   PTA Medications: Medications Prior to Admission  Medication Sig Dispense Refill Last Dose  . amphetamine-dextroamphetamine (ADDERALL XR) 25 MG 24 hr capsule Take 25 mg by mouth  every morning.     Marland Kitchen FLUoxetine (PROZAC) 10 MG capsule Take 20 mg by mouth daily.     . Multiple Vitamin (MULTIVITAMIN WITH MINERALS) TABS tablet Take 1 tablet by mouth daily.     . predniSONE (DELTASONE) 20 MG tablet Take 3 tablets (60 mg total) by mouth daily with breakfast for 14 days, THEN 2.5 tablets (50 mg total) daily with breakfast for 14 days, THEN 2 tablets (40 mg total) daily with breakfast for 21 days. 119 tablet 0     Patient Stressors: Health problems Marital or family conflict Traumatic event  Patient Strengths: Ability for insight Average or above average intelligence Capable of independent living Motivation for treatment/growth Physical Health Supportive family/friends  Treatment Modalities: Medication Management, Group therapy, Case management,  1 to 1 session with clinician, Psychoeducation, Recreational therapy.   Physician Treatment Plan for Primary Diagnosis: MDD (major depressive disorder), recurrent severe, without psychosis ([place]) Long Term Goal(s): Improvement in symptoms so as ready for discharge Improvement in symptoms so as ready for discharge   Short Term Goals: Ability to identify changes in lifestyle to reduce recurrence of condition will improve Ability to verbalize feelings will improve Ability to disclose and discuss suicidal ideas Ability to demonstrate self-control will improve Ability to identify and develop effective coping behaviors will improve Ability to maintain clinical measurements within normal limits will improve Ability  to identify changes in lifestyle to reduce recurrence of condition will improve Ability to verbalize feelings will improve Ability to disclose and discuss suicidal ideas Ability to demonstrate self-control will improve Ability to identify and develop effective coping behaviors will improve Ability to maintain clinical measurements within normal limits will improve  Medication Management: Evaluate patient's response,  side effects, and tolerance of medication regimen.  Therapeutic Interventions: 1 to 1 sessions, Unit Group sessions and Medication administration.  Evaluation of Outcomes: Progressing  Physician Treatment Plan for Secondary Diagnosis: Principal Problem:   MDD (major depressive disorder), recurrent severe, without psychosis ([place])  Long Term Goal(s): Improvement in symptoms so as ready for discharge Improvement in symptoms so as ready for discharge   Short Term Goals: Ability to identify changes in lifestyle to reduce recurrence of condition will improve Ability to verbalize feelings will improve Ability to disclose and discuss suicidal ideas Ability to demonstrate self-control will improve Ability to identify and develop effective coping behaviors will improve Ability to maintain clinical measurements within normal limits will improve Ability to identify changes in lifestyle to reduce recurrence of condition will improve Ability to verbalize feelings will improve Ability to disclose and discuss suicidal ideas Ability to demonstrate self-control will improve Ability to identify and develop effective coping behaviors will improve Ability to maintain clinical measurements within normal limits will improve     Medication Management: Evaluate patient's response, side effects, and tolerance of medication regimen.  Therapeutic Interventions: 1 to 1 sessions, Unit Group sessions and Medication administration.  Evaluation of Outcomes: Progressing   RN Treatment Plan for Primary Diagnosis: MDD (major depressive disorder), recurrent severe, without psychosis ([place]) Long Term Goal(s): Knowledge of disease and therapeutic regimen to maintain health will improve  Short Term Goals: Ability to participate in decision making will improve, Ability to verbalize feelings will improve, Ability to disclose and discuss suicidal ideas, Ability to identify and develop effective coping behaviors will improve  and Compliance with prescribed medications will improve  Medication Management: RN will administer medications as ordered by provider, will assess and evaluate patient's response and provide education to patient for prescribed medication. RN will report any adverse and/or side effects to prescribing provider.  Therapeutic Interventions: 1 on 1 counseling sessions, Psychoeducation, Medication administration, Evaluate responses to treatment, Monitor vital signs and CBGs as ordered, Perform/monitor CIWA, COWS, AIMS and Fall Risk screenings as ordered, Perform wound care treatments as ordered.  Evaluation of Outcomes: Progressing   LCSW Treatment Plan for Primary Diagnosis: MDD (major depressive disorder), recurrent severe, without psychosis ([place]) Long Term Goal(s): Safe transition to appropriate next level of care at discharge, Engage patient in therapeutic group addressing interpersonal concerns.  Short Term Goals: Engage patient in aftercare planning with referrals and resources  Therapeutic Interventions: Assess for all discharge needs, 1 to 1 time with Social worker, Explore available resources and support systems, Assess for adequacy in community support network, Educate family and significant other(s) on suicide prevention, Complete Psychosocial Assessment, Interpersonal group therapy.  Evaluation of Outcomes: Progressing   Progress in Treatment: Attending groups: No. Participating in groups: No. Taking medication as prescribed: Yes. Toleration medication: Yes. Family/Significant other contact made: No, will contact:  no one, patient declined consent for collateral contacts Patient understands diagnosis: Yes. Discussing patient identified problems/goals with staff: Yes. Medical problems stabilized or resolved: Yes. Denies suicidal/homicidal ideation: Yes. Issues/concerns per patient self-inventory: No. Other:   New problem(s) identified: None   New Short Term/Long Term Goal(s):   medication stabilization, elimination of SI  thoughts, development of comprehensive mental wellness plan.    Patient Goals: "Lower my stress levels and work on my coping skills"    Discharge Plan or Barriers: Patient recently admitted. CSW will continue to follow and assess for appropriate referrals and possible discharge planning.    Reason for Continuation of Hospitalization: Anxiety Depression Medication stabilization Suicidal ideation  Estimated Length of Stay: 3-5 days   Attendees: Patient: Jeffery Mcintosh  05/27/2019 11:12 AM  Physician: Dr. Myles Lipps, MD 05/27/2019 11:12 AM  Nursing:  05/27/2019 11:12 AM  RN Care Manager: 05/27/2019 11:12 AM  Social Worker: Radonna Ricker, LCSW 05/27/2019 11:12 AM  Recreational Therapist:  05/27/2019 11:12 AM  Other:  05/27/2019 11:12 AM  Other:  05/27/2019 11:12 AM  Other: 05/27/2019 11:12 AM    Scribe for Treatment Team: Marylee Floras, [place] 05/27/2019 11:12 AM

## 2019-05-27 NOTE — Plan of Care (Signed)
Nurse discussed anxiety, depression and coping skills with patient.  

## 2019-05-28 LAB — GLUCOSE, CAPILLARY
Glucose-Capillary: 119 mg/dL — ABNORMAL HIGH (ref 70–99)
Glucose-Capillary: 150 mg/dL — ABNORMAL HIGH (ref 70–99)
Glucose-Capillary: 123 mg/dL — ABNORMAL HIGH (ref 70–99)
Glucose-Capillary: 119 mg/dL — ABNORMAL HIGH (ref 70–99)

## 2019-05-28 NOTE — Progress Notes (Signed)
[facility] Group Notes:  (Nursing/MHT/Case Management/Adjunct)  Date:  05/28/2019  Time:  2030  Type of Therapy:  wrap up group  Participation Level:  Active  Participation Quality:  Appropriate, Attentive, Sharing and Supportive  Affect:  Depressed  Cognitive:  Appropriate  Insight:  Improving  Engagement in Group:  Engaged  Modes of Intervention:  Clarification, Education and Support  Summary of Progress/Problems: Positive thinking and positive change were discussed.   Shellia Cleverly 05/28/2019, 9:33 PM

## 2019-05-28 NOTE — Progress Notes (Signed)
CBG 121 at HS. No coverage needed. Patient was in bed asleep then woke up complaining of abdominal pain. Presented to the medication room to request pain medications. Received Oxycodone 52m by mouth as prescribed.

## 2019-05-28 NOTE — [facility] Group Notes (Signed)
LCSW Group Therapy Note  05/28/2019   10:00-11:00am   Type of Therapy and Topic:  Group Therapy: Anger Cues and Responses  Participation Level:  Active   Description of Group:   In this group, patients learned how to recognize the physical, cognitive, emotional, and behavioral responses they have to anger-provoking situations.  They identified a recent time they became angry and how they reacted.  They analyzed how their reaction was possibly beneficial and how it was possibly unhelpful.  The group discussed a variety of healthier coping skills that could help with such a situation in the future.  Focus was placed on how helpful it is to recognize the underlying emotions to our anger, because working on those can lead to a more permanent solution as well as our ability to focus on the important rather than the urgent.  Therapeutic Goals: 1. Patients will remember their last incident of anger and how they felt emotionally and physically, what their thoughts were at the time, and how they behaved. 2. Patients will identify how their behavior at that time worked for them, as well as how it worked against them. 3. Patients will explore possible new behaviors to use in future anger situations. 4. Patients will learn that anger itself is normal and cannot be eliminated, and that healthier reactions can assist with resolving conflict rather than worsening situations.  Summary of Patient Progress:  The patient shared that his most recent time of anger was from 4/13 to 4/15 in particular and told the story of going to the ED for treatment, not getting treatment, going elsewhere, having a CT and not being hospitalized when he should have been.  He and said his ultimate behavior due to his anger was to try to hang himself by placing an electrical cord around his neck and jumping from the 2nd floor balcony and when that failed, trying to grab a cop's gun in order to "commit suicide by cop."  He was able to  listen to other people's reasoning, was able to consider challenging his own thoughts and their validity, and was a very interactive member of the group throughout.  Therapeutic Modalities:   Cognitive Behavioral Therapy  Maretta Los

## 2019-05-28 NOTE — Progress Notes (Signed)
D. Pt has been calm and cooperative, has been visible on the unit interacting appropriately with peers- per pt's self inventory, pt rated his depression, hopelessness and anxiety all 0's. Pt continues to complain of lower abdominal pain- 7/10- reporting that the pain lessens 'somewhat' with Oxycodone.  Pt currently denies SI/HI and AVH A. Labs and vitals monitored. Pt compliant with medications. Pt supported emotionally and encouraged to express concerns and ask questions.   R. Pt remains safe with 15 minute checks. Will continue POC.

## 2019-05-28 NOTE — Progress Notes (Signed)
[facility] MD Progress Note  05/28/2019 1:42 PM Jeffery Mcintosh  MRN:  [id number]  Subjective: Jeffery Mcintosh reports, "I'm doing really well. My depression has really has improved since being here. I slept well last night".   Objective: Patient is a 39 year old male with a past psychiatric history reportedly for bipolar disorder, attention deficit hyperactivity disorder, posttraumatic stress disorder and a past medical history significant for Crohn's disease who originally presented to the [facility] emergency department on 05/19/2019 after an attempted suicide attempt by hanging. The patient stated that he had been feeling suicidal for some time. He apparently has had several suicide attempts in the past, but denied that to me. His family found him trying to hang himself, and called the police. When police arrived he had placed the noose around his neck. After they loosen the noose he jumped off a second floor balcony to a carpeted patio below. He landed on his feet and did not hit his head. He was taken to the emergency department. The patient stated that he has been quite frustrated over his Crohn's disease. Jeffery Mcintosh is seen, chart reviewed. The chart findings discussed with the treatment team. He presents alert, oriented x 4. He is visible on unit, attending group sessions. He says he is doing well today, better than when he came to the hospital. He stated yesterday that he came to the hospital after he attempted to hang himself due to an ongoing unbearable pain associated with Crohn's disease. He says he is glad he was unsuccessful in hanging himself. He says his depression has improved since being here in the hospital. He says he slept well last night. He denies any anxiety issues. He denies any SIHI, AVH, delusional thoughts or paranoia. He does not appear to be responding to any internal stimuli. He is sleeping well. Jeffery Mcintosh is in agreement to continue his current plan of care as  already in progress. Jeffery Mcintosh will be discharged in the morning.  Principal Problem: MDD (major depressive disorder), recurrent severe, without psychosis ([place])  Diagnosis: Principal Problem:   MDD (major depressive disorder), recurrent severe, without psychosis ([place])  Total Time spent with patient: 25 minutes  Past Psychiatric History: See H&P  Past Medical History:  Past Medical History:  Diagnosis Date  . Anxiety   . Arthritis   . Bipolar affective ([place])   . Chronic headaches   . Crohn's disease ([place]) history of  . Depression   . GERD (gastroesophageal reflux disease)   . Hypertension   . Noncompliance 04/17/2018  . PTSD (post-traumatic stress disorder) 04/17/2018  . Schizophrenic disorder [facility])     Past Surgical History:  Procedure Laterality Date  . NO PAST SURGERIES     Family History:  Family History  Problem Relation Age of Onset  . Hypertension Mother   . Heart attack Mother   . Stroke Mother   . Alcohol abuse Father   . Mental illness Father   . Multiple sclerosis Father   . Arthritis Maternal Grandmother   . Hypertension Maternal Grandmother   . Heart disease Maternal Grandmother   . Kidney disease Maternal Grandmother   . Diabetes Maternal Grandmother   . Cancer Maternal Grandmother        great, type unknown  . Hyperlipidemia Maternal Grandfather   . Colon polyps Maternal Grandfather   . Stroke Paternal Grandmother   . Ulcerative colitis Maternal Aunt    Family Psychiatric  History: See H&P  Social History:  Social History   Substance  and Sexual Activity  Alcohol Use No  . Alcohol/week: 0.0 standard drinks     Social History   Substance and Sexual Activity  Drug Use No    Social History   Socioeconomic History  . Marital status: Single    Spouse name: Not on file  . Number of children: 2  . Years of education: 77  . Highest education level: Not on file  Occupational History  . Occupation: disabled    Comment: Crohn's / Bipolar /  Schizophrenia  Tobacco Use  . Smoking status: Former Smoker    Packs/day: 0.50    Years: 15.00    Pack years: 7.50    Types: Cigars  . Smokeless tobacco: Never Used  Substance and Sexual Activity  . Alcohol use: No    Alcohol/week: 0.0 standard drinks  . Drug use: No  . Sexual activity: Not Currently    Birth control/protection: None  Other Topics Concern  . Not on file  Social History Narrative  . Not on file   Social Determinants of Health   Financial Resource Strain:   . Difficulty of Paying Living Expenses:   Food Insecurity:   . Worried About Charity fundraiser in the Last Year:   . Arboriculturist in the Last Year:   Transportation Needs:   . Film/video editor (Medical):   Marland Kitchen Lack of Transportation (Non-Medical):   Physical Activity:   . Days of Exercise per Week:   . Minutes of Exercise per Session:   Stress:   . Feeling of Stress :   Social Connections:   . Frequency of Communication with Friends and Family:   . Frequency of Social Gatherings with Friends and Family:   . Attends Religious Services:   . Active Member of Clubs or Organizations:   . Attends Archivist Meetings:   Marland Kitchen Marital Status:    Additional Social History:   Sleep: Good  Appetite:  Good  Current Medications: Current Facility-Administered Medications  Medication Dose Route Frequency Provider Last Rate Last Admin  . alum & mag hydroxide-simeth (MAALOX/MYLANTA) 200-200-20 MG/5ML suspension 30 mL  30 mL Oral Q4H PRN Sharma Covert, MD      . carbamazepine (TEGRETOL) chewable tablet 100 mg  100 mg Oral BID Sharma Covert, MD   100 mg at 05/28/19 0811  . feeding supplement (ENSURE ENLIVE) (ENSURE ENLIVE) liquid 237 mL  237 mL Oral BID BM Sharma Covert, MD   237 mL at 05/28/19 1019  . FLUoxetine (PROZAC) capsule 40 mg  40 mg Oral Daily Sharma Covert, MD   40 mg at 05/28/19 1610  . folic acid (FOLVITE) tablet 1 mg  1 mg Oral Daily Sharma Covert, MD   1 mg  at 05/28/19 9604  . insulin aspart (novoLOG) injection 0-6 Units  0-6 Units Subcutaneous TID WC Sharma Covert, MD   1 Units at 05/27/19 1759  . magnesium hydroxide (MILK OF MAGNESIA) suspension 30 mL  30 mL Oral Daily PRN Sharma Covert, MD      . multivitamin with minerals tablet 1 tablet  1 tablet Oral Daily Sharma Covert, MD   1 tablet at 05/28/19 [phone]  . nicotine (NICODERM CQ - dosed in mg/24 hours) patch 21 mg  21 mg Transdermal Daily Sharma Covert, MD   21 mg at 05/28/19 0814  . oxyCODONE (Oxy IR/ROXICODONE) immediate release tablet 10 mg  10 mg Oral Q6H PRN Myles Lipps  Kellie Simmering, MD   10 mg at 05/28/19 0811  . pantoprazole (PROTONIX) EC tablet 40 mg  40 mg Oral Daily Sharma Covert, MD   40 mg at 05/28/19 2979  . predniSONE (DELTASONE) tablet 60 mg  60 mg Oral Q breakfast Sharma Covert, MD   60 mg at 05/28/19 8921   Lab Results:  Results for orders placed or performed during the hospital encounter of 05/25/19 (from the past 48 hour(s))  Glucose, capillary     Status: Abnormal   Collection Time: 05/26/19  5:24 PM  Result Value Ref Range   Glucose-Capillary 116 (H) 70 - 99 mg/dL    Comment: Glucose reference range applies only to samples taken after fasting for at least 8 hours.  Glucose, capillary     Status: Abnormal   Collection Time: 05/26/19  9:43 PM  Result Value Ref Range   Glucose-Capillary 112 (H) 70 - 99 mg/dL    Comment: Glucose reference range applies only to samples taken after fasting for at least 8 hours.  Glucose, capillary     Status: None   Collection Time: 05/27/19  5:51 AM  Result Value Ref Range   Glucose-Capillary 75 70 - 99 mg/dL    Comment: Glucose reference range applies only to samples taken after fasting for at least 8 hours.   Comment 1 Notify RN    Comment 2 Document in Chart   Glucose, capillary     Status: Abnormal   Collection Time: 05/27/19 11:51 AM  Result Value Ref Range   Glucose-Capillary 129 (H) 70 - 99 mg/dL     Comment: Glucose reference range applies only to samples taken after fasting for at least 8 hours.   Comment 1 Notify RN    Comment 2 Document in Chart   Glucose, capillary     Status: Abnormal   Collection Time: 05/27/19  5:35 PM  Result Value Ref Range   Glucose-Capillary 152 (H) 70 - 99 mg/dL    Comment: Glucose reference range applies only to samples taken after fasting for at least 8 hours.   Comment 1 Notify RN    Comment 2 Document in Chart   Glucose, capillary     Status: Abnormal   Collection Time: 05/27/19  9:05 PM  Result Value Ref Range   Glucose-Capillary 121 (H) 70 - 99 mg/dL    Comment: Glucose reference range applies only to samples taken after fasting for at least 8 hours.  Glucose, capillary     Status: Abnormal   Collection Time: 05/28/19  6:13 AM  Result Value Ref Range   Glucose-Capillary 119 (H) 70 - 99 mg/dL    Comment: Glucose reference range applies only to samples taken after fasting for at least 8 hours.  Glucose, capillary     Status: Abnormal   Collection Time: 05/28/19 12:05 PM  Result Value Ref Range   Glucose-Capillary 150 (H) 70 - 99 mg/dL    Comment: Glucose reference range applies only to samples taken after fasting for at least 8 hours.   Blood Alcohol level:  Lab Results  Component Value Date   ETH 16 (H) 05/19/2019   ETH <10 19/41/7408   Metabolic Disorder Labs: Lab Results  Component Value Date   HGBA1C 5.6 05/23/2019   MPG 114.02 05/23/2019   No results found for: PROLACTIN No results found for: CHOL, TRIG, HDL, CHOLHDL, VLDL, LDLCALC  Physical Findings: AIMS: Facial and Oral Movements Muscles of Facial Expression: None, normal Lips and Perioral  Area: None, normal Jaw: None, normal Tongue: None, normal,Extremity Movements Upper (arms, wrists, hands, fingers): None, normal Lower (legs, knees, ankles, toes): None, normal, Trunk Movements Neck, shoulders, hips: None, normal, Overall Severity Severity of abnormal movements (highest  score from questions above): None, normal Incapacitation due to abnormal movements: None, normal Patient's awareness of abnormal movements (rate only patient's report): No Awareness, Dental Status Current problems with teeth and/or dentures?: No Does patient usually wear dentures?: No  CIWA:    COWS:     Musculoskeletal: Strength & Muscle Tone: within normal limits Gait & Station: normal Patient leans: N/A  Psychiatric Specialty Exam: Physical Exam  Nursing note and vitals reviewed. Constitutional: He is oriented to person, place, and time. He appears well-developed.  HENT:  Head: Normocephalic.  Eyes: Pupils are equal, round, and reactive to light.  Cardiovascular:  Elevated blood pressure  Respiratory: Effort normal.  GI:  Hx, Crohn's Ds  Genitourinary:    Genitourinary Comments: Deferred   Musculoskeletal:        General: Normal range of motion.     Cervical back: Normal range of motion.  Neurological: He is alert and oriented to person, place, and time.  Skin: Skin is warm and dry.    Review of Systems  Constitutional: Negative for chills, diaphoresis and fever.  HENT: Negative for rhinorrhea, sneezing and sore throat.   Eyes: Negative for discharge.  Respiratory: Negative for cough, chest tightness, shortness of breath and wheezing.   Cardiovascular: Negative for chest pain and palpitations.  Gastrointestinal: Negative for diarrhea, nausea and vomiting.  Genitourinary: Negative for difficulty urinating.  Musculoskeletal: Positive for arthralgias and myalgias.  Skin: Negative.   Allergic/Immunologic: Negative for environmental allergies and food allergies.       Allergies: NKDA  Neurological: Negative.   Psychiatric/Behavioral: Positive for dysphoric mood ("Improving"). Negative for agitation, behavioral problems, confusion, decreased concentration, hallucinations, self-injury (Hx. attempted to hang himself), sleep disturbance and suicidal ideas (hx. Suicide attempt  by hanging.). The patient is not nervous/anxious and is not hyperactive.     Blood pressure 139/84, pulse 74, temperature (!) 97 F (36.1 C), resp. rate 16, height 5' 4"  (1.626 m), weight 67.1 kg, SpO2 100 %.Body mass index is 25.4 kg/m.  General Appearance: Casual  Eye Contact:  Good  Speech:  Normal Rate  Volume:  Normal  Mood: "improving"  Affect:  Congruent  Thought Process:  Coherent and Descriptions of Associations: Intact  Orientation:  Full (Time, Place, and Person)  Thought Content:  Logical  Suicidal Thoughts:  No  Homicidal Thoughts:  No  Memory:  Immediate;   Fair Recent;   Fair Remote;   Fair  Judgement:  Intact  Insight:  Fair  Psychomotor Activity:  Increased  Concentration:  Concentration: Fair and Attention Span: Fair  Recall:  [employer] of Knowledge:  Good  Language:  Good  Akathisia:  Negative  Handed:  Right  AIMS (if indicated):     Assets:  Desire for Improvement Housing Resilience  ADL's:  Intact  Cognition:  WNL    Sleep:  Number of Hours: 4   Treatment Plan Summary: Daily contact with patient to assess and evaluate symptoms and progress in treatment and Medication management.  - Continue inpatient hospitalization.  - Will continue today 05/28/2019 plan as below except where it is noted.  Mood stabilization.     - Continue tegretol (chewable) 100 mg po bid.  Depression.     - Continue Prozac 40 mg po daily.  Other medications.     - Continue Novolog insulin  0-6 units SubQ tid with meals.     - Continue the sliding scale insulin coverage as recommended per protocols.     - Continue Protonix 40 mg po daily for GERD.     - Continue prednisone 60 mg po daily with breakfast for inflammation.     - Continue Nicoderm CQ trans-dermally Q 24 hrs for smoking cessation.     - Continue oxyCODONE IR 10 mg po Q 6 hrs prn for pain.  Patient to continue to attend group sessions. Discharge disposition in progress.  Lindell Spar, NP, PMHNP,  FNP-BC. 05/28/2019, 1:42 PMPatient ID: Jeffery Mcintosh, male   DOB: Aug 28, 1980, 39 y.o.   MRN: [id number]

## 2019-05-28 NOTE — Progress Notes (Signed)
CBG 119 this morning.

## 2019-05-28 NOTE — Plan of Care (Signed)
Active in the milieu and cooperative. Attending groups. Continues to complain of abdominal pain and receiving medication as prescribed. CBG 121. Support and encouragements provided. Safety monitored as expected.

## 2019-05-29 LAB — GLUCOSE, CAPILLARY: Glucose-Capillary: 94 mg/dL (ref 70–99)

## 2019-05-29 MED ORDER — CARBAMAZEPINE 100 MG PO CHEW: 100.0000 mg | CHEWABLE_TABLET | Freq: Two times a day (BID) | ORAL | 0 refills | Status: DC

## 2019-05-29 MED ORDER — OXYCODONE HCL 10 MG PO TABS: 10.0000 mg | ORAL_TABLET | Freq: Four times a day (QID) | ORAL | 0 refills | Status: DC | PRN

## 2019-05-29 MED ORDER — NICOTINE 21 MG/24HR TD PT24: 21.0000 mg | MEDICATED_PATCH | Freq: Every day | TRANSDERMAL | 0 refills | Status: DC

## 2019-05-29 MED ORDER — PREDNISONE 20 MG PO TABS: ORAL_TABLET | ORAL | 0 refills | Status: DC

## 2019-05-29 MED ORDER — FLUOXETINE HCL 40 MG PO CAPS: 40.0000 mg | ORAL_CAPSULE | Freq: Every day | ORAL | 0 refills | Status: DC

## 2019-05-29 MED ORDER — PANTOPRAZOLE SODIUM 40 MG PO TBEC: 40.0000 mg | DELAYED_RELEASE_TABLET | Freq: Every day | ORAL | 0 refills | Status: DC

## 2019-05-29 NOTE — Progress Notes (Signed)
Pt discharged to lobby. Pt was stable and appreciative at that time. All papers and prescriptions were given and valuables returned. Verbal understanding expressed. Denies SI/HI and A/VH. Pt given opportunity to express concerns and ask questions.

## 2019-05-29 NOTE — Progress Notes (Signed)
D. Pt has been visible in the milieu interacting well with peers. Per pt's self inventory, pt rated his depression, hopelessness and anxiety all 0's. Pt currently denies SI/HI and AVH A. Labs and vitals monitored. Pt compliant with medications. Pt supported emotionally and encouraged to express concerns and ask questions.   R. Pt remains safe with 15 minute checks. Will continue POC.

## 2019-05-29 NOTE — BHH Group Notes (Signed)
[facility] LCSW Group Therapy Note  05/29/2019    Type of Therapy and Topic:  Group Therapy:  Adding Supports Including Yourself  Participation Level:  Minimal   Description of Group:   Patients in this group were introduced to the concept that additional supports including self-support are an essential part of recovery.  Patients listed what supports they believe they need to add to their lives to achieve their goals at discharge, and they listed such things as therapist, family, doctor, support groups, 12-step groups and service animals.   A song entitled "My Own Hero" was played and a group discussion ensued in which patients stated they could relate to the song and it inspired them to realize they have be willing to help themselves in order to succeed, because other people cannot achieve sobriety or stability for them.  "Fight For It" was played, then "I Am Enough" to encourage patients.  They discussed the impact on them and how they must remain convinced that their lives are worth the effort it takes to become sober and/or stable.  Therapeutic Goals: 1)  demonstrate the importance of being a key part of one's own support system 2)  discuss various available supports 3)  encourage patient to use music as part of their self-support and focus on goals 4)  elicit ideas from patients about supports that need to be added   Summary of Patient Progress:  The patient expressed that his family is a healthy support and he himself is "my own worst enemy."  He was in and out of the room numerous times, had to be called in to listen to the songs.  Therapeutic Modalities:   Motivational Interviewing Activity  Jeffery Mcintosh  8:45 AM

## 2019-05-29 NOTE — BHH Suicide Risk Assessment (Signed)
[facility] Discharge Suicide Risk Assessment   Principal Problem: MDD (major depressive disorder), recurrent severe, without psychosis ([place]) Discharge Diagnoses: Principal Problem:   MDD (major depressive disorder), recurrent severe, without psychosis ([place])   Total Time spent with patient: 20 minutes  Musculoskeletal: Strength & Muscle Tone: within normal limits Gait & Station: normal Patient leans: N/A  Psychiatric Specialty Exam: Review of Systems  Gastrointestinal: Positive for abdominal pain, diarrhea and nausea.  All other systems reviewed and are negative.   Blood pressure 131/82, pulse 86, temperature 98.1 F (36.7 C), temperature source Oral, resp. rate 16, height 5' 4"  (1.626 m), weight 67.1 kg, SpO2 99 %.Body mass index is 25.4 kg/m.  General Appearance: Casual  Eye Contact::  Good  Speech:  Normal Rate409  Volume:  Normal  Mood:  Euthymic  Affect:  Congruent  Thought Process:  Coherent and Descriptions of Associations: Intact  Orientation:  Full (Time, Place, and Person)  Thought Content:  Logical  Suicidal Thoughts:  No  Homicidal Thoughts:  No  Memory:  Immediate;   Good Recent;   Good Remote;   Good  Judgement:  Good  Insight:  Good  Psychomotor Activity:  Normal  Concentration:  Good  Recall:  Good  Fund of Knowledge:Good  Language: Good  Akathisia:  Negative  Handed:  Right  AIMS (if indicated):     Assets:  Communication Skills Desire for Improvement Financial Resources/Insurance Housing Resilience Social Support Talents/Skills Vocational/Educational  Sleep:  Number of Hours: 3.25  Cognition: WNL  ADL's:  Intact   Mental Status Per Nursing Assessment::   On Admission:  Suicidal ideation indicated by patient, Plan includes specific time, place, or method, Intention to act on suicide plan, Belief that plan would result in death, Suicide plan  Demographic Factors:  Male and Living alone  Loss Factors: Decline in physical health  Historical  Factors: Impulsivity  Risk Reduction Factors:   Sense of responsibility to family and Positive social support  Continued Clinical Symptoms:  Depression:   Impulsivity Medical Diagnoses and Treatments/Surgeries  Cognitive Features That Contribute To Risk:  None    Suicide Risk:  Minimal: No identifiable suicidal ideation.  Patients presenting with no risk factors but with morbid ruminations; may be classified as minimal risk based on the severity of the depressive symptoms  [place] Follow up on 06/07/2019.   Why: You are scheduled for an appointment on 06/07/19 at 12:00 pm for medication management.  This will be a Chief Financial Officer appointment with Karalee Height. Contact information: [address] [place] [address] [phone]           Plan Of Care/Follow-up recommendations:  Activity:  ad lib  Sharma Covert, MD 05/29/2019, 7:53 AM

## 2019-05-29 NOTE — Progress Notes (Signed)
  [facility] Adult Case Management Discharge Plan :  Will you be returning to the same living situation after discharge:  No.  Going to grandparents for awhile At discharge, do you have transportation home?: Yes,  arranged by patient Do you have the ability to pay for your medications: Yes,  income and insurance  Release of information consent forms completed and emailed to Medical Records, then turned in to Medical Records by CSW.   Patient to Follow up at: [facility] Follow up on 06/07/2019.   Why: You are scheduled for an appointment on 06/07/19 at 12:00 pm for medication management.  This will be a Chief Financial Officer appointment with Karalee Height. Contact information: [place] [address] [phone]        Cathleen Corti. Call on 05/30/2019.   Why: Your weekday social worker is going to arrange a medical follow-up appointment for you.  Please call her on Monday to receive the pertinent informatoin. Contact information: [facility] [place]  [address] Tel:  [phone] Fax:  [phone]          Next level of care provider has access to [place] and Suicide Prevention discussed: No.  Declined by patient, reviewed with him.     Has patient been referred to the Quitline?: N/A patient is not a smoker  Patient has been referred for addiction treatment: N/A  Maretta Los, LCSW 05/29/2019, 9:13 AM
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
## 2019-06-01 NOTE — Progress Notes (Signed)
Called and informed patient of his appointment with new PCP is on 06/23/19 at 9:30 am with Juluis Mire with [facility].  This will be a virtual appointment, unless it is changed prior, but informed patient to confirm this before the appointment date.

## 2019-06-06 ENCOUNTER — Inpatient Hospital Stay (HOSPITAL_COMMUNITY)
Admission: EM | Admit: 2019-06-06 | Discharge: 2019-06-10 | DRG: 387 | Disposition: A | Discharge status: Home / Self Care | Payer: Medicare Other | Attending: Internal Medicine | Admitting: Internal Medicine

## 2019-06-06 ENCOUNTER — Other Ambulatory Visit: Payer: Self-pay

## 2019-06-06 DIAGNOSIS — Z79899 Other long term (current) drug therapy: Secondary | ICD-10-CM

## 2019-06-06 DIAGNOSIS — Z20822 Contact with and (suspected) exposure to covid-19: Secondary | ICD-10-CM | POA: Diagnosis present

## 2019-06-06 DIAGNOSIS — K50012 Crohn's disease of small intestine with intestinal obstruction: Secondary | ICD-10-CM | POA: Diagnosis not present

## 2019-06-06 DIAGNOSIS — Z87891 Personal history of nicotine dependence: Secondary | ICD-10-CM

## 2019-06-06 DIAGNOSIS — I10 Essential (primary) hypertension: Secondary | ICD-10-CM | POA: Diagnosis present

## 2019-06-06 DIAGNOSIS — F209 Schizophrenia, unspecified: Secondary | ICD-10-CM | POA: Diagnosis present

## 2019-06-06 DIAGNOSIS — F332 Major depressive disorder, recurrent severe without psychotic features: Secondary | ICD-10-CM | POA: Diagnosis present

## 2019-06-06 DIAGNOSIS — R1084 Generalized abdominal pain: Secondary | ICD-10-CM

## 2019-06-06 DIAGNOSIS — K219 Gastro-esophageal reflux disease without esophagitis: Secondary | ICD-10-CM | POA: Diagnosis present

## 2019-06-06 DIAGNOSIS — R1031 Right lower quadrant pain: Secondary | ICD-10-CM | POA: Diagnosis not present

## 2019-06-06 DIAGNOSIS — K56609 Unspecified intestinal obstruction, unspecified as to partial versus complete obstruction: Secondary | ICD-10-CM | POA: Diagnosis present

## 2019-06-06 DIAGNOSIS — F319 Bipolar disorder, unspecified: Secondary | ICD-10-CM | POA: Diagnosis present

## 2019-06-06 DIAGNOSIS — F4310 Post-traumatic stress disorder, unspecified: Secondary | ICD-10-CM | POA: Diagnosis present

## 2019-06-06 DIAGNOSIS — R1030 Lower abdominal pain, unspecified: Secondary | ICD-10-CM

## 2019-06-06 DIAGNOSIS — K5090 Crohn's disease, unspecified, without complications: Secondary | ICD-10-CM | POA: Diagnosis present

## 2019-06-06 LAB — CBC
HCT: 42.6 % (ref 39.0–52.0)
Hemoglobin: 15 g/dL (ref 13.0–17.0)
MCH: 31.3 pg (ref 26.0–34.0)
MCHC: 35.2 g/dL (ref 30.0–36.0)
MCV: 88.8 fL (ref 80.0–100.0)
Platelets: 334 10*3/uL (ref 150–400)
RBC: 4.8 MIL/uL (ref 4.22–5.81)
RDW: 14.8 % (ref 11.5–15.5)
WBC: 7.3 10*3/uL (ref 4.0–10.5)
nRBC: 0 % (ref 0.0–0.2)

## 2019-06-06 LAB — LIPASE, BLOOD: Lipase: 40 U/L (ref 11–51)

## 2019-06-06 LAB — URINALYSIS, ROUTINE W REFLEX MICROSCOPIC
Bilirubin Urine: NEGATIVE
Glucose, UA: NEGATIVE mg/dL
Hgb urine dipstick: NEGATIVE
Ketones, ur: NEGATIVE mg/dL
Leukocytes,Ua: NEGATIVE
Nitrite: NEGATIVE
Protein, ur: NEGATIVE mg/dL
Specific Gravity, Urine: 1.004 — ABNORMAL LOW (ref 1.005–1.030)
pH: 5 (ref 5.0–8.0)

## 2019-06-06 LAB — COMPREHENSIVE METABOLIC PANEL
ALT: 34 U/L (ref 0–44)
AST: 14 U/L — ABNORMAL LOW (ref 15–41)
Albumin: 4 g/dL (ref 3.5–5.0)
Alkaline Phosphatase: 111 U/L (ref 38–126)
Anion gap: 13 (ref 5–15)
BUN: 11 mg/dL (ref 6–20)
CO2: 23 mmol/L (ref 22–32)
Calcium: 9.3 mg/dL (ref 8.9–10.3)
Chloride: 103 mmol/L (ref 98–111)
Creatinine, Ser: 0.79 mg/dL (ref 0.61–1.24)
GFR calc Af Amer: >60 mL/min (ref >60)
GFR calc non Af Amer: >60 mL/min (ref >60)
Glucose, Bld: 92 mg/dL (ref 70–99)
Potassium: 3.8 mmol/L (ref 3.5–5.1)
Sodium: 139 mmol/L (ref 135–145)
Total Bilirubin: 0.7 mg/dL (ref 0.3–1.2)
Total Protein: 7.3 g/dL (ref 6.5–8.1)

## 2019-06-06 MED ORDER — KETOROLAC TROMETHAMINE 15 MG/ML IJ SOLN
15.0000 mg | Freq: Once | INTRAMUSCULAR | Status: AC
  Administered 2019-06-06: 15 mg via INTRAVENOUS
  Filled 2019-06-06: qty 1

## 2019-06-06 MED ORDER — SODIUM CHLORIDE 0.9% FLUSH: 3.0000 mL | Freq: Once | INTRAVENOUS | Status: DC

## 2019-06-06 MED ORDER — LACTATED RINGERS IV SOLN: INTRAVENOUS | Status: DC

## 2019-06-06 MED ORDER — OXYCODONE-ACETAMINOPHEN 5-325 MG PO TABS: 1.0000 | ORAL_TABLET | Freq: Four times a day (QID) | ORAL | 0 refills | Status: DC | PRN

## 2019-06-06 MED ORDER — HYDROMORPHONE HCL 1 MG/ML IJ SOLN
1.0000 mg | Freq: Once | INTRAMUSCULAR | Status: AC
  Administered 2019-06-06: 1 mg via INTRAVENOUS
  Filled 2019-06-06: qty 1

## 2019-06-06 MED ORDER — IOHEXOL 9 MG/ML PO SOLN
500.0000 mL | ORAL | Status: AC
  Administered 2019-06-06 (×2): 500 mL via ORAL

## 2019-06-06 MED ORDER — PREDNISONE 20 MG PO TABS: ORAL_TABLET | ORAL | 0 refills | Status: DC

## 2019-06-06 MED ORDER — METHYLPREDNISOLONE SODIUM SUCC 125 MG IJ SOLR
80.0000 mg | Freq: Once | INTRAMUSCULAR | Status: AC
  Administered 2019-06-06: 80 mg via INTRAVENOUS
  Filled 2019-06-06: qty 2

## 2019-06-06 NOTE — Discharge Instructions (Addendum)
Follow-up with your gastroenterologist as previously scheduled.  Make sure you fill the prescription for prednisone and take it daily.  Take the pain medicine as needed.

## 2019-06-06 NOTE — ED Triage Notes (Signed)
Pt reporting chron's flare up. Nausea, generalized abdominal pain for 2 days.

## 2019-06-06 NOTE — ED Provider Notes (Signed)
[name] DEPT Provider Note   CSN: [id number] Arrival date & time: 06/06/19  1944     History Chief Complaint  Patient presents with  . Abdominal Pain    Jeffery Mcintosh is a 39 y.o. male.  HPI   39 year old male with abdominal pain. Diffuse. History of Crohn's disease and attributes his pain to this. Recently admitted for the same. Had a CT then that showed active inflammation. Returning today because he feels like his pain is no better. Of note, at discharge he was supposed to be on a longer steroid taper continuing through to his GI appointments in June. He states that he ran out of the steroids on Saturday though and has not had any since then. Loose stools. No blood. Some mild intermittent nausea. No vomiting. No urinary complaints.  Past Medical History:  Diagnosis Date  . Anxiety   . Arthritis   . Bipolar affective ([place])   . Chronic headaches   . Crohn's disease ([place]) history of  . Depression   . GERD (gastroesophageal reflux disease)   . Hypertension   . Noncompliance 04/17/2018  . PTSD (post-traumatic stress disorder) 04/17/2018  . Schizophrenic disorder [facility])     Patient Active Problem List   Diagnosis Date Noted  . 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 Crohn's colitis ([place]) 05/20/2019  . Suicidal ideation 05/19/2019  . Crohn's colitis, with fistula ([place]) 05/19/2019  . Crohn's colitis, with intestinal obstruction ([place]) 10/11/2018  . Crohn's disease of colon with intestinal obstruction ([place])   . Noncompliance 04/17/2018  . PTSD (post-traumatic stress disorder) 04/17/2018  . Crohn's disease of jejunum with intestinal obstruction ([place]) 10/26/2017  . SBO (small bowel obstruction) ([place]) 10/26/2017  . Bipolar 1 disorder ([place]) 10/26/2017  . Crohn's colitis, unspecified complication ([place]) 16/38/4665  . Epigastric burning sensation 01/03/2015  . Numbness and tingling in left arm  11/15/2014  . Exacerbation of Crohn's disease ([place]) 10/21/2014  . Crohn's disease ([place]) 10/21/2014  . Diarrhea 10/21/2014  . Abdominal pain   . Gout 04/18/2013  . Cubital tunnel syndrome 04/18/2013  . Abnormal urinalysis 04/18/2013   Past Surgical History:  Procedure Laterality Date  . NO PAST SURGERIES       Family History  Problem Relation Age of Onset  . Hypertension Mother   . Heart attack Mother   . Stroke Mother   . Alcohol abuse Father   . Mental illness Father   . Multiple sclerosis Father   . Arthritis Maternal Grandmother   . Hypertension Maternal Grandmother   . Heart disease Maternal Grandmother   . Kidney disease Maternal Grandmother   . Diabetes Maternal Grandmother   . Cancer Maternal Grandmother        great, type unknown  . Hyperlipidemia Maternal Grandfather   . Colon polyps Maternal Grandfather   . Stroke Paternal Grandmother   . Ulcerative colitis Maternal Aunt    Social History   Tobacco Use  . Smoking status: Former Smoker    Packs/day: 0.50    Years: 15.00    Pack years: 7.50    Types: Cigars  . Smokeless tobacco: Never Used  Substance Use Topics  . Alcohol use: No    Alcohol/week: 0.0 standard drinks  . Drug use: No   Home Medications Prior to Admission medications   Medication Sig Start Date End Date Taking? Authorizing Provider  carbamazepine (TEGRETOL) 100 MG chewable tablet Chew 1 tablet (100 mg total) by mouth 2 (  two) times daily. For mood stabilization 05/29/19  Yes Nwoko, Herbert Pun I, NP  FLUoxetine (PROZAC) 40 MG capsule Take 1 capsule (40 mg total) by mouth daily. For depression 05/30/19  Yes Nwoko, Herbert Pun I, NP  oxyCODONE 10 MG TABS Take 1 tablet (10 mg total) by mouth every 6 (six) hours as needed for moderate pain. 05/29/19  Yes Lindell Spar I, NP  pantoprazole (PROTONIX) 40 MG tablet Take 1 tablet (40 mg total) by mouth daily. For acid reflux 05/30/19  Yes Nwoko, Herbert Pun I, NP  predniSONE (DELTASONE) 20 MG tablet Take 3 tablets (60 mg  total) by mouth daily with breakfast for 14 days, THEN 2.5 tablets (50 mg total) daily with breakfast for 14 days, THEN 2 tablets (40 mg total) daily with breakfast for 21 days. 05/29/19 07/17/19 Yes Nwoko, Herbert Pun I, NP  nicotine (NICODERM CQ - DOSED IN MG/24 HOURS) 21 mg/24hr patch Place 1 patch (21 mg total) onto the skin daily. (May buy from over the counter): For smoking cessation 05/30/19   Lindell Spar I, NP  omeprazole (PRILOSEC) 40 MG capsule Take 1 capsule (40 mg total) by mouth daily. Patient not taking: Reported on 01/18/2018 10/17/16 08/07/18  Nita Sells, MD  sucralfate (CARAFATE) 1 g tablet Take 1 tablet (1 g total) by mouth 4 (four) times daily -  with meals and at bedtime. Patient not taking: Reported on 04/17/2018 01/18/18 08/07/18  Varney Biles, MD   Allergies    Patient has no known allergies.  Review of Systems   Review of Systems All systems reviewed and negative, other than as noted in HPI.  Physical Exam Updated Vital Signs BP (!) 144/106   Pulse (!) 110   Temp 99.1 F (37.3 C) (Oral)   Resp 17   Ht 5' 4"  (1.626 m)   Wt 68 kg   SpO2 94%   BMI 25.75 kg/m   Physical Exam Vitals and nursing note reviewed.  Constitutional:      General: He is not in acute distress.    Appearance: He is well-developed.  HENT:     Head: Normocephalic and atraumatic.  Eyes:     General:        Right eye: No discharge.        Left eye: No discharge.     Conjunctiva/sclera: Conjunctivae normal.  Cardiovascular:     Rate and Rhythm: Normal rate and regular rhythm.     Heart sounds: Normal heart sounds. No murmur. No friction rub. No gallop.   Pulmonary:     Effort: Pulmonary effort is normal. No respiratory distress.     Breath sounds: Normal breath sounds.  Abdominal:     General: There is no distension.     Palpations: Abdomen is soft.     Tenderness: There is generalized abdominal tenderness. There is no rebound.  Musculoskeletal:        General: No tenderness.      Cervical back: Neck supple.  Skin:    General: Skin is warm and dry.  Neurological:     Mental Status: He is alert.  Psychiatric:        Behavior: Behavior normal.        Thought Content: Thought content normal.     ED Results / Procedures / Treatments   Labs (all labs ordered are listed, but only abnormal results are displayed) Labs Reviewed  COMPREHENSIVE METABOLIC PANEL - Abnormal; Notable for the following components:      Result Value   AST  14 (*)    All other components within normal limits  URINALYSIS, ROUTINE W REFLEX MICROSCOPIC - Abnormal; Notable for the following components:   Color, Urine STRAW (*)    Specific Gravity, Urine 1.004 (*)    All other components within normal limits  LIPASE, BLOOD  CBC    EKG None  Radiology CT ABDOMEN PELVIS W CONTRAST  Result Date: 06/07/2019 CLINICAL DATA:  History of Crohn's disease, concern for Crohn's flare, nausea and generalized abdominal pain for 2 days EXAM: CT ABDOMEN AND PELVIS WITH CONTRAST TECHNIQUE: Multidetector CT imaging of the abdomen and pelvis was performed using the standard protocol following bolus administration of intravenous contrast. CONTRAST:  186m OMNIPAQUE IOHEXOL 300 MG/ML  SOLN COMPARISON:  CT 05/18/2019, radiograph 05/20/2019 FINDINGS: Lower chest: Lung bases are clear. Normal heart size. No pericardial effusion. Hepatobiliary: No focal liver abnormality is seen. No gallstones, gallbladder wall thickening, or biliary dilatation. Pancreas: Unremarkable. No pancreatic ductal dilatation or surrounding inflammatory changes. Spleen: Normal in size without focal abnormality. Adrenals/Urinary Tract: Adrenal glands are unremarkable. Kidneys are normal, without renal calculi, focal lesion, or hydronephrosis. Bladder is unremarkable. Stomach/Bowel: Distal esophagus, stomach and duodenum are unremarkable. There is marked inflammation involving the terminal ileum and cecum centered upon the ileocecal valve.  Redemonstration of several fistulous connections between the distal segments of the ileum and terminal ileum (5/59, 64). No extraluminal air or fluid. The appendix arises from the cecal tip and courses directly towards the inflamed loops of bowel.Borderline upstream dilatation with several small bowel loops measuring up to 3 cm in diameter with layering air-fluid levels may reflect a degree of resulting obstruction. A separate loop of circumferential thickening is seen in the mid abdomen (2/38) which could reflect a second site of inflammation. Some mild thickening in a region reactive though inflammatory tethering of the appendix in this vicinity. Distal colon is unremarkable. Vascular/Lymphatic: The aorta is normal caliber. Some reactive adenopathy present in the right lower quadrant. No pathologically enlarged abdominopelvic nodes. Reproductive: The prostate and seminal vesicles are unremarkable. Other: No free abdominopelvic fluid or gas. No bowel containing hernias. Musculoskeletal: No acute osseous abnormality or suspicious osseous lesion. Mild degenerative changes in both hips. IMPRESSION: 1. Marked inflammation involving the terminal ileum and cecum centered upon the ileocecal valve compatible with active Crohn's flare. Redemonstration of several fistulous connections between the distal segments of the ileum. The appendix arises from the cecal tip and courses directly towards the inflamed loops of bowel with possible inflammatory tethering and some likely reactive thickening. 2. Mild air and fluid distention of the upstream loops of small bowel may reflect a degree of low-grade or partial small bowel obstruction. 3. Separate focal segment of mural thickening in the mid abdomen could reflect underdistention or a second site of inflammation (2/38). Electronically Signed   By: PLovena LeM.D.   On: 06/07/2019 01:16    Procedures Procedures (including critical care time)  Medications Ordered in  ED Medications  sodium chloride flush (NS) 0.9 % injection 3 mL (0 mLs Intravenous Hold 06/06/19 2203)  lactated ringers infusion ( Intravenous New Bag/Given 06/06/19 2218)  methylPREDNISolone sodium succinate (SOLU-MEDROL) 125 mg/2 mL injection 80 mg (80 mg Intravenous Given 06/06/19 2215)  HYDROmorphone (DILAUDID) injection 1 mg (1 mg Intravenous Given 06/06/19 2215)  iohexol (OMNIPAQUE) 9 MG/ML oral solution 500 mL (500 mLs Oral Contrast Given 06/06/19 2330)    ED Course  I have reviewed the triage vital signs and the nursing notes.  Pertinent labs &  imaging results that were available during my care of the patient were reviewed by me and considered in my medical decision making (see chart for details).    MDM Rules/Calculators/A&P                      39 year old male with diffuse abdominal pain.  Recently admitted for Crohn's flare.  He feels like symptoms never got any better though.  Last CT was approximately 3 weeks ago.  We will repeat again today.  Unfortunately, she does have there may have been some confusion or other issue with his prednisone prescription.  He was supposed to be on an extended taper.  He says he ran out of it 2 days ago though.  There is a steroids given here in the emergency room.  Pain medication.  Has upcoming appointment with GI at [facility] although his appointment is not until next month.  Arbie Cookey be signed out with imaging pending.  Disposition pending patient response to treatment and CT findings.  Final Clinical Impression(s) / ED Diagnoses Final diagnoses:  Generalized abdominal pain    Rx / DC Orders ED Discharge Orders    None       Virgel Manifold, MD 06/07/19 2229

## 2019-06-07 ENCOUNTER — Encounter (HOSPITAL_COMMUNITY): Payer: Self-pay

## 2019-06-07 ENCOUNTER — Emergency Department (HOSPITAL_COMMUNITY): Payer: Medicare Other

## 2019-06-07 DIAGNOSIS — K219 Gastro-esophageal reflux disease without esophagitis: Secondary | ICD-10-CM

## 2019-06-07 DIAGNOSIS — F319 Bipolar disorder, unspecified: Secondary | ICD-10-CM | POA: Diagnosis not present

## 2019-06-07 DIAGNOSIS — F332 Major depressive disorder, recurrent severe without psychotic features: Secondary | ICD-10-CM | POA: Diagnosis not present

## 2019-06-07 DIAGNOSIS — K50012 Crohn's disease of small intestine with intestinal obstruction: Secondary | ICD-10-CM | POA: Diagnosis present

## 2019-06-07 DIAGNOSIS — F209 Schizophrenia, unspecified: Secondary | ICD-10-CM | POA: Diagnosis present

## 2019-06-07 DIAGNOSIS — R1031 Right lower quadrant pain: Secondary | ICD-10-CM | POA: Diagnosis present

## 2019-06-07 DIAGNOSIS — Z20822 Contact with and (suspected) exposure to covid-19: Secondary | ICD-10-CM | POA: Diagnosis present

## 2019-06-07 DIAGNOSIS — Z87891 Personal history of nicotine dependence: Secondary | ICD-10-CM | POA: Diagnosis not present

## 2019-06-07 DIAGNOSIS — K50913 Crohn's disease, unspecified, with fistula: Secondary | ICD-10-CM | POA: Diagnosis not present

## 2019-06-07 DIAGNOSIS — I10 Essential (primary) hypertension: Secondary | ICD-10-CM | POA: Diagnosis present

## 2019-06-07 DIAGNOSIS — F4310 Post-traumatic stress disorder, unspecified: Secondary | ICD-10-CM | POA: Diagnosis present

## 2019-06-07 DIAGNOSIS — K56609 Unspecified intestinal obstruction, unspecified as to partial versus complete obstruction: Secondary | ICD-10-CM | POA: Diagnosis not present

## 2019-06-07 DIAGNOSIS — Z79899 Other long term (current) drug therapy: Secondary | ICD-10-CM | POA: Diagnosis not present

## 2019-06-07 LAB — BASIC METABOLIC PANEL
Anion gap: 11 (ref 5–15)
BUN: 13 mg/dL (ref 6–20)
CO2: 22 mmol/L (ref 22–32)
Calcium: 8.7 mg/dL — ABNORMAL LOW (ref 8.9–10.3)
Chloride: 102 mmol/L (ref 98–111)
Creatinine, Ser: 0.86 mg/dL (ref 0.61–1.24)
GFR calc Af Amer: >60 mL/min (ref >60)
GFR calc non Af Amer: >60 mL/min (ref >60)
Glucose, Bld: 128 mg/dL — ABNORMAL HIGH (ref 70–99)
Potassium: 5.2 mmol/L — ABNORMAL HIGH (ref 3.5–5.1)
Sodium: 135 mmol/L (ref 135–145)

## 2019-06-07 LAB — LACTIC ACID, PLASMA
Lactic Acid, Venous: 2 mmol/L (ref 0.5–1.9)
Lactic Acid, Venous: 1 mmol/L (ref 0.5–1.9)

## 2019-06-07 LAB — RESPIRATORY PANEL BY RT PCR (FLU A&B, COVID)
Influenza A by PCR: NEGATIVE
Influenza B by PCR: NEGATIVE
SARS Coronavirus 2 by RT PCR: NEGATIVE

## 2019-06-07 LAB — C-REACTIVE PROTEIN: CRP: 1.3 mg/dL — ABNORMAL HIGH (ref <1.0)

## 2019-06-07 MED ORDER — ONDANSETRON HCL 4 MG/2ML IJ SOLN
4.0000 mg | Freq: Four times a day (QID) | INTRAMUSCULAR | Status: DC | PRN
  Administered 2019-06-08 – 2019-06-10 (×4): 4 mg via INTRAVENOUS
  Filled 2019-06-07 (×4): qty 2

## 2019-06-07 MED ORDER — SODIUM CHLORIDE 0.9 % IV SOLN
1.0000 g | INTRAVENOUS | Status: DC
  Administered 2019-06-07: 1 g via INTRAVENOUS
  Filled 2019-06-07: qty 1

## 2019-06-07 MED ORDER — HYDROMORPHONE HCL 1 MG/ML IJ SOLN
1.0000 mg | INTRAMUSCULAR | Status: DC | PRN
  Administered 2019-06-07 – 2019-06-10 (×18): 1 mg via INTRAVENOUS
  Filled 2019-06-07 (×18): qty 1

## 2019-06-07 MED ORDER — METRONIDAZOLE IN NACL 5-0.79 MG/ML-% IV SOLN
500.0000 mg | Freq: Three times a day (TID) | INTRAVENOUS | Status: DC
  Administered 2019-06-07 – 2019-06-08 (×3): 500 mg via INTRAVENOUS
  Filled 2019-06-07 (×3): qty 100

## 2019-06-07 MED ORDER — METHYLPREDNISOLONE SODIUM SUCC 40 MG IJ SOLR
40.0000 mg | Freq: Two times a day (BID) | INTRAMUSCULAR | Status: DC
  Administered 2019-06-07 – 2019-06-09 (×6): 40 mg via INTRAVENOUS
  Filled 2019-06-07 (×7): qty 1

## 2019-06-07 MED ORDER — PANTOPRAZOLE SODIUM 40 MG IV SOLR
40.0000 mg | Freq: Every day | INTRAVENOUS | Status: DC
  Administered 2019-06-07 (×2): 40 mg via INTRAVENOUS
  Filled 2019-06-07 (×2): qty 40

## 2019-06-07 MED ORDER — SODIUM CHLORIDE (PF) 0.9 % IJ SOLN
INTRAMUSCULAR | Status: AC
  Filled 2019-06-07: qty 50

## 2019-06-07 MED ORDER — IOHEXOL 9 MG/ML PO SOLN
500.0000 mL | ORAL | Status: AC
  Administered 2019-06-07: 1000 mL via ORAL

## 2019-06-07 MED ORDER — IOHEXOL 300 MG/ML  SOLN
100.0000 mL | Freq: Once | INTRAMUSCULAR | Status: AC | PRN
  Administered 2019-06-07: 100 mL via INTRAVENOUS

## 2019-06-07 MED ORDER — SODIUM CHLORIDE 0.9 % IV SOLN: INTRAVENOUS | Status: AC

## 2019-06-07 MED ORDER — SODIUM CHLORIDE 0.9 % IV BOLUS
1000.0000 mL | Freq: Once | INTRAVENOUS | Status: AC
  Administered 2019-06-07: 1000 mL via INTRAVENOUS

## 2019-06-07 NOTE — H&P (Signed)
History and Physical    Jeffery Mcintosh NAT:[id number] DOB: [birth year] DOA: 06/06/2019  PCP: Patient, No Pcp Per Patient coming from: Home  Chief Complaint: Nausea, abdominal pain  HPI: Jeffery Mcintosh is a 39 y.o. male with medical history significant of depression and bipolar disorder with multiple suicide attempts, Crohn's disease, GERD, recent hospital admission on 4/15 after he had a suicide attempt at home and found to be in Crohn's flare on admission and there was concern for low-grade partial SBO.  His Crohn's disease exacerbation was managed conservatively and he was started on a long taper course of prednisone.  Discharged from inpatient psychiatry on 4/25.  Now presenting with complaints of nausea and abdominal pain.  Patient states he has had severe right sided abdominal pain for the past 2 days, especially worse in the right lower quadrant.  Reports having small amounts of watery diarrhea.  He has been feeling nauseous but has not vomited.  Denies fevers or chills.  Denies cough, shortness of breath, or chest pain.  States he has an appointment with a gastroenterologist in [place] in June.  ED Course: Afebrile.  Slightly tachycardic on arrival.  No leukocytosis.  Lipase and LFTs normal.  UA not suggestive of infection.  SARS-CoV-2 PCR test pending.  CT abdomen pelvis with contrast showing: IMPRESSION: 1. Marked inflammation involving the terminal ileum and cecum centered upon the ileocecal valve compatible with active Crohn's flare. Redemonstration of several fistulous connections between the distal segments of the ileum. The appendix arises from the cecal tip and courses directly towards the inflamed loops of bowel with possible inflammatory tethering and some likely reactive thickening. 2. Mild air and fluid distention of the upstream loops of small bowel may reflect a degree of low-grade or partial small bowel obstruction. 3. Separate focal segment of mural  thickening in the mid abdomen could reflect underdistention or a second site of inflammation (2/38).  Patient received Dilaudid, Toradol, Solu-Medrol 80 mg, and was started on IV fluid infusion.  Review of Systems:  All systems reviewed and apart from history of presenting illness, are negative.  Past Medical History:  Diagnosis Date  . Anxiety   . Arthritis   . Bipolar affective ([place])   . Chronic headaches   . Crohn's disease ([place]) history of  . Depression   . GERD (gastroesophageal reflux disease)   . Hypertension   . Noncompliance 04/17/2018  . PTSD (post-traumatic stress disorder) 04/17/2018  . Schizophrenic disorder [facility])     Past Surgical History:  Procedure Laterality Date  . NO PAST SURGERIES       reports that he has quit smoking. His smoking use included cigars. He has a 7.50 pack-year smoking history. He has never used smokeless tobacco. He reports that he does not drink alcohol or use drugs.  No Known Allergies  Family History  Problem Relation Age of Onset  . Hypertension Mother   . Heart attack Mother   . Stroke Mother   . Alcohol abuse Father   . Mental illness Father   . Multiple sclerosis Father   . Arthritis Maternal Grandmother   . Hypertension Maternal Grandmother   . Heart disease Maternal Grandmother   . Kidney disease Maternal Grandmother   . Diabetes Maternal Grandmother   . Cancer Maternal Grandmother        great, type unknown  . Hyperlipidemia Maternal Grandfather   . Colon polyps Maternal Grandfather   . Stroke Paternal Grandmother   . Ulcerative colitis Maternal Aunt  Prior to Admission medications   Medication Sig Start Date End Date Taking? Authorizing Provider  carbamazepine (TEGRETOL) 100 MG chewable tablet Chew 1 tablet (100 mg total) by mouth 2 (two) times daily. For mood stabilization 05/29/19  Yes Nwoko, Herbert Pun I, NP  FLUoxetine (PROZAC) 40 MG capsule Take 1 capsule (40 mg total) by mouth daily. For depression 05/30/19  Yes  Nwoko, Herbert Pun I, NP  oxyCODONE 10 MG TABS Take 1 tablet (10 mg total) by mouth every 6 (six) hours as needed for moderate pain. 05/29/19  Yes Lindell Spar I, NP  pantoprazole (PROTONIX) 40 MG tablet Take 1 tablet (40 mg total) by mouth daily. For acid reflux 05/30/19  Yes Nwoko, Herbert Pun I, NP  nicotine (NICODERM CQ - DOSED IN MG/24 HOURS) 21 mg/24hr patch Place 1 patch (21 mg total) onto the skin daily. (May buy from over the counter): For smoking cessation 05/30/19   Lindell Spar I, NP  oxyCODONE-acetaminophen (PERCOCET/ROXICET) 5-325 MG tablet Take 1 tablet by mouth every 6 (six) hours as needed for severe pain. 06/06/19   Virgel Manifold, MD  predniSONE (DELTASONE) 20 MG tablet Take 3 tablets (60 mg total) by mouth daily with breakfast for 14 days, THEN 2.5 tablets (50 mg total) daily with breakfast for 14 days, THEN 2 tablets (40 mg total) daily with breakfast for 21 days. 06/06/19 07/25/19  Virgel Manifold, MD  omeprazole (PRILOSEC) 40 MG capsule Take 1 capsule (40 mg total) by mouth daily. Patient not taking: Reported on 01/18/2018 10/17/16 08/07/18  Nita Sells, MD  sucralfate (CARAFATE) 1 g tablet Take 1 tablet (1 g total) by mouth 4 (four) times daily -  with meals and at bedtime. Patient not taking: Reported on 04/17/2018 01/18/18 08/07/18  Varney Biles, MD    Physical Exam: Vitals:   06/06/19 2003 06/06/19 2200 06/06/19 2345 06/07/19 0030  BP: (!) 149/87 (!) 144/106 (!) 149/89 (!) 144/84  Pulse: (!) 115 (!) 110 90 98  Resp: 16 17 18 20   Temp: 99.1 F (37.3 C)     TempSrc: Oral     SpO2: 95% 94% 95% 91%  Weight: 68 kg     Height: 5' 4"  (1.626 m)       Physical Exam  Constitutional: He is oriented to person, place, and time. He appears well-developed and well-nourished. No distress.  HENT:  Head: Normocephalic.  Eyes: Right eye exhibits no discharge. Left eye exhibits no discharge.  Cardiovascular: Normal rate, regular rhythm and intact distal pulses.  Pulmonary/Chest: Effort normal  and breath sounds normal. No respiratory distress. He has no wheezes. He has no rales.  Abdominal: Soft. Bowel sounds are normal. He exhibits no distension. There is abdominal tenderness. There is guarding. There is no rebound.  Right lower quadrant tender to palpation with guarding  Musculoskeletal:        General: No edema.     Cervical back: Neck supple.  Neurological: He is alert and oriented to person, place, and time.  Skin: Skin is warm and dry. He is not diaphoretic.    Labs on Admission: I have personally reviewed following labs and imaging studies  CBC: Recent Labs  Lab 06/06/19 2020  WBC 7.3  HGB 15.0  HCT 42.6  MCV 88.8  PLT 229   Basic Metabolic Panel: Recent Labs  Lab 06/06/19 2020  NA 139  K 3.8  CL 103  CO2 23  GLUCOSE 92  BUN 11  CREATININE 0.79  CALCIUM 9.3   GFR: Estimated Creatinine Clearance:  105.9 mL/min (by C-G formula based on SCr of 0.79 mg/dL). Liver Function Tests: Recent Labs  Lab 06/06/19 2020  AST 14*  ALT 34  ALKPHOS 111  BILITOT 0.7  PROT 7.3  ALBUMIN 4.0   Recent Labs  Lab 06/06/19 2020  LIPASE 40   No results for input(s): AMMONIA in the last 168 hours. Coagulation Profile: No results for input(s): INR, PROTIME in the last 168 hours. Cardiac Enzymes: No results for input(s): CKTOTAL, CKMB, CKMBINDEX, TROPONINI in the last 168 hours. BNP (last 3 results) No results for input(s): PROBNP in the last 8760 hours. HbA1C: No results for input(s): HGBA1C in the last 72 hours. CBG: No results for input(s): GLUCAP in the last 168 hours. Lipid Profile: No results for input(s): CHOL, HDL, LDLCALC, TRIG, CHOLHDL, LDLDIRECT in the last 72 hours. Thyroid Function Tests: No results for input(s): TSH, T4TOTAL, FREET4, T3FREE, THYROIDAB in the last 72 hours. Anemia Panel: No results for input(s): VITAMINB12, FOLATE, FERRITIN, TIBC, IRON, RETICCTPCT in the last 72 hours. Urine analysis:    Component Value Date/Time   COLORURINE  STRAW (A) 06/06/2019 2020   APPEARANCEUR CLEAR 06/06/2019 2020   LABSPEC 1.004 (L) 06/06/2019 2020   PHURINE 5.0 06/06/2019 2020   GLUCOSEU NEGATIVE 06/06/2019 2020   GLUCOSEU NEGATIVE 04/18/2013 1418   HGBUR NEGATIVE 06/06/2019 2020   [place] NEGATIVE 06/06/2019 2020   KETONESUR NEGATIVE 06/06/2019 2020   PROTEINUR NEGATIVE 06/06/2019 2020   UROBILINOGEN 1.0 11/16/2014 1719   NITRITE NEGATIVE 06/06/2019 2020   LEUKOCYTESUR NEGATIVE 06/06/2019 2020    Radiological Exams on Admission: CT ABDOMEN PELVIS W CONTRAST  Result Date: 06/07/2019 CLINICAL DATA:  History of Crohn's disease, concern for Crohn's flare, nausea and generalized abdominal pain for 2 days EXAM: CT ABDOMEN AND PELVIS WITH CONTRAST TECHNIQUE: Multidetector CT imaging of the abdomen and pelvis was performed using the standard protocol following bolus administration of intravenous contrast. CONTRAST:  143m OMNIPAQUE IOHEXOL 300 MG/ML  SOLN COMPARISON:  CT 05/18/2019, radiograph 05/20/2019 FINDINGS: Lower chest: Lung bases are clear. Normal heart size. No pericardial effusion. Hepatobiliary: No focal liver abnormality is seen. No gallstones, gallbladder wall thickening, or biliary dilatation. Pancreas: Unremarkable. No pancreatic ductal dilatation or surrounding inflammatory changes. Spleen: Normal in size without focal abnormality. Adrenals/Urinary Tract: Adrenal glands are unremarkable. Kidneys are normal, without renal calculi, focal lesion, or hydronephrosis. Bladder is unremarkable. Stomach/Bowel: Distal esophagus, stomach and duodenum are unremarkable. There is marked inflammation involving the terminal ileum and cecum centered upon the ileocecal valve. Redemonstration of several fistulous connections between the distal segments of the ileum and terminal ileum (5/59, 64). No extraluminal air or fluid. The appendix arises from the cecal tip and courses directly towards the inflamed loops of bowel.Borderline upstream dilatation  with several small bowel loops measuring up to 3 cm in diameter with layering air-fluid levels may reflect a degree of resulting obstruction. A separate loop of circumferential thickening is seen in the mid abdomen (2/38) which could reflect a second site of inflammation. Some mild thickening in a region reactive though inflammatory tethering of the appendix in this vicinity. Distal colon is unremarkable. Vascular/Lymphatic: The aorta is normal caliber. Some reactive adenopathy present in the right lower quadrant. No pathologically enlarged abdominopelvic nodes. Reproductive: The prostate and seminal vesicles are unremarkable. Other: No free abdominopelvic fluid or gas. No bowel containing hernias. Musculoskeletal: No acute osseous abnormality or suspicious osseous lesion. Mild degenerative changes in both hips. IMPRESSION: 1. Marked inflammation involving the terminal ileum and cecum centered  upon the ileocecal valve compatible with active Crohn's flare. Redemonstration of several fistulous connections between the distal segments of the ileum. The appendix arises from the cecal tip and courses directly towards the inflamed loops of bowel with possible inflammatory tethering and some likely reactive thickening. 2. Mild air and fluid distention of the upstream loops of small bowel may reflect a degree of low-grade or partial small bowel obstruction. 3. Separate focal segment of mural thickening in the mid abdomen could reflect underdistention or a second site of inflammation (2/38). Electronically Signed   By: Lovena Le M.D.   On: 06/07/2019 01:16    Assessment/Plan Principal Problem:   Exacerbation of Crohn's disease ([place]) Active Problems:   SBO (small bowel obstruction) ([facility])   Bipolar 1 disorder ([facility])   MDD (major depressive disorder), recurrent severe, without psychosis ([place])   GERD (gastroesophageal reflux disease)   Fistulizing Crohn's disease with acute exacerbation: Afebrile.  No leukocytosis.   Slightly tachycardic on arrival, now resolved.  No signs of sepsis at this time.  CT showing marked inflammation involving the terminal ileum and cecum centered upon the ileocecal valve compatible with active Crohn's flare. Redemonstration of several fistulous connections between the distal segments of the ileum. The appendix arises from the cecal tip and courses directly towards the inflamed loops of bowel with possible inflammatory tethering and some likely reactive thickening. Separate focal segment of mural thickening in the mid abdomen could reflect underdistention or a second site of inflammation. -Continue Solu-Medrol 40 mg every 12 hours.  Keep n.p.o.  IV fluid hydration.  Dilaudid as needed for pain.  Check lactic acid level.  Consult GI in a.m.  Low-grade or partial SBO: CT showing mild air and fluid distention of the upstream loops of small bowel may reflect a degree of low-grade or partial small bowel obstruction. Patient is complaining of nausea but no vomiting.  Continues to have bowel movements.  Keep n.p.o. at this time.  Antiemetic as needed.  Consult GI in a.m. May also need to consult general surgery in a.m., pending GI evaluation.  GERD: IV Protonix 40 mg daily.  MDD and bipolar disorder: Continue home meds when no longer n.p.o.  DVT prophylaxis: SCDs at this time Code Status: Full code Family Communication: No family available at this time. Disposition Plan: Status is: Inpatient  Remains inpatient appropriate because:IV treatments appropriate due to intensity of illness or inability to take PO   Dispo: The patient is from: Home              Anticipated d/c is to: Home              Anticipated d/c date is: > 3 days              Patient currently is not medically stable to d/c.  The medical decision making on this patient was of high complexity and the patient is at high risk for clinical deterioration, therefore this is a level 3 visit.  Shela Leff MD [facility]  If 7PM-7AM, please contact night-coverage [email or web address]  06/07/2019, 2:18 AM

## 2019-06-07 NOTE — ED Notes (Signed)
ED TO INPATIENT HANDOFF REPORT  ED Nurse Name and Phone #: [phone]  S Name/Age/Gender Jeffery Mcintosh 39 y.o. male Room/Bed: WA25/WA25  Code Status   Code Status: Full Code  Home/SNF/Other Home Patient oriented to: self, place, time and situation Is this baseline? Yes   Triage Complete: Triage complete  Chief Complaint Exacerbation of Crohn's disease ([place]) [K50.90]  Triage Note Pt reporting chron's flare up. Nausea, generalized abdominal pain for 2 days.     Allergies No Known Allergies  Level of Care/Admitting Diagnosis ED Disposition    ED Disposition Condition [facility] Area: [place] [[id number]]  Level of Care: Med-Surg [16]  May admit patient to [name] or [name] if equivalent level of care is available:: Yes  Covid Evaluation: Asymptomatic Screening Protocol (No Symptoms)  Diagnosis: Exacerbation of Crohn's disease [facility]) [[id number]]  Admitting Physician: Shela Leff [[id number]]  Attending Physician: Shela Leff [[id number]]  Estimated length of stay: past midnight tomorrow  Certification:: I certify this patient will need inpatient services for at least 2 midnights       B Medical/Surgery History Past Medical History:  Diagnosis Date  . Anxiety   . Arthritis   . Bipolar affective ([place])   . Chronic headaches   . Crohn's disease ([place]) history of  . Depression   . GERD (gastroesophageal reflux disease)   . Hypertension   . Noncompliance 04/17/2018  . PTSD (post-traumatic stress disorder) 04/17/2018  . Schizophrenic disorder [facility])    Past Surgical History:  Procedure Laterality Date  . NO PAST SURGERIES       A IV Location/Drains/Wounds Patient Lines/Drains/Airways Status   Active Line/Drains/Airways    Name:   Placement date:   Placement time:   Site:   Days:   Peripheral IV 06/06/19 Left Antecubital   06/06/19    2157    Antecubital   1          Intake/Output Last 24 hours No intake  or output data in the 24 hours ending 06/07/19 1428  Labs/Imaging Results for orders placed or performed during the hospital encounter of 06/06/19 (from the past 48 hour(s))  Lipase, blood     Status: None   Collection Time: 06/06/19  8:20 PM  Result Value Ref Range   Lipase 40 11 - 51 U/L    Comment: Performed at [facility], [place] [address], [place], [place] [address]  Comprehensive metabolic panel     Status: Abnormal   Collection Time: 06/06/19  8:20 PM  Result Value Ref Range   Sodium 139 135 - 145 mmol/L   Potassium 3.8 3.5 - 5.1 mmol/L   Chloride 103 98 - 111 mmol/L   CO2 23 22 - 32 mmol/L   Glucose, Bld 92 70 - 99 mg/dL    Comment: Glucose reference range applies only to samples taken after fasting for at least 8 hours.   BUN 11 6 - 20 mg/dL   Creatinine, Ser 0.79 0.61 - 1.24 mg/dL   Calcium 9.3 8.9 - 10.3 mg/dL   Total Protein 7.3 6.5 - 8.1 g/dL   Albumin 4.0 3.5 - 5.0 g/dL   AST 14 (L) 15 - 41 U/L   ALT 34 0 - 44 U/L   Alkaline Phosphatase 111 38 - 126 U/L   Total Bilirubin 0.7 0.3 - 1.2 mg/dL   GFR calc non Af Amer >60 >60 mL/min   GFR calc Af Amer >60 >60 mL/min  Anion gap 13 5 - 15    Comment: Performed at [facility], [place] [address] [place], [place] [address]  CBC     Status: None   Collection Time: 06/06/19  8:20 PM  Result Value Ref Range   WBC 7.3 4.0 - 10.5 K/uL   RBC 4.80 4.22 - 5.81 MIL/uL   Hemoglobin 15.0 13.0 - 17.0 g/dL   HCT 42.6 39.0 - 52.0 %   MCV 88.8 80.0 - 100.0 fL   MCH 31.3 26.0 - 34.0 pg   MCHC 35.2 30.0 - 36.0 g/dL   RDW 14.8 11.5 - 15.5 %   Platelets 334 150 - 400 K/uL   nRBC 0.0 0.0 - 0.2 %    Comment: Performed at [facility], [place] [address], [place], [place] [address]  Urinalysis, Routine w reflex microscopic     Status: Abnormal   Collection Time: 06/06/19  8:20 PM  Result Value Ref Range   Color, Urine STRAW (A) YELLOW   APPearance CLEAR CLEAR   Specific Gravity,  Urine 1.004 (L) 1.005 - 1.030   pH 5.0 5.0 - 8.0   Glucose, UA NEGATIVE NEGATIVE mg/dL   Hgb urine dipstick NEGATIVE NEGATIVE   Bilirubin Urine NEGATIVE NEGATIVE   Ketones, ur NEGATIVE NEGATIVE mg/dL   Protein, ur NEGATIVE NEGATIVE mg/dL   Nitrite NEGATIVE NEGATIVE   Leukocytes,Ua NEGATIVE NEGATIVE    Comment: Performed at [place] [address] [place],  [address]  Respiratory Panel by RT PCR (Flu A&B, Covid) - Nasopharyngeal Swab     Status: None   Collection Time: 06/07/19  2:03 AM   Specimen: Nasopharyngeal Swab  Result Value Ref Range   SARS Coronavirus 2 by RT PCR NEGATIVE NEGATIVE    Comment: (NOTE) SARS-CoV-2 target nucleic acids are NOT DETECTED. The SARS-CoV-2 RNA is generally detectable in upper respiratoy specimens during the acute phase of infection. The lowest concentration of SARS-CoV-2 viral copies this assay can detect is 131 copies/mL. A negative result does not preclude SARS-Cov-2 infection and should not be used as the sole basis for treatment or other patient management decisions. A negative result may occur with  improper specimen collection/handling, submission of specimen other than nasopharyngeal swab, presence of viral mutation(s) within the areas targeted by this assay, and inadequate number of viral copies (<131 copies/mL). A negative result must be combined with clinical observations, patient history, and epidemiological information. The expected result is Negative. Fact Sheet for Patients:  PinkCheek.be Fact Sheet for Healthcare Providers:  GravelBags.it This test is not yet ap proved or cleared by the [place] FDA and  has been authorized for detection and/or diagnosis of SARS-CoV-2 by FDA under an Emergency Use Authorization (EUA). This EUA will remain  in effect (meaning this test can be used) for the duration of the COVID-19 declaration under Section  564(b)(1) of the Act, 21 U.S.C. section 360bbb-3(b)(1), unless the authorization is terminated or revoked sooner.    Influenza A by PCR NEGATIVE NEGATIVE   Influenza B by PCR NEGATIVE NEGATIVE    Comment: (NOTE) The Xpert Xpress SARS-CoV-2/FLU/RSV assay is intended as an aid in  the diagnosis of influenza from Nasopharyngeal swab specimens and  should not be used as a sole basis for treatment. Nasal washings and  aspirates are unacceptable for Xpert Xpress SARS-CoV-2/FLU/RSV  testing. Fact Sheet for Patients: PinkCheek.be Fact Sheet for Healthcare Providers: GravelBags.it This test is not yet approved or cleared by the [place] FDA  and  has been authorized for detection and/or diagnosis of SARS-CoV-2 by  FDA under an Emergency Use Authorization (EUA). This EUA will remain  in effect (meaning this test can be used) for the duration of the  Covid-19 declaration under Section 564(b)(1) of the Act, 21  U.S.C. section 360bbb-3(b)(1), unless the authorization is  terminated or revoked. Performed at [facility], [place] [address], [place], [place] [address]   Lactic acid, plasma     Status: Abnormal   Collection Time: 06/07/19  2:22 AM  Result Value Ref Range   Lactic Acid, Venous 2.0 (HH) 0.5 - 1.9 mmol/L    Comment: CRITICAL RESULT CALLED TO, READ BACK BY AND VERIFIED WITH: [place], RN @ [phone] ON 06/07/19 Sandy Salaam Performed at [facility], [place] [address] [place], [place] [address]   Basic metabolic panel     Status: Abnormal   Collection Time: 06/07/19  4:33 AM  Result Value Ref Range   Sodium 135 135 - 145 mmol/L   Potassium 5.2 (H) 3.5 - 5.1 mmol/L    Comment: DELTA CHECK NOTED   Chloride 102 98 - 111 mmol/L   CO2 22 22 - 32 mmol/L   Glucose, Bld 128 (H) 70 - 99 mg/dL    Comment: Glucose reference range applies only to samples taken after fasting for at least 8 hours.   BUN 13 6 -  20 mg/dL   Creatinine, Ser 0.86 0.61 - 1.24 mg/dL   Calcium 8.7 (L) 8.9 - 10.3 mg/dL   GFR calc non Af Amer >60 >60 mL/min   GFR calc Af Amer >60 >60 mL/min   Anion gap 11 5 - 15    Comment: Performed at [facility], [place] [address] [place], [place] [address]  Lactic acid, plasma     Status: None   Collection Time: 06/07/19  5:27 AM  Result Value Ref Range   Lactic Acid, Venous 1.0 0.5 - 1.9 mmol/L    Comment: Performed at [facility], [place] [address], [place],  [address]   CT ABDOMEN PELVIS W CONTRAST  Result Date: 06/07/2019 CLINICAL DATA:  History of Crohn's disease, concern for Crohn's flare, nausea and generalized abdominal pain for 2 days EXAM: CT ABDOMEN AND PELVIS WITH CONTRAST TECHNIQUE: Multidetector CT imaging of the abdomen and pelvis was performed using the standard protocol following bolus administration of intravenous contrast. CONTRAST:  129m OMNIPAQUE IOHEXOL 300 MG/ML  SOLN COMPARISON:  CT 05/18/2019, radiograph 05/20/2019 FINDINGS: Lower chest: Lung bases are clear. Normal heart size. No pericardial effusion. Hepatobiliary: No focal liver abnormality is seen. No gallstones, gallbladder wall thickening, or biliary dilatation. Pancreas: Unremarkable. No pancreatic ductal dilatation or surrounding inflammatory changes. Spleen: Normal in size without focal abnormality. Adrenals/Urinary Tract: Adrenal glands are unremarkable. Kidneys are normal, without renal calculi, focal lesion, or hydronephrosis. Bladder is unremarkable. Stomach/Bowel: Distal esophagus, stomach and duodenum are unremarkable. There is marked inflammation involving the terminal ileum and cecum centered upon the ileocecal valve. Redemonstration of several fistulous connections between the distal segments of the ileum and terminal ileum (5/59, 64). No extraluminal air or fluid. The appendix arises from the cecal tip and courses directly towards the inflamed loops of  bowel.Borderline upstream dilatation with several small bowel loops measuring up to 3 cm in diameter with layering air-fluid levels may reflect a degree of resulting obstruction. A separate loop of circumferential thickening is seen in the mid abdomen (2/38) which could reflect a second site  of inflammation. Some mild thickening in a region reactive though inflammatory tethering of the appendix in this vicinity. Distal colon is unremarkable. Vascular/Lymphatic: The aorta is normal caliber. Some reactive adenopathy present in the right lower quadrant. No pathologically enlarged abdominopelvic nodes. Reproductive: The prostate and seminal vesicles are unremarkable. Other: No free abdominopelvic fluid or gas. No bowel containing hernias. Musculoskeletal: No acute osseous abnormality or suspicious osseous lesion. Mild degenerative changes in both hips. IMPRESSION: 1. Marked inflammation involving the terminal ileum and cecum centered upon the ileocecal valve compatible with active Crohn's flare. Redemonstration of several fistulous connections between the distal segments of the ileum. The appendix arises from the cecal tip and courses directly towards the inflamed loops of bowel with possible inflammatory tethering and some likely reactive thickening. 2. Mild air and fluid distention of the upstream loops of small bowel may reflect a degree of low-grade or partial small bowel obstruction. 3. Separate focal segment of mural thickening in the mid abdomen could reflect underdistention or a second site of inflammation (2/38). Electronically Signed   By: Lovena Le M.D.   On: 06/07/2019 01:16    Pending Labs Unresulted Labs (From admission, onward)    Start     Ordered   06/08/19 0500  CBC  Daily,   R     06/07/19 0746   06/08/19 1517  Basic metabolic panel  Daily,   R     06/07/19 0746   06/08/19 0500  C-reactive protein  Daily,   R     06/07/19 0746   06/08/19 0500  QuantiFERON-TB Gold Plus  Tomorrow morning,    R     06/07/19 1053   06/08/19 0500  Hepatitis B surface antigen  Tomorrow morning,   R     06/07/19 1053   06/08/19 0500  Sedimentation rate  Tomorrow morning,   R     06/07/19 1341   06/07/19 0747  C-reactive protein  Add-on,   AD     06/07/19 0746          Vitals/Pain Today's Vitals   06/07/19 0910 06/07/19 1204 06/07/19 1204 06/07/19 1400  BP:  (!) 157/99  140/87  Pulse:  82  80  Resp:  16  16  Temp:      TempSrc:      SpO2:  96%  95%  Weight:      Height:      PainSc: 7   7      Isolation Precautions No active isolations  Medications Medications  sodium chloride flush (NS) 0.9 % injection 3 mL (0 mLs Intravenous Hold 06/06/19 2203)  sodium chloride (PF) 0.9 % injection (has no administration in time range)  iohexol (OMNIPAQUE) 9 MG/ML oral solution 500 mL (1,000 mLs Oral Contrast Given 06/07/19 0048)  HYDROmorphone (DILAUDID) injection 1 mg (1 mg Intravenous Given 06/07/19 1208)  0.9 %  sodium chloride infusion ( Intravenous Stopped 06/07/19 0915)  methylPREDNISolone sodium succinate (SOLU-MEDROL) 40 mg/mL injection 40 mg (40 mg Intravenous Given 06/07/19 0910)  ondansetron (ZOFRAN) injection 4 mg (has no administration in time range)  pantoprazole (PROTONIX) injection 40 mg (40 mg Intravenous Given 06/07/19 0241)  methylPREDNISolone sodium succinate (SOLU-MEDROL) 125 mg/2 mL injection 80 mg (80 mg Intravenous Given 06/06/19 2215)  HYDROmorphone (DILAUDID) injection 1 mg (1 mg Intravenous Given 06/06/19 2215)  iohexol (OMNIPAQUE) 9 MG/ML oral solution 500 mL (500 mLs Oral Contrast Given 06/06/19 2330)  ketorolac (TORADOL) 15 MG/ML injection 15 mg (15 mg Intravenous Given  06/06/19 2344)  iohexol (OMNIPAQUE) 300 MG/ML solution 100 mL (100 mLs Intravenous Contrast Given 06/07/19 0047)  sodium chloride 0.9 % bolus 1,000 mL (0 mLs Intravenous Stopped 06/07/19 0915)    Mobility walks Low fall risk   Focused Assessments abdominal    R Recommendations: See Admitting Provider  Note  Report given to:   Additional Notes:

## 2019-06-07 NOTE — Progress Notes (Signed)
PROGRESS NOTE    Jeffery Mcintosh  TDV:[id number] DOB: Apr 10, 1980 DOA: 06/06/2019 PCP: Patient, No Pcp Per    Brief Narrative:  39 y.o. male with medical history significant of HTN, schizophrenia, BPD, longstanding Crohn's disease who presented to ED and nausea and right lower quadrant abdominal pain, diarrhea x2 days.  Recent hospitalization 05/19/2019---05/29/2019 to psychiatric for suicidal ideation.  He was also treated for Crohn disease flare and discharged in a long taper of prednisone.  He reports he ran out of of the prednisone.  In ED vital signs notable for tachycardia to 115 bpm, and lipase, LFTs unremarkable.  WBC 7K, Hb 15, platelets 334.  Lactic acid 2-->1.  CT abdomen with marked inflammation involving the terminal ileum and cecum compatible with active Crohn's flare with redemonstration of several fistulous connections between the distal segments of the ileum.  Mild air and fluid distention of the upstream loops of small bowel that may reflect degree of low-grade partial SBO.  He was started on Solu-Medrol IV 40 mg twice daily.  Seen by GI.  Considering biological agents given fistulizing disease.  He received Remicade for 2 doses, several years ago, stopped for unclear reason, he thinks was likely due to relocation from [place], was on Humira for a year and on Cimzia for several months without change in symptoms.  Assessment & Plan:   Principal Problem:   Exacerbation of Crohn's disease ([place]) Active Problems:   SBO (small bowel obstruction) ([facility])   Bipolar 1 disorder ([facility])   MDD (major depressive disorder), recurrent severe, without psychosis ([place])   GERD (gastroesophageal reflux disease)  #Crohn disease flare -Continue with Solu-Medrol 40 mg twice daily. -GI considering initiation of biological agents given fistulizing disease.  Ordered QuantiFERON and hep B surface antigen. -Ceftriaxone and Flagyl.   #Low-grade SBO. -We will keep n.p.o. for now.  As needed  antiemetics.  #Bipolar disorder -Continue with Tegretol, Prozac   DVT prophylaxis: Subcu heparin   code Status: Full code Family Communication: None Disposition Plan:  Status is: Inpatient  Remains inpatient appropriate because:Inpatient level of care appropriate due to severity of illness   Dispo: The patient is from: Home              Anticipated d/c is to: Home              Anticipated d/c date is: 3 days              Patient currently is not medically stable to d/c.          Consultants:   GI   Procedures: None   Antimicrobials: Ceftriaxone and Flagyl    Subjective: Patient continues to report of diffuse abdominal pain.  Objective: Vitals:   06/07/19 0900 06/07/19 1204 06/07/19 1400 06/07/19 1450  BP: 136/88 (!) 157/99 140/87 (!) 153/79  Pulse: 85 82 80 81  Resp: 15 16 16 17   Temp:    98.1 F (36.7 C)  TempSrc:      SpO2: 93% 96% 95% 99%  Weight:      Height:       No intake or output data in the 24 hours ending 06/07/19 1558 Filed Weights   06/06/19 2003  Weight: 68 kg    Examination:  General exam: Appears calm and comfortable  Respiratory system: Clear to auscultation. Respiratory effort normal. Cardiovascular system: S1 & S2 heard, RRR. No JVD, murmurs, rubs, gallops or clicks. No pedal edema. Gastrointestinal system: Abdomen is nondistended, soft and nontender. No organomegaly  or masses felt. Normal bowel sounds heard.  Right lower quadrant abdominal pain with guarding and some rebound present.  Central nervous system: Alert and oriented. No focal neurological deficits. Extremities: Symmetric 5 x 5 power. Skin: No rashes, lesions or ulcers Psychiatry: Judgement and insight appear normal. Mood & affect appropriate.     Data Reviewed: I have personally reviewed following labs and imaging studies  CBC: Recent Labs  Lab 06/06/19 2020  WBC 7.3  HGB 15.0  HCT 42.6  MCV 88.8  PLT 749   Basic Metabolic Panel: Recent Labs  Lab  06/06/19 2020 06/07/19 0433  NA 139 135  K 3.8 5.2*  CL 103 102  CO2 23 22  GLUCOSE 92 128*  BUN 11 13  CREATININE 0.79 0.86  CALCIUM 9.3 8.7*   GFR: Estimated Creatinine Clearance: 98.5 mL/min (by C-G formula based on SCr of 0.86 mg/dL). Liver Function Tests: Recent Labs  Lab 06/06/19 2020  AST 14*  ALT 34  ALKPHOS 111  BILITOT 0.7  PROT 7.3  ALBUMIN 4.0   Recent Labs  Lab 06/06/19 2020  LIPASE 40   No results for input(s): AMMONIA in the last 168 hours. Coagulation Profile: No results for input(s): INR, PROTIME in the last 168 hours. Cardiac Enzymes: No results for input(s): CKTOTAL, CKMB, CKMBINDEX, TROPONINI in the last 168 hours. BNP (last 3 results) No results for input(s): PROBNP in the last 8760 hours. HbA1C: No results for input(s): HGBA1C in the last 72 hours. CBG: No results for input(s): GLUCAP in the last 168 hours. Lipid Profile: No results for input(s): CHOL, HDL, LDLCALC, TRIG, CHOLHDL, LDLDIRECT in the last 72 hours. Thyroid Function Tests: No results for input(s): TSH, T4TOTAL, FREET4, T3FREE, THYROIDAB in the last 72 hours. Anemia Panel: No results for input(s): VITAMINB12, FOLATE, FERRITIN, TIBC, IRON, RETICCTPCT in the last 72 hours. Sepsis Labs: Recent Labs  Lab 06/07/19 0222 06/07/19 0527  LATICACIDVEN 2.0* 1.0    Recent Results (from the past 240 hour(s))  Respiratory Panel by RT PCR (Flu A&B, Covid) - Nasopharyngeal Swab     Status: None   Collection Time: 06/07/19  2:03 AM   Specimen: Nasopharyngeal Swab  Result Value Ref Range Status   SARS Coronavirus 2 by RT PCR NEGATIVE NEGATIVE Final    Comment: (NOTE) SARS-CoV-2 target nucleic acids are NOT DETECTED. The SARS-CoV-2 RNA is generally detectable in upper respiratoy specimens during the acute phase of infection. The lowest concentration of SARS-CoV-2 viral copies this assay can detect is 131 copies/mL. A negative result does not preclude SARS-Cov-2 infection and should not  be used as the sole basis for treatment or other patient management decisions. A negative result may occur with  improper specimen collection/handling, submission of specimen other than nasopharyngeal swab, presence of viral mutation(s) within the areas targeted by this assay, and inadequate number of viral copies (<131 copies/mL). A negative result must be combined with clinical observations, patient history, and epidemiological information. The expected result is Negative. Fact Sheet for Patients:  PinkCheek.be Fact Sheet for Healthcare Providers:  GravelBags.it This test is not yet ap proved or cleared by the [place] FDA and  has been authorized for detection and/or diagnosis of SARS-CoV-2 by FDA under an Emergency Use Authorization (EUA). This EUA will remain  in effect (meaning this test can be used) for the duration of the COVID-19 declaration under Section 564(b)(1) of the Act, 21 U.S.C. section 360bbb-3(b)(1), unless the authorization is terminated or revoked sooner.    Influenza  A by PCR NEGATIVE NEGATIVE Final   Influenza B by PCR NEGATIVE NEGATIVE Final    Comment: (NOTE) The Xpert Xpress SARS-CoV-2/FLU/RSV assay is intended as an aid in  the diagnosis of influenza from Nasopharyngeal swab specimens and  should not be used as a sole basis for treatment. Nasal washings and  aspirates are unacceptable for Xpert Xpress SARS-CoV-2/FLU/RSV  testing. Fact Sheet for Patients: PinkCheek.be Fact Sheet for Healthcare Providers: GravelBags.it This test is not yet approved or cleared by the [place] FDA and  has been authorized for detection and/or diagnosis of SARS-CoV-2 by  FDA under an Emergency Use Authorization (EUA). This EUA will remain  in effect (meaning this test can be used) for the duration of the  Covid-19 declaration under Section 564(b)(1) of  the Act, 21  U.S.C. section 360bbb-3(b)(1), unless the authorization is  terminated or revoked. Performed at [facility], [place] [address], [place], [place] [address]          Radiology Studies: CT ABDOMEN PELVIS W CONTRAST  Result Date: 06/07/2019 CLINICAL DATA:  History of Crohn's disease, concern for Crohn's flare, nausea and generalized abdominal pain for 2 days EXAM: CT ABDOMEN AND PELVIS WITH CONTRAST TECHNIQUE: Multidetector CT imaging of the abdomen and pelvis was performed using the standard protocol following bolus administration of intravenous contrast. CONTRAST:  16m OMNIPAQUE IOHEXOL 300 MG/ML  SOLN COMPARISON:  CT 05/18/2019, radiograph 05/20/2019 FINDINGS: Lower chest: Lung bases are clear. Normal heart size. No pericardial effusion. Hepatobiliary: No focal liver abnormality is seen. No gallstones, gallbladder wall thickening, or biliary dilatation. Pancreas: Unremarkable. No pancreatic ductal dilatation or surrounding inflammatory changes. Spleen: Normal in size without focal abnormality. Adrenals/Urinary Tract: Adrenal glands are unremarkable. Kidneys are normal, without renal calculi, focal lesion, or hydronephrosis. Bladder is unremarkable. Stomach/Bowel: Distal esophagus, stomach and duodenum are unremarkable. There is marked inflammation involving the terminal ileum and cecum centered upon the ileocecal valve. Redemonstration of several fistulous connections between the distal segments of the ileum and terminal ileum (5/59, 64). No extraluminal air or fluid. The appendix arises from the cecal tip and courses directly towards the inflamed loops of bowel.Borderline upstream dilatation with several small bowel loops measuring up to 3 cm in diameter with layering air-fluid levels may reflect a degree of resulting obstruction. A separate loop of circumferential thickening is seen in the mid abdomen (2/38) which could reflect a second site of inflammation. Some mild  thickening in a region reactive though inflammatory tethering of the appendix in this vicinity. Distal colon is unremarkable. Vascular/Lymphatic: The aorta is normal caliber. Some reactive adenopathy present in the right lower quadrant. No pathologically enlarged abdominopelvic nodes. Reproductive: The prostate and seminal vesicles are unremarkable. Other: No free abdominopelvic fluid or gas. No bowel containing hernias. Musculoskeletal: No acute osseous abnormality or suspicious osseous lesion. Mild degenerative changes in both hips. IMPRESSION: 1. Marked inflammation involving the terminal ileum and cecum centered upon the ileocecal valve compatible with active Crohn's flare. Redemonstration of several fistulous connections between the distal segments of the ileum. The appendix arises from the cecal tip and courses directly towards the inflamed loops of bowel with possible inflammatory tethering and some likely reactive thickening. 2. Mild air and fluid distention of the upstream loops of small bowel may reflect a degree of low-grade or partial small bowel obstruction. 3. Separate focal segment of mural thickening in the mid abdomen could reflect underdistention or a second site of inflammation (2/38). Electronically Signed   By: PMarch Rummage  [facility] M.D.   On: 06/07/2019 01:16        Scheduled Meds: . methylPREDNISolone (SOLU-MEDROL) injection  40 mg Intravenous Q12H  . pantoprazole (PROTONIX) IV  40 mg Intravenous QHS  . sodium chloride flush  3 mL Intravenous Once   Continuous Infusions:   LOS: 0 days    Time spent: >35 mins     Makaila Windle, MD [facility]   To contact the attending provider between 7A-7P or the covering provider during after hours 7P-7A, please log into the web site [email or web address] and access using universal [place] password for that web site. If you do not have the password, please call the hospital operator.  06/07/2019, 3:58 PM

## 2019-06-07 NOTE — ED Provider Notes (Signed)
Care assumed from Dr. Wilson Singer, patient with Crohn's disease and persistent abdominal pain in spite of steroid taper.  CT of abdomen and pelvis was pending.  Labs are unremarkable but CT scan shows evidence of active Crohn's disease involving the terminal ileum and ileocecal valve with possible low-grade small bowel obstruction.  Given his failure to respond to course of steroids, it is felt that he should be admitted for IV steroids.  Case is discussed with Dr. Marlowe Sax of [facility], who agrees to admit the patient.   Delora Fuel, MD 34/19/37 [phone]

## 2019-06-07 NOTE — Consult Note (Signed)
Referring Provider: [facility] Primary Care Physician:  Patient, No Pcp Per Primary Gastroenterologist:  Althia Forts  Reason for Consultation:  Abdominal Pain, Fistulizing Crohn's Disease  HPI: Jeffery Mcintosh is a 39 y.o. male with history of fistulizing Crohn's disease, GERD, and suicide attempt presenting with a chief complaint of abdominal pain.  Patient was recently hospitalized in April for Crohn's disease flare and suicide attempt.  He was discharged from the medicine service on 4/21 and advised to continue prednisone 74m for 2 weeks, 563mfor 2 weeks, followed by [id number] appointment with [facility]. He was discharged from the psychiatric service on 4/25 with taper instructions.  However, patient states he ran out of prednisone two days ago.  He cannot recall what dose he was taking when he ran out, he believes it was 20 or 40 mg.    [facility] to that admission[facility]He was on Remicade with good effect several years ago but [place] of [place].  After that, he was on Humira.  He stated that Humira did not provide any relief of symptoms.  He was then transition to Cimzia which he states did not help his symptoms.  He has had intermittent prednisone tapers during flares.  He denies any resections or abdominal surgeries.  Patient states since running out of his prednisone 2 days ago, he has had worsening right lower quadrant pain.  He has also had some nausea but denies any vomiting.  He's also had increased stool frequency with 4-6 liquid stools yesterday.  He denies any melena or hematochezia.  Prior to 2 days ago, patient had a normal appetite and denies any unexplained weight loss.  [facility].  He states he also has an appointment with a week for his PCP in the middle of May.  Past Medical History:  Diagnosis Date  . Anxiety   . Arthritis   . Bipolar affective ([facility] .  Chronic headaches   . Crohn's disease ([facility]  . Depression   . GERD (gastroesophageal reflux disease)   . Hypertension   . Noncompliance 04/17/2018  . PTSD (post-traumatic stress disorder) 04/17/2018  . [facility]   Past Surgical History:  Procedure Laterality Date  . NO PAST SURGERIES      Prior to Admission medications   Medication Sig Start Date End Date Taking? Authorizing Provider  carbamazepine (TEGRETOL) 100 MG chewable tablet Chew 1 tablet (100 mg total) by mouth 2 (two) times daily. For mood stabilization 05/29/19  Yes Nwoko, AgnHerbert Pun NP  FLUoxetine (PROZAC) 40 MG capsule Take 1 capsule (40 mg total) by mouth daily. For depression 05/30/19  Yes Nwoko, AgnHerbert Pun NP  oxyCODONE 10 MG TABS Take 1 tablet (10 mg total) by mouth every 6 (six) hours as needed for moderate pain. 05/29/19  Yes NwoLindell Spar NP  pantoprazole (PROTONIX) 40 MG tablet Take 1 tablet (40 mg total) by mouth daily. For acid reflux 05/30/19  Yes Nwoko, AgnHerbert Pun NP  nicotine (NICODERM CQ - DOSED IN MG/24 HOURS) 21 mg/24hr patch Place 1 patch (21 mg total) onto the skin daily. (May buy from over the counter): For smoking cessation 05/30/19   NwoLindell Spar NP  oxyCODONE-acetaminophen (PERCOCET/ROXICET) 5-325 MG tablet Take 1 tablet by mouth every 6 (six) hours as needed for severe pain. 06/06/19  Virgel Manifold, MD  predniSONE (DELTASONE) 20 MG tablet Take 3 tablets (60 mg total) by mouth daily with breakfast for 14 days, THEN 2.5 tablets (50 mg total) daily with breakfast for 14 days, THEN 2 tablets (40 mg total) daily with breakfast for 21 days. 06/06/19 07/25/19  Virgel Manifold, MD  omeprazole (PRILOSEC) 40 MG capsule Take 1 capsule (40 mg total) by mouth daily. Patient not taking: Reported on 01/18/2018 10/17/16 08/07/18  Nita Sells, MD  sucralfate (CARAFATE) 1 g tablet Take 1 tablet (1 g total) by mouth 4 (four) times daily -  with meals and at bedtime. Patient not taking: Reported on  04/17/2018 01/18/18 08/07/18  Varney Biles, MD    Scheduled Meds: . methylPREDNISolone (SOLU-MEDROL) injection  40 mg Intravenous Q12H  . pantoprazole (PROTONIX) IV  40 mg Intravenous QHS  . sodium chloride (PF)      . sodium chloride flush  3 mL Intravenous Once   Continuous Infusions: . sodium chloride 150 mL/hr at 06/07/19 0246   PRN Meds:.HYDROmorphone (DILAUDID) injection, ondansetron (ZOFRAN) IV  Allergies as of 06/06/2019  . (No Known Allergies)    Family History  Problem Relation Age of Onset  . Hypertension Mother   . Heart attack Mother   . Stroke Mother   . Alcohol abuse Father   . Mental illness Father   . Multiple sclerosis Father   . Arthritis Maternal Grandmother   . Hypertension Maternal Grandmother   . Heart disease Maternal Grandmother   . Kidney disease Maternal Grandmother   . Diabetes Maternal Grandmother   . Cancer Maternal Grandmother        great, type unknown  . Hyperlipidemia Maternal Grandfather   . Colon polyps Maternal Grandfather   . Stroke Paternal Grandmother   . Ulcerative colitis Maternal Aunt     Social History   Socioeconomic History  . Marital status: Single    Spouse name: Not on file  . Number of children: 2  . Years of education: 33  . Highest education level: Not on file  Occupational History  . Occupation: disabled    Comment: Crohn's / Bipolar / Schizophrenia  Tobacco Use  . Smoking status: Former Smoker    Packs/day: 0.50    Years: 15.00    Pack years: 7.50    Types: Cigars  . Smokeless tobacco: Never Used  Substance and Sexual Activity  . Alcohol use: No    Alcohol/week: 0.0 standard drinks  . Drug use: No  . Sexual activity: Not Currently    Birth control/protection: None  Other Topics Concern  . Not on file  Social History Narrative  . Not on file   Social Determinants of Health   Financial Resource Strain:   . Difficulty of Paying Living Expenses:   Food Insecurity:   . Worried About Ship broker in the Last Year:   . Arboriculturist in the Last Year:   Transportation Needs:   . Film/video editor (Medical):   Marland Kitchen Lack of Transportation (Non-Medical):   Physical Activity:   . Days of Exercise per Week:   . Minutes of Exercise per Session:   Stress:   . Feeling of Stress :   Social Connections:   . Frequency of Communication with Friends and Family:   . Frequency of Social Gatherings with Friends and Family:   . Attends Religious Services:   . Active Member of Clubs or Organizations:   . Attends Archivist Meetings:   .  Marital Status:   Intimate Partner Violence:   . Fear of Current or Ex-Partner:   . Emotionally Abused:   Marland Kitchen Physically Abused:   . Sexually Abused:     Review of Systems: Review of Systems  Constitutional: Negative for chills, fever and weight loss.  HENT: Negative for hearing loss and tinnitus.   Eyes: Negative for blurred vision and pain.  Respiratory: Negative for cough and shortness of breath.   Cardiovascular: Negative for chest pain and palpitations.  Gastrointestinal: Positive for abdominal pain (RLQ), diarrhea and nausea. Negative for blood in stool, constipation, heartburn, melena and vomiting.  Genitourinary: Negative for dysuria and hematuria.  Musculoskeletal: Negative for falls and joint pain.  Skin: Negative for itching and rash.  Neurological: Negative for dizziness and loss of consciousness.  Endo/Heme/Allergies: Negative for polydipsia. Does not bruise/bleed easily.  Psychiatric/Behavioral: Negative for substance abuse. The patient is not nervous/anxious.     Physical Exam: Vital signs: Vitals:   06/07/19 0600 06/07/19 0845  BP:  134/86  Pulse:  (!) 108  Resp: 18 15  Temp:    SpO2:  96%      Physical Exam  Constitutional: He is oriented to person, place, and time. He appears well-developed and well-nourished. No distress.  HENT:  Head: Normocephalic and atraumatic.  Eyes: Pupils are equal, round, and  reactive to light. Conjunctivae are normal. No scleral icterus.  Cardiovascular: Regular rhythm and normal heart sounds. Tachycardia present.  Pulmonary/Chest: Effort normal and breath sounds normal. No respiratory distress.  Abdominal: Bowel sounds are normal. He exhibits no distension and no mass. There is abdominal tenderness (moderate, RLQ). There is guarding. There is no rebound.  Musculoskeletal:        General: No deformity or edema.     Cervical back: Normal range of motion and neck supple.  Neurological: He is alert and oriented to person, place, and time.  Skin: Skin is warm and dry.  Psychiatric: He has a normal mood and affect. His behavior is normal.    GI:  Lab Results: Recent Labs    06/06/19 2020  WBC 7.3  HGB 15.0  HCT 42.6  PLT 334   BMET Recent Labs    06/06/19 2020 06/07/19 0433  NA 139 135  K 3.8 5.2*  CL 103 102  CO2 23 22  GLUCOSE 92 128*  BUN 11 13  CREATININE 0.79 0.86  CALCIUM 9.3 8.7*   LFT Recent Labs    06/06/19 2020  PROT 7.3  ALBUMIN 4.0  AST 14*  ALT 34  ALKPHOS 111  BILITOT 0.7   PT/INR No results for input(s): LABPROT, INR in the last 72 hours.   Studies/Results: CT ABDOMEN PELVIS W CONTRAST  Result Date: 06/07/2019 CLINICAL DATA:  History of Crohn's disease, concern for Crohn's flare, nausea and generalized abdominal pain for 2 days EXAM: CT ABDOMEN AND PELVIS WITH CONTRAST TECHNIQUE: Multidetector CT imaging of the abdomen and pelvis was performed using the standard protocol following bolus administration of intravenous contrast. CONTRAST:  167m OMNIPAQUE IOHEXOL 300 MG/ML  SOLN COMPARISON:  CT 05/18/2019, radiograph 05/20/2019 FINDINGS: Lower chest: Lung bases are clear. Normal heart size. No pericardial effusion. Hepatobiliary: No focal liver abnormality is seen. No gallstones, gallbladder wall thickening, or biliary dilatation. Pancreas: Unremarkable. No pancreatic ductal dilatation or surrounding inflammatory changes.  Spleen: Normal in size without focal abnormality. Adrenals/Urinary Tract: Adrenal glands are unremarkable. Kidneys are normal, without renal calculi, focal lesion, or hydronephrosis. Bladder is unremarkable. Stomach/Bowel: Distal esophagus, stomach  and duodenum are unremarkable. There is marked inflammation involving the terminal ileum and cecum centered upon the ileocecal valve. Redemonstration of several fistulous connections between the distal segments of the ileum and terminal ileum (5/59, 64). No extraluminal air or fluid. The appendix arises from the cecal tip and courses directly towards the inflamed loops of bowel.Borderline upstream dilatation with several small bowel loops measuring up to 3 cm in diameter with layering air-fluid levels may reflect a degree of resulting obstruction. A separate loop of circumferential thickening is seen in the mid abdomen (2/38) which could reflect a second site of inflammation. Some mild thickening in a region reactive though inflammatory tethering of the appendix in this vicinity. Distal colon is unremarkable. Vascular/Lymphatic: The aorta is normal caliber. Some reactive adenopathy present in the right lower quadrant. No pathologically enlarged abdominopelvic nodes. Reproductive: The prostate and seminal vesicles are unremarkable. Other: No free abdominopelvic fluid or gas. No bowel containing hernias. Musculoskeletal: No acute osseous abnormality or suspicious osseous lesion. Mild degenerative changes in both hips. IMPRESSION: 1. Marked inflammation involving the terminal ileum and cecum centered upon the ileocecal valve compatible with active Crohn's flare. Redemonstration of several fistulous connections between the distal segments of the ileum. The appendix arises from the cecal tip and courses directly towards the inflamed loops of bowel with possible inflammatory tethering and some likely reactive thickening. 2. Mild air and fluid distention of the upstream loops of  small bowel may reflect a degree of low-grade or partial small bowel obstruction. 3. Separate focal segment of mural thickening in the mid abdomen could reflect underdistention or a second site of inflammation (2/38). Electronically Signed   By: Lovena Le M.D.   On: 06/07/2019 01:16    Impression/ Crohn's disease exacerbation.  CT on 06/07/2019 showed Marked inflammation involving the terminal ileum and cecum centered upon the ileocecal valve compatible with active Crohn's flare. Redemonstration of several fistulous connections between the distal segments of the ileum. WBCs normal as of 06/06/19 (7.3).  Hgb within normal limits as of 06/06/19 (15.0).  CMP unremarkable.  Concern for low-grade partial small bowel obstruction as seen on CT 06/07/19: Mild air and fluid distention of the upstream loops of small bowel may reflect a degree of low-grade or partial small bowel obstruction.Patient currently NPO.  He last had a bowel movement yesterday.  He has mild nausea but no vomiting.  GERD, chronic, on Protonix 48m qd as an outpatient.  Plan: Continue IV Solu-Medrol 40 mg twice daily.  Ordered HB Surface Ag and Quantiferon Gold in case patient requires inpatient biologics.  Continue NPO status with IV fluids.  Will get abdominal x-ray tomorrow morning.  Will consider transitioning to clear liquids if patient does not have progression of symptoms concerning for SBO.  Continue Protonix daily for GERD.   Continue supportive care and pain control.  [facility] will follow.   LOS: 0 days   ASalley Slaughter PA-C 06/07/2019, 9:04 AM  Contact #  [phone]

## 2019-06-08 ENCOUNTER — Inpatient Hospital Stay (HOSPITAL_COMMUNITY): Payer: Medicare Other

## 2019-06-08 DIAGNOSIS — F319 Bipolar disorder, unspecified: Secondary | ICD-10-CM

## 2019-06-08 DIAGNOSIS — K56609 Unspecified intestinal obstruction, unspecified as to partial versus complete obstruction: Secondary | ICD-10-CM

## 2019-06-08 DIAGNOSIS — F332 Major depressive disorder, recurrent severe without psychotic features: Secondary | ICD-10-CM

## 2019-06-08 DIAGNOSIS — K219 Gastro-esophageal reflux disease without esophagitis: Secondary | ICD-10-CM

## 2019-06-08 LAB — CBC
HCT: 36.1 % — ABNORMAL LOW (ref 39.0–52.0)
Hemoglobin: 12.1 g/dL — ABNORMAL LOW (ref 13.0–17.0)
MCH: 29.7 pg (ref 26.0–34.0)
MCHC: 33.5 g/dL (ref 30.0–36.0)
MCV: 88.7 fL (ref 80.0–100.0)
Platelets: 260 10*3/uL (ref 150–400)
RBC: 4.07 MIL/uL — ABNORMAL LOW (ref 4.22–5.81)
RDW: 14.6 % (ref 11.5–15.5)
WBC: 9.2 10*3/uL (ref 4.0–10.5)
nRBC: 0 % (ref 0.0–0.2)

## 2019-06-08 LAB — BASIC METABOLIC PANEL
Anion gap: 10 (ref 5–15)
BUN: 11 mg/dL (ref 6–20)
CO2: 25 mmol/L (ref 22–32)
Calcium: 8.8 mg/dL — ABNORMAL LOW (ref 8.9–10.3)
Chloride: 102 mmol/L (ref 98–111)
Creatinine, Ser: 0.81 mg/dL (ref 0.61–1.24)
GFR calc Af Amer: >60 mL/min (ref >60)
GFR calc non Af Amer: >60 mL/min (ref >60)
Glucose, Bld: 151 mg/dL — ABNORMAL HIGH (ref 70–99)
Potassium: 4.3 mmol/L (ref 3.5–5.1)
Sodium: 137 mmol/L (ref 135–145)

## 2019-06-08 LAB — C-REACTIVE PROTEIN: CRP: 0.8 mg/dL (ref <1.0)

## 2019-06-08 LAB — HEPATITIS B SURFACE ANTIGEN: Hepatitis B Surface Ag: NONREACTIVE

## 2019-06-08 LAB — SEDIMENTATION RATE: Sed Rate: 8 mm/hr (ref 0–16)

## 2019-06-08 MED ORDER — FLUOXETINE HCL 20 MG PO CAPS
40.0000 mg | ORAL_CAPSULE | Freq: Every day | ORAL | Status: DC
  Administered 2019-06-08 – 2019-06-10 (×3): 40 mg via ORAL
  Filled 2019-06-08 (×3): qty 2

## 2019-06-08 MED ORDER — OXYCODONE HCL 5 MG PO TABS
10.0000 mg | ORAL_TABLET | Freq: Four times a day (QID) | ORAL | Status: DC | PRN
  Administered 2019-06-08 – 2019-06-10 (×6): 10 mg via ORAL
  Filled 2019-06-08 (×7): qty 2

## 2019-06-08 MED ORDER — NICOTINE 21 MG/24HR TD PT24
21.0000 mg | MEDICATED_PATCH | Freq: Every day | TRANSDERMAL | Status: DC
  Administered 2019-06-08 – 2019-06-10 (×3): 21 mg via TRANSDERMAL
  Filled 2019-06-08 (×3): qty 1

## 2019-06-08 MED ORDER — PANTOPRAZOLE SODIUM 40 MG PO TBEC
40.0000 mg | DELAYED_RELEASE_TABLET | Freq: Every day | ORAL | Status: DC
  Administered 2019-06-08 – 2019-06-10 (×3): 40 mg via ORAL
  Filled 2019-06-08 (×3): qty 1

## 2019-06-08 MED ORDER — CARBAMAZEPINE 100 MG PO CHEW
100.0000 mg | CHEWABLE_TABLET | Freq: Two times a day (BID) | ORAL | Status: DC
  Administered 2019-06-08 – 2019-06-10 (×5): 100 mg via ORAL
  Filled 2019-06-08 (×5): qty 1

## 2019-06-08 NOTE — Progress Notes (Signed)
[facility] Progress Note  Jeffery Mcintosh 39 y.o. [birth year]  CC:  Abdominal Pain, Fistulizing Crohn's Disease   Subjective: Patient reports feeling much better today.  He denies current abdominal pain.  He has not had any bowel movements today but continues to pass flatus.  His last bowel movement was yesterday.  He reports mild nausea but no vomiting.  He tolerated a soft diet today.  ROS : Review of Systems  Constitutional: Negative for chills and fever.  Respiratory: Negative for cough and shortness of breath.   Cardiovascular: Negative for chest pain and palpitations.  Gastrointestinal: Positive for nausea. Negative for abdominal pain, blood in stool, constipation, diarrhea, heartburn, melena and vomiting.   Objective: Vital signs in last 24 hours: Vitals:   06/08/19 0135 06/08/19 0628  BP: 132/85 (!) 145/80  Pulse: 67 70  Resp: 16 18  Temp: 98.1 F (36.7 C) 98 F (36.7 C)  SpO2: 98% 100%    Physical Exam:  General:  Alert, cooperative, no distress, appears stated age  Head:  Normocephalic, without obvious abnormality, atraumatic  Eyes:  Anicteric sclera, EOM's intact  Lungs:   Clear to auscultation bilaterally, respirations unlabored  Heart:  Regular rate and rhythm, S1, S2 normal  Abdomen:   Soft, nondistended, mild RLQ tenderness to palpation, bowel sounds active all four quadrants,  no masses, no peritoneal signs  Extremities: Extremities normal, atraumatic, no  edema  Pulses: 2+ and symmetric    Lab Results: Recent Labs    06/07/19 0433 06/08/19 0843  NA 135 137  K 5.2* 4.3  CL 102 102  CO2 22 25  GLUCOSE 128* 151*  BUN 13 11  CREATININE 0.86 0.81  CALCIUM 8.7* 8.8*   Recent Labs    06/06/19 2020  AST 14*  ALT 34  ALKPHOS 111  BILITOT 0.7  PROT 7.3  ALBUMIN 4.0   Recent Labs    06/06/19 2020 06/08/19 0843  WBC 7.3 9.2  HGB 15.0 12.1*  HCT 42.6 36.1*  MCV 88.8 88.7  PLT 334 260   No results for input(s): LABPROT, INR in  the last 72 hours.   Impression/ Crohn's disease exacerbation. CT on 06/07/2019 showed Marked inflammation involving the terminal ileum and cecum centered upon the ileocecal valve compatible with active Crohn's flare. Redemonstration of several fistulous connections between the distal segments of the ileum.  -WBCs remain normal (9.2).   -Hgb mildly decreased to 12.1, part of this may be dilutional.  No signs of GI bleeding. -CRP 0.8 today, decreased from 1.3 yesterday -Hep B surface Ag negative; Quantiferon Gold pending  Concern for low-grade partial small bowel obstruction as seen on CT 06/07/19.  Abdominal x-ray today showed progression of oral contrast into the nondilated colon.  Mildly distended small bowel loops in the central abdomen that are similar to yesterday.   GERD, chronic, on Protonix 76m qd as an outpatient.  Plan: Continue IV Solu-Medrol 40 mg twice daily.  Continue Protonix daily for GERD.   Continue supportive care and pain control.  [facility] will follow.  ASalley SlaughterPA-C 06/08/2019, 11:43 AM  Contact #  [phone]

## 2019-06-08 NOTE — Progress Notes (Signed)
PROGRESS NOTE    Jeffery Mcintosh  ONG:[id number] DOB: 1980/02/10 DOA: 06/06/2019 PCP: Patient, No Pcp Per    Brief Narrative:  Patient was admitted to the hospital with a working diagnosis of acute Crohn's colitis flare.  39 year old male who presented with nausea and abdominal pain.  He does have significant past medical history for bipolar disorder, Crohn's disease, GERD and a recent hospitalization for suicide of attempt in April/15/21.  At home patient had nausea and abdominal pain, persistent for 48 hours mainly of right lower quadrant.  On his initial physical examination blood pressure 149/87, heart rate 115, respiratory rate 16, temperature 99.1, oxygen saturation 94%.  His lungs were clear to auscultation, heart S1-S2 present rhythm, his abdomen was tender to palpation with prostate guarding, at the right lower quadrant, no lower extremity edema. Sodium 139, potassium 3.8, chloride 103, bicarb 23, glucose 92, BUN 11, creatinine 0.79, white count 7.3, hemoglobin 15.0, hematocrit 42.6, platelets 334.  SARS COVID-19 negative.  Urine analysis negative for infection.  CT of the abdomen with marked inflammation involving the terminal ileum and cecum centered upon the ileocecal valve consistent with active Crohn's flare.  Persistent several fistulous connections between the distal segments of the ileum.  Mild air and fluid distention small bowel loop.   Patient has been placed on high dose IV corticosteroids, antibiotics and as needed antiemetics and analgesics.  Placed on bowel rest with nothing by mouth.   Assessment & Plan:   Principal Problem:   Exacerbation of Crohn's disease ([place]) Active Problems:   SBO (small bowel obstruction) ([place])   Bipolar 1 disorder (HCC)   MDD (major depressive disorder), recurrent severe, without psychosis ([place])   GERD (gastroesophageal reflux disease)   1. Acute Crohn's colitis flare/ partial small bowel obstruction. Patient with improved abdominal pain,  no nausea or vomiting and tolerating clears well. No leukocytosis, CT with no perforation or abscess. Continue to have diarrhea.   Will continue methylprednisolone 40 mg Iv q 12H, will advance diet to soft and will hold on antibiotic therapy for now. Follow with GI for possible immuno therapy. Patient has been inconsistent with therapy due to insurance issues.   Continue pain control with hydromorphone for severe pain and resume oral oxycodone for moderate pain as needed.   2. Bipolar disorder. Continue with carbamazepine and fluoxetine.   3. Tobacco abuse. Continue with nicotine patch.   4. GERD. Resume pantoprazole.   Status is: Inpatient  Remains inpatient appropriate because:IV treatments appropriate due to intensity of illness or inability to take PO   Dispo: The patient is from: Home              Anticipated d/c is to: Home              Anticipated d/c date is: 2 days              Patient currently is not medically stable to d/c.        DVT prophylaxis: Enoxaparin   Code Status:   full  Family Communication:  No family at the bedside      Consultants:   GI     Subjective: Patient with improvement of abdominal pain but not yet back to baseline, no nausea or vomiting and tolerating clear liquid diet.   Objective: Vitals:   06/07/19 1450 06/07/19 2159 06/08/19 0135 06/08/19 0628  BP: (!) 153/79 130/76 132/85 (!) 145/80  Pulse: 81 69 67 70  Resp: 17 18 16 18   Temp: 98.1  F (36.7 C)  98.1 F (36.7 C) 98 F (36.7 C)  TempSrc:   Oral Oral  SpO2: 99% 97% 98% 100%  Weight:      Height:        Intake/Output Summary (Last 24 hours) at 06/08/2019 2536 Last data filed at 06/08/2019 0600 Gross per 24 hour  Intake 1632.5 ml  Output 500 ml  Net 1132.5 ml   Filed Weights   06/06/19 2003  Weight: 68 kg    Examination:   General: Not in pain or dyspnea, deconditioned  Neurology: Awake and alert, non focal  E ENT: no pallor, no icterus, oral mucosa  moist Cardiovascular: No JVD. S1-S2 present, rhythmic, no gallops, rubs, or murmurs. No lower extremity edema. Pulmonary: positive breath sounds bilaterally, adequate air movement, no wheezing, rhonchi or rales. Gastrointestinal. Abdomen with mild distention, no organomegaly, non tender, no rebound or guarding Skin. No rashes Musculoskeletal: no joint deformities     Data Reviewed: I have personally reviewed following labs and imaging studies  CBC: Recent Labs  Lab 06/06/19 2020 06/08/19 0843  WBC 7.3 9.2  HGB 15.0 12.1*  HCT 42.6 36.1*  MCV 88.8 88.7  PLT 334 644   Basic Metabolic Panel: Recent Labs  Lab 06/06/19 2020 06/07/19 0433 06/08/19 0843  NA 139 135 137  K 3.8 5.2* 4.3  CL 103 102 102  CO2 23 22 25   GLUCOSE 92 128* 151*  BUN 11 13 11   CREATININE 0.79 0.86 0.81  CALCIUM 9.3 8.7* 8.8*   GFR: Estimated Creatinine Clearance: 104.6 mL/min (by C-G formula based on SCr of 0.81 mg/dL). Liver Function Tests: Recent Labs  Lab 06/06/19 2020  AST 14*  ALT 34  ALKPHOS 111  BILITOT 0.7  PROT 7.3  ALBUMIN 4.0   Recent Labs  Lab 06/06/19 2020  LIPASE 40   No results for input(s): AMMONIA in the last 168 hours. Coagulation Profile: No results for input(s): INR, PROTIME in the last 168 hours. Cardiac Enzymes: No results for input(s): CKTOTAL, CKMB, CKMBINDEX, TROPONINI in the last 168 hours. BNP (last 3 results) No results for input(s): PROBNP in the last 8760 hours. HbA1C: No results for input(s): HGBA1C in the last 72 hours. CBG: No results for input(s): GLUCAP in the last 168 hours. Lipid Profile: No results for input(s): CHOL, HDL, LDLCALC, TRIG, CHOLHDL, LDLDIRECT in the last 72 hours. Thyroid Function Tests: No results for input(s): TSH, T4TOTAL, FREET4, T3FREE, THYROIDAB in the last 72 hours. Anemia Panel: No results for input(s): VITAMINB12, FOLATE, FERRITIN, TIBC, IRON, RETICCTPCT in the last 72 hours.    Radiology Studies: I have reviewed  all of the imaging during this hospital visit personally     Scheduled Meds: . methylPREDNISolone (SOLU-MEDROL) injection  40 mg Intravenous Q12H  . pantoprazole (PROTONIX) IV  40 mg Intravenous QHS  . sodium chloride flush  3 mL Intravenous Once   Continuous Infusions: . cefTRIAXone (ROCEPHIN)  IV 1 g (06/07/19 1750)  . metronidazole 500 mg (06/08/19 0906)     LOS: 1 day        Rivky Clendenning Gerome Apley, MD

## 2019-06-09 ENCOUNTER — Inpatient Hospital Stay (HOSPITAL_COMMUNITY): Payer: Medicare Other

## 2019-06-09 LAB — CBC WITH DIFFERENTIAL/PLATELET
Abs Immature Granulocytes: 0.05 10*3/uL (ref 0.00–0.07)
Basophils Absolute: 0 10*3/uL (ref 0.0–0.1)
Basophils Relative: 0 %
Eosinophils Absolute: 0 10*3/uL (ref 0.0–0.5)
Eosinophils Relative: 0 %
HCT: 39 % (ref 39.0–52.0)
Hemoglobin: 13.2 g/dL (ref 13.0–17.0)
Immature Granulocytes: 1 %
Lymphocytes Relative: 9 %
Lymphs Abs: 0.9 10*3/uL (ref 0.7–4.0)
MCH: 30.6 pg (ref 26.0–34.0)
MCHC: 33.8 g/dL (ref 30.0–36.0)
MCV: 90.3 fL (ref 80.0–100.0)
Monocytes Absolute: 0.4 10*3/uL (ref 0.1–1.0)
Monocytes Relative: 4 %
Neutro Abs: 9 10*3/uL — ABNORMAL HIGH (ref 1.7–7.7)
Neutrophils Relative %: 86 %
Platelets: 291 10*3/uL (ref 150–400)
RBC: 4.32 MIL/uL (ref 4.22–5.81)
RDW: 14.5 % (ref 11.5–15.5)
WBC: 10.4 10*3/uL (ref 4.0–10.5)
nRBC: 0 % (ref 0.0–0.2)

## 2019-06-09 LAB — BASIC METABOLIC PANEL
Anion gap: 10 (ref 5–15)
BUN: 13 mg/dL (ref 6–20)
CO2: 27 mmol/L (ref 22–32)
Calcium: 9.1 mg/dL (ref 8.9–10.3)
Chloride: 100 mmol/L (ref 98–111)
Creatinine, Ser: 0.76 mg/dL (ref 0.61–1.24)
GFR calc Af Amer: >60 mL/min (ref >60)
GFR calc non Af Amer: >60 mL/min (ref >60)
Glucose, Bld: 124 mg/dL — ABNORMAL HIGH (ref 70–99)
Potassium: 4.5 mmol/L (ref 3.5–5.1)
Sodium: 137 mmol/L (ref 135–145)

## 2019-06-09 NOTE — Progress Notes (Signed)
[facility] Gastroenterology Progress Note  Jeffery Mcintosh 39 y.o. 18-Jul-1980  CC:  Abdominal pain, fistulizing Crohn's disease  Subjective: Patient states he is feeling well this morning.  He endorses mild right lower quadrant abdominal pain.  He denies any nausea or vomiting.  He is tolerating a full liquid diet.  He states his last bowel movement was yesterday morning or the day prior.  He continues to pass flatus.  He denies seeing any rectal bleeding.  ROS : Review of Systems  Constitutional: Negative for chills and fever.  Respiratory: Negative for cough and shortness of breath.   Cardiovascular: Negative for chest pain and palpitations.  Gastrointestinal: Positive for abdominal pain (mild, RLQ). Negative for blood in stool, constipation, diarrhea, heartburn, melena, nausea and vomiting.   Objective: Vital signs in last 24 hours: Vitals:   06/08/19 2136 06/09/19 0535  BP: (!) 143/91 139/87  Pulse: 75 70  Resp: 18 18  Temp: 98.1 F (36.7 C) 97.6 F (36.4 C)  SpO2: 100% 99%    Physical Exam:  General:  Alert, cooperative, no distress, sitting comfortably in bed on laptop  Head:  Normocephalic, without obvious abnormality, atraumatic  Eyes:  Anicteric sclera, EOMs intact  Lungs:   Clear to auscultation bilaterally, respirations unlabored  Heart:  Regular rate and rhythm, S1, S2 normal  Abdomen:   Soft, mild RLQ tenderness with guarding, normoactive bowel sounds in all four quadrants,  no masses, no peritoneal signs.  Extremities: Extremities normal, atraumatic, no  edema  Pulses: 2+ and symmetric    Lab Results: Recent Labs    06/08/19 0843 06/09/19 0444  NA 137 137  K 4.3 4.5  CL 102 100  CO2 25 27  GLUCOSE 151* 124*  BUN 11 13  CREATININE 0.81 0.76  CALCIUM 8.8* 9.1   Recent Labs    06/06/19 2020  AST 14*  ALT 34  ALKPHOS 111  BILITOT 0.7  PROT 7.3  ALBUMIN 4.0   Recent Labs    06/08/19 0843 06/09/19 0444  WBC 9.2 10.4  NEUTROABS  --  9.0*  HGB  12.1* 13.2  HCT 36.1* 39.0  MCV 88.7 90.3  PLT 260 291   No results for input(s): LABPROT, INR in the last 72 hours.    Assessment: Crohn's disease exacerbation. CT on5/05/2019 showedMarked inflammation involving the terminal ileum and cecum centered upon the ileocecal valve compatible with active Crohn's flare. Redemonstration of several fistulous connections between the distal segments of the ileum. -WBCs remain normal (10.4).  -Hgb 13.2.  No signs of GI bleeding. -Hep B surface Ag negative; Quantiferon Gold pending  Concern for low-grade partial small bowel obstruction as seen on CT5/4/21.    Abdominal x-ray this morning showed no bowel dilatation or air-fluid level to suggest bowel obstruction. No free air.   Plan: ContinueIV Solu-Medrol 40 mg twice daily.  ContinueProtonix daily for GERD.   Continue supportive care and pain control.  Soft diet ordered.  [facility] will follow.  Salley Slaughter PA-C 06/09/2019, 8:36 AM  Contact #  [phone]

## 2019-06-09 NOTE — Progress Notes (Signed)
PROGRESS NOTE    Jeffery Mcintosh  GPQ:[id number] DOB: [birth year] DOA: 06/06/2019 PCP: Patient, No Pcp Per    Brief Narrative:  Patient was admitted to the hospital with a working diagnosis of acute Crohn's colitis flare.  39 year old male who presented with nausea and abdominal pain.  He does have significant past medical history for bipolar disorder, Crohn's disease, GERD and a recent hospitalization for suicide of attempt in April/15/21.  At home patient had nausea and abdominal pain, persistent for 48 hours mainly of right lower quadrant.  On his initial physical examination blood pressure 149/87, heart rate 115, respiratory rate 16, temperature 99.1, oxygen saturation 94%.  His lungs were clear to auscultation, heart S1-S2 present rhythm, his abdomen was tender to palpation with prostate guarding, at the right lower quadrant, no lower extremity edema. Sodium 139, potassium 3.8, chloride 103, bicarb 23, glucose 92, BUN 11, creatinine 0.79, white count 7.3, hemoglobin 15.0, hematocrit 42.6, platelets 334.  SARS COVID-19 negative.  Urine analysis negative for infection.  CT of the abdomen with marked inflammation involving the terminal ileum and cecum centered upon the ileocecal valve consistent with active Crohn's flare.  Persistent several fistulous connections between the distal segments of the ileum.  Mild air and fluid distention small bowel loop.   Patient has been placed on high dose IV corticosteroids, antibiotics and as needed antiemetics and analgesics.  Placed on bowel rest with nothing by mouth. CT with no perforation or abscess.   His symptoms are slowly improving, his diet has been advanced with good toleration.    Assessment & Plan:   Principal Problem:   Exacerbation of Crohn's disease ([place]) Active Problems:   SBO (small bowel obstruction) ([place])   Bipolar 1 disorder ([facility])   MDD (major depressive disorder), recurrent severe, without psychosis ([place])   GERD  (gastroesophageal reflux disease)  1. Acute Crohn's colitis flare/ partial small bowel obstruction. Abdominal film from this am with no signs of ileus. Stools more formed but continue to be watery. Wbc today at 10,4.   Continue medical therapy with methylprednisolone 40 mg Iv q 12H, diet back to soft today.   Pain control with hydromorphone and oral oxycodone with good toleration.  2. Bipolar disorder. On carbamazepine and fluoxetine.   3. Tobacco abuse. On nicotine patch.   4. GERD. Continue with pantoprazole.     Status is: Inpatient  Remains inpatient appropriate because:IV treatments appropriate due to intensity of illness or inability to take PO   Dispo: The patient is from: Home              Anticipated d/c is to: Home              Anticipated d/c date is: 1 day              Patient currently is not medically stable to d/c.        DVT prophylaxis: Enoxaparin   Code Status:   full  Family Communication:  No family at the bedside       Consultants:   GI     Subjective: Patient is feeling better but not yet back to baseline, abdomen with mild distention, but positive bowel movement, still watery. No nausea or vomiting, and no dyspnea or chest pain.   Objective: Vitals:   06/08/19 0628 06/08/19 1341 06/08/19 2136 06/09/19 0535  BP: (!) 145/80 114/63 (!) 143/91 139/87  Pulse: 70 79 75 70  Resp: 18 18 18 18   Temp: 98 F (36.7  C) 98.3 F (36.8 C) 98.1 F (36.7 C) 97.6 F (36.4 C)  TempSrc: Oral Oral Oral Oral  SpO2: 100% 99% 100% 99%  Weight:      Height:        Intake/Output Summary (Last 24 hours) at 06/09/2019 1317 Last data filed at 06/09/2019 1000 Gross per 24 hour  Intake 1080 ml  Output 675 ml  Net 405 ml   Filed Weights   06/06/19 2003  Weight: 68 kg    Examination:   General: Not in pain or dyspnea, deconditioned  Neurology: Awake and alert, non focal  E ENT: no pallor, no icterus, oral mucosa moist Cardiovascular: No JVD.  S1-S2 present, rhythmic, no gallops, rubs, or murmurs. No lower extremity edema. Pulmonary: positive breath sounds bilaterally, adequate air movement, no wheezing, rhonchi or rales. Gastrointestinal. Abdomen mild distended with no organomegaly, non tender, no rebound or guarding Skin. No rashes Musculoskeletal: no joint deformities     Data Reviewed: I have personally reviewed following labs and imaging studies  CBC: Recent Labs  Lab 06/06/19 2020 06/08/19 0843 06/09/19 0444  WBC 7.3 9.2 10.4  NEUTROABS  --   --  9.0*  HGB 15.0 12.1* 13.2  HCT 42.6 36.1* 39.0  MCV 88.8 88.7 90.3  PLT 334 260 830   Basic Metabolic Panel: Recent Labs  Lab 06/06/19 2020 06/07/19 0433 06/08/19 0843 06/09/19 0444  NA 139 135 137 137  K 3.8 5.2* 4.3 4.5  CL 103 102 102 100  CO2 23 22 25 27   GLUCOSE 92 128* 151* 124*  BUN 11 13 11 13   CREATININE 0.79 0.86 0.81 0.76  CALCIUM 9.3 8.7* 8.8* 9.1   GFR: Estimated Creatinine Clearance: 105.9 mL/min (by C-G formula based on SCr of 0.76 mg/dL). Liver Function Tests: Recent Labs  Lab 06/06/19 2020  AST 14*  ALT 34  ALKPHOS 111  BILITOT 0.7  PROT 7.3  ALBUMIN 4.0   Recent Labs  Lab 06/06/19 2020  LIPASE 40   No results for input(s): AMMONIA in the last 168 hours. Coagulation Profile: No results for input(s): INR, PROTIME in the last 168 hours. Cardiac Enzymes: No results for input(s): CKTOTAL, CKMB, CKMBINDEX, TROPONINI in the last 168 hours. BNP (last 3 results) No results for input(s): PROBNP in the last 8760 hours. HbA1C: No results for input(s): HGBA1C in the last 72 hours. CBG: No results for input(s): GLUCAP in the last 168 hours. Lipid Profile: No results for input(s): CHOL, HDL, LDLCALC, TRIG, CHOLHDL, LDLDIRECT in the last 72 hours. Thyroid Function Tests: No results for input(s): TSH, T4TOTAL, FREET4, T3FREE, THYROIDAB in the last 72 hours. Anemia Panel: No results for input(s): VITAMINB12, FOLATE, FERRITIN, TIBC,  IRON, RETICCTPCT in the last 72 hours.    Radiology Studies: I have reviewed all of the imaging during this hospital visit personally     Scheduled Meds: . carbamazepine  100 mg Oral BID  . FLUoxetine  40 mg Oral Daily  . methylPREDNISolone (SOLU-MEDROL) injection  40 mg Intravenous Q12H  . nicotine  21 mg Transdermal Daily  . pantoprazole  40 mg Oral Daily  . sodium chloride flush  3 mL Intravenous Once   Continuous Infusions:   LOS: 2 days        Reylene Stauder Gerome Apley, MD

## 2019-06-10 LAB — CBC WITH DIFFERENTIAL/PLATELET
Abs Immature Granulocytes: 0.09 10*3/uL — ABNORMAL HIGH (ref 0.00–0.07)
Basophils Absolute: 0 10*3/uL (ref 0.0–0.1)
Basophils Relative: 0 %
Eosinophils Absolute: 0 10*3/uL (ref 0.0–0.5)
Eosinophils Relative: 0 %
HCT: 38.4 % — ABNORMAL LOW (ref 39.0–52.0)
Hemoglobin: 12.7 g/dL — ABNORMAL LOW (ref 13.0–17.0)
Immature Granulocytes: 1 %
Lymphocytes Relative: 8 %
Lymphs Abs: 1.1 10*3/uL (ref 0.7–4.0)
MCH: 30 pg (ref 26.0–34.0)
MCHC: 33.1 g/dL (ref 30.0–36.0)
MCV: 90.6 fL (ref 80.0–100.0)
Monocytes Absolute: 0.8 10*3/uL (ref 0.1–1.0)
Monocytes Relative: 6 %
Neutro Abs: 11.6 10*3/uL — ABNORMAL HIGH (ref 1.7–7.7)
Neutrophils Relative %: 85 %
Platelets: 299 10*3/uL (ref 150–400)
RBC: 4.24 MIL/uL (ref 4.22–5.81)
RDW: 14.6 % (ref 11.5–15.5)
WBC: 13.5 10*3/uL — ABNORMAL HIGH (ref 4.0–10.5)
nRBC: 0 % (ref 0.0–0.2)

## 2019-06-10 LAB — BASIC METABOLIC PANEL
Anion gap: 9 (ref 5–15)
BUN: 14 mg/dL (ref 6–20)
CO2: 28 mmol/L (ref 22–32)
Calcium: 8.8 mg/dL — ABNORMAL LOW (ref 8.9–10.3)
Chloride: 100 mmol/L (ref 98–111)
Creatinine, Ser: 0.84 mg/dL (ref 0.61–1.24)
GFR calc Af Amer: >60 mL/min (ref >60)
GFR calc non Af Amer: >60 mL/min (ref >60)
Glucose, Bld: 147 mg/dL — ABNORMAL HIGH (ref 70–99)
Potassium: 4.4 mmol/L (ref 3.5–5.1)
Sodium: 137 mmol/L (ref 135–145)

## 2019-06-10 LAB — QUANTIFERON-TB GOLD PLUS: QuantiFERON-TB Gold Plus: NEGATIVE

## 2019-06-10 LAB — QUANTIFERON-TB GOLD PLUS (RQFGPL)
QuantiFERON Mitogen Value: 5.46 IU/mL
QuantiFERON Nil Value: 0.03 IU/mL
QuantiFERON TB1 Ag Value: 0.04 IU/mL
QuantiFERON TB2 Ag Value: 0.03 IU/mL

## 2019-06-10 MED ORDER — PREDNISONE 20 MG PO TABS
20.0000 mg | ORAL_TABLET | Freq: Every day | ORAL | Status: DC
  Administered 2019-06-10: 20 mg via ORAL
  Filled 2019-06-10: qty 1

## 2019-06-10 MED ORDER — PANTOPRAZOLE SODIUM 40 MG PO TBEC: 40.0000 mg | DELAYED_RELEASE_TABLET | Freq: Every day | ORAL | 0 refills | Status: DC

## 2019-06-10 MED ORDER — PREDNISONE 20 MG PO TABS: 20.0000 mg | ORAL_TABLET | Freq: Every day | ORAL | 0 refills | Status: DC

## 2019-06-10 NOTE — Discharge Summary (Addendum)
Physician Discharge Summary  Jeffery Mcintosh IEP:[id number] DOB: 02/04/1980 DOA: 06/06/2019  PCP: Jeffery Mcintosh, No Pcp Per  Admit date: 06/06/2019 Discharge date: 06/10/2019  Admitted From: Home  Disposition:  Home   Recommendations for Outpatient Follow-up and new medication changes:  1. Follow up with Gastroenterology at [facility] on June 22.  2. Continue taking prednisone 20 mg daily until follow up with Gastroenterology. 3. Follow up wit primary care in 7 days.   Home Health: no   Equipment/Devices: no    Discharge Condition: stable  CODE STATUS: full  Diet recommendation: soft and advance as tolerated.   Brief/Interim Summary: Jeffery Mcintosh was admitted to the hospital with a working diagnosis of acute Crohn's colitis flare.  39 year old male who presented with nausea and abdominal pain. He does have significant past medical history for bipolar disorder, Crohn's disease, GERD and a recent hospitalization for suicide of attempt inApril/15/21. At home Jeffery Mcintosh had nausea and abdominal pain, persistent for 48 hours mainly of right lower quadrant. On his initial physical examination blood pressure 149/87, heart rate 115, respiratory rate16, temperature 99.1, oxygen saturation 94%. His lungs were clear to auscultation, heart S1-S2 present rhythmic, his abdomen was tender to palpation with positive guarding, mainly at the right lower quadrant, no lower extremity edema. Sodium 139, potassium 3.8,chloride 103, bicarb 23, glucose 92, BUN 11, creatinine 0.79, white count 7.3, hemoglobin 15.0, hematocrit 42.6, platelets 334. SARS COVID-19 negative.Urine analysis negative for infection. CT of the abdomen with marked inflammation involving the terminal ileum and cecum centereduponthe ileocecal valve consistent with active Crohn's flare. Persistent several fistulous connections between the distal segments of the ileum. Mild air and fluid distention small bowel loop.  Jeffery Mcintosh  was placed on high doseIVcorticosteroids, antibiotics and as needed antiemetics / analgesics.Placed on bowel rest with nothing by mouth.   His symptoms slowly improved, antibiotics were discontinue and his diet has been advanced with good toleration.   1. Acute Crohn's colitis complicated with partial small bowel obstruction.  Jeffery Mcintosh was admitted to the medical ward, he responded well to medical therapy with intravenous corticosteroids.  He received a short course of antibiotics.  His diet was advanced progressively with good toleration.  Jeffery Mcintosh will continue taking prednisone 20 mg daily until follow-up with gastroenterology at [facility] on June 22.  He was explained about the nature of his Crohn's colitis, fistulizing disease localized to the terminal ileum, he will benefit from biologic agents such as Remicade as an outpatient.  2.  Bipolar disease.  Continue carbamazepine and fluoxetine.  3.  Tobacco abuse.  Continue nicotine patch  4.  GERD. Continue with pantoprazole.     Discharge Diagnoses:  Principal Problem:   Exacerbation of Crohn's disease ([place]) Active Problems:   SBO (small bowel obstruction) ([place])   Bipolar 1 disorder (H[facility])   MDD (major depressive disorder), recurrent severe, without psychosis ([place])   GERD (gastroesophageal reflux disease)    Discharge Instructions  Discharge Instructions    Diet - low sodium heart healthy   Complete by: As directed    Discharge instructions   Complete by: As directed    Please follow with primary care in 7 days, follow with gastroenterology as scheduled.   Increase activity slowly   Complete by: As directed      Allergies as of 06/10/2019   No Known Allergies     Medication List    STOP taking these medications   Oxycodone HCl 10 MG Tabs     TAKE these medications  carbamazepine 100 MG chewable tablet Commonly known as: TEGRETOL Chew 1 tablet (100 mg total) by mouth 2 (two) times daily. For mood  stabilization   FLUoxetine 40 MG capsule Commonly known as: PROZAC Take 1 capsule (40 mg total) by mouth daily. For depression   nicotine 21 mg/24hr patch Commonly known as: NICODERM CQ - dosed in mg/24 hours Place 1 patch (21 mg total) onto the skin daily. (May buy from over the counter): For smoking cessation   oxyCODONE-acetaminophen 5-325 MG tablet Commonly known as: PERCOCET/ROXICET Take 1 tablet by mouth every 6 (six) hours as needed for severe pain.   pantoprazole 40 MG tablet Commonly known as: PROTONIX Take 1 tablet (40 mg total) by mouth daily. For acid reflux   predniSONE 20 MG tablet Commonly known as: DELTASONE Take 1 tablet (20 mg total) by mouth daily with breakfast. What changed: See the new instructions.       No Known Allergies  Consultations:  Gastroenterology   Procedures/Studies: DG Abd 1 View  Result Date: 06/09/2019 CLINICAL DATA:  Abdominal pain EXAM: ABDOMEN - 1 VIEW COMPARISON:  Jun 08, 2019 FINDINGS: There is residual contrast in the colon. There is no bowel dilatation or air-fluid level to suggest bowel obstruction. No free air. Lung bases are clear. IMPRESSION: No evident bowel obstruction or free air.  Lung bases clear. Electronically Signed   By: Lowella Grip III M.D.   On: 06/09/2019 07:56   DG Abd 1 View  Result Date: 06/08/2019 CLINICAL DATA:  Small bowel obstruction EXAM: ABDOMEN - 1 VIEW COMPARISON:  Abdominal CT from yesterday FINDINGS: Oral contrast is now distributed throughout the nondilated colon. There are a few superimposed distended small bowel loops measuring up to 3 cm in diameter. No concerning mass effect or gas collection. IMPRESSION: 1. Progression of oral contrast into the nondilated colon. 2. Mildly distended small bowel loops in the central abdomen that are similar to yesterday. Electronically Signed   By: Monte Fantasia M.D.   On: 06/08/2019 05:26   DG Abd 1 View  Result Date: 05/20/2019 CLINICAL DATA:  Abdominal  pain and distention. EXAM: ABDOMEN - 1 VIEW COMPARISON:  April 20, 2018. FINDINGS: No abnormal bowel dilatation is noted. Mild amount of stool seen throughout the colon. No radio-opaque calculi or other significant radiographic abnormality are seen. IMPRESSION: Mild stool burden. No evidence of bowel obstruction or ileus. Electronically Signed   By: Marijo Conception M.D.   On: 05/20/2019 14:48   CT ABDOMEN PELVIS W CONTRAST  Result Date: 06/07/2019 CLINICAL DATA:  History of Crohn's disease, concern for Crohn's flare, nausea and generalized abdominal pain for 2 days EXAM: CT ABDOMEN AND PELVIS WITH CONTRAST TECHNIQUE: Multidetector CT imaging of the abdomen and pelvis was performed using the standard protocol following bolus administration of intravenous contrast. CONTRAST:  136m OMNIPAQUE IOHEXOL 300 MG/ML  SOLN COMPARISON:  CT 05/18/2019, radiograph 05/20/2019 FINDINGS: Lower chest: Lung bases are clear. Normal heart size. No pericardial effusion. Hepatobiliary: No focal liver abnormality is seen. No gallstones, gallbladder wall thickening, or biliary dilatation. Pancreas: Unremarkable. No pancreatic ductal dilatation or surrounding inflammatory changes. Spleen: Normal in size without focal abnormality. Adrenals/Urinary Tract: Adrenal glands are unremarkable. Kidneys are normal, without renal calculi, focal lesion, or hydronephrosis. Bladder is unremarkable. Stomach/Bowel: Distal esophagus, stomach and duodenum are unremarkable. There is marked inflammation involving the terminal ileum and cecum centered upon the ileocecal valve. Redemonstration of several fistulous connections between the distal segments of the ileum and terminal ileum (  5/59, 64). No extraluminal air or fluid. The appendix arises from the cecal tip and courses directly towards the inflamed loops of bowel.Borderline upstream dilatation with several small bowel loops measuring up to 3 cm in diameter with layering air-fluid levels may reflect a  degree of resulting obstruction. A separate loop of circumferential thickening is seen in the mid abdomen (2/38) which could reflect a second site of inflammation. Some mild thickening in a region reactive though inflammatory tethering of the appendix in this vicinity. Distal colon is unremarkable. Vascular/Lymphatic: The aorta is normal caliber. Some reactive adenopathy present in the right lower quadrant. No pathologically enlarged abdominopelvic nodes. Reproductive: The prostate and seminal vesicles are unremarkable. Other: No free abdominopelvic fluid or gas. No bowel containing hernias. Musculoskeletal: No acute osseous abnormality or suspicious osseous lesion. Mild degenerative changes in both hips. IMPRESSION: 1. Marked inflammation involving the terminal ileum and cecum centered upon the ileocecal valve compatible with active Crohn's flare. Redemonstration of several fistulous connections between the distal segments of the ileum. The appendix arises from the cecal tip and courses directly towards the inflamed loops of bowel with possible inflammatory tethering and some likely reactive thickening. 2. Mild air and fluid distention of the upstream loops of small bowel may reflect a degree of low-grade or partial small bowel obstruction. 3. Separate focal segment of mural thickening in the mid abdomen could reflect underdistention or a second site of inflammation (2/38). Electronically Signed   By: Lovena Le M.D.   On: 06/07/2019 01:16   CT Abdomen Pelvis W Contrast  Result Date: 05/18/2019 CLINICAL DATA:  Right lower quadrant pain for 2 weeks. History of Crohn disease. EXAM: CT ABDOMEN AND PELVIS WITH CONTRAST TECHNIQUE: Multidetector CT imaging of the abdomen and pelvis was performed using the standard protocol following bolus administration of intravenous contrast. CONTRAST:  121m OMNIPAQUE IOHEXOL 300 MG/ML  SOLN COMPARISON:  11/29/2018 FINDINGS: Lower chest: Clear lung bases. Normal heart size  without pericardial or pleural effusion. Hepatobiliary: Normal liver. Normal gallbladder, without biliary ductal dilatation. Pancreas: Normal, without mass or ductal dilatation. Spleen: Normal in size, without focal abnormality. Adrenals/Urinary Tract: Normal adrenal glands. Normal kidneys, without hydronephrosis. Normal urinary bladder. Stomach/Bowel: Normal stomach, without wall thickening. Moderate to marked inflammation involves the distal and terminal ileum, slightly increased compared to 01/04/2019. Progressive presumably secondary involvement of the cecum. Example 51/3. Fistulous are again identified between the terminal and distal ileum, including on this image. No surrounding abscess. The appendix is at least partially visualized including on 51/3 and uninvolved. No free intraperitoneal air. Small bowel loops just proximal to the inflammation measure up to 2.9 cm. Apparent wall thickening of mid small bowel loops including on 44/3 may be due to underdistention. Vascular/Lymphatic: Aortic atherosclerosis. No abdominopelvic adenopathy. Reproductive: Normal prostate. Other: No significant free fluid. Musculoskeletal: No sacroiliitis. IMPRESSION: 1. Active inflammation/Crohn disease involving the distal/terminal ileum and adjacent cecum. Progressive compared to 01/04/2019. Areas of fistulous communication between small bowel loops again identified. No drainable abscess. 2. Small bowel just proximal to the inflammation measures up to 2.9 cm. Cannot exclude low-grade partial small-bowel obstruction. 3. Mid small bowel loops appear thick walled, but are underdistended. Equivocal for more proximal Crohn disease. 4.  Aortic Atherosclerosis (ICD10-I70.0). Electronically Signed   By: KAbigail MiyamotoM.D.   On: 05/18/2019 09:45   DG Humerus Right  Result Date: 05/19/2019 CLINICAL DATA:  [place] EXAM: RIGHT HUMERUS - 2+ VIEW COMPARISON:  None. FINDINGS: There is no evidence of fracture or  other focal bone  lesions. Soft tissues are unremarkable. IMPRESSION: Negative. Electronically Signed   By: Donavan Foil M.D.   On: 05/19/2019 01:38        Subjective: Jeffery Mcintosh is feeling better, abdominal pain has improved, no nausea or vomiting, no chest pain or dyspnea.   Discharge Exam: Vitals:   06/09/19 2143 06/10/19 0552  BP: (!) 147/68 (!) 151/86  Pulse: 75 69  Resp: 18 19  Temp: 98.6 F (37 C) 97.7 F (36.5 C)  SpO2: 99% 99%   Vitals:   06/09/19 0535 06/09/19 1405 06/09/19 2143 06/10/19 0552  BP: 139/87 132/69 (!) 147/68 (!) 151/86  Pulse: 70 79 75 69  Resp: 18 18 18 19   Temp: 97.6 F (36.4 C) 98.3 F (36.8 C) 98.6 F (37 C) 97.7 F (36.5 C)  TempSrc: Oral Oral Oral Oral  SpO2: 99% 99% 99% 99%  Weight:      Height:        General: Not in pain or dyspnea  Neurology: Awake and alert, non focal  E ENT: no pallor, no icterus, oral mucosa moist Cardiovascular: No JVD. S1-S2 present, rhythmic, no gallops, rubs, or murmurs. No lower extremity edema. Pulmonary:  Positive breath sounds bilaterally, adequate air movement, no wheezing, rhonchi or rales. Gastrointestinal. Abdomen with no organomegaly, non tender, no rebound or guarding Skin. No rashes Musculoskeletal: no joint deformities   The results of significant diagnostics from this hospitalization (including imaging, microbiology, ancillary and laboratory) are listed below for reference.     Microbiology: Recent Results (from the past 240 hour(s))  Respiratory Panel by RT PCR (Flu A&B, Covid) - Nasopharyngeal Swab     Status: None   Collection Time: 06/07/19  2:03 AM   Specimen: Nasopharyngeal Swab  Result Value Ref Range Status   SARS Coronavirus 2 by RT PCR NEGATIVE NEGATIVE Final    Comment: (NOTE) SARS-CoV-2 target nucleic acids are NOT DETECTED. The SARS-CoV-2 RNA is generally detectable in upper respiratoy specimens during the acute phase of infection. The lowest concentration of SARS-CoV-2 viral copies this assay  can detect is 131 copies/mL. A negative result does not preclude SARS-Cov-2 infection and should not be used as the sole basis for treatment or other Jeffery Mcintosh management decisions. A negative result may occur with  improper specimen collection/handling, submission of specimen other than nasopharyngeal swab, presence of viral mutation(s) within the areas targeted by this assay, and inadequate number of viral copies (<131 copies/mL). A negative result must be combined with clinical observations, Jeffery Mcintosh history, and epidemiological information. The expected result is Negative. Fact Sheet for Patients:  PinkCheek.be Fact Sheet for Healthcare Providers:  GravelBags.it This test is not yet ap proved or cleared by the [place] FDA and  has been authorized for detection and/or diagnosis of SARS-CoV-2 by FDA under an Emergency Use Authorization (EUA). This EUA will remain  in effect (meaning this test can be used) for the duration of the COVID-19 declaration under Section 564(b)(1) of the Act, 21 U.S.C. section 360bbb-3(b)(1), unless the authorization is terminated or revoked sooner.    Influenza A by PCR NEGATIVE NEGATIVE Final   Influenza B by PCR NEGATIVE NEGATIVE Final    Comment: (NOTE) The Xpert Xpress SARS-CoV-2/FLU/RSV assay is intended as an aid in  the diagnosis of influenza from Nasopharyngeal swab specimens and  should not be used as a sole basis for treatment. Nasal washings and  aspirates are unacceptable for Xpert Xpress SARS-CoV-2/FLU/RSV  testing. Fact Sheet for Patients: PinkCheek.be Fact Sheet  for Healthcare Providers: GravelBags.it This test is not yet approved or cleared by the [place] and  has been authorized for detection and/or diagnosis of SARS-CoV-2 by  FDA under an Emergency Use Authorization (EUA). This EUA will remain  in effect  (meaning this test can be used) for the duration of the  Covid-19 declaration under Section 564(b)(1) of the Act, 21  U.S.C. section 360bbb-3(b)(1), unless the authorization is  terminated or revoked. Performed at [facility], [place] [address] [place], [place] [address]      Labs: BNP (last 3 results) No results for input(s): BNP in the last 8760 hours. Basic Metabolic Panel: Recent Labs  Lab 06/06/19 2020 06/07/19 0433 06/08/19 0843 06/09/19 0444 06/10/19 0419  NA 139 135 137 137 137  K 3.8 5.2* 4.3 4.5 4.4  CL 103 102 102 100 100  CO2 23 22 25 27 28   GLUCOSE 92 128* 151* 124* 147*  BUN 11 13 11 13 14   CREATININE 0.79 0.86 0.81 0.76 0.84  CALCIUM 9.3 8.7* 8.8* 9.1 8.8*   Liver Function Tests: Recent Labs  Lab 06/06/19 2020  AST 14*  ALT 34  ALKPHOS 111  BILITOT 0.7  PROT 7.3  ALBUMIN 4.0   Recent Labs  Lab 06/06/19 2020  LIPASE 40   No results for input(s): AMMONIA in the last 168 hours. CBC: Recent Labs  Lab 06/06/19 2020 06/08/19 0843 06/09/19 0444 06/10/19 0419  WBC 7.3 9.2 10.4 13.5*  NEUTROABS  --   --  9.0* 11.6*  HGB 15.0 12.1* 13.2 12.7*  HCT 42.6 36.1* 39.0 38.4*  MCV 88.8 88.7 90.3 90.6  PLT 334 260 291 299   Cardiac Enzymes: No results for input(s): CKTOTAL, CKMB, CKMBINDEX, TROPONINI in the last 168 hours. BNP: Invalid input(s): POCBNP CBG: No results for input(s): GLUCAP in the last 168 hours. D-Dimer No results for input(s): DDIMER in the last 72 hours. Hgb A1c No results for input(s): HGBA1C in the last 72 hours. Lipid Profile No results for input(s): CHOL, HDL, LDLCALC, TRIG, CHOLHDL, LDLDIRECT in the last 72 hours. Thyroid function studies No results for input(s): TSH, T4TOTAL, T3FREE, THYROIDAB in the last 72 hours.  Invalid input(s): FREET3 Anemia work up No results for input(s): VITAMINB12, FOLATE, FERRITIN, TIBC, IRON, RETICCTPCT in the last 72 hours. Urinalysis    Component Value Date/Time    COLORURINE STRAW (A) 06/06/2019 2020   APPEARANCEUR CLEAR 06/06/2019 2020   LABSPEC 1.004 (L) 06/06/2019 2020   PHURINE 5.0 06/06/2019 2020   GLUCOSEU NEGATIVE 06/06/2019 2020   GLUCOSEU NEGATIVE 04/18/2013 1418   HGBUR NEGATIVE 06/06/2019 2020   BILIRUBINUR NEGATIVE 06/06/2019 2020   KETONESUR NEGATIVE 06/06/2019 2020   PROTEINUR NEGATIVE 06/06/2019 2020   UROBILINOGEN 1.0 11/16/2014 1719   NITRITE NEGATIVE 06/06/2019 2020   LEUKOCYTESUR NEGATIVE 06/06/2019 2020   Sepsis Labs Invalid input(s): PROCALCITONIN,  WBC,  LACTICIDVEN Microbiology Recent Results (from the past 240 hour(s))  Respiratory Panel by RT PCR (Flu A&B, Covid) - Nasopharyngeal Swab     Status: None   Collection Time: 06/07/19  2:03 AM   Specimen: Nasopharyngeal Swab  Result Value Ref Range Status   SARS Coronavirus 2 by RT PCR NEGATIVE NEGATIVE Final    Comment: (NOTE) SARS-CoV-2 target nucleic acids are NOT DETECTED. The SARS-CoV-2 RNA is generally detectable in upper respiratoy specimens during the acute phase of infection. The lowest concentration of SARS-CoV-2 viral copies this assay can detect is 131 copies/mL. A negative result does  not preclude SARS-Cov-2 infection and should not be used as the sole basis for treatment or other Jeffery Mcintosh management decisions. A negative result may occur with  improper specimen collection/handling, submission of specimen other than nasopharyngeal swab, presence of viral mutation(s) within the areas targeted by this assay, and inadequate number of viral copies (<131 copies/mL). A negative result must be combined with clinical observations, Jeffery Mcintosh history, and epidemiological information. The expected result is Negative. Fact Sheet for Patients:  PinkCheek.be Fact Sheet for Healthcare Providers:  GravelBags.it This test is not yet ap proved or cleared by the [place] FDA and  has been authorized for detection  and/or diagnosis of SARS-CoV-2 by FDA under an Emergency Use Authorization (EUA). This EUA will remain  in effect (meaning this test can be used) for the duration of the COVID-19 declaration under Section 564(b)(1) of the Act, 21 U.S.C. section 360bbb-3(b)(1), unless the authorization is terminated or revoked sooner.    Influenza A by PCR NEGATIVE NEGATIVE Final   Influenza B by PCR NEGATIVE NEGATIVE Final    Comment: (NOTE) The Xpert Xpress SARS-CoV-2/FLU/RSV assay is intended as an aid in  the diagnosis of influenza from Nasopharyngeal swab specimens and  should not be used as a sole basis for treatment. Nasal washings and  aspirates are unacceptable for Xpert Xpress SARS-CoV-2/FLU/RSV  testing. Fact Sheet for Patients: PinkCheek.be Fact Sheet for Healthcare Providers: GravelBags.it This test is not yet approved or cleared by the [place] FDA and  has been authorized for detection and/or diagnosis of SARS-CoV-2 by  FDA under an Emergency Use Authorization (EUA). This EUA will remain  in effect (meaning this test can be used) for the duration of the  Covid-19 declaration under Section 564(b)(1) of the Act, 21  U.S.C. section 360bbb-3(b)(1), unless the authorization is  terminated or revoked. Performed at [facility], [name] [address], [place], [place] [address]      Time coordinating discharge: 45 minutes  SIGNED:   Tawni Millers, MD  [facility] 06/10/2019, 9:16 AM

## 2019-06-13 ENCOUNTER — Encounter (HOSPITAL_COMMUNITY): Payer: Self-pay

## 2019-06-13 ENCOUNTER — Other Ambulatory Visit: Payer: Self-pay

## 2019-06-13 DIAGNOSIS — R1084 Generalized abdominal pain: Secondary | ICD-10-CM | POA: Diagnosis not present

## 2019-06-13 DIAGNOSIS — Z87891 Personal history of nicotine dependence: Secondary | ICD-10-CM | POA: Insufficient documentation

## 2019-06-13 DIAGNOSIS — Z79899 Other long term (current) drug therapy: Secondary | ICD-10-CM | POA: Diagnosis not present

## 2019-06-13 DIAGNOSIS — R109 Unspecified abdominal pain: Secondary | ICD-10-CM | POA: Diagnosis present

## 2019-06-13 DIAGNOSIS — I10 Essential (primary) hypertension: Secondary | ICD-10-CM | POA: Insufficient documentation

## 2019-06-13 LAB — URINALYSIS, ROUTINE W REFLEX MICROSCOPIC
Bilirubin Urine: NEGATIVE
Glucose, UA: NEGATIVE mg/dL
Hgb urine dipstick: NEGATIVE
Ketones, ur: NEGATIVE mg/dL
Leukocytes,Ua: NEGATIVE
Nitrite: NEGATIVE
Protein, ur: NEGATIVE mg/dL
Specific Gravity, Urine: 1.006 (ref 1.005–1.030)
pH: 5 (ref 5.0–8.0)

## 2019-06-13 LAB — COMPREHENSIVE METABOLIC PANEL
ALT: 34 U/L (ref 0–44)
AST: 18 U/L (ref 15–41)
Albumin: 3.8 g/dL (ref 3.5–5.0)
Alkaline Phosphatase: 94 U/L (ref 38–126)
Anion gap: 13 (ref 5–15)
BUN: 16 mg/dL (ref 6–20)
CO2: 26 mmol/L (ref 22–32)
Calcium: 8.4 mg/dL — ABNORMAL LOW (ref 8.9–10.3)
Chloride: 101 mmol/L (ref 98–111)
Creatinine, Ser: 1.12 mg/dL (ref 0.61–1.24)
GFR calc Af Amer: >60 mL/min (ref >60)
GFR calc non Af Amer: >60 mL/min (ref >60)
Glucose, Bld: 93 mg/dL (ref 70–99)
Potassium: 3.5 mmol/L (ref 3.5–5.1)
Sodium: 140 mmol/L (ref 135–145)
Total Bilirubin: 0.2 mg/dL — ABNORMAL LOW (ref 0.3–1.2)
Total Protein: 6.7 g/dL (ref 6.5–8.1)

## 2019-06-13 LAB — LIPASE, BLOOD: Lipase: 39 U/L (ref 11–51)

## 2019-06-13 LAB — CBC
HCT: 36.5 % — ABNORMAL LOW (ref 39.0–52.0)
Hemoglobin: 12.6 g/dL — ABNORMAL LOW (ref 13.0–17.0)
MCH: 30.5 pg (ref 26.0–34.0)
MCHC: 34.5 g/dL (ref 30.0–36.0)
MCV: 88.4 fL (ref 80.0–100.0)
Platelets: 378 10*3/uL (ref 150–400)
RBC: 4.13 MIL/uL — ABNORMAL LOW (ref 4.22–5.81)
RDW: 14.7 % (ref 11.5–15.5)
WBC: 10.9 10*3/uL — ABNORMAL HIGH (ref 4.0–10.5)
nRBC: 0 % (ref 0.0–0.2)

## 2019-06-13 NOTE — ED Triage Notes (Signed)
Pt presents with typical [place] pain. Sts just discharged on Friday. Taken prednisone as prescribed. Generalized abd pain and nausea. Denies fever or vomiting.

## 2019-06-14 ENCOUNTER — Emergency Department (HOSPITAL_COMMUNITY)
Admission: EM | Admit: 2019-06-14 | Discharge: 2019-06-14 | Disposition: A | Discharge status: Home / Self Care | Payer: Medicare Other | Attending: Emergency Medicine | Admitting: Emergency Medicine

## 2019-06-14 DIAGNOSIS — K50013 Crohn's disease of small intestine with fistula: Secondary | ICD-10-CM | POA: Diagnosis not present

## 2019-06-14 DIAGNOSIS — R1084 Generalized abdominal pain: Secondary | ICD-10-CM

## 2019-06-14 DIAGNOSIS — K5090 Crohn's disease, unspecified, without complications: Secondary | ICD-10-CM | POA: Diagnosis not present

## 2019-06-14 MED ORDER — ONDANSETRON HCL 4 MG/2ML IJ SOLN
4.0000 mg | Freq: Once | INTRAMUSCULAR | Status: AC
  Administered 2019-06-14: 4 mg via INTRAVENOUS
  Filled 2019-06-14: qty 2

## 2019-06-14 MED ORDER — HYDROMORPHONE HCL 1 MG/ML IJ SOLN
1.0000 mg | Freq: Once | INTRAMUSCULAR | Status: AC
  Administered 2019-06-14: 1 mg via INTRAVENOUS
  Filled 2019-06-14: qty 1

## 2019-06-14 MED ORDER — OXYCODONE HCL 5 MG PO CAPS: 5.0000 mg | ORAL_CAPSULE | ORAL | 0 refills | Status: DC | PRN

## 2019-06-14 MED ORDER — SODIUM CHLORIDE 0.9 % IV BOLUS
1000.0000 mL | Freq: Once | INTRAVENOUS | Status: AC
  Administered 2019-06-14: 1000 mL via INTRAVENOUS

## 2019-06-14 MED ORDER — OXYCODONE HCL 5 MG PO TABS
10.0000 mg | ORAL_TABLET | Freq: Once | ORAL | Status: AC
  Administered 2019-06-14: 10 mg via ORAL
  Filled 2019-06-14: qty 2

## 2019-06-14 NOTE — ED Notes (Signed)
Patient educated about not driving or performing other critical tasks (such as operating heavy machinery, caring for infant/toddler/child) due to sedative nature of narcotic medications received while in the ED.  Pt/caregiver verbalized understanding.   

## 2019-06-14 NOTE — Discharge Instructions (Addendum)
Prescription given for OxyIR to take as prescribed as needed for pain.  Do not drive or operate machinery if taking this medication. Call your doctor's office in the morning for follow-up.  Return to ER for worsening pain, fever, vomiting or other concerns. Your pain may be made worse by your constipation.  Narcotic pain medications will make constipation worse.  It is important to take stool softeners and laxatives such as MiraLAX as advised by your GI for this.

## 2019-06-14 NOTE — ED Provider Notes (Signed)
[place] DEPT Provider Note   CSN: [id number] Arrival date & time: 06/13/19  2033     History Chief Complaint  Patient presents with  . Abdominal Pain    Jeffery Mcintosh is a 39 y.o. male.  39 yo male with Crohn's disease, recently discharged on 06/10/19, pain started Saturday evening after leaving. Patient is taking Prednisone as prescribed at discharge. Pain located diffuse abdomen, constant, pain is worse with eating. Denies nausea, vomiting. Last bowel movement was possibly Friday. Denies fevers, chills, sweats. No prior abdominal surgeries.  Reports pain today is similar to prior pain episodes, no new or worsening symptoms.        Past Medical History:  Diagnosis Date  . Anxiety   . Arthritis   . Bipolar affective ([place])   . Chronic headaches   . Crohn's disease ([place]) history of  . Depression   . GERD (gastroesophageal reflux disease)   . Hypertension   . Noncompliance 04/17/2018  . PTSD (post-traumatic stress disorder) 04/17/2018  . Schizophrenic disorder [facility])     Patient Active Problem List   Diagnosis Date Noted  . GERD (gastroesophageal reflux disease) 06/07/2019  . 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 Crohn's colitis ([place]) 05/20/2019  . Suicidal ideation 05/19/2019  . Crohn's colitis, with fistula ([place]) 05/19/2019  . Crohn's colitis, with intestinal obstruction ([place]) 10/11/2018  . Crohn's disease of colon with intestinal obstruction ([place])   . Noncompliance 04/17/2018  . PTSD (post-traumatic stress disorder) 04/17/2018  . Crohn's disease of jejunum with intestinal obstruction ([place]) 10/26/2017  . SBO (small bowel obstruction) ([place]) 10/26/2017  . Bipolar 1 disorder ([place]) 10/26/2017  . Crohn's colitis, unspecified complication ([place]) 27/04/5007  . Epigastric burning sensation 01/03/2015  . Numbness and tingling in left arm 11/15/2014  . Exacerbation of Crohn's disease ([place])  10/21/2014  . Crohn's disease ([place]) 10/21/2014  . Diarrhea 10/21/2014  . Abdominal pain   . Gout 04/18/2013  . Cubital tunnel syndrome 04/18/2013  . Abnormal urinalysis 04/18/2013    Past Surgical History:  Procedure Laterality Date  . NO PAST SURGERIES         Family History  Problem Relation Age of Onset  . Hypertension Mother   . Heart attack Mother   . Stroke Mother   . Alcohol abuse Father   . Mental illness Father   . Multiple sclerosis Father   . Arthritis Maternal Grandmother   . Hypertension Maternal Grandmother   . Heart disease Maternal Grandmother   . Kidney disease Maternal Grandmother   . Diabetes Maternal Grandmother   . Cancer Maternal Grandmother        great, type unknown  . Hyperlipidemia Maternal Grandfather   . Colon polyps Maternal Grandfather   . Stroke Paternal Grandmother   . Ulcerative colitis Maternal Aunt     Social History   Tobacco Use  . Smoking status: Former Smoker    Packs/day: 0.50    Years: 15.00    Pack years: 7.50    Types: Cigars  . Smokeless tobacco: Never Used  Substance Use Topics  . Alcohol use: No    Alcohol/week: 0.0 standard drinks  . Drug use: No    Home Medications Prior to Admission medications   Medication Sig Start Date End Date Taking? Authorizing Provider  carbamazepine (TEGRETOL) 100 MG chewable tablet Chew 1 tablet (100 mg total) by mouth 2 (two) times daily. For mood stabilization 05/29/19  Yes Encarnacion Slates, NP  FLUoxetine (PROZAC) 40 MG capsule Take 1 capsule (40 mg total) by mouth daily. For depression 05/30/19  Yes Nwoko, Herbert Pun I, NP  nicotine (NICODERM CQ - DOSED IN MG/24 HOURS) 21 mg/24hr patch Place 1 patch (21 mg total) onto the skin daily. (May buy from over the counter): For smoking cessation 05/30/19  Yes Lindell Spar I, NP  pantoprazole (PROTONIX) 40 MG tablet Take 1 tablet (40 mg total) by mouth daily. For acid reflux 06/10/19 07/10/19 Yes Arrien, Jimmy Picket, MD  predniSONE (DELTASONE) 20 MG  tablet Take 1 tablet (20 mg total) by mouth daily with breakfast. 06/10/19 08/05/19 Yes Arrien, Jimmy Picket, MD  oxycodone (OXY-IR) 5 MG capsule Take 1 capsule (5 mg total) by mouth every 4 (four) hours as needed. 06/14/19   Tacy Learn, PA-C  oxyCODONE-acetaminophen (PERCOCET/ROXICET) 5-325 MG tablet Take 1 tablet by mouth every 6 (six) hours as needed for severe pain. Patient not taking: Reported on 06/14/2019 06/06/19   Virgel Manifold, MD  omeprazole (PRILOSEC) 40 MG capsule Take 1 capsule (40 mg total) by mouth daily. Patient not taking: Reported on 01/18/2018 10/17/16 08/07/18  Nita Sells, MD  sucralfate (CARAFATE) 1 g tablet Take 1 tablet (1 g total) by mouth 4 (four) times daily -  with meals and at bedtime. Patient not taking: Reported on 04/17/2018 01/18/18 08/07/18  Varney Biles, MD    Allergies    Patient has no known allergies.  Review of Systems   Review of Systems  Constitutional: Negative for chills, diaphoresis and fever.  Respiratory: Negative for shortness of breath.   Cardiovascular: Negative for chest pain.  Gastrointestinal: Positive for abdominal pain and constipation. Negative for blood in stool, diarrhea, nausea and vomiting.  Genitourinary: Negative for difficulty urinating, dysuria and frequency.  Musculoskeletal: Negative for back pain and myalgias.  Skin: Negative for rash and wound.  Allergic/Immunologic: Positive for immunocompromised state.  Neurological: Negative for weakness.    Physical Exam Updated Vital Signs BP (!) 149/77   Pulse 94   Temp 98.3 F (36.8 C)   Resp 18   Ht 5' 4"  (1.626 m)   Wt 68.5 kg   SpO2 95%   BMI 25.92 kg/m   Physical Exam Vitals and nursing note reviewed.  Constitutional:      General: He is not in acute distress.    Appearance: He is well-developed. He is not diaphoretic.  HENT:     Head: Normocephalic and atraumatic.  Cardiovascular:     Rate and Rhythm: Normal rate and regular rhythm.     Heart sounds:  Normal heart sounds.  Pulmonary:     Effort: Pulmonary effort is normal.     Breath sounds: Normal breath sounds.  Abdominal:     General: Bowel sounds are normal.     Palpations: Abdomen is soft.     Tenderness: There is generalized abdominal tenderness.  Skin:    General: Skin is warm and dry.     Findings: No rash.  Neurological:     Mental Status: He is alert and oriented to person, place, and time.  Psychiatric:        Behavior: Behavior normal.     ED Results / Procedures / Treatments   Labs (all labs ordered are listed, but only abnormal results are displayed) Labs Reviewed  COMPREHENSIVE METABOLIC PANEL - Abnormal; Notable for the following components:      Result Value   Calcium 8.4 (*)    Total Bilirubin 0.2 (*)  All other components within normal limits  CBC - Abnormal; Notable for the following components:   WBC 10.9 (*)    RBC 4.13 (*)    Hemoglobin 12.6 (*)    HCT 36.5 (*)    All other components within normal limits  URINALYSIS, ROUTINE W REFLEX MICROSCOPIC - Abnormal; Notable for the following components:   Color, Urine STRAW (*)    All other components within normal limits  LIPASE, BLOOD    EKG None  Radiology No results found.  Procedures Procedures (including critical care time)  Medications Ordered in ED Medications  oxyCODONE (Oxy IR/ROXICODONE) immediate release tablet 10 mg (has no administration in time range)  sodium chloride 0.9 % bolus 1,000 mL (1,000 mLs Intravenous New Bag/Given 06/14/19 0146)  ondansetron (ZOFRAN) injection 4 mg (4 mg Intravenous Given 06/14/19 0146)  HYDROmorphone (DILAUDID) injection 1 mg (1 mg Intravenous Given 06/14/19 0146)  ondansetron (ZOFRAN) injection 4 mg (4 mg Intravenous Given 06/14/19 0251)  HYDROmorphone (DILAUDID) injection 1 mg (1 mg Intravenous Given 06/14/19 0251)    ED Course  I have reviewed the triage vital signs and the nursing notes.  Pertinent labs & imaging results that were available  during my care of the patient were reviewed by me and considered in my medical decision making (see chart for details).  Clinical Course as of Jun 14 354  Tue Jun 14, 1167  [id number] 39 year old male with history of Crohn's disease presents with abdominal pain.  Patient denies nausea, vomiting, states that he has not had a bowel movement in 3 days however is still passing gas.  Pain is similar to prior Crohn's flares, he was recently admitted to the hospital discharged home on Percocet and prednisone.  Patient is taking his medications without improvement in his symptoms, is not scheduled to see his GI until the end of June, is currently scheduled to see his PCP on May 20.  On exam, has generalized abdominal tenderness and appears uncomfortable.  Patient was given IV fluids as well as 1 dose of Dilaudid.  Pain did not improve after 1 dose and was given a second dose of Dilaudid.  Review of labs, no significant changes from priors including CBC, CMP, urinalysis and lipase.  Patient is afebrile.  Shared decision making with patient, as this is recurrent pain with no new or different episodes, discussed possibility of CT imaging tonight in the ER however he has had 5 CT scans in the past year and labs are without significant findings tonight.  Patient agrees to not CT at this time, would like to be discharged although was offered admission to the hospital for further pain control.  Patient states 5 mg oxycodone is not helping with his pain, will give 1 dose of OxyIR tonight with prescription for same.  Also discussed possibility of his constipation worsening his pain, narcotic pain medications may be causing him to be constipated and advised to manage with stool softeners and laxatives as recommended by prior GI.  Patient is agreeable with plan of care, will return to ER for worsening or concerning symptoms, fever.   [LM]    Clinical Course User Index [LM] Roque Lias   MDM Rules/Calculators/A&P                       Final Clinical Impression(s) / ED Diagnoses Final diagnoses:  Generalized abdominal pain    Rx / DC Orders ED Discharge Orders  Ordered    oxycodone (OXY-IR) 5 MG capsule  Every 4 hours PRN     06/14/19 0352           Tacy Learn, PA-C 06/14/19 0356    Mesner, Corene Cornea, MD 06/14/19 [phone]

## 2019-06-17 ENCOUNTER — Encounter (HOSPITAL_COMMUNITY): Payer: Self-pay

## 2019-06-17 ENCOUNTER — Emergency Department (HOSPITAL_COMMUNITY): Payer: Medicare Other

## 2019-06-17 ENCOUNTER — Other Ambulatory Visit: Payer: Self-pay

## 2019-06-17 ENCOUNTER — Inpatient Hospital Stay (HOSPITAL_COMMUNITY)
Admission: EM | Admit: 2019-06-17 | Discharge: 2019-06-22 | DRG: 387 | Disposition: A | Discharge status: Home / Self Care | Payer: Medicare Other | Attending: Family Medicine | Admitting: Family Medicine

## 2019-06-17 DIAGNOSIS — Z83438 Family history of other disorder of lipoprotein metabolism and other lipidemia: Secondary | ICD-10-CM | POA: Diagnosis not present

## 2019-06-17 DIAGNOSIS — M1990 Unspecified osteoarthritis, unspecified site: Secondary | ICD-10-CM | POA: Diagnosis present

## 2019-06-17 DIAGNOSIS — Z8371 Family history of colonic polyps: Secondary | ICD-10-CM

## 2019-06-17 DIAGNOSIS — Z87891 Personal history of nicotine dependence: Secondary | ICD-10-CM | POA: Diagnosis not present

## 2019-06-17 DIAGNOSIS — D72829 Elevated white blood cell count, unspecified: Secondary | ICD-10-CM | POA: Diagnosis present

## 2019-06-17 DIAGNOSIS — Z79899 Other long term (current) drug therapy: Secondary | ICD-10-CM | POA: Diagnosis not present

## 2019-06-17 DIAGNOSIS — K50913 Crohn's disease, unspecified, with fistula: Secondary | ICD-10-CM | POA: Diagnosis not present

## 2019-06-17 DIAGNOSIS — Z9119 Patient's noncompliance with other medical treatment and regimen: Secondary | ICD-10-CM

## 2019-06-17 DIAGNOSIS — D649 Anemia, unspecified: Secondary | ICD-10-CM | POA: Diagnosis present

## 2019-06-17 DIAGNOSIS — K50113 Crohn's disease of large intestine with fistula: Secondary | ICD-10-CM | POA: Diagnosis not present

## 2019-06-17 DIAGNOSIS — F209 Schizophrenia, unspecified: Secondary | ICD-10-CM | POA: Diagnosis present

## 2019-06-17 DIAGNOSIS — F419 Anxiety disorder, unspecified: Secondary | ICD-10-CM | POA: Diagnosis present

## 2019-06-17 DIAGNOSIS — Z823 Family history of stroke: Secondary | ICD-10-CM | POA: Diagnosis not present

## 2019-06-17 DIAGNOSIS — R519 Headache, unspecified: Secondary | ICD-10-CM | POA: Diagnosis present

## 2019-06-17 DIAGNOSIS — Z20822 Contact with and (suspected) exposure to covid-19: Secondary | ICD-10-CM | POA: Diagnosis present

## 2019-06-17 DIAGNOSIS — F319 Bipolar disorder, unspecified: Secondary | ICD-10-CM | POA: Diagnosis present

## 2019-06-17 DIAGNOSIS — K219 Gastro-esophageal reflux disease without esophagitis: Secondary | ICD-10-CM | POA: Diagnosis present

## 2019-06-17 DIAGNOSIS — K50013 Crohn's disease of small intestine with fistula: Principal | ICD-10-CM | POA: Diagnosis present

## 2019-06-17 DIAGNOSIS — Z8249 Family history of ischemic heart disease and other diseases of the circulatory system: Secondary | ICD-10-CM | POA: Diagnosis not present

## 2019-06-17 DIAGNOSIS — Z833 Family history of diabetes mellitus: Secondary | ICD-10-CM

## 2019-06-17 DIAGNOSIS — Z820 Family history of epilepsy and other diseases of the nervous system: Secondary | ICD-10-CM

## 2019-06-17 DIAGNOSIS — K5090 Crohn's disease, unspecified, without complications: Secondary | ICD-10-CM | POA: Diagnosis present

## 2019-06-17 DIAGNOSIS — I10 Essential (primary) hypertension: Secondary | ICD-10-CM | POA: Diagnosis present

## 2019-06-17 DIAGNOSIS — Z811 Family history of alcohol abuse and dependence: Secondary | ICD-10-CM

## 2019-06-17 DIAGNOSIS — K50919 Crohn's disease, unspecified, with unspecified complications: Secondary | ICD-10-CM

## 2019-06-17 DIAGNOSIS — Z818 Family history of other mental and behavioral disorders: Secondary | ICD-10-CM | POA: Diagnosis not present

## 2019-06-17 DIAGNOSIS — F4310 Post-traumatic stress disorder, unspecified: Secondary | ICD-10-CM | POA: Diagnosis present

## 2019-06-17 DIAGNOSIS — R109 Unspecified abdominal pain: Secondary | ICD-10-CM

## 2019-06-17 DIAGNOSIS — Z8261 Family history of arthritis: Secondary | ICD-10-CM | POA: Diagnosis not present

## 2019-06-17 LAB — CBC
HCT: 37.2 % — ABNORMAL LOW (ref 39.0–52.0)
Hemoglobin: 12.7 g/dL — ABNORMAL LOW (ref 13.0–17.0)
MCH: 30.5 pg (ref 26.0–34.0)
MCHC: 34.1 g/dL (ref 30.0–36.0)
MCV: 89.4 fL (ref 80.0–100.0)
Platelets: 391 10*3/uL (ref 150–400)
RBC: 4.16 MIL/uL — ABNORMAL LOW (ref 4.22–5.81)
RDW: 14.8 % (ref 11.5–15.5)
WBC: 11.7 10*3/uL — ABNORMAL HIGH (ref 4.0–10.5)
nRBC: 0 % (ref 0.0–0.2)

## 2019-06-17 LAB — URINALYSIS, ROUTINE W REFLEX MICROSCOPIC
Bilirubin Urine: NEGATIVE
Glucose, UA: NEGATIVE mg/dL
Hgb urine dipstick: NEGATIVE
Ketones, ur: NEGATIVE mg/dL
Leukocytes,Ua: NEGATIVE
Nitrite: NEGATIVE
Protein, ur: NEGATIVE mg/dL
Specific Gravity, Urine: 1.011 (ref 1.005–1.030)
pH: 7 (ref 5.0–8.0)

## 2019-06-17 LAB — COMPREHENSIVE METABOLIC PANEL
ALT: 26 U/L (ref 0–44)
AST: 14 U/L — ABNORMAL LOW (ref 15–41)
Albumin: 3.8 g/dL (ref 3.5–5.0)
Alkaline Phosphatase: 84 U/L (ref 38–126)
Anion gap: 10 (ref 5–15)
BUN: 15 mg/dL (ref 6–20)
CO2: 28 mmol/L (ref 22–32)
Calcium: 9 mg/dL (ref 8.9–10.3)
Chloride: 101 mmol/L (ref 98–111)
Creatinine, Ser: 0.82 mg/dL (ref 0.61–1.24)
GFR calc Af Amer: >60 mL/min (ref >60)
GFR calc non Af Amer: >60 mL/min (ref >60)
Glucose, Bld: 120 mg/dL — ABNORMAL HIGH (ref 70–99)
Potassium: 4.3 mmol/L (ref 3.5–5.1)
Sodium: 139 mmol/L (ref 135–145)
Total Bilirubin: 0.5 mg/dL (ref 0.3–1.2)
Total Protein: 6.8 g/dL (ref 6.5–8.1)

## 2019-06-17 LAB — LIPASE, BLOOD: Lipase: 29 U/L (ref 11–51)

## 2019-06-17 LAB — SARS CORONAVIRUS 2 BY RT PCR (HOSPITAL ORDER, PERFORMED IN ~~LOC~~ HOSPITAL LAB): SARS Coronavirus 2: NEGATIVE

## 2019-06-17 LAB — C-REACTIVE PROTEIN: CRP: 0.5 mg/dL (ref <1.0)

## 2019-06-17 MED ORDER — ONDANSETRON HCL 4 MG/2ML IJ SOLN
4.0000 mg | Freq: Four times a day (QID) | INTRAMUSCULAR | Status: DC | PRN
  Administered 2019-06-18: 4 mg via INTRAVENOUS
  Filled 2019-06-17: qty 2

## 2019-06-17 MED ORDER — NICOTINE 14 MG/24HR TD PT24
14.0000 mg | MEDICATED_PATCH | Freq: Every day | TRANSDERMAL | Status: DC
  Administered 2019-06-17 – 2019-06-22 (×6): 14 mg via TRANSDERMAL
  Filled 2019-06-17 (×6): qty 1

## 2019-06-17 MED ORDER — HYDROMORPHONE HCL 1 MG/ML IJ SOLN
1.0000 mg | Freq: Once | INTRAMUSCULAR | Status: AC
  Administered 2019-06-17: 1 mg via INTRAVENOUS
  Filled 2019-06-17: qty 1

## 2019-06-17 MED ORDER — OXYCODONE HCL 5 MG PO TABS
5.0000 mg | ORAL_TABLET | ORAL | Status: DC | PRN
  Administered 2019-06-18 – 2019-06-22 (×16): 5 mg via ORAL
  Filled 2019-06-17 (×17): qty 1

## 2019-06-17 MED ORDER — ACETAMINOPHEN 650 MG RE SUPP: 650.0000 mg | Freq: Four times a day (QID) | RECTAL | Status: DC | PRN

## 2019-06-17 MED ORDER — METHYLPREDNISOLONE SODIUM SUCC 125 MG IJ SOLR
125.0000 mg | Freq: Once | INTRAMUSCULAR | Status: AC
  Administered 2019-06-17: 125 mg via INTRAVENOUS
  Filled 2019-06-17: qty 2

## 2019-06-17 MED ORDER — FLUOXETINE HCL 20 MG PO CAPS
40.0000 mg | ORAL_CAPSULE | Freq: Every day | ORAL | Status: DC
  Administered 2019-06-17 – 2019-06-22 (×6): 40 mg via ORAL
  Filled 2019-06-17 (×6): qty 2

## 2019-06-17 MED ORDER — SODIUM CHLORIDE 0.9% FLUSH: 3.0000 mL | Freq: Once | INTRAVENOUS | Status: DC

## 2019-06-17 MED ORDER — SODIUM CHLORIDE 0.9 % IV BOLUS
1000.0000 mL | Freq: Once | INTRAVENOUS | Status: AC
  Administered 2019-06-17: 1000 mL via INTRAVENOUS

## 2019-06-17 MED ORDER — ONDANSETRON HCL 4 MG PO TABS
4.0000 mg | ORAL_TABLET | Freq: Four times a day (QID) | ORAL | Status: DC | PRN
  Administered 2019-06-20: 4 mg via ORAL
  Filled 2019-06-17: qty 1

## 2019-06-17 MED ORDER — SENNOSIDES-DOCUSATE SODIUM 8.6-50 MG PO TABS: 1.0000 | ORAL_TABLET | Freq: Every evening | ORAL | Status: DC | PRN

## 2019-06-17 MED ORDER — ONDANSETRON HCL 4 MG/2ML IJ SOLN
4.0000 mg | Freq: Once | INTRAMUSCULAR | Status: AC
  Administered 2019-06-17: 4 mg via INTRAVENOUS
  Filled 2019-06-17: qty 2

## 2019-06-17 MED ORDER — CARBAMAZEPINE 100 MG PO CHEW
100.0000 mg | CHEWABLE_TABLET | Freq: Two times a day (BID) | ORAL | Status: DC
  Administered 2019-06-17 – 2019-06-22 (×12): 100 mg via ORAL
  Filled 2019-06-17 (×13): qty 1

## 2019-06-17 MED ORDER — ACETAMINOPHEN 325 MG PO TABS
650.0000 mg | ORAL_TABLET | Freq: Four times a day (QID) | ORAL | Status: DC | PRN
  Filled 2019-06-17: qty 2

## 2019-06-17 MED ORDER — HYDROMORPHONE HCL 1 MG/ML IJ SOLN
0.5000 mg | INTRAMUSCULAR | Status: DC | PRN
  Administered 2019-06-17 – 2019-06-19 (×19): 1 mg via INTRAVENOUS
  Filled 2019-06-17 (×18): qty 1

## 2019-06-17 MED ORDER — METHYLPREDNISOLONE SODIUM SUCC 40 MG IJ SOLR
40.0000 mg | Freq: Two times a day (BID) | INTRAMUSCULAR | Status: DC
  Administered 2019-06-17 – 2019-06-19 (×4): 40 mg via INTRAVENOUS
  Filled 2019-06-17 (×4): qty 1

## 2019-06-17 MED ORDER — ENOXAPARIN SODIUM 40 MG/0.4ML ~~LOC~~ SOLN
40.0000 mg | SUBCUTANEOUS | Status: DC
  Administered 2019-06-17 – 2019-06-22 (×6): 40 mg via SUBCUTANEOUS
  Filled 2019-06-17 (×6): qty 0.4

## 2019-06-17 MED ORDER — SODIUM CHLORIDE 0.9 % IV SOLN: INTRAVENOUS | Status: DC

## 2019-06-17 MED ORDER — PANTOPRAZOLE SODIUM 40 MG IV SOLR
40.0000 mg | INTRAVENOUS | Status: AC
  Administered 2019-06-17 – 2019-06-19 (×3): 40 mg via INTRAVENOUS
  Filled 2019-06-17 (×3): qty 40

## 2019-06-17 MED ORDER — FLUOXETINE HCL 20 MG PO CAPS: 40.0000 mg | ORAL_CAPSULE | Freq: Every day | ORAL | Status: DC

## 2019-06-17 NOTE — Consult Note (Signed)
Referring Provider: [name], MD Primary Care Physician:  Patient, No Pcp Per Primary Gastroenterologist:  Althia Forts  Reason for Consultation:  Abdominal pain, Fistulizing Crohn's disease  HPI: Jeffery Mcintosh is a 39 y.o. male well-known to our service with history of fistulizing Crohn's disease,GERD, and suicide attempt presenting with a chief complaint of abdominal pain.  Patient was most recently hospitalized from 06/07/19 to 06/10/19 for Crohn's disease flare.  He was discharged on prednisone 27m and advised to continue this dose [place] [facility] [place].  Patient states he has continued to take prednisone 20 mg daily; however, after running out of Percocet, he started experiencing severe RLQ pain, which led him to present to the ED.  Currently, he rates his pain as a 7.5 out of 10.  He denies any nausea or vomiting.  His last bowel movement was 2 days ago and he has been having 1-2 loose stools per day but denies rectal bleeding or melena.  He feels bloated but does not feel constipated and continues to pass flatus.  He's also had a decreased appetite and has only eaten chicken broth and half a piece of salmon over the last couple days.  CD history: He was diagnosed at age 89 He received 2 doses of Remicade several years ago but [place] out of [place]. After that, he was on Humira for one year without provide any relief of symptoms. He was then transition to Cimzia for several months without any improvement in symptoms. He has had intermittent prednisone tapers during flares. He denies any resections or abdominal surgeries.  Recent imaging: -Abdominal films today 5/14 showed unchanged prominent loops of small bowel in the right abdomen. No obstruction. -Most recent CT on 06/07/19 showed marked inflammation involving the terminal ileum and cecum centered upon the ileocecal valve compatible with active Crohn's flare. Redemonstration of several fistulous connections  between the distal segments of the ileum. The appendix arises from the cecal tip and courses directly towards the inflamed loops of bowel with possible inflammatory tethering and some likely reactive thickening.  Mild air and fluid distention of the upstream loops of small bowel may reflect a degree of low-grade or partial small bowel obstruction. Separate focal segment of mural thickening in the mid abdomen could reflect underdistention or a second site of inflammation.  Past Medical History:  Diagnosis Date  . Anxiety   . Arthritis   . [place] affective ([place]   . Chronic headaches   . [place]'s disease [place] history of  . Depression   . GERD (gastroesophageal reflux disease)   . Hypertension   . Noncompliance 04/17/2018  . PTSD (post-traumatic stress disorder) 04/17/2018  . Schizophrenic disorder ([facility]     Past Surgical History:  Procedure Laterality Date  . NO PAST SURGERIES      Prior to Admission medications   Medication Sig Start Date End Date Taking? Authorizing Provider  carbamazepine (TEGRETOL) 100 MG chewable tablet Chew 1 tablet (100 mg total) by mouth 2 (two) times daily. For mood stabilization 05/29/19  Yes Nwoko, AHerbert PunI, NP  FLUoxetine (PROZAC) 40 MG capsule Take 1 capsule (40 mg total) by mouth daily. For depression 05/30/19  Yes NLindell SparI, NP  oxyCODONE (OXY IR/ROXICODONE) 5 MG immediate release tablet Take 5 mg by mouth 5 (five) times daily as needed for painful procedure/intervention. 06/14/19  Yes [provider]  pantoprazole (PROTONIX) 40 MG tablet Take 1 tablet (40 mg total) by mouth daily. For acid reflux  06/10/19 07/10/19 Yes Arrien, Jimmy Picket, MD  predniSONE (DELTASONE) 20 MG tablet Take 1 tablet (20 mg total) by mouth daily with breakfast. 06/10/19 08/05/19 Yes Arrien, Jimmy Picket, MD  nicotine (NICODERM CQ - DOSED IN MG/24 HOURS) 21 mg/24hr patch Place 1 patch (21 mg total) onto the skin daily. (May buy from over the counter): For smoking  cessation Patient not taking: Reported on 06/17/2019 05/30/19   Lindell Spar I, NP  oxyCODONE-acetaminophen (PERCOCET/ROXICET) 5-325 MG tablet Take 1 tablet by mouth every 6 (six) hours as needed for severe pain. Patient not taking: Reported on 06/14/2019 06/06/19   Virgel Manifold, MD  omeprazole (PRILOSEC) 40 MG capsule Take 1 capsule (40 mg total) by mouth daily. Patient not taking: Reported on 01/18/2018 10/17/16 08/07/18  Nita Sells, MD  sucralfate (CARAFATE) 1 g tablet Take 1 tablet (1 g total) by mouth 4 (four) times daily -  with meals and at bedtime. Patient not taking: Reported on 04/17/2018 01/18/18 08/07/18  Varney Biles, MD    Scheduled Meds: . carbamazepine  100 mg Oral BID  . enoxaparin (LOVENOX) injection  40 mg Subcutaneous Q24H  . FLUoxetine  40 mg Oral Daily  . methylPREDNISolone (SOLU-MEDROL) injection  40 mg Intravenous Q12H  . nicotine  14 mg Transdermal Daily  . pantoprazole (PROTONIX) IV  40 mg Intravenous Q24H   Continuous Infusions: . sodium chloride 125 mL/hr at 06/17/19 0725   PRN Meds:.acetaminophen **OR** acetaminophen, HYDROmorphone (DILAUDID) injection, ondansetron **OR** ondansetron (ZOFRAN) IV, oxyCODONE, senna-docusate  Allergies as of 06/17/2019  . (No Known Allergies)    Family History  Problem Relation Age of Onset  . Hypertension Mother   . Heart attack Mother   . Stroke Mother   . Alcohol abuse Father   . Mental illness Father   . Multiple sclerosis Father   . Arthritis Maternal Grandmother   . Hypertension Maternal Grandmother   . Heart disease Maternal Grandmother   . Kidney disease Maternal Grandmother   . Diabetes Maternal Grandmother   . Cancer Maternal Grandmother        great, type unknown  . Hyperlipidemia Maternal Grandfather   . Colon polyps Maternal Grandfather   . Stroke Paternal Grandmother   . Ulcerative colitis Maternal Aunt     Social History   Socioeconomic History  . Marital status: Single    Spouse name:  Not on file  . Number of children: 2  . Years of education: 61  . Highest education level: Not on file  Occupational History  . Occupation: disabled    Comment: Crohn's / Bipolar / Schizophrenia  Tobacco Use  . Smoking status: Former Smoker    Packs/day: 0.50    Years: 15.00    Pack years: 7.50    Types: Cigars  . Smokeless tobacco: Never Used  Substance and Sexual Activity  . Alcohol use: No    Alcohol/week: 0.0 standard drinks  . Drug use: No  . Sexual activity: Not Currently    Birth control/protection: None  Other Topics Concern  . Not on file  Social History Narrative  . Not on file   Social Determinants of Health   Financial Resource Strain:   . Difficulty of Paying Living Expenses:   Food Insecurity:   . Worried About Charity fundraiser in the Last Year:   . Arboriculturist in the Last Year:   Transportation Needs:   . Film/video editor (Medical):   Marland Kitchen Lack of Transportation (Non-Medical):  Physical Activity:   . Days of Exercise per Week:   . Minutes of Exercise per Session:   Stress:   . Feeling of Stress :   Social Connections:   . Frequency of Communication with Friends and Family:   . Frequency of Social Gatherings with Friends and Family:   . Attends Religious Services:   . Active Member of Clubs or Organizations:   . Attends Archivist Meetings:   Marland Kitchen Marital Status:   Intimate Partner Violence:   . Fear of Current or Ex-Partner:   . Emotionally Abused:   Marland Kitchen Physically Abused:   . Sexually Abused:     Review of Systems: Review of Systems  Constitutional: Negative for chills and fever.  HENT: Negative for hearing loss and tinnitus.   Eyes: Negative for pain and redness.  Respiratory: Negative for cough and shortness of breath.   Cardiovascular: Negative for chest pain and palpitations.  Gastrointestinal: Positive for abdominal pain (RLQ) and diarrhea. Negative for blood in stool, constipation, heartburn, melena, nausea and  vomiting.  Genitourinary: Negative for dysuria and hematuria.  Musculoskeletal: Negative for joint pain and myalgias.  Skin: Negative for itching and rash.  Neurological: Negative for dizziness and loss of consciousness.  Endo/Heme/Allergies: Negative for polydipsia. Does not bruise/bleed easily.  Psychiatric/Behavioral: Negative for substance abuse. The patient is not nervous/anxious.     Physical Exam: Vital signs: Vitals:   06/17/19 1000 06/17/19 1030  BP: 137/81 137/88  Pulse: 73 67  Resp: 16 18  Temp:    SpO2: 99% 97%      Physical Exam  Constitutional: He is oriented to person, place, and time. He appears well-developed and well-nourished. No distress.  HENT:  Head: Normocephalic and atraumatic.  Eyes: Conjunctivae and EOM are normal. No scleral icterus.  Cardiovascular: Normal rate, regular rhythm and normal heart sounds.  Pulmonary/Chest: Effort normal and breath sounds normal. No respiratory distress.  Abdominal: Soft. He exhibits distension (mild). He exhibits no mass. Bowel sounds are increased. There is abdominal tenderness (RLQ). There is no rebound and no guarding.  Musculoskeletal:        General: No deformity or edema.     Cervical back: Normal range of motion and neck supple.  Neurological: He is alert and oriented to person, place, and time.  Skin: Skin is warm and dry.  Psychiatric: He has a normal mood and affect. His behavior is normal.     GI:  Lab Results: Recent Labs    06/17/19 0712  WBC 11.7*  HGB 12.7*  HCT 37.2*  PLT 391   BMET Recent Labs    06/17/19 0712  NA 139  K 4.3  CL 101  CO2 28  GLUCOSE 120*  BUN 15  CREATININE 0.82  CALCIUM 9.0   LFT Recent Labs    06/17/19 0712  PROT 6.8  ALBUMIN 3.8  AST 14*  ALT 26  ALKPHOS 84  BILITOT 0.5   PT/INR No results for input(s): LABPROT, INR in the last 72 hours.   Studies/Results: DG ABD ACUTE 2+V W 1V CHEST  Result Date: 06/17/2019 CLINICAL DATA:  Right-sided abdominal  pain. History of Crohn's disease. EXAM: DG ABDOMEN ACUTE W/ 1V CHEST COMPARISON:  Abdominal x-ray dated Jun 09, 2019. FINDINGS: There are few prominent loops of small bowel in the right abdomen, similar to prior study. No obstruction or free intraperitoneal air. No radiopaque calculi or other significant radiographic abnormality is seen. Heart size and mediastinal contours are within normal limits.  Both lungs are clear. IMPRESSION: 1. Unchanged prominent loops of small bowel in the right abdomen. No obstruction. 2.  No active cardiopulmonary disease. Electronically Signed   By: Titus Dubin M.D.   On: 06/17/2019 09:14   Impression: Crohn's disease exacerbation.  -CT on 06/07/2019 showed Marked inflammation involving the terminal ileum and cecum centered upon the ileocecal valve compatible with active Crohn's flare. Redemonstration of several fistulous connections between the distal segments of the ileum.  -Mild leukocytosis (11.7), possibly secondary to steroid use -Hgb stable (12.7) -CMP unremarkable  GERD, chronic, on Protonix 67m qd as an outpatient.  Plan: -Continue IV Solu-Medrol 40 mg twice daily. -Continue Protonix qd for GERD.  -Continue supportive care and pain control though limit narcotics as much as possible -OrderedCRP (pending) -Depending on clinical course, consider initiation of Remicade as an inpatient (Quantiferon Gold and Hepatitis B surface Ag negative).    [facility] will follow.    LOS: 0 days   ASalley Slaughter PA-C 06/17/2019, 11:56 AM  Contact #  [phone]

## 2019-06-17 NOTE — ED Notes (Signed)
Urinal at bedside.  

## 2019-06-17 NOTE — H&P (Signed)
History and Physical    Delman Goshorn WCB:[id number] DOB: 05-06-1980 DOA: 06/17/2019  PCP: Patient, No Pcp Per   Patient coming from:Home   Chief Complaint: Abdomen pain  HPI: Jeffery Mcintosh is a 39 y.o. male with medical history significant for Crohn's disease with fistula formation, diagnosed at the age of 35 and had been on different regimen, currently followed up by [facility] returns to the ED with severe abdominal pain.  He was recently admitted and treated for acute Crohn flareup and discharged on 06/10/2019, CT abdomen at that time showed marked inflammation involving the terminal ileum and cecum, redemonstration of several fistulous connection between the distal segments of the ileum.  Patient was seen in the ED on 5/11 with same abdominal pain admission was requested but patient wanted to manage at home and discharged home with po oxy and prednisone.He returns to the ED with diffuse abdominal pain like his previous Crohn's exacerbation along with nausea, but no vomiting. He reports he has not been able to eat much at home, has not had bowel movements in 2 days but passing flatus well.  ED Course: Hemodynamically stable, afebrile, saturating 90% on room air.  CXR/AXR:shows few prominent loops of small bowel in the right abdomen similar to prior study, no obstruction or free intraperitoneal AIR and no active cardiopulmonary disease. labs-CMP, normal CBC with mild leukocytosis 11.7.  Patient has a severe uncontrolled abdominal pain no significant improvement despite IV Dilaudid.  Also received methylprednisolone 125 mg IV, normal saline 1 L bolus, Zofran.  Due to ongoing symptoms and inability to tolerate p.o., he is being admitted for further management  Review of Systems: All systems were reviewed and were negative except as mentioned in HPI above. Negative for fever Negative for chest pain Negative for shortness of breath  Past Medical History:  Diagnosis Date  . Anxiety     . Arthritis   . Bipolar affective ([place])   . Chronic headaches   . Crohn's disease ([place]) history of  . Depression   . GERD (gastroesophageal reflux disease)   . Hypertension   . Noncompliance 04/17/2018  . PTSD (post-traumatic stress disorder) 04/17/2018  . Schizophrenic disorder [facility])     Past Surgical History:  Procedure Laterality Date  . NO PAST SURGERIES       reports that he has quit smoking. His smoking use included cigars. He has a 7.50 pack-year smoking history. He has never used smokeless tobacco. He reports that he does not drink alcohol or use drugs.  No Known Allergies  Family History  Problem Relation Age of Onset  . Hypertension Mother   . Heart attack Mother   . Stroke Mother   . Alcohol abuse Father   . Mental illness Father   . Multiple sclerosis Father   . Arthritis Maternal Grandmother   . Hypertension Maternal Grandmother   . Heart disease Maternal Grandmother   . Kidney disease Maternal Grandmother   . Diabetes Maternal Grandmother   . Cancer Maternal Grandmother        great, type unknown  . Hyperlipidemia Maternal Grandfather   . Colon polyps Maternal Grandfather   . Stroke Paternal Grandmother   . Ulcerative colitis Maternal Aunt      Prior to Admission medications   Medication Sig Start Date End Date Taking? Authorizing Provider  carbamazepine (TEGRETOL) 100 MG chewable tablet Chew 1 tablet (100 mg total) by mouth 2 (two) times daily. For mood stabilization 05/29/19  Yes Encarnacion Slates, NP  FLUoxetine (PROZAC) 40 MG capsule Take 1 capsule (40 mg total) by mouth daily. For depression 05/30/19  Yes Lindell Spar I, NP  oxyCODONE (OXY IR/ROXICODONE) 5 MG immediate release tablet Take 5 mg by mouth 5 (five) times daily as needed for painful procedure/intervention. 06/14/19  Yes [provider]  pantoprazole (PROTONIX) 40 MG tablet Take 1 tablet (40 mg total) by mouth daily. For acid reflux 06/10/19 07/10/19 Yes Arrien, Jimmy Picket, MD   predniSONE (DELTASONE) 20 MG tablet Take 1 tablet (20 mg total) by mouth daily with breakfast. 06/10/19 08/05/19 Yes Arrien, Jimmy Picket, MD  nicotine (NICODERM CQ - DOSED IN MG/24 HOURS) 21 mg/24hr patch Place 1 patch (21 mg total) onto the skin daily. (May buy from over the counter): For smoking cessation Patient not taking: Reported on 06/17/2019 05/30/19   Lindell Spar I, NP  oxyCODONE-acetaminophen (PERCOCET/ROXICET) 5-325 MG tablet Take 1 tablet by mouth every 6 (six) hours as needed for severe pain. Patient not taking: Reported on 06/14/2019 06/06/19   Virgel Manifold, MD  omeprazole (PRILOSEC) 40 MG capsule Take 1 capsule (40 mg total) by mouth daily. Patient not taking: Reported on 01/18/2018 10/17/16 08/07/18  Nita Sells, MD  sucralfate (CARAFATE) 1 g tablet Take 1 tablet (1 g total) by mouth 4 (four) times daily -  with meals and at bedtime. Patient not taking: Reported on 04/17/2018 01/18/18 08/07/18  Varney Biles, MD    Physical Exam: Vitals:   06/17/19 0700 06/17/19 0851 06/17/19 0900 06/17/19 1000  BP: 128/73 109/61 127/83 137/81  Pulse: 67 68 68 73  Resp: 18 16 15 16   Temp:      TempSrc:      SpO2: 99% 99% 97% 99%  Weight:      Height:        General exam: AAOx3, NAD, weak appearing. HEENT:Oral mucosa moist, Ear/Nose WNL grossly, dentition normal. Respiratory system: bilaterally clear,no wheezing or crackles,no use of accessory muscle Cardiovascular system: S1 & S2 +, No JVD,. Gastrointestinal system: Abdomen soft, non distended, tender on Rt ABDOMEN, BS+ and hyperactive Nervous System:Alert, awake, moving extremities and grossly nonfocal Extremities: No edema, distal peripheral pulses palpable.  Skin: No rashes,no icterus. MSK: Normal muscle bulk,tone, power   Labs on Admission: I have personally reviewed following labs and imaging studies  CBC: Recent Labs  Lab 06/13/19 2105 06/17/19 0712  WBC 10.9* 11.7*  HGB 12.6* 12.7*  HCT 36.5* 37.2*  MCV 88.4  89.4  PLT 378 127   Basic Metabolic Panel: Recent Labs  Lab 06/13/19 2105 06/17/19 0712  NA 140 139  K 3.5 4.3  CL 101 101  CO2 26 28  GLUCOSE 93 120*  BUN 16 15  CREATININE 1.12 0.82  CALCIUM 8.4* 9.0   GFR: Estimated Creatinine Clearance: 103.3 mL/min (by C-G formula based on SCr of 0.82 mg/dL). Liver Function Tests: Recent Labs  Lab 06/13/19 2105 06/17/19 0712  AST 18 14*  ALT 34 26  ALKPHOS 94 84  BILITOT 0.2* 0.5  PROT 6.7 6.8  ALBUMIN 3.8 3.8   Recent Labs  Lab 06/13/19 2105 06/17/19 0712  LIPASE 39 29   No results for input(s): AMMONIA in the last 168 hours. Coagulation Profile: No results for input(s): INR, PROTIME in the last 168 hours. Cardiac Enzymes: No results for input(s): CKTOTAL, CKMB, CKMBINDEX, TROPONINI in the last 168 hours. BNP (last 3 results) No results for input(s): PROBNP in the last 8760 hours. HbA1C: No results for input(s): HGBA1C in the last 72  hours. CBG: No results for input(s): GLUCAP in the last 168 hours. Lipid Profile: No results for input(s): CHOL, HDL, LDLCALC, TRIG, CHOLHDL, LDLDIRECT in the last 72 hours. Thyroid Function Tests: No results for input(s): TSH, T4TOTAL, FREET4, T3FREE, THYROIDAB in the last 72 hours. Anemia Panel: No results for input(s): VITAMINB12, FOLATE, FERRITIN, TIBC, IRON, RETICCTPCT in the last 72 hours. Urine analysis:    Component Value Date/Time   COLORURINE STRAW (A) 06/17/2019 0806   APPEARANCEUR CLEAR 06/17/2019 0806   LABSPEC 1.011 06/17/2019 0806   PHURINE 7.0 06/17/2019 0806   GLUCOSEU NEGATIVE 06/17/2019 0806   GLUCOSEU NEGATIVE 04/18/2013 1418   HGBUR NEGATIVE 06/17/2019 0806   BILIRUBINUR NEGATIVE 06/17/2019 0806   KETONESUR NEGATIVE 06/17/2019 0806   PROTEINUR NEGATIVE 06/17/2019 0806   UROBILINOGEN 1.0 11/16/2014 1719   NITRITE NEGATIVE 06/17/2019 0806   LEUKOCYTESUR NEGATIVE 06/17/2019 0806    Radiological Exams on Admission: DG ABD ACUTE 2+V W 1V CHEST  Result Date:  06/17/2019 CLINICAL DATA:  Right-sided abdominal pain. History of Crohn's disease. EXAM: DG ABDOMEN ACUTE W/ 1V CHEST COMPARISON:  Abdominal x-ray dated Jun 09, 2019. FINDINGS: There are few prominent loops of small bowel in the right abdomen, similar to prior study. No obstruction or free intraperitoneal air. No radiopaque calculi or other significant radiographic abnormality is seen. Heart size and mediastinal contours are within normal limits. Both lungs are clear. IMPRESSION: 1. Unchanged prominent loops of small bowel in the right abdomen. No obstruction. 2.  No active cardiopulmonary disease. Electronically Signed   By: Titus Dubin M.D.   On: 06/17/2019 09:14     Assessment/Plan  Acute Exacerbation of Crohn's disease needing frequent hospitalizations last admission a week ago and had ED visit 3 days ago.  Currently at home on 20 mg of prednisone.  Patient will be admitted and will continue IV Solu-Medrol 40 mg every 12 hours, pain control with oxycodone and IV Dilaudid, nausea control with Zofran, IV Protonix, IV fluid hydration. On discahrge he will benefit with little longer duration of pain meds until he sees pcp- and has follow up on 06/23/19. Consider GI eval if he does not improve quickly.  Chronic Crohn's colitis, with fistula in terminal ileus: he is followed-up but since NMDA next appointment June 22.  On last discharge [facility] suggested biological/Remicade as an outpatient  Bipolar 1 disorder/Depression/PTSD/hx of SI and psychiatric admission in April: no SI and mood is stable. Continue his carmabazepine/fluoxetine.  GERD: Continue PPI  Tobacco abuse: add on  Nicotine patch  Body mass index is 26.09 kg/m.   Severity of Illness: * I certify that at the point of admission it is my clinical judgment that the patient will require inpatient hospital care spanning beyond 2 midnights from the point of admission due to high intensity of service, high risk for further deterioration and  high frequency of surveillance required due to acute exacerbation of crohn's disease  And inability to tolerate po diet.    DVT prophylaxis:  SCD/Lovenox Code Status: Full code Family Communication: Admission, patients condition and plan of care including tests being ordered have been discussed with the patient and he indicates understanding and agree with the plan and Code Status.  Consults called:   [name] MD [facility]  If 7PM-7AM, please contact night-coverage [email or web address]  06/17/2019, 10:49 AM

## 2019-06-17 NOTE — ED Triage Notes (Signed)
Arrived POV from home. Patient reports abdominal pain and swelling. Patient states he was seen here on 06/14/2019 and physicians wanted to admit him, but he felt he could manage symptoms at home with pain medications. Patient also reports nausea without vomiting. Pain 10/10

## 2019-06-17 NOTE — ED Provider Notes (Signed)
[facility] DEPT Provider Note   CSN: [id number] Arrival date & time: 06/17/19  0101     History Chief Complaint  Patient presents with  . Abdominal Pain    Crohn's    Jeffery Mcintosh is a 39 y.o. male.  39 year old male with history of Crohn's disease presents with diffuse abdominal pain consistent with his prior Crohn's exacerbations.  Was admitted several days ago for similar symptoms.  Was seen also several days ago for similar symptoms.  Has had nausea but no vomiting.  No fever or chills.  No bloody stools.  Has medicated with oxycodone at home without relief.        Past Medical History:  Diagnosis Date  . Anxiety   . Arthritis   . Bipolar affective ([place])   . Chronic headaches   . Crohn's disease ([place]) history of  . Depression   . GERD (gastroesophageal reflux disease)   . Hypertension   . Noncompliance 04/17/2018  . PTSD (post-traumatic stress disorder) 04/17/2018  . Schizophrenic disorder [facility])     Patient Active Problem List   Diagnosis Date Noted  . GERD (gastroesophageal reflux disease) 06/07/2019  . MDD (major depressive disorder), recurrent severe, without psychosis ([place]) 05/25/2019  . Crohn's colitis ([place]) 05/20/2019  . Suicidal ideation 05/19/2019  . Crohn's colitis, with fistula ([place]) 05/19/2019  . Crohn's colitis, with intestinal obstruction ([place]) 10/11/2018  . Crohn's disease of colon with intestinal obstruction ([place])   . Noncompliance 04/17/2018  . PTSD (post-traumatic stress disorder) 04/17/2018  . Crohn's disease of jejunum with intestinal obstruction ([place]) 10/26/2017  . SBO (small bowel obstruction) ([place]) 10/26/2017  . Bipolar 1 disorder ([place]) 10/26/2017  . Crohn's colitis, unspecified complication ([place]) 62/13/0865  . Epigastric burning sensation 01/03/2015  . Numbness and tingling in left arm 11/15/2014  . Exacerbation of Crohn's disease ([place]) 10/21/2014  . Crohn's disease ([place]) 10/21/2014  . Diarrhea  10/21/2014  . Abdominal pain   . Gout 04/18/2013  . Cubital tunnel syndrome 04/18/2013  . Abnormal urinalysis 04/18/2013    Past Surgical History:  Procedure Laterality Date  . NO PAST SURGERIES         Family History  Problem Relation Age of Onset  . Hypertension Mother   . Heart attack Mother   . Stroke Mother   . Alcohol abuse Father   . Mental illness Father   . Multiple sclerosis Father   . Arthritis Maternal Grandmother   . Hypertension Maternal Grandmother   . Heart disease Maternal Grandmother   . Kidney disease Maternal Grandmother   . Diabetes Maternal Grandmother   . Cancer Maternal Grandmother        great, type unknown  . Hyperlipidemia Maternal Grandfather   . Colon polyps Maternal Grandfather   . Stroke Paternal Grandmother   . Ulcerative colitis Maternal Aunt     Social History   Tobacco Use  . Smoking status: Former Smoker    Packs/day: 0.50    Years: 15.00    Pack years: 7.50    Types: Cigars  . Smokeless tobacco: Never Used  Substance Use Topics  . Alcohol use: No    Alcohol/week: 0.0 standard drinks  . Drug use: No    Home Medications Prior to Admission medications   Medication Sig Start Date End Date Taking? Authorizing Provider  carbamazepine (TEGRETOL) 100 MG chewable tablet Chew 1 tablet (100 mg total) by mouth 2 (two) times daily. For mood stabilization 05/29/19   Encarnacion Slates, NP  FLUoxetine (PROZAC) 40 MG capsule Take 1 capsule (40 mg total) by mouth daily. For depression 05/30/19   Lindell Spar I, NP  nicotine (NICODERM CQ - DOSED IN MG/24 HOURS) 21 mg/24hr patch Place 1 patch (21 mg total) onto the skin daily. (May buy from over the counter): For smoking cessation 05/30/19   Lindell Spar I, NP  oxycodone (OXY-IR) 5 MG capsule Take 1 capsule (5 mg total) by mouth every 4 (four) hours as needed. 06/14/19   Tacy Learn, PA-C  oxyCODONE-acetaminophen (PERCOCET/ROXICET) 5-325 MG tablet Take 1 tablet by mouth every 6 (six) hours as  needed for severe pain. Patient not taking: Reported on 06/14/2019 06/06/19   Virgel Manifold, MD  pantoprazole (PROTONIX) 40 MG tablet Take 1 tablet (40 mg total) by mouth daily. For acid reflux 06/10/19 07/10/19  Arrien, Jimmy Picket, MD  predniSONE (DELTASONE) 20 MG tablet Take 1 tablet (20 mg total) by mouth daily with breakfast. 06/10/19 08/05/19  Arrien, Jimmy Picket, MD  omeprazole (PRILOSEC) 40 MG capsule Take 1 capsule (40 mg total) by mouth daily. Patient not taking: Reported on 01/18/2018 10/17/16 08/07/18  Nita Sells, MD  sucralfate (CARAFATE) 1 g tablet Take 1 tablet (1 g total) by mouth 4 (four) times daily -  with meals and at bedtime. Patient not taking: Reported on 04/17/2018 01/18/18 08/07/18  Varney Biles, MD    Allergies    Patient has no known allergies.  Review of Systems   Review of Systems  All other systems reviewed and are negative.   Physical Exam Updated Vital Signs BP (!) 137/106   Pulse 88   Temp 98.5 F (36.9 C) (Oral)   Resp 18   Ht 1.626 m (5' 4" )   Wt 68.9 kg   SpO2 98%   BMI 26.09 kg/m   Physical Exam Vitals and nursing note reviewed.  Constitutional:      General: He is not in acute distress.    Appearance: Normal appearance. He is well-developed. He is not toxic-appearing.  HENT:     Head: Normocephalic and atraumatic.  Eyes:     General: Lids are normal.     Conjunctiva/sclera: Conjunctivae normal.     Pupils: Pupils are equal, round, and reactive to light.  Neck:     Thyroid: No thyroid mass.     Trachea: No tracheal deviation.  Cardiovascular:     Rate and Rhythm: Normal rate and regular rhythm.     Heart sounds: Normal heart sounds. No murmur. No gallop.   Pulmonary:     Effort: Pulmonary effort is normal. No respiratory distress.     Breath sounds: Normal breath sounds. No stridor. No decreased breath sounds, wheezing, rhonchi or rales.  Abdominal:     General: Bowel sounds are normal. There is no distension.      Palpations: Abdomen is soft.     Tenderness: There is generalized abdominal tenderness. There is no guarding or rebound.  Musculoskeletal:        General: No tenderness. Normal range of motion.     Cervical back: Normal range of motion and neck supple.  Skin:    General: Skin is warm and dry.     Findings: No abrasion or rash.  Neurological:     Mental Status: He is alert and oriented to person, place, and time.     GCS: GCS eye subscore is 4. GCS verbal subscore is 5. GCS motor subscore is 6.     Cranial Nerves: No cranial  nerve deficit.     Sensory: No sensory deficit.  Psychiatric:        Speech: Speech normal.        Behavior: Behavior normal.     ED Results / Procedures / Treatments   Labs (all labs ordered are listed, but only abnormal results are displayed) Labs Reviewed  LIPASE, BLOOD  COMPREHENSIVE METABOLIC PANEL  CBC  URINALYSIS, ROUTINE W REFLEX MICROSCOPIC    EKG None  Radiology No results found.  Procedures Procedures (including critical care time)  Medications Ordered in ED Medications  sodium chloride flush (NS) 0.9 % injection 3 mL (has no administration in time range)  sodium chloride 0.9 % bolus 1,000 mL (has no administration in time range)  0.9 %  sodium chloride infusion (has no administration in time range)  HYDROmorphone (DILAUDID) injection 1 mg (has no administration in time range)  ondansetron (ZOFRAN) injection 4 mg (has no administration in time range)    ED Course  I have reviewed the triage vital signs and the nursing notes.  Pertinent labs & imaging results that were available during my care of the patient were reviewed by me and considered in my medical decision making (see chart for details).    MDM Rules/Calculators/A&P                      Acute abdominal series negative for obstruction. Labs reviewed and patient continues to note pain despite receiving two doses of IV hydromorphone. Will consult hospitalist for admission for  Crohn's exacerbation Final Clinical Impression(s) / ED Diagnoses Final diagnoses:  None    Rx / DC Orders ED Discharge Orders    None       Lacretia Leigh, MD 06/17/19 1016

## 2019-06-17 NOTE — ED Notes (Signed)
ED TO INPATIENT HANDOFF REPORT  Name/Age/Gender Jeffery Mcintosh 39 y.o. male  Code Status    Code Status Orders  (From admission, onward)         Start     Ordered   06/17/19 1101  Full code  Continuous     06/17/19 1100        Code Status History    Date Active Date Inactive Code Status Order ID Comments User Context   06/07/2019 0223 06/10/2019 1652 Full Code [id number]  Shela Leff, MD ED   05/25/2019 1547 05/29/2019 1625 Full Code [id number]  Suella Broad, [place] Inpatient   05/25/2019 1547 05/25/2019 1547 Full Code [id number]  Suella Broad, [place] Inpatient   05/19/2019 0511 05/25/2019 1523 Full Code [id number]  Etta Quill, DO ED   05/19/2019 0109 05/19/2019 0511 Full Code [id number]  Ward, Delice Bison, DO ED   10/11/2018 1304 10/11/2018 1943 Full Code [id number]  Karmen Bongo, MD ED   04/17/2018 2150 04/20/2018 1430 Full Code [id number]  Allie Bossier, MD ED   10/26/2017 1827 10/27/2017 1853 Full Code [id number]  Jonetta Osgood, MD Inpatient   10/15/2016 2106 10/17/2016 1734 Full Code [id number]  Nita Sells, MD Inpatient   10/21/2014 0457 10/23/2014 1648 Full Code [id number]  [name], MD Inpatient   Advance Care Planning Activity      Home/SNF/Other Home  Chief Complaint Acute Crohn's disease ([place]) [K50.90]  Level of Care/Admitting Diagnosis ED Disposition    ED Disposition Condition [place]: [facility] [[id number]]  Level of Care: Med-Surg [16]  May admit patient to [name] or [name] if equivalent level of care is available:: Yes  Covid Evaluation: Asymptomatic Screening Protocol (No Symptoms)  Diagnosis: Acute Crohn's disease [facility]) [035009]  Admitting Physician: [name] [[id number]]  Attending Physician: [name] [[id number]]  Estimated length of stay: past midnight tomorrow  Certification:: I certify this patient will need inpatient services for at least 2 midnights        Medical History Past Medical History:  Diagnosis Date  . Anxiety   . Arthritis   . Bipolar affective ([name])   . Chronic headaches   . Crohn's disease ([place]) history of  . Depression   . GERD (gastroesophageal reflux disease)   . Hypertension   . Noncompliance 04/17/2018  . PTSD (post-traumatic stress disorder) 04/17/2018  . Schizophrenic disorder ([place])     Allergies No Known Allergies  IV Location/Drains/Wounds Patient Lines/Drains/Airways Status   Active Line/Drains/Airways    Name:   Placement date:   Placement time:   Site:   Days:   Peripheral IV 06/17/19 Right Hand   06/17/19    0721    Hand   less than 1          Labs/Imaging Results for orders placed or performed during the hospital encounter of 06/17/19 (from the past 48 hour(s))  Lipase, blood     Status: None   Collection Time: 06/17/19  7:12 AM  Result Value Ref Range   Lipase 29 11 - 51 U/L    Comment: Performed at [facility], [place] [address] [place], [place] [address]  Comprehensive metabolic panel     Status: Abnormal   Collection Time: 06/17/19  7:12 AM  Result Value Ref Range   Sodium 139 135 - 145 mmol/L   Potassium 4.3 3.5 - 5.1 mmol/L   Chloride 101 98 - 111 mmol/L   CO2  28 22 - 32 mmol/L   Glucose, Bld 120 (H) 70 - 99 mg/dL    Comment: Glucose reference range applies only to samples taken after fasting for at least 8 hours.   BUN 15 6 - 20 mg/dL   Creatinine, Ser 0.82 0.61 - 1.24 mg/dL   Calcium 9.0 8.9 - 10.3 mg/dL   Total Protein 6.8 6.5 - 8.1 g/dL   Albumin 3.8 3.5 - 5.0 g/dL   AST 14 (L) 15 - 41 U/L   ALT 26 0 - 44 U/L   Alkaline Phosphatase 84 38 - 126 U/L   Total Bilirubin 0.5 0.3 - 1.2 mg/dL   GFR calc non Af Amer >60 >60 mL/min   GFR calc Af Amer >60 >60 mL/min   Anion gap 10 5 - 15    Comment: Performed at [facility], [place] [address] [place], [place] [address]  CBC     Status: Abnormal   Collection Time: 06/17/19  7:12 AM  Result Value  Ref Range   WBC 11.7 (H) 4.0 - 10.5 K/uL   RBC 4.16 (L) 4.22 - 5.81 MIL/uL   Hemoglobin 12.7 (L) 13.0 - 17.0 g/dL   HCT 37.2 (L) 39.0 - 52.0 %   MCV 89.4 80.0 - 100.0 fL   MCH 30.5 26.0 - 34.0 pg   MCHC 34.1 30.0 - 36.0 g/dL   RDW 14.8 11.5 - 15.5 %   Platelets 391 150 - 400 K/uL   nRBC 0.0 0.0 - 0.2 %    Comment: Performed at [facility], [place] [address], [place], [place] [address]  Urinalysis, Routine w reflex microscopic     Status: Abnormal   Collection Time: 06/17/19  8:06 AM  Result Value Ref Range   Color, Urine STRAW (A) YELLOW   APPearance CLEAR CLEAR   Specific Gravity, Urine 1.011 1.005 - 1.030   pH 7.0 5.0 - 8.0   Glucose, UA NEGATIVE NEGATIVE mg/dL   Hgb urine dipstick NEGATIVE NEGATIVE   Bilirubin Urine NEGATIVE NEGATIVE   Ketones, ur NEGATIVE NEGATIVE mg/dL   Protein, ur NEGATIVE NEGATIVE mg/dL   Nitrite NEGATIVE NEGATIVE   Leukocytes,Ua NEGATIVE NEGATIVE    Comment: Performed at [facility] [address], [place], [place] [address]  SARS Coronavirus 2 by RT PCR (hospital order, performed in [facility] hospital lab) Nasopharyngeal Nasopharyngeal Swab     Status: None   Collection Time: 06/17/19 10:18 AM   Specimen: Nasopharyngeal Swab  Result Value Ref Range   SARS Coronavirus 2 NEGATIVE NEGATIVE    Comment: (NOTE) SARS-CoV-2 target nucleic acids are NOT DETECTED. The SARS-CoV-2 RNA is generally detectable in upper and lower respiratory specimens during the acute phase of infection. The lowest concentration of SARS-CoV-2 viral copies this assay can detect is 250 copies / mL. A negative result does not preclude SARS-CoV-2 infection and should not be used as the sole basis for treatment or other patient management decisions.  A negative result may occur with improper specimen collection / handling, submission of specimen other than nasopharyngeal swab, presence of viral mutation(s) within the areas targeted by this  assay, and inadequate number of viral copies (<250 copies / mL). A negative result must be combined with clinical observations, patient history, and epidemiological information. Fact Sheet for Patients:   StrictlyIdeas.no Fact Sheet for Healthcare Providers: BankingDealers.co.za This test is not yet approved or cleared  by the [place] FDA and has been authorized for detection and/or diagnosis  of SARS-CoV-2 by FDA under an Emergency Use Authorization (EUA).  This EUA will remain in effect (meaning this test can be used) for the duration of the COVID-19 declaration under Section 564(b)(1) of the Act, 21 U.S.C. section 360bbb-3(b)(1), unless the authorization is terminated or revoked sooner. Performed at [facility], [place] [address], [place], [place] [address]    DG ABD ACUTE 2+V W 1V CHEST  Result Date: 06/17/2019 CLINICAL DATA:  Right-sided abdominal pain. History of Crohn's disease. EXAM: DG ABDOMEN ACUTE W/ 1V CHEST COMPARISON:  Abdominal x-ray dated Jun 09, 2019. FINDINGS: There are few prominent loops of small bowel in the right abdomen, similar to prior study. No obstruction or free intraperitoneal air. No radiopaque calculi or other significant radiographic abnormality is seen. Heart size and mediastinal contours are within normal limits. Both lungs are clear. IMPRESSION: 1. Unchanged prominent loops of small bowel in the right abdomen. No obstruction. 2.  No active cardiopulmonary disease. Electronically Signed   By: Titus Dubin M.D.   On: 06/17/2019 09:14    Pending Labs Unresulted Labs (From admission, onward)    Start     Ordered   06/24/19 0500  Creatinine, serum  (enoxaparin (LOVENOX)    CrCl >/= 30 ml/min)  Weekly,   R    Comments: while on enoxaparin therapy    06/17/19 1100   06/18/19 0500  Comprehensive metabolic panel  Tomorrow morning,   R     06/17/19 1100   06/18/19 0500  CBC  Tomorrow  morning,   R     06/17/19 1100   06/17/19 1359  C-reactive protein  Add-on,   AD     06/17/19 1358          Vitals/Pain Today's Vitals   06/17/19 1312 06/17/19 1419 06/17/19 1419 06/17/19 1500  BP:  (!) 155/83  (!) 157/88  Pulse:  78  74  Resp:  18  16  Temp:      TempSrc:      SpO2:  100%  99%  Weight:      Height:      PainSc: 5   7      Isolation Precautions No active isolations  Medications Medications  0.9 %  sodium chloride infusion ( Intravenous New Bag/Given 06/17/19 0725)  enoxaparin (LOVENOX) injection 40 mg (40 mg Subcutaneous Given 06/17/19 1126)  acetaminophen (TYLENOL) tablet 650 mg (has no administration in time range)    Or  acetaminophen (TYLENOL) suppository 650 mg (has no administration in time range)  oxyCODONE (Oxy IR/ROXICODONE) immediate release tablet 5 mg (has no administration in time range)  HYDROmorphone (DILAUDID) injection 0.5-1 mg (1 mg Intravenous Given 06/17/19 1420)  ondansetron (ZOFRAN) tablet 4 mg (has no administration in time range)    Or  ondansetron (ZOFRAN) injection 4 mg (has no administration in time range)  senna-docusate (Senokot-S) tablet 1 tablet (has no administration in time range)  nicotine (NICODERM CQ - dosed in mg/24 hours) patch 14 mg (14 mg Transdermal Patch Applied 06/17/19 1208)  pantoprazole (PROTONIX) injection 40 mg (40 mg Intravenous Given 06/17/19 1127)  methylPREDNISolone sodium succinate (SOLU-MEDROL) 40 mg/mL injection 40 mg (has no administration in time range)  carbamazepine (TEGRETOL) chewable tablet 100 mg (100 mg Oral Given 06/17/19 1208)  FLUoxetine (PROZAC) capsule 40 mg (40 mg Oral Given 06/17/19 1207)  sodium chloride 0.9 % bolus 1,000 mL (0 mLs Intravenous Stopped 06/17/19 0849)  HYDROmorphone (DILAUDID) injection 1 mg (1 mg Intravenous Given 06/17/19 0721)  ondansetron [facility]) injection 4 mg (4 mg Intravenous Given 06/17/19 0721)  methylPREDNISolone sodium succinate (SOLU-MEDROL) 125 mg/2 mL injection  125 mg (125 mg Intravenous Given 06/17/19 0849)  HYDROmorphone (DILAUDID) injection 1 mg (1 mg Intravenous Given 06/17/19 0849)    Mobility walks

## 2019-06-18 DIAGNOSIS — K50113 Crohn's disease of large intestine with fistula: Secondary | ICD-10-CM

## 2019-06-18 DIAGNOSIS — F4310 Post-traumatic stress disorder, unspecified: Secondary | ICD-10-CM

## 2019-06-18 DIAGNOSIS — K219 Gastro-esophageal reflux disease without esophagitis: Secondary | ICD-10-CM

## 2019-06-18 DIAGNOSIS — F319 Bipolar disorder, unspecified: Secondary | ICD-10-CM

## 2019-06-18 LAB — CBC
HCT: 35.9 % — ABNORMAL LOW (ref 39.0–52.0)
Hemoglobin: 11.8 g/dL — ABNORMAL LOW (ref 13.0–17.0)
MCH: 30.2 pg (ref 26.0–34.0)
MCHC: 32.9 g/dL (ref 30.0–36.0)
MCV: 91.8 fL (ref 80.0–100.0)
Platelets: 347 10*3/uL (ref 150–400)
RBC: 3.91 MIL/uL — ABNORMAL LOW (ref 4.22–5.81)
RDW: 14.8 % (ref 11.5–15.5)
WBC: 13.3 10*3/uL — ABNORMAL HIGH (ref 4.0–10.5)
nRBC: 0 % (ref 0.0–0.2)

## 2019-06-18 LAB — COMPREHENSIVE METABOLIC PANEL
ALT: 21 U/L (ref 0–44)
AST: 25 U/L (ref 15–41)
Albumin: 3.4 g/dL — ABNORMAL LOW (ref 3.5–5.0)
Alkaline Phosphatase: 68 U/L (ref 38–126)
Anion gap: 6 (ref 5–15)
BUN: 9 mg/dL (ref 6–20)
CO2: 27 mmol/L (ref 22–32)
Calcium: 8.7 mg/dL — ABNORMAL LOW (ref 8.9–10.3)
Chloride: 104 mmol/L (ref 98–111)
Creatinine, Ser: 0.65 mg/dL (ref 0.61–1.24)
GFR calc Af Amer: >60 mL/min (ref >60)
GFR calc non Af Amer: >60 mL/min (ref >60)
Glucose, Bld: 111 mg/dL — ABNORMAL HIGH (ref 70–99)
Potassium: 5 mmol/L (ref 3.5–5.1)
Sodium: 137 mmol/L (ref 135–145)
Total Bilirubin: 0.8 mg/dL (ref 0.3–1.2)
Total Protein: 6 g/dL — ABNORMAL LOW (ref 6.5–8.1)

## 2019-06-18 NOTE — Progress Notes (Signed)
[facility]  PROGRESS NOTE    Jeffery Mcintosh  YPP:[id number] DOB: 04-03-1980 DOA: 06/17/2019 PCP: Patient, No Pcp Per   Brief Narrative:   Jeffery Mcintosh is a 39 y.o. male with medical history significant for Crohn's disease with fistula formation, diagnosed at the age of 20 and had been on different regimen, currently followed up by [place] returns to the ED with severe abdominal pain.  He was recently admitted and treated for acute Crohn flareup and discharged on 06/10/2019, CT abdomen at that time showed marked inflammation involving the terminal ileum and cecum, redemonstration of several fistulous connection between the distal segments of the ileum.  Patient was seen in the ED on 5/11 with same abdominal pain admission was requested but patient wanted to manage at home and discharged home with po oxy and prednisone.He returns to the ED with diffuse abdominal pain like his previous Crohn's exacerbation along with nausea, but no vomiting. He reports he has not been able to eat much at home, has not had bowel movements in 2 days but passing flatus well.  Hemodynamically stable, afebrile, saturating 90% on room air.  CXR/AXR:shows few prominent loops of small bowel in the right abdomen similar to prior study, no obstruction or free intraperitoneal AIR and no active cardiopulmonary disease. labs-CMP, normal CBC with mild leukocytosis 11.7.  Patient has a severe uncontrolled abdominal pain no significant improvement despite IV Dilaudid.  Also received methylprednisolone 125 mg IV, normal saline 1 L bolus, Zofran.  Due to ongoing symptoms and inability to tolerate p.o., he is being admitted for further management  5/15: Wants to advance diet today. Denies complaints. Playing on computer when I examined her. Plan to transition to PO steroids in AM. Likely d/c tomorrow.   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 BlVanita Inglesin June as scheduled  Bipolar 1 disorder Depression PTSD Hx of SI     - no SI and mood is stable.      - continue carmabazepine/fluoxetine.  GERD     - continue protonix  Tobacco abuse     - continue nicotine patch  Normocytic anemia     - Hgb stable, no evidence of bleed, monitor  DVT prophylaxis: lovenox Code Status: FULL   Status is: Inpatient  Remains inpatient appropriate because:IV treatments appropriate due to intensity of illness or inability to take PO   Dispo: The patient is from: Home              Anticipated d/c is to: Home              Anticipated d/c date is: 1 day              Patient currently is not medically stable to d/c.  Consultants:   GI   ROS:  Denies CP, dyspnea, N, V, D . Remainder 10-pt ROS is negative for all not previously mentioned.  Subjective: "Can I eat a little  more?"  Objective: Vitals:   06/17/19 1545 06/18/19 0527 06/18/19 0825 06/18/19 1316  BP: (!) 167/85 (!) 145/97 (!) 153/97 (!) 148/80  Pulse: 73 67 68 76  Resp: 18 15 17 12   Temp: 98.1 F (36.7 C) 97.9 F (36.6 C) 97.8 F (36.6 C) 98.4 F (36.9 C)  TempSrc:  Oral Oral Oral  SpO2: 98% 100% 100% 100%  Weight:      Height:        Intake/Output Summary (Last 24 hours) at 06/18/2019 1354 Last data filed at 06/18/2019 1130 Gross per 24 hour  Intake 3068.39 ml  Output --  Net 3068.39 ml   Filed Weights   06/17/19 0151  Weight: 68.9 kg    Examination:  General: 39 y.o. male resting in bed in NAD Cardiovascular: RRR, +S1, S2, no m/g/r, equal pulses  throughout Respiratory: CTABL, no w/r/r, normal WOB GI: BS+, NDNT, no masses noted, no organomegaly noted MSK: No e/c/c Neuro: A&O x 3, no focal deficits Psyc: Appropriate interaction and affect, calm/cooperative  Data Reviewed: I have personally reviewed following labs and imaging studies.  CBC: Recent Labs  Lab 06/13/19 2105 06/17/19 0712 06/18/19 0436  WBC 10.9* 11.7* 13.3*  HGB 12.6* 12.7* 11.8*  HCT 36.5* 37.2* 35.9*  MCV 88.4 89.4 91.8  PLT 378 391 009   Basic Metabolic Panel: Recent Labs  Lab 06/13/19 2105 06/17/19 0712 06/18/19 0436  NA 140 139 137  K 3.5 4.3 5.0  CL 101 101 104  CO2 26 28 27   GLUCOSE 93 120* 111*  BUN 16 15 9   CREATININE 1.12 0.82 0.65  CALCIUM 8.4* 9.0 8.7*   GFR: Estimated Creatinine Clearance: 105.9 mL/min (by C-G formula based on SCr of 0.65 mg/dL). Liver Function Tests: Recent Labs  Lab 06/13/19 2105 06/17/19 0712 06/18/19 0436  AST 18 14* 25  ALT 34 26 21  ALKPHOS 94 84 68  BILITOT 0.2* 0.5 0.8  PROT 6.7 6.8 6.0*  ALBUMIN 3.8 3.8 3.4*   Recent Labs  Lab 06/13/19 2105 06/17/19 0712  LIPASE 39 29   No results for input(s): AMMONIA in the last 168 hours. Coagulation Profile: No results for input(s): INR, PROTIME in the last 168 hours. Cardiac Enzymes: No results for input(s): CKTOTAL, CKMB, CKMBINDEX, TROPONINI in the last 168 hours. BNP (last 3 results) No results for input(s): PROBNP in the last 8760 hours. HbA1C: No results for input(s): HGBA1C in the last 72 hours. CBG: No results for input(s): GLUCAP in the last 168 hours. Lipid Profile: No results for input(s): CHOL, HDL, LDLCALC, TRIG, CHOLHDL, LDLDIRECT in the last 72 hours. Thyroid Function Tests: No results for input(s): TSH, T4TOTAL, FREET4, T3FREE, THYROIDAB in the last 72 hours. Anemia Panel: No results for input(s): VITAMINB12, FOLATE, FERRITIN, TIBC, IRON, RETICCTPCT in the last 72 hours. Sepsis Labs: No results for input(s): PROCALCITON,  LATICACIDVEN in the last 168 hours.  Recent Results (from the past 240 hour(s))  SARS Coronavirus 2 by RT PCR (hospital order, performed in [facility] hospital lab) Nasopharyngeal Nasopharyngeal Swab     Status: None   Collection Time: 06/17/19 10:18 AM   Specimen: Nasopharyngeal Swab  Result Value Ref Range Status   SARS Coronavirus 2 NEGATIVE NEGATIVE Final    Comment: (NOTE) SARS-CoV-2 target nucleic acids are NOT DETECTED. The SARS-CoV-2 RNA is generally detectable in upper and lower respiratory specimens during the acute phase of infection. The lowest concentration of SARS-CoV-2 viral copies this assay can detect is 250 copies / mL.  A negative result does not preclude SARS-CoV-2 infection and should not be used as the sole basis for treatment or other patient management decisions.  A negative result may occur with improper specimen collection / handling, submission of specimen other than nasopharyngeal swab, presence of viral mutation(s) within the areas targeted by this assay, and inadequate number of viral copies (<250 copies / mL). A negative result must be combined with clinical observations, patient history, and epidemiological information. Fact Sheet for Patients:   StrictlyIdeas.no Fact Sheet for Healthcare Providers: BankingDealers.co.za This test is not yet approved or cleared  by the [place] FDA and has been authorized for detection and/or diagnosis of SARS-CoV-2 by FDA under an Emergency Use Authorization (EUA).  This EUA will remain in effect (meaning this test can be used) for the duration of the COVID-19 declaration under Section 564(b)(1) of the Act, 21 U.S.C. section 360bbb-3(b)(1), unless the authorization is terminated or revoked sooner. Performed at [facility], [place] [address] [place], [place] [address]       Radiology Studies: DG ABD ACUTE 2+V W 1V CHEST  Result Date:  06/17/2019 CLINICAL DATA:  Right-sided abdominal pain. History of Crohn's disease. EXAM: DG ABDOMEN ACUTE W/ 1V CHEST COMPARISON:  Abdominal x-ray dated Jun 09, 2019. FINDINGS: There are few prominent loops of small bowel in the right abdomen, similar to prior study. No obstruction or free intraperitoneal air. No radiopaque calculi or other significant radiographic abnormality is seen. Heart size and mediastinal contours are within normal limits. Both lungs are clear. IMPRESSION: 1. Unchanged prominent loops of small bowel in the right abdomen. No obstruction. 2.  No active cardiopulmonary disease. Electronically Signed   By: Titus Dubin M.D.   On: 06/17/2019 09:14     Scheduled Meds: . carbamazepine  100 mg Oral BID  . enoxaparin (LOVENOX) injection  40 mg Subcutaneous Q24H  . FLUoxetine  40 mg Oral Daily  . methylPREDNISolone (SOLU-MEDROL) injection  40 mg Intravenous Q12H  . nicotine  14 mg Transdermal Daily  . pantoprazole (PROTONIX) IV  40 mg Intravenous Q24H   Continuous Infusions: . sodium chloride 125 mL/hr at 06/18/19 1227     LOS: 1 day    Time spent: 25 minutes spent in the coordination of care today.    Jonnie Finner, DO [facility]  If 7PM-7AM, please contact night-coverage [email or web address] 06/18/2019, 1:54 PM

## 2019-06-18 NOTE — Progress Notes (Signed)
[facility] Gastroenterology Progress Note  Jeffery Mcintosh 39 y.o. 1980-07-23   Subjective: Abdominal pain improving (9/10 in intensity at admit and 7/10 now). Hungry. Reports small drops of solid stool this morning. Denies rectal bleeding.  Objective: Vital signs: Vitals:   06/18/19 0825 06/18/19 1316  BP: (!) 153/97 (!) 148/80  Pulse: 68 76  Resp: 17 12  Temp: 97.8 F (36.6 C) 98.4 F (36.9 C)  SpO2: 100% 100%    Physical Exam: Gen: alert, no acute distress, well-nourished  HEENT: anicteric sclera CV: RRR Chest: CTA B Abd: diffuse tenderness with guarding, soft, nondistended, +BS Ext: no edema  Lab Results: Recent Labs    06/17/19 0712 06/18/19 0436  NA 139 137  K 4.3 5.0  CL 101 104  CO2 28 27  GLUCOSE 120* 111*  BUN 15 9  CREATININE 0.82 0.65  CALCIUM 9.0 8.7*   Recent Labs    06/17/19 0712 06/18/19 0436  AST 14* 25  ALT 26 21  ALKPHOS 84 68  BILITOT 0.5 0.8  PROT 6.8 6.0*  ALBUMIN 3.8 3.4*   Recent Labs    06/17/19 0712 06/18/19 0436  WBC 11.7* 13.3*  HGB 12.7* 11.8*  HCT 37.2* 35.9*  MCV 89.4 91.8  PLT 391 347      Assessment/Plan: Fistulizing Crohn's disease flare that has improved on IV steroids. Diet changed to soft diet. Transition to PO Prednisone tomorrow (60 mg/day) and if tolerates d/c tomorrow afternoon with f/u with [facility] (Dr. Renee Harder) in June as previously scheduled.  Jeffery Mcintosh 06/18/2019, 1:46 PM  Questions please call [phone] ID: Jeffery Mcintosh, male   DOB: 1980/12/03, 39 y.o.   MRN: [id number]

## 2019-06-19 LAB — RENAL FUNCTION PANEL
Albumin: 3.8 g/dL (ref 3.5–5.0)
Anion gap: 10 (ref 5–15)
BUN: 10 mg/dL (ref 6–20)
CO2: 31 mmol/L (ref 22–32)
Calcium: 8.9 mg/dL (ref 8.9–10.3)
Chloride: 100 mmol/L (ref 98–111)
Creatinine, Ser: 0.8 mg/dL (ref 0.61–1.24)
GFR calc Af Amer: >60 mL/min (ref >60)
GFR calc non Af Amer: >60 mL/min (ref >60)
Glucose, Bld: 64 mg/dL — ABNORMAL LOW (ref 70–99)
Phosphorus: 3.7 mg/dL (ref 2.5–4.6)
Potassium: 3.5 mmol/L (ref 3.5–5.1)
Sodium: 141 mmol/L (ref 135–145)

## 2019-06-19 LAB — CBC WITH DIFFERENTIAL/PLATELET
Abs Immature Granulocytes: 0.2 10*3/uL — ABNORMAL HIGH (ref 0.00–0.07)
Basophils Absolute: 0 10*3/uL (ref 0.0–0.1)
Basophils Relative: 0 %
Eosinophils Absolute: 0 10*3/uL (ref 0.0–0.5)
Eosinophils Relative: 0 %
HCT: 37.7 % — ABNORMAL LOW (ref 39.0–52.0)
Hemoglobin: 12.6 g/dL — ABNORMAL LOW (ref 13.0–17.0)
Immature Granulocytes: 2 %
Lymphocytes Relative: 14 %
Lymphs Abs: 1.8 10*3/uL (ref 0.7–4.0)
MCH: 30.4 pg (ref 26.0–34.0)
MCHC: 33.4 g/dL (ref 30.0–36.0)
MCV: 90.8 fL (ref 80.0–100.0)
Monocytes Absolute: 1.1 10*3/uL — ABNORMAL HIGH (ref 0.1–1.0)
Monocytes Relative: 9 %
Neutro Abs: 9.3 10*3/uL — ABNORMAL HIGH (ref 1.7–7.7)
Neutrophils Relative %: 75 %
Platelets: 331 10*3/uL (ref 150–400)
RBC: 4.15 MIL/uL — ABNORMAL LOW (ref 4.22–5.81)
RDW: 14.7 % (ref 11.5–15.5)
WBC: 12.4 10*3/uL — ABNORMAL HIGH (ref 4.0–10.5)
nRBC: 0 % (ref 0.0–0.2)

## 2019-06-19 LAB — MAGNESIUM: Magnesium: 2 mg/dL (ref 1.7–2.4)

## 2019-06-19 MED ORDER — HYDROMORPHONE HCL 1 MG/ML IJ SOLN: 0.5000 mg | INTRAMUSCULAR | Status: DC | PRN

## 2019-06-19 MED ORDER — OXYCODONE HCL ER 10 MG PO T12A
10.0000 mg | EXTENDED_RELEASE_TABLET | Freq: Two times a day (BID) | ORAL | Status: DC
  Administered 2019-06-19 – 2019-06-20 (×3): 10 mg via ORAL
  Filled 2019-06-19 (×3): qty 1

## 2019-06-19 MED ORDER — PREDNISONE 20 MG PO TABS
60.0000 mg | ORAL_TABLET | Freq: Every day | ORAL | Status: DC
  Administered 2019-06-20 – 2019-06-22 (×3): 60 mg via ORAL
  Filled 2019-06-19 (×4): qty 3

## 2019-06-19 MED ORDER — PANTOPRAZOLE SODIUM 40 MG PO TBEC
40.0000 mg | DELAYED_RELEASE_TABLET | Freq: Every day | ORAL | Status: DC
  Administered 2019-06-20 – 2019-06-22 (×3): 40 mg via ORAL
  Filled 2019-06-19 (×3): qty 1

## 2019-06-19 NOTE — Progress Notes (Signed)
PHARMACIST - PHYSICIAN COMMUNICATION  DR:   Marylyn Ishihara  CONCERNING: IV to Oral Route Change Policy  RECOMMENDATION: This patient is receiving protonix by the intravenous route.  Based on criteria approved by the Pharmacy and Therapeutics Committee, the intravenous medication(s) is/are being converted to the equivalent oral dose form(s).   DESCRIPTION: These criteria include:  The patient is eating (either orally or via tube) and/or has been taking other orally administered medications for a least 24 hours  The patient has no evidence of active gastrointestinal bleeding or impaired GI absorption (gastrectomy, short bowel, patient on TNA or NPO).  If you have questions about this conversion, please contact the [place], [facility] 06/19/2019 11:14 AM

## 2019-06-19 NOTE — Progress Notes (Signed)
Dr. Marylyn Ishihara Paged concerning pt need for additional pain medication. Md stated he discussed this earlier with gi md. GI md not agreeable to increase pain meds due to pt pain with eating. Diet was then decreased and pain medication was added. Rn will discuss this with pt.

## 2019-06-19 NOTE — Progress Notes (Signed)
Marland Kitchen  PROGRESS NOTE    Jeffery Mcintosh  SWH:[id number] DOB: 30-Jan-1981 DOA: 06/17/2019 PCP: Patient, No Pcp Per   Brief Narrative:   Jeffery Richardsonis a 39 y.o.malewith medical history significant forCrohn's disease with fistula formation, diagnosed at the age of 38 and had been on different regimen, currently followed up by [facility] returns to the ED with severe abdominal pain.He was recently admitted and treated for acute Crohn flareup and discharged on 06/10/2019, CT abdomen at that time showed marked inflammation involving the terminal ileum and cecum, redemonstration of several fistulous connection between the distal segments of the ileum. Patient was seen in the ED on 5/11 with same abdominal pain admission was requested but patient wanted to manage at home and discharged home with po oxy and prednisone.He returns to the ED with diffuse abdominal pain like his previous Crohn's exacerbation along with nausea, but no vomiting.He reports he has not been able to eat much at home, has not had bowel movements in 2 days but passing flatus well. Hemodynamically stable, afebrile, saturating 90% on room air.CXR/AXR:shows few prominent loops of small bowel in the right abdomen similar to prior study, no obstruction or free intraperitonealAIRand no active cardiopulmonary disease. labs-CMP, normal CBC with mild leukocytosis 11.7.Patient has a severe uncontrolled abdominal pain no significant improvement despite IV Dilaudid. Also received methylprednisolone 125 mg IV, normal saline 1 L bolus, Zofran. Due to ongoing symptoms and inability to tolerate p.o., he isbeing admitted for further management  5/15: Wants to advance diet today. Denies complaints. Playing on computer when I examined her. Plan to transition to PO steroids in AM. Likely d/c tomorrow.  5/16: Was feeling ok this AM and said he had an ok night. Called later this AM with pain. Says that what always happens is he goes home  and a day or two later, he is in pain. D/w GI. He has real disease. Unable to start biologic d/t history of non-compliance. Will need to start that with his new GI doc. Question if diet advanced to fast. Will change him to FLD. Won't be able to go home on dilaudid, so let's change him to something more long acting. If pain no better in morning, will repeat CT. Appreciate GI help.   Assessment & Plan:   Active Problems:   Exacerbation of Crohn's disease ([place])   Bipolar 1 disorder ([place])   PTSD (post-traumatic stress disorder)   Crohn's colitis, with fistula ([facility])   GERD (gastroesophageal reflux disease)   Acute Crohn's disease ([place])  Acute Exacerbation of Crohn's disease Chronic Crohn's colitis, with fistula in terminal ileus     - currently on solumedrol 44m IV BID; plan to transition to prednisone 675mPO tomorrow per GI     - diet advanced to soft     - needs to follow up with [facility] (Dr. BlVanita Inglesin June as scheduled     - 5/16: Had an ok night, but in pain this AM. Let's reduce diet to FLD. D/w GI. May need CT in AM if pain no better. And may also need surgical consult. Will change dilaudid to something more long acting as he won't be on diluadid when he is discharged. He will need to start biologics w/ his new GI doc next month. For now, keep current dosing of prednisone.  Bipolar 1 disorder Depression PTSD Hx of SI     - no SI and mood is stable.      - continue carmabazepine/fluoxetine.  GERD     -  continue protonix  Tobacco abuse     - continue nicotine patch  Normocytic anemia     - Hgb stable, no evidence of bleed, monitor  DVT prophylaxis: lovenox Code Status: FULL   Status is: Inpatient  Remains inpatient appropriate because:Ongoing active pain requiring inpatient pain management   Dispo: The patient is from: Home              Anticipated d/c is to: Home              Anticipated d/c date is: 2 days              Patient currently is not medically stable  to d/c.  Consultants:   GI  Antimicrobials:  . None   ROS:  Reports stomach pain. Remainder 10-pt ROS is negative for all not previously mentioned.  Subjective: "I'm hurting now."  Objective: Vitals:   06/18/19 1316 06/18/19 1815 06/18/19 2010 06/19/19 0608  BP: (!) 148/80 (!) 146/80 130/67 (!) 151/87  Pulse: 76 84 79 67  Resp: 12 14 14 14   Temp: 98.4 F (36.9 C) 98.5 F (36.9 C) 98.5 F (36.9 C) (!) 97.5 F (36.4 C)  TempSrc: Oral Oral Oral Oral  SpO2: 100% 100% 97% 100%  Weight:      Height:        Intake/Output Summary (Last 24 hours) at 06/19/2019 1207 Last data filed at 06/18/2019 1819 Gross per 24 hour  Intake 240 ml  Output --  Net 240 ml   Filed Weights   06/17/19 0151  Weight: 68.9 kg    Examination:  General: 39 y.o. male resting in bed in NAD Cardiovascular: RRR, +S1, S2, no m/g/r, equal pulses throughout Respiratory: CTABL, no w/r/r, normal WOB GI: BS+, NDNT, no masses noted, no organomegaly noted MSK: No e/c/c Neuro: A&O x 3, no focal deficits Psyc: Appropriate interaction and affect, calm/cooperative   Data Reviewed: I have personally reviewed following labs and imaging studies.  CBC: Recent Labs  Lab 06/13/19 2105 06/17/19 0712 06/18/19 0436 06/19/19 0438  WBC 10.9* 11.7* 13.3* 12.4*  NEUTROABS  --   --   --  9.3*  HGB 12.6* 12.7* 11.8* 12.6*  HCT 36.5* 37.2* 35.9* 37.7*  MCV 88.4 89.4 91.8 90.8  PLT 378 391 347 706   Basic Metabolic Panel: Recent Labs  Lab 06/13/19 2105 06/17/19 0712 06/18/19 0436 06/19/19 0438  NA 140 139 137 141  K 3.5 4.3 5.0 3.5  CL 101 101 104 100  CO2 26 28 27 31   GLUCOSE 93 120* 111* 64*  BUN 16 15 9 10   CREATININE 1.12 0.82 0.65 0.80  CALCIUM 8.4* 9.0 8.7* 8.9  MG  --   --   --  2.0  PHOS  --   --   --  3.7   GFR: Estimated Creatinine Clearance: 105.9 mL/min (by C-G formula based on SCr of 0.8 mg/dL). Liver Function Tests: Recent Labs  Lab 06/13/19 2105 06/17/19 0712 06/18/19 0436  06/19/19 0438  AST 18 14* 25  --   ALT 34 26 21  --   ALKPHOS 94 84 68  --   BILITOT 0.2* 0.5 0.8  --   PROT 6.7 6.8 6.0*  --   ALBUMIN 3.8 3.8 3.4* 3.8   Recent Labs  Lab 06/13/19 2105 06/17/19 0712  LIPASE 39 29   No results for input(s): AMMONIA in the last 168 hours. Coagulation Profile: No results for input(s): INR, PROTIME in the last 168  hours. Cardiac Enzymes: No results for input(s): CKTOTAL, CKMB, CKMBINDEX, TROPONINI in the last 168 hours. BNP (last 3 results) No results for input(s): PROBNP in the last 8760 hours. HbA1C: No results for input(s): HGBA1C in the last 72 hours. CBG: No results for input(s): GLUCAP in the last 168 hours. Lipid Profile: No results for input(s): CHOL, HDL, LDLCALC, TRIG, CHOLHDL, LDLDIRECT in the last 72 hours. Thyroid Function Tests: No results for input(s): TSH, T4TOTAL, FREET4, T3FREE, THYROIDAB in the last 72 hours. Anemia Panel: No results for input(s): VITAMINB12, FOLATE, FERRITIN, TIBC, IRON, RETICCTPCT in the last 72 hours. Sepsis Labs: No results for input(s): PROCALCITON, LATICACIDVEN in the last 168 hours.  Recent Results (from the past 240 hour(s))  SARS Coronavirus 2 by RT PCR (hospital order, performed in [facility] hospital lab) Nasopharyngeal Nasopharyngeal Swab     Status: None   Collection Time: 06/17/19 10:18 AM   Specimen: Nasopharyngeal Swab  Result Value Ref Range Status   SARS Coronavirus 2 NEGATIVE NEGATIVE Final    Comment: (NOTE) SARS-CoV-2 target nucleic acids are NOT DETECTED. The SARS-CoV-2 RNA is generally detectable in upper and lower respiratory specimens during the acute phase of infection. The lowest concentration of SARS-CoV-2 viral copies this assay can detect is 250 copies / mL. A negative result does not preclude SARS-CoV-2 infection and should not be used as the sole basis for treatment or other patient management decisions.  A negative result may occur with improper specimen collection /  handling, submission of specimen other than nasopharyngeal swab, presence of viral mutation(s) within the areas targeted by this assay, and inadequate number of viral copies (<250 copies / mL). A negative result must be combined with clinical observations, patient history, and epidemiological information. Fact Sheet for Patients:   StrictlyIdeas.no Fact Sheet for Healthcare Providers: BankingDealers.co.za This test is not yet approved or cleared  by the [place] FDA and has been authorized for detection and/or diagnosis of SARS-CoV-2 by FDA under an Emergency Use Authorization (EUA).  This EUA will remain in effect (meaning this test can be used) for the duration of the COVID-19 declaration under Section 564(b)(1) of the Act, 21 U.S.C. section 360bbb-3(b)(1), unless the authorization is terminated or revoked sooner. Performed at [facility], [employer] [address] [place], [place] [address]       Radiology Studies: No results found.   Scheduled Meds: . carbamazepine  100 mg Oral BID  . enoxaparin (LOVENOX) injection  40 mg Subcutaneous Q24H  . FLUoxetine  40 mg Oral Daily  . nicotine  14 mg Transdermal Daily  . oxyCODONE  10 mg Oral Q12H  . [START ON 06/20/2019] pantoprazole  40 mg Oral Daily  . [START ON 06/20/2019] predniSONE  60 mg Oral Q breakfast   Continuous Infusions: . sodium chloride 125 mL/hr at 06/18/19 1227     LOS: 2 days    Time spent: 25 minutes spent in the coordination of care today.    Jeffery Finner, DO [facility]  If 7PM-7AM, please contact night-coverage [email or web address] 06/19/2019, 12:07 PM

## 2019-06-19 NOTE — Progress Notes (Signed)
[facility] Progress Note  Jeffery Mcintosh 39 y.o. [birth year]   Subjective: Worsened abdominal pain today. Denies N/V/hematochezia. Reports one formed nonbloody stool yesterday.  Objective: Vital signs: Vitals:   06/18/19 2010 06/19/19 0608  BP: 130/67 (!) 151/87  Pulse: 79 67  Resp: 14 14  Temp: 98.5 F (36.9 C) (!) 97.5 F (36.4 C)  SpO2: 97% 100%    Physical Exam: Gen: alert, no acute distress  HEENT: anicteric sclera CV: RRR Chest: CTA B Abd: lower abdominal tenderness with guarding, soft, nondistended, +BS Ext: no edema  Lab Results: Recent Labs    06/18/19 0436 06/19/19 0438  NA 137 141  K 5.0 3.5  CL 104 100  CO2 27 31  GLUCOSE 111* 64*  BUN 9 10  CREATININE 0.65 0.80  CALCIUM 8.7* 8.9  MG  --  2.0  PHOS  --  3.7   Recent Labs    06/17/19 0712 06/17/19 0712 06/18/19 0436 06/19/19 0438  AST 14*  --  25  --   ALT 26  --  21  --   ALKPHOS 84  --  68  --   BILITOT 0.5  --  0.8  --   PROT 6.8  --  6.0*  --   ALBUMIN 3.8   < > 3.4* 3.8   < > = values in this interval not displayed.   Recent Labs    06/18/19 0436 06/19/19 0438  WBC 13.3* 12.4*  NEUTROABS  --  9.3*  HGB 11.8* 12.6*  HCT 35.9* 37.7*  MCV 91.8 90.8  PLT 347 331      Assessment/Plan: Fistulizing Crohn's disease - transition to Prednisone 60 mg/day. Minimize narcotic pain meds. Full liquid diet. D/C tomorrow if stable. Due to noncompliance would not start a biologic as inpt. Needs to keep f/u with Dr. Renee Mcintosh at [facility] in June. May need an updated CT and consultation for an ileocectomy for the fistulizing Crohn's disease if pain does not improve. [facility] will f/u tomorrow.   Jeffery Mcintosh 06/19/2019, 12:03 PM  Questions please call [phone] ID: Jeffery Mcintosh, male   DOB: May 31, 1980, 39 y.o.   MRN: [id number]

## 2019-06-20 ENCOUNTER — Inpatient Hospital Stay (HOSPITAL_COMMUNITY): Payer: Medicare Other

## 2019-06-20 LAB — CBC WITH DIFFERENTIAL/PLATELET
Abs Immature Granulocytes: 0.12 10*3/uL — ABNORMAL HIGH (ref 0.00–0.07)
Basophils Absolute: 0 10*3/uL (ref 0.0–0.1)
Basophils Relative: 1 %
Eosinophils Absolute: 0 10*3/uL (ref 0.0–0.5)
Eosinophils Relative: 0 %
HCT: 35.8 % — ABNORMAL LOW (ref 39.0–52.0)
Hemoglobin: 12 g/dL — ABNORMAL LOW (ref 13.0–17.0)
Immature Granulocytes: 2 %
Lymphocytes Relative: 30 %
Lymphs Abs: 2.4 10*3/uL (ref 0.7–4.0)
MCH: 30.4 pg (ref 26.0–34.0)
MCHC: 33.5 g/dL (ref 30.0–36.0)
MCV: 90.6 fL (ref 80.0–100.0)
Monocytes Absolute: 0.9 10*3/uL (ref 0.1–1.0)
Monocytes Relative: 11 %
Neutro Abs: 4.7 10*3/uL (ref 1.7–7.7)
Neutrophils Relative %: 56 %
Platelets: 350 10*3/uL (ref 150–400)
RBC: 3.95 MIL/uL — ABNORMAL LOW (ref 4.22–5.81)
RDW: 14.7 % (ref 11.5–15.5)
WBC: 8.1 10*3/uL (ref 4.0–10.5)
nRBC: 0 % (ref 0.0–0.2)

## 2019-06-20 LAB — RENAL FUNCTION PANEL
Albumin: 3.3 g/dL — ABNORMAL LOW (ref 3.5–5.0)
Anion gap: 9 (ref 5–15)
BUN: 8 mg/dL (ref 6–20)
CO2: 30 mmol/L (ref 22–32)
Calcium: 9 mg/dL (ref 8.9–10.3)
Chloride: 105 mmol/L (ref 98–111)
Creatinine, Ser: 0.8 mg/dL (ref 0.61–1.24)
GFR calc Af Amer: >60 mL/min (ref >60)
GFR calc non Af Amer: >60 mL/min (ref >60)
Glucose, Bld: 89 mg/dL (ref 70–99)
Phosphorus: 4.4 mg/dL (ref 2.5–4.6)
Potassium: 4.1 mmol/L (ref 3.5–5.1)
Sodium: 144 mmol/L (ref 135–145)

## 2019-06-20 LAB — MAGNESIUM: Magnesium: 1.9 mg/dL (ref 1.7–2.4)

## 2019-06-20 MED ORDER — IOHEXOL 300 MG/ML  SOLN
100.0000 mL | Freq: Once | INTRAMUSCULAR | Status: AC | PRN
  Administered 2019-06-20: 100 mL via INTRAVENOUS

## 2019-06-20 MED ORDER — IOHEXOL 9 MG/ML PO SOLN
500.0000 mL | ORAL | Status: AC
  Administered 2019-06-20 (×2): 500 mL via ORAL

## 2019-06-20 MED ORDER — SODIUM CHLORIDE (PF) 0.9 % IJ SOLN
INTRAMUSCULAR | Status: AC
  Filled 2019-06-20: qty 50

## 2019-06-20 MED ORDER — SODIUM CHLORIDE 0.9 % IV SOLN
5.0000 mg/kg | Freq: Once | INTRAVENOUS | Status: AC
  Administered 2019-06-20: 300 mg via INTRAVENOUS
  Filled 2019-06-20: qty 30

## 2019-06-20 MED ORDER — OXYCODONE HCL ER 15 MG PO T12A
15.0000 mg | EXTENDED_RELEASE_TABLET | Freq: Two times a day (BID) | ORAL | Status: DC
  Administered 2019-06-20 – 2019-06-22 (×4): 15 mg via ORAL
  Filled 2019-06-20 (×4): qty 1

## 2019-06-20 MED ORDER — IOHEXOL 9 MG/ML PO SOLN
ORAL | Status: AC
  Filled 2019-06-20: qty 1000

## 2019-06-20 NOTE — Plan of Care (Signed)
  Problem: Education: Goal: Knowledge of General Education information will improve Description: Including pain rating scale, medication(s)/side effects and non-pharmacologic comfort measures Outcome: Progressing   Problem: Health Behavior/Discharge Planning: Goal: Ability to manage health-related needs will improve Outcome: Progressing   Problem: Activity: Goal: Risk for activity intolerance will decrease Outcome: Progressing   Problem: Nutrition: Goal: Adequate nutrition will be maintained Outcome: Progressing   Problem: Elimination: Goal: Will not experience complications related to bowel motility Outcome: Progressing Goal: Will not experience complications related to urinary retention Outcome: Progressing

## 2019-06-20 NOTE — Progress Notes (Addendum)
PROGRESS NOTE    Jeffery Mcintosh  YBW:[id number] DOB: May 25, 1980 DOA: 06/17/2019 PCP: Patient, No Pcp Per   Brief Narrative:   Jeffery Mcintosh a 39 y.o.malewith medical history significant forCrohn's disease with fistula formation, diagnosed at the age of 87 and had been on different regimen, currently followed up by [facility] returns to the ED with severe abdominal pain.He was recently admitted and treated for acute Crohn flareup and discharged on 06/10/2019, CT abdomen at that time showed marked inflammation involving the terminal ileum and cecum, redemonstration of several fistulous connection between the distal segments of the ileum. Patient was seen in the ED on 5/11 with same abdominal pain admission was requested but patient wanted to manage at home and discharged home with po oxy and prednisone.He returns to the ED with diffuse abdominal pain like his previous Crohn's exacerbation along with nausea, but no vomiting.He reports he has not been able to eat much at home, has not had bowel movements in 2 days but passing flatus well. Hemodynamically stable, afebrile, saturating 90% on room air.CXR/AXR:shows few prominent loops of small bowel in the right abdomen similar to prior study, no obstruction or free intraperitonealAIRand no active cardiopulmonary disease. labs-CMP, normal CBC with mild leukocytosis 11.7.Patient has a severe uncontrolled abdominal pain no significant improvement despite IV Dilaudid. Also received methylprednisolone 125 mg IV, normal saline 1 L bolus, Zofran. Due to ongoing symptoms and inability to tolerate p.o., he isbeing admitted for further management  5/15: Wants to advance diet today. Denies complaints. Playing on computer when I examined her. Plan to transition to PO steroids in AM. Likely d/c tomorrow.  5/16: Was feeling ok this AM and said he had an ok night. Called later this AM with pain. Says that what always happens is he goes home  and a day or two later, he is in pain. D/w GI. He has real disease. Unable to start biologic d/t history of non-compliance. Will need to start that with his new GI doc. Question if diet advanced to fast. Will change him to FLD. Won't be able to go home on dilaudid, so let's change him to something more long acting. If pain no better in morning, will repeat CT. Appreciate GI help.   5/17: Seen this morning and when was calm, watching a show on his computer. He told me that he hadn't had any BM ON and did not have much of an appetite. He stated his pain is still present. I discussed with him next steps for his Crohn's. I told him that I would order a CT ab/pelvis since his last was on 5/4. If it is worsened, the could be need for surgery to weigh in on a surgical option. I told him that we would maintain at best FLD during his stay and that he may need to keep at that level at discharge for some time. Let's see what the CT shows. Will speak with surgery if warranted.  UPDATE: Reviewed case and imaging with surgery. No surgical intervention warranted at this time. They recommend he remain medically compliant. Called by nursing that patient was requesting more pain medications and advancing diet. He says he's been in 10/10 pain for last 24 hrs. Again, during exam this morning, I had to stop him from watching a TV program on his computer. He was calm during that time. This is not c/w 10/10 pain. I again explained that we would not advanced past FLD during this stay and that he needed to  stay at that level until he sees his outpt GI doc.     Assessment & Plan:   Active Problems:   Exacerbation of Crohn's disease ([place])   Bipolar 1 disorder ([place])   PTSD (post-traumatic stress disorder)   Crohn's colitis, with fistula ([facility])   GERD (gastroesophageal reflux disease)   Acute Crohn's disease ([place])  Acute Exacerbation of Crohn's disease Chronic Crohn's colitis, with fistula in terminal ileus - currently on  solumedrol 85m IV BID; plan to transition to prednisone 610mPO tomorrow per GI - diet advanced to soft - needs to follow up with [facility] (Dr. BlVanita Inglesin June as scheduled     - 5/16: Had an ok night, but in pain this AM. Let's reduce diet to FLD. D/w GI. May need CT in AM if pain no better. And may also need surgical consult. Will change dilaudid to something more long acting as he won't be on diluadid when he is discharged. He will need to start biologics w/ his new GI doc next month. For now, keep current dosing of prednisone.     - 5/17: CT ordered. He has TTP to lower half of abdomen, no fevers, no leukocytosis. If CT warrants, will speak with surgery.  Bipolar 1 disorder Depression PTSD Hx of SI - no SI and mood is stable.  - continue carmabazepine/fluoxetine.  GERD - continue protonix  Tobacco abuse - continue nicotine patch  Normocytic anemia - Hgb stable, no evidence of bleed, monitor  DVT prophylaxis: lovenox Code Status: FULL   Status is: Inpatient  Remains inpatient appropriate because:Ongoing active pain requiring inpatient pain management   Dispo: The patient is from: Home              Anticipated d/c is to: Home              Anticipated d/c date is: 2 days              Patient currently is not medically stable to d/c.  Consultants:   GI  ROS:  Reports stomach pain. Denies D, N, V. Reports lack of appetite . Remainder 10-pt ROS is negative for all not previously mentioned.  Subjective: "It was ok."  Objective: Vitals:   06/19/19 0608 06/19/19 1347 06/19/19 2108 06/20/19 0501  BP: (!) 151/87 (!) 149/96 139/87 (!) 124/59  Pulse: 67 76 74 80  Resp: 14 16 16 14   Temp: (!) 97.5 F (36.4 C) 98.5 F (36.9 C) 98.8 F (37.1 C) 98.3 F (36.8 C)  TempSrc: Oral Oral Oral Oral  SpO2: 100% 100% 100% 97%  Weight:      Height:        Intake/Output Summary (Last 24 hours) at 06/20/2019 1241 Last data filed at 06/20/2019  0600 Gross per 24 hour  Intake 6863.86 ml  Output --  Net 6863.86 ml   Filed Weights   06/17/19 0151  Weight: 68.9 kg    Examination:  General: [age].o. male resting in bed in NAD Cardiovascular: RRR, +S1, S2, no m/g/r, equal pulses throughout Respiratory: CTABL, no w/r/r, normal WOB GI: BS+, TTP lower quads, no organomegaly noted MSK: No e/c/c Neuro: A&O x 3, no focal deficits Psyc: Appropriate interaction and affect, calm/cooperative   Data Reviewed: I have personally reviewed following labs and imaging studies.  CBC: Recent Labs  Lab 06/13/19 2105 06/17/19 0712 06/18/19 0436 06/19/19 0438 06/20/19 0322  WBC 10.9* 11.7* 13.3* 12.4* 8.1  NEUTROABS  --   --   --  9.3* 4.7  HGB 12.6* 12.7* 11.8* 12.6* 12.0*  HCT 36.5* 37.2* 35.9* 37.7* 35.8*  MCV 88.4 89.4 91.8 90.8 90.6  PLT 378 391 347 331 315   Basic Metabolic Panel: Recent Labs  Lab 06/13/19 2105 06/17/19 0712 06/18/19 0436 06/19/19 0438 06/20/19 0322  NA 140 139 137 141 144  K 3.5 4.3 5.0 3.5 4.1  CL 101 101 104 100 105  CO2 26 28 27 31 30   GLUCOSE 93 120* 111* 64* 89  BUN 16 15 9 10 8   CREATININE 1.12 0.82 0.65 0.80 0.80  CALCIUM 8.4* 9.0 8.7* 8.9 9.0  MG  --   --   --  2.0 1.9  PHOS  --   --   --  3.7 4.4   GFR: Estimated Creatinine Clearance: 105.9 mL/min (by C-G formula based on SCr of 0.8 mg/dL). Liver Function Tests: Recent Labs  Lab 06/13/19 2105 06/17/19 0712 06/18/19 0436 06/19/19 0438 06/20/19 0322  AST 18 14* 25  --   --   ALT 34 26 21  --   --   ALKPHOS 94 84 68  --   --   BILITOT 0.2* 0.5 0.8  --   --   PROT 6.7 6.8 6.0*  --   --   ALBUMIN 3.8 3.8 3.4* 3.8 3.3*   Recent Labs  Lab 06/13/19 2105 06/17/19 0712  LIPASE 39 29   No results for input(s): AMMONIA in the last 168 hours. Coagulation Profile: No results for input(s): INR, PROTIME in the last 168 hours. Cardiac Enzymes: No results for input(s): CKTOTAL, CKMB, CKMBINDEX, TROPONINI in the last 168 hours. BNP  (last 3 results) No results for input(s): PROBNP in the last 8760 hours. HbA1C: No results for input(s): HGBA1C in the last 72 hours. CBG: No results for input(s): GLUCAP in the last 168 hours. Lipid Profile: No results for input(s): CHOL, HDL, LDLCALC, TRIG, CHOLHDL, LDLDIRECT in the last 72 hours. Thyroid Function Tests: No results for input(s): TSH, T4TOTAL, FREET4, T3FREE, THYROIDAB in the last 72 hours. Anemia Panel: No results for input(s): VITAMINB12, FOLATE, FERRITIN, TIBC, IRON, RETICCTPCT in the last 72 hours. Sepsis Labs: No results for input(s): PROCALCITON, LATICACIDVEN in the last 168 hours.  Recent Results (from the past 240 hour(s))  SARS Coronavirus 2 by RT PCR (hospital order, performed in [facility] hospital lab) Nasopharyngeal Nasopharyngeal Swab     Status: None   Collection Time: 06/17/19 10:18 AM   Specimen: Nasopharyngeal Swab  Result Value Ref Range Status   SARS Coronavirus 2 NEGATIVE NEGATIVE Final    Comment: (NOTE) SARS-CoV-2 target nucleic acids are NOT DETECTED. The SARS-CoV-2 RNA is generally detectable in upper and lower respiratory specimens during the acute phase of infection. The lowest concentration of SARS-CoV-2 viral copies this assay can detect is 250 copies / mL. A negative result does not preclude SARS-CoV-2 infection and should not be used as the sole basis for treatment or other patient management decisions.  A negative result may occur with improper specimen collection / handling, submission of specimen other than nasopharyngeal swab, presence of viral mutation(s) within the areas targeted by this assay, and inadequate number of viral copies (<250 copies / mL). A negative result must be combined with clinical observations, patient history, and epidemiological information. Fact Sheet for Patients:   StrictlyIdeas.no Fact Sheet for Healthcare Providers: BankingDealers.co.za This test is not  yet approved or cleared  by the [place] FDA and has been authorized for detection and/or diagnosis  of SARS-CoV-2 by FDA under an Emergency Use Authorization (EUA).  This EUA will remain in effect (meaning this test can be used) for the duration of the COVID-19 declaration under Section 564(b)(1) of the Act, 21 U.S.C. section 360bbb-3(b)(1), unless the authorization is terminated or revoked sooner. Performed at [facility], [place] [address], [place], [place] [address]       Radiology Studies: CT ABDOMEN PELVIS W CONTRAST  Result Date: 06/20/2019 CLINICAL DATA:  Crohn's exacerbation. EXAM: CT ABDOMEN AND PELVIS WITH CONTRAST TECHNIQUE: Multidetector CT imaging of the abdomen and pelvis was performed using the standard protocol following bolus administration of intravenous contrast. CONTRAST:  152m OMNIPAQUE IOHEXOL 300 MG/ML  SOLN COMPARISON:  Jun 07, 2019. FINDINGS: Lower chest: No acute abnormality. Hepatobiliary: No focal liver abnormality is seen. No gallstones, gallbladder wall thickening, or biliary dilatation. Pancreas: Unremarkable. No pancreatic ductal dilatation or surrounding inflammatory changes. Spleen: Normal in size without focal abnormality. Adrenals/Urinary Tract: Adrenal glands are unremarkable. Kidneys are normal, without renal calculi, focal lesion, or hydronephrosis. Bladder is unremarkable. Stomach/Bowel: The stomach appears normal. There is again noted wall thickening involving the terminal ileum and cecum consistent with Crohn's disease. Mild inflammatory changes are noted suggesting some component of acute inflammation. Once again, there are multiple fistula noted involving the terminal ileum. The appendix is visualized but does not appear to be acutely inflamed. Vascular/Lymphatic: No significant vascular findings are present. No enlarged abdominal or pelvic lymph nodes. Reproductive: Prostate is unremarkable. Other: Small amount of free fluid is noted  in the pelvis most likely related to Crohn's disease. No hernia is noted. Musculoskeletal: No acute or significant osseous findings. IMPRESSION: 1. There is again noted wall thickening involving the terminal ileum and cecum consistent with Crohn's disease. Mild inflammatory changes are noted suggesting some component of acute inflammation. Once again, there are multiple fistula noted involving the terminal ileum. Small amount of free fluid is noted in the pelvis most likely related to Crohn's disease. Electronically Signed   By: JMarijo ConceptionM.D.   On: 06/20/2019 12:31     Scheduled Meds: . carbamazepine  100 mg Oral BID  . enoxaparin (LOVENOX) injection  40 mg Subcutaneous Q24H  . FLUoxetine  40 mg Oral Daily  . iohexol      . nicotine  14 mg Transdermal Daily  . oxyCODONE  10 mg Oral Q12H  . pantoprazole  40 mg Oral Daily  . predniSONE  60 mg Oral Q breakfast  . sodium chloride (PF)       Continuous Infusions: . sodium chloride 125 mL/hr at 06/20/19 0700     LOS: 3 days    Time spent: 25 minutes spent in the coordination of care today.    TJonnie Finner DO [facility]  If 7PM-7AM, please contact night-coverage [email or web address] 06/20/2019, 12:41 PM

## 2019-06-20 NOTE — Progress Notes (Signed)
[facility] Progress Note  Zed Wanninger 39 y.o. 1980-10-10  CC:  Abdominal pain, Crohn's disease flare   Subjective: Patient reports worsening abdominal pain, currently 10/10.  Pain worse in seated position.  Had a loose bowel movement this morning without any bleeding.  Nauseated but no vomiting.  ROS : Review of Systems  Respiratory: Negative for cough and shortness of breath.   Cardiovascular: Negative for chest pain and palpitations.  Gastrointestinal: Positive for abdominal pain, diarrhea and nausea. Negative for blood in stool, constipation, heartburn, melena and vomiting.    Objective: Vital signs in last 24 hours: Vitals:   06/19/19 2108 06/20/19 0501  BP: 139/87 (!) 124/59  Pulse: 74 80  Resp: 16 14  Temp: 98.8 F (37.1 C) 98.3 F (36.8 C)  SpO2: 100% 97%    Physical Exam:  General:  Alert, cooperative; standing at bedside, appears in pain  Head:  Normocephalic, without obvious abnormality, atraumatic  Eyes:  Anicteric sclera, EOMs intact,   Lungs:   Clear to auscultation bilaterally, respirations unlabored  Heart:  Regular rate and rhythm, S1/S2 normal  Abdomen:   Somewhat firm, moderately tender to palpation of bilateral lower quadrants (RLQ> LLQ), moderately distended, right-sided mass palpated, normoactive bowel sounds x 4 quadrants  Extremities: Extremities normal, atraumatic, no  edema  Pulses: 2+ and symmetric    Lab Results: Recent Labs    06/19/19 0438 06/20/19 0322  NA 141 144  K 3.5 4.1  CL 100 105  CO2 31 30  GLUCOSE 64* 89  BUN 10 8  CREATININE 0.80 0.80  CALCIUM 8.9 9.0  MG 2.0 1.9  PHOS 3.7 4.4   Recent Labs    06/18/19 0436 06/18/19 0436 06/19/19 0438 06/20/19 0322  AST 25  --   --   --   ALT 21  --   --   --   ALKPHOS 68  --   --   --   BILITOT 0.8  --   --   --   PROT 6.0*  --   --   --   ALBUMIN 3.4*   < > 3.8 3.3*   < > = values in this interval not displayed.   Recent Labs    06/19/19 0438  06/20/19 0322  WBC 12.4* 8.1  NEUTROABS 9.3* 4.7  HGB 12.6* 12.0*  HCT 37.7* 35.8*  MCV 90.8 90.6  PLT 331 350   No results for input(s): LABPROT, INR in the last 72 hours.  Impression: Crohn's disease exacerbation -Worsening abdominal exam in regard to distention and firmness, now with palpable mass in right abdomen -CT on5/05/2019 showedMarked inflammation involving the terminal ileum and cecum centered upon the ileocecal valve compatible with active Crohn's flare. Redemonstration of several fistulous connections between the distal segments of the ileum. -Mild leukocytosis now resolved (WBC 8.1)  -Hgb stable (12.0) -CRP WNL  GERD, chronic, on Protonix 40m qd as an outpatient.  Plan: -Agree with CT scan today.  Concern for stricture based on today's exam and ongoing pain.  Pending CT, may need surgical consultation -Patient was transitioned to 668mPO prednisone today -ContinueProtonix qd for GERD.  -Continue supportive care and pain control  [facility] will follow.  AlSalley SlaughterA-C 06/20/2019, 10:47 AM  Contact #  [phone]

## 2019-06-20 NOTE — Care Management Important Message (Signed)
Important Message  Patient Details IM Letter given to Nancy Marus RN Case Manager to present to the Patient Name: Jeffery Mcintosh MRN: [id number] Date of Birth: 1980-08-17   Medicare Important Message Given:  Yes     Kerin Salen 06/20/2019, 12:34 PM

## 2019-06-21 LAB — COMPREHENSIVE METABOLIC PANEL
ALT: 33 U/L (ref 0–44)
AST: 12 U/L — ABNORMAL LOW (ref 15–41)
Albumin: 3.2 g/dL — ABNORMAL LOW (ref 3.5–5.0)
Alkaline Phosphatase: 73 U/L (ref 38–126)
Anion gap: 11 (ref 5–15)
BUN: 7 mg/dL (ref 6–20)
CO2: 29 mmol/L (ref 22–32)
Calcium: 8.8 mg/dL — ABNORMAL LOW (ref 8.9–10.3)
Chloride: 101 mmol/L (ref 98–111)
Creatinine, Ser: 0.83 mg/dL (ref 0.61–1.24)
GFR calc Af Amer: >60 mL/min (ref >60)
GFR calc non Af Amer: >60 mL/min (ref >60)
Glucose, Bld: 89 mg/dL (ref 70–99)
Potassium: 3.8 mmol/L (ref 3.5–5.1)
Sodium: 141 mmol/L (ref 135–145)
Total Bilirubin: 0.5 mg/dL (ref 0.3–1.2)
Total Protein: 5.8 g/dL — ABNORMAL LOW (ref 6.5–8.1)

## 2019-06-21 LAB — CBC WITH DIFFERENTIAL/PLATELET
Abs Immature Granulocytes: 0.09 10*3/uL — ABNORMAL HIGH (ref 0.00–0.07)
Basophils Absolute: 0 10*3/uL (ref 0.0–0.1)
Basophils Relative: 0 %
Eosinophils Absolute: 0 10*3/uL (ref 0.0–0.5)
Eosinophils Relative: 0 %
HCT: 35 % — ABNORMAL LOW (ref 39.0–52.0)
Hemoglobin: 11.9 g/dL — ABNORMAL LOW (ref 13.0–17.0)
Immature Granulocytes: 1 %
Lymphocytes Relative: 26 %
Lymphs Abs: 2.4 10*3/uL (ref 0.7–4.0)
MCH: 30.6 pg (ref 26.0–34.0)
MCHC: 34 g/dL (ref 30.0–36.0)
MCV: 90 fL (ref 80.0–100.0)
Monocytes Absolute: 0.8 10*3/uL (ref 0.1–1.0)
Monocytes Relative: 8 %
Neutro Abs: 6.1 10*3/uL (ref 1.7–7.7)
Neutrophils Relative %: 65 %
Platelets: 319 10*3/uL (ref 150–400)
RBC: 3.89 MIL/uL — ABNORMAL LOW (ref 4.22–5.81)
RDW: 14.6 % (ref 11.5–15.5)
WBC: 9.5 10*3/uL (ref 4.0–10.5)
nRBC: 0 % (ref 0.0–0.2)

## 2019-06-21 LAB — HCV AB W REFLEX TO QUANT PCR: HCV Ab: <0.1 s/co ratio (ref 0.0–0.9)

## 2019-06-21 LAB — HCV INTERPRETATION

## 2019-06-21 NOTE — Progress Notes (Signed)
[facility] Progress Note  Jeffery Mcintosh 39 y.o. 1980/02/14  CC:  Abdominal pain, Crohn's disease flare   Subjective: Patient reports feeling better today.  He received his Remicade infusion yesterday without any adverse effects.  His abdominal pain has decreased, currently around a 6/10.  Last bowel movement was yesterday and was loose but without any signs of bleeding.  He is tolerating his diet and denies nausea/vomiting.  ROS : Review of Systems  Constitutional: Negative for chills and fever.  Respiratory: Negative for cough and shortness of breath.   Cardiovascular: Negative for chest pain and palpitations.  Gastrointestinal: Positive for abdominal pain and diarrhea. Negative for blood in stool, constipation, heartburn, melena, nausea and vomiting.    Objective: Vital signs in last 24 hours: Vitals:   06/20/19 2035 06/21/19 0505  BP: (!) 151/89 133/82  Pulse: 72 80  Resp: 18 18  Temp: 98.2 F (36.8 C) 98 F (36.7 C)  SpO2: 98% 98%    Physical Exam:  General:  Alert, cooperative, no distress, appears stated age  Head:  Normocephalic, without obvious abnormality, atraumatic  Eyes:  Anicteric sclera, EOM's intact,   Lungs:   Clear to auscultation bilaterally, respirations unlabored  Heart:  Regular rate and rhythm, S1/S2 normal  Abdomen:   Soft, non-tender, mildly distended, normoactive bowel sounds active, no masses, no guarding or peritoneal signs  Extremities: Extremities normal, atraumatic, no  edema  Pulses: 2+ and symmetric    Lab Results: Recent Labs    06/19/19 0438 06/19/19 0438 06/20/19 0322 06/21/19 0715  NA 141   < > 144 141  K 3.5   < > 4.1 3.8  CL 100   < > 105 101  CO2 31   < > 30 29  GLUCOSE 64*   < > 89 89  BUN 10   < > 8 7  CREATININE 0.80   < > 0.80 0.83  CALCIUM 8.9   < > 9.0 8.8*  MG 2.0  --  1.9  --   PHOS 3.7  --  4.4  --    < > = values in this interval not displayed.   Recent Labs    06/20/19 0322 06/21/19 0715   AST  --  12*  ALT  --  33  ALKPHOS  --  73  BILITOT  --  0.5  PROT  --  5.8*  ALBUMIN 3.3* 3.2*   Recent Labs    06/20/19 0322 06/21/19 0715  WBC 8.1 9.5  NEUTROABS 4.7 6.1  HGB 12.0* 11.9*  HCT 35.8* 35.0*  MCV 90.6 90.0  PLT 350 319   No results for input(s): LABPROT, INR in the last 72 hours.  Impression: Crohn's disease exacerbation -CT 06/20/19 showed wall thickening involving the terminal ileum and cecum consistent with Crohn's disease. Mild inflammatory changes are noted suggesting some component of acute inflammation. Once again, there are multiple fistula noted involving the terminal ileum.  -Abdominal exam improved from yesterday and pain is decreasing -Mild leukocytosis now resolved (WBC 9.5)  -Hgbstable (11.9) -CRP WNL  GERD, chronic, on Protonix 62m qd as an outpatient.  Plan: -Patient was transitioned to 658mPO prednisone yesterday.  Recommend transition to 4075mt time of discharge. -ContinueProtonixqdfor GERD.  -Continue supportive careand pain control. -If pain continues to improve, anticipate discharge within the next 1-2 days.  Patient will need second Remicade infusion in 2 weeks.  [facility] will follow.  AliSalley Slaughter-C 06/21/2019, 10:39 AM  Contact #  [phone]

## 2019-06-21 NOTE — Progress Notes (Signed)
Marland Kitchen  PROGRESS NOTE    Jeffery Mcintosh  PFX:[id number] DOB: 1980-03-17 DOA: 06/17/2019 PCP: Patient, No Pcp Per   Brief Narrative:   Jeffery Mcintosh a 39 y.o.malewith medical history significant forCrohn's disease with fistula formation, diagnosed at the age of 71 and had been on different regimen, currently followed up by [place] returns to the ED with severe abdominal pain.He was recently admitted and treated for acute Crohn flareup and discharged on 06/10/2019, CT abdomen at that time showed marked inflammation involving the terminal ileum and cecum, redemonstration of several fistulous connection between the distal segments of the ileum. Patient was seen in the ED on 5/11 with same abdominal pain admission was requested but patient wanted to manage at home and discharged home with po oxy and prednisone.He returns to the ED with diffuse abdominal pain like his previous Crohn's exacerbation along with nausea, but no vomiting.He reports he has not been able to eat much at home, has not had bowel movements in 2 days but passing flatus well. Hemodynamically stable, afebrile, saturating 90% on room air.CXR/AXR:shows few prominent loops of small bowel in the right abdomen similar to prior study, no obstruction or free intraperitonealAIRand no active cardiopulmonary disease. labs-CMP, normal CBC with mild leukocytosis 11.7.Patient has a severe uncontrolled abdominal pain no significant improvement despite IV Dilaudid. Also received methylprednisolone 125 mg IV, normal saline 1 L bolus, Zofran. Due to ongoing symptoms and inability to tolerate p.o., he isbeing admitted for further management  5/17: Seen this morning and when was calm, watching a show on his computer. He told me that he hadn't had any BM ON and did not have much of an appetite. He stated his pain is still present. I discussed with him next steps for his Crohn's. I told him that I would order a CT ab/pelvis since his  last was on 5/4. If it is worsened, the could be need for surgery to weigh in on a surgical option. I told him that we would maintain at best FLD during his stay and that he may need to keep at that level at discharge for some time. Let's see what the CT shows. Will speak with surgery if warranted. UPDATE: Reviewed case and imaging with surgery. No surgical intervention warranted at this time. They recommend he remain medically compliant. Called by nursing that patient was requesting more pain medications and advancing diet. He says he's been in 10/10 pain for last 24 hrs. Again, during exam this morning, I had to stop him from watching a TV program on his computer. He was calm during that time. This is not c/w 10/10 pain. I again explained that we would not advanced past FLD during this stay and that he needed to stay at that level until he sees his outpt GI doc.    5/18: No acute events ON. Started on remicaide w/ GI. Asleep this morning. No changes for today.    Assessment & Plan:   Active Problems:   Exacerbation of Crohn's disease ([place])   Bipolar 1 disorder ([place])   PTSD (post-traumatic stress disorder)   Crohn's colitis, with fistula ([facility])   GERD (gastroesophageal reflux disease)   Acute Crohn's disease ([place])  Acute Exacerbation of Crohn's disease Chronic Crohn's colitis, with fistula in terminal ileus - currently on solumedrol 20m IV BID; plan to transition to prednisone 681mPO tomorrow per GI - diet advanced to soft - needs to follow up with [facility] (Dr. BlVanita Inglesin June as scheduled - 5/16:  Had an ok night, but in pain this AM. Let's reduce diet to FLD. D/w GI. May need CT in AM if pain no better. And may also need surgical consult. Will change dilaudid to something more long acting as he won't be on diluadid when he is discharged. He will need to start biologics w/ his new GI doc next month. For now, keep current dosing of prednisone.     - 5/17: CT ordered. He has  TTP to lower half of abdomen, no fevers, no leukocytosis. If CT warrants, will speak with surgery.     - 5/18: Spoke with surgery about CT results. No surgical intervention at this time. They recommend he stay medically compliant. Started on remicaide by GI. No acute events ON. Continue current tx.   Bipolar 1 disorder Depression PTSD Hx of SI - no SI and mood is stable.  - continue carmabazepine/fluoxetine.  GERD - continue protonix  Tobacco abuse - continue nicotine patch  Normocytic anemia - Hgb stable, no evidence of bleed, monitor  DVT prophylaxis: lovenox Code Status: FULL   Status is: Inpatient  Remains inpatient appropriate because:Ongoing active pain requiring inpatient pain management   Dispo: The patient is from: Home              Anticipated d/c is to: Home              Anticipated d/c date is: 1 day              Patient currently is not medically stable to d/c.  Consultants:   GI   Subjective: No acute events ON.   Objective: Vitals:   06/20/19 0501 06/20/19 1402 06/20/19 2035 06/21/19 0505  BP: (!) 124/59 120/75 (!) 151/89 133/82  Pulse: 80 79 72 80  Resp: 14 17 18 18   Temp: 98.3 F (36.8 C) 98.4 F (36.9 C) 98.2 F (36.8 C) 98 F (36.7 C)  TempSrc: Oral Oral Oral Oral  SpO2: 97% 98% 98% 98%  Weight:      Height:        Intake/Output Summary (Last 24 hours) at 06/21/2019 1306 Last data filed at 06/21/2019 0900 Gross per 24 hour  Intake 3630 ml  Output --  Net 3630 ml   Filed Weights   06/17/19 0151  Weight: 68.9 kg    Examination:  General: 39 y.o. male resting in bed in NAD Cardiovascular: RRR, +S1, S2, no m/g/r, equal pulses throughout Respiratory: CTABL, no w/r/r, normal WOB, snoring GI: BS+, NDNT, soft MSK: No e/c/c Neuro: somnolent  Data Reviewed: I have personally reviewed following labs and imaging studies.  CBC: Recent Labs  Lab 06/17/19 0712 06/18/19 0436 06/19/19 0438 06/20/19 0322  06/21/19 0715  WBC 11.7* 13.3* 12.4* 8.1 9.5  NEUTROABS  --   --  9.3* 4.7 6.1  HGB 12.7* 11.8* 12.6* 12.0* 11.9*  HCT 37.2* 35.9* 37.7* 35.8* 35.0*  MCV 89.4 91.8 90.8 90.6 90.0  PLT 391 347 331 350 599   Basic Metabolic Panel: Recent Labs  Lab 06/17/19 0712 06/18/19 0436 06/19/19 0438 06/20/19 0322 06/21/19 0715  NA 139 137 141 144 141  K 4.3 5.0 3.5 4.1 3.8  CL 101 104 100 105 101  CO2 28 27 31 30 29   GLUCOSE 120* 111* 64* 89 89  BUN 15 9 10 8 7   CREATININE 0.82 0.65 0.80 0.80 0.83  CALCIUM 9.0 8.7* 8.9 9.0 8.8*  MG  --   --  2.0 1.9  --  PHOS  --   --  3.7 4.4  --    GFR: Estimated Creatinine Clearance: 102 mL/min (by C-G formula based on SCr of 0.83 mg/dL). Liver Function Tests: Recent Labs  Lab 06/17/19 0712 06/18/19 0436 06/19/19 0438 06/20/19 0322 06/21/19 0715  AST 14* 25  --   --  12*  ALT 26 21  --   --  33  ALKPHOS 84 68  --   --  73  BILITOT 0.5 0.8  --   --  0.5  PROT 6.8 6.0*  --   --  5.8*  ALBUMIN 3.8 3.4* 3.8 3.3* 3.2*   Recent Labs  Lab 06/17/19 0712  LIPASE 29   No results for input(s): AMMONIA in the last 168 hours. Coagulation Profile: No results for input(s): INR, PROTIME in the last 168 hours. Cardiac Enzymes: No results for input(s): CKTOTAL, CKMB, CKMBINDEX, TROPONINI in the last 168 hours. BNP (last 3 results) No results for input(s): PROBNP in the last 8760 hours. HbA1C: No results for input(s): HGBA1C in the last 72 hours. CBG: No results for input(s): GLUCAP in the last 168 hours. Lipid Profile: No results for input(s): CHOL, HDL, LDLCALC, TRIG, CHOLHDL, LDLDIRECT in the last 72 hours. Thyroid Function Tests: No results for input(s): TSH, T4TOTAL, FREET4, T3FREE, THYROIDAB in the last 72 hours. Anemia Panel: No results for input(s): VITAMINB12, FOLATE, FERRITIN, TIBC, IRON, RETICCTPCT in the last 72 hours. Sepsis Labs: No results for input(s): PROCALCITON, LATICACIDVEN in the last 168 hours.  Recent Results (from the  past 240 hour(s))  SARS Coronavirus 2 by RT PCR (hospital order, performed in [facility] hospital lab) Nasopharyngeal Nasopharyngeal Swab     Status: None   Collection Time: 06/17/19 10:18 AM   Specimen: Nasopharyngeal Swab  Result Value Ref Range Status   SARS Coronavirus 2 NEGATIVE NEGATIVE Final    Comment: (NOTE) SARS-CoV-2 target nucleic acids are NOT DETECTED. The SARS-CoV-2 RNA is generally detectable in upper and lower respiratory specimens during the acute phase of infection. The lowest concentration of SARS-CoV-2 viral copies this assay can detect is 250 copies / mL. A negative result does not preclude SARS-CoV-2 infection and should not be used as the sole basis for treatment or other patient management decisions.  A negative result may occur with improper specimen collection / handling, submission of specimen other than nasopharyngeal swab, presence of viral mutation(s) within the areas targeted by this assay, and inadequate number of viral copies (<250 copies / mL). A negative result must be combined with clinical observations, patient history, and epidemiological information. Fact Sheet for Patients:   StrictlyIdeas.no Fact Sheet for Healthcare Providers: BankingDealers.co.za This test is not yet approved or cleared  by the [place] FDA and has been authorized for detection and/or diagnosis of SARS-CoV-2 by FDA under an Emergency Use Authorization (EUA).  This EUA will remain in effect (meaning this test can be used) for the duration of the COVID-19 declaration under Section 564(b)(1) of the Act, 21 U.S.C. section 360bbb-3(b)(1), unless the authorization is terminated or revoked sooner. Performed at [facility], [place] [address], [place], [place] [address]       Radiology Studies: CT ABDOMEN PELVIS W CONTRAST  Result Date: 06/20/2019 CLINICAL DATA:  Crohn's exacerbation. EXAM: CT ABDOMEN AND  PELVIS WITH CONTRAST TECHNIQUE: Multidetector CT imaging of the abdomen and pelvis was performed using the standard protocol following bolus administration of intravenous contrast. CONTRAST:  115m OMNIPAQUE IOHEXOL 300 MG/ML  SOLN COMPARISON:  Jun 07, 2019. FINDINGS: Lower chest: No acute abnormality. Hepatobiliary: No focal liver abnormality is seen. No gallstones, gallbladder wall thickening, or biliary dilatation. Pancreas: Unremarkable. No pancreatic ductal dilatation or surrounding inflammatory changes. Spleen: Normal in size without focal abnormality. Adrenals/Urinary Tract: Adrenal glands are unremarkable. Kidneys are normal, without renal calculi, focal lesion, or hydronephrosis. Bladder is unremarkable. Stomach/Bowel: The stomach appears normal. There is again noted wall thickening involving the terminal ileum and cecum consistent with Crohn's disease. Mild inflammatory changes are noted suggesting some component of acute inflammation. Once again, there are multiple fistula noted involving the terminal ileum. The appendix is visualized but does not appear to be acutely inflamed. Vascular/Lymphatic: No significant vascular findings are present. No enlarged abdominal or pelvic lymph nodes. Reproductive: Prostate is unremarkable. Other: Small amount of free fluid is noted in the pelvis most likely related to Crohn's disease. No hernia is noted. Musculoskeletal: No acute or significant osseous findings. IMPRESSION: 1. There is again noted wall thickening involving the terminal ileum and cecum consistent with Crohn's disease. Mild inflammatory changes are noted suggesting some component of acute inflammation. Once again, there are multiple fistula noted involving the terminal ileum. Small amount of free fluid is noted in the pelvis most likely related to Crohn's disease. Electronically Signed   By: Marijo Conception M.D.   On: 06/20/2019 12:31     Scheduled Meds: . carbamazepine  100 mg Oral BID  .  enoxaparin (LOVENOX) injection  40 mg Subcutaneous Q24H  . FLUoxetine  40 mg Oral Daily  . nicotine  14 mg Transdermal Daily  . oxyCODONE  15 mg Oral Q12H  . pantoprazole  40 mg Oral Daily  . predniSONE  60 mg Oral Q breakfast   Continuous Infusions: . sodium chloride 125 mL/hr at 06/20/19 0700     LOS: 4 days    Time spent: 15 minutes spent in the coordination of care today.    Jonnie Finner, DO [facility]  If 7PM-7AM, please contact night-coverage [email or web address] 06/21/2019, 1:06 PM

## 2019-06-21 NOTE — Plan of Care (Signed)

## 2019-06-22 LAB — COMPREHENSIVE METABOLIC PANEL
ALT: 29 U/L (ref 0–44)
AST: 12 U/L — ABNORMAL LOW (ref 15–41)
Albumin: 3.3 g/dL — ABNORMAL LOW (ref 3.5–5.0)
Alkaline Phosphatase: 83 U/L (ref 38–126)
Anion gap: 10 (ref 5–15)
BUN: 13 mg/dL (ref 6–20)
CO2: 30 mmol/L (ref 22–32)
Calcium: 8.8 mg/dL — ABNORMAL LOW (ref 8.9–10.3)
Chloride: 100 mmol/L (ref 98–111)
Creatinine, Ser: 0.82 mg/dL (ref 0.61–1.24)
GFR calc Af Amer: >60 mL/min (ref >60)
GFR calc non Af Amer: >60 mL/min (ref >60)
Glucose, Bld: 96 mg/dL (ref 70–99)
Potassium: 4 mmol/L (ref 3.5–5.1)
Sodium: 140 mmol/L (ref 135–145)
Total Bilirubin: 0.2 mg/dL — ABNORMAL LOW (ref 0.3–1.2)
Total Protein: 5.9 g/dL — ABNORMAL LOW (ref 6.5–8.1)

## 2019-06-22 LAB — CBC WITH DIFFERENTIAL/PLATELET
Abs Immature Granulocytes: 0.09 10*3/uL — ABNORMAL HIGH (ref 0.00–0.07)
Basophils Absolute: 0 10*3/uL (ref 0.0–0.1)
Basophils Relative: 0 %
Eosinophils Absolute: 0 10*3/uL (ref 0.0–0.5)
Eosinophils Relative: 0 %
HCT: 36.6 % — ABNORMAL LOW (ref 39.0–52.0)
Hemoglobin: 12.3 g/dL — ABNORMAL LOW (ref 13.0–17.0)
Immature Granulocytes: 1 %
Lymphocytes Relative: 22 %
Lymphs Abs: 2.1 10*3/uL (ref 0.7–4.0)
MCH: 30.1 pg (ref 26.0–34.0)
MCHC: 33.6 g/dL (ref 30.0–36.0)
MCV: 89.5 fL (ref 80.0–100.0)
Monocytes Absolute: 0.8 10*3/uL (ref 0.1–1.0)
Monocytes Relative: 8 %
Neutro Abs: 6.5 10*3/uL (ref 1.7–7.7)
Neutrophils Relative %: 69 %
Platelets: 347 10*3/uL (ref 150–400)
RBC: 4.09 MIL/uL — ABNORMAL LOW (ref 4.22–5.81)
RDW: 14.6 % (ref 11.5–15.5)
WBC: 9.6 10*3/uL (ref 4.0–10.5)
nRBC: 0 % (ref 0.0–0.2)

## 2019-06-22 LAB — MAGNESIUM: Magnesium: 2 mg/dL (ref 1.7–2.4)

## 2019-06-22 MED ORDER — OXYCODONE HCL ER 15 MG PO T12A: 15.0000 mg | EXTENDED_RELEASE_TABLET | Freq: Two times a day (BID) | ORAL | 0 refills | Status: AC

## 2019-06-22 MED ORDER — OXYCODONE HCL 5 MG PO TABS: 5.0000 mg | ORAL_TABLET | Freq: Four times a day (QID) | ORAL | 0 refills | Status: DC | PRN

## 2019-06-22 MED ORDER — PREDNISONE 20 MG PO TABS: ORAL_TABLET | ORAL | 0 refills | Status: AC

## 2019-06-22 NOTE — Progress Notes (Signed)
Jeffery Mcintosh 9:05 AM  Subjective: Patient doing well without any GI complaints tolerated breakfast wants to go home  Objective: Vital signs stable afebrile no acute distress patient not examined today looks well labs okay  Assessment: Crohn's disease  Plan: I discussed the discharge plan with him including our PA to set up repeat Remicade in 2 weeks and he will see Dr. Michail Sermon in 3 weeks and then go to [facility] in 5 weeks and we discussed the slow wean of prednisone beginning at 40 mg and decreasing by 10 mg every week until he sees Dr. Michail Sermon who can adjust it further and please call us sooner if we can be of any further assistance  [facility]  office [phone] After 5PM or if no answer call [phone]

## 2019-06-22 NOTE — Discharge Summary (Signed)
Physician Discharge Summary  Jeffery Mcintosh QRF:[id number] DOB: 1981-01-18 DOA: 06/17/2019  PCP: Patient, No Pcp Per  Admit date: 06/17/2019 Discharge date: 06/22/2019  Time spent: 40 minutes  Recommendations for Outpatient Follow-up:  1. Follow outpatient CBC/CMP  2. Follow outpatient with [facility] for Remicade and steroid taper - follow up taper with Dr. Michail Sermon  3. Follow up with [facility] as scheduled 4. Discharged with pain regimen, he's following with PCP (he notes he has appt 5/20)  Discharge Diagnoses:  Active Problems:   Exacerbation of Crohn's disease ([place])   Bipolar 1 disorder ([place])   PTSD (post-traumatic stress disorder)   Crohn's colitis, with fistula ([facility])   GERD (gastroesophageal reflux disease)   Acute Crohn's disease ([place])  Discharge Condition: stable  Diet recommendation: soft diet  Filed Weights   06/17/19 0151  Weight: 68.9 kg    History of present illness:  Jeffery Richardsonis Jeffery Mcintosh 39 y.o.malewith medical history significant forCrohn's disease with fistula formation, diagnosed at the age of 39 and had been on different regimen, currently followed up by [facility] returns to the ED with severe abdominal pain.He was recently admitted and treated for acute Crohn flareup and discharged on 06/10/2019, CT abdomen at that time showed marked inflammation involving the terminal ileum and cecum, redemonstration of several fistulous connection between the distal segments of the ileum. Patient was seen in the ED on 5/11 with same abdominal pain admission was requested but patient wanted to manage at home and discharged home with po oxy and prednisone.He returns to the ED with diffuse abdominal pain like his previous Crohn's exacerbation along with nausea, but no vomiting.He reports he has not been able to eat much at home, has not had bowel movements in 2 days but passing flatus well. Hemodynamically stable, afebrile, saturating 90% on room air.CXR/AXR:shows few  prominent loops of small bowel in the right abdomen similar to prior study, no obstruction or free intraperitonealAIRand no active cardiopulmonary disease. labs-CMP, normal CBC with mild leukocytosis 11.7.Patient has Rashaun Wichert severe uncontrolled abdominal pain no significant improvement despite IV Dilaudid. Also received methylprednisolone 125 mg IV, normal saline 1 L bolus, Zofran. Due to ongoing symptoms and inability to tolerate p.o., he isbeing admitted for further management  5/17: Seen this morning and when was calm, watching Jeffery Mcintosh show on his computer. He told me that he hadn't had any BM ON and did not have much of an appetite. He stated his pain is still present. I discussed with him next steps for his Crohn's. I told him that I would order Jeffery Mcintosh CT ab/pelvis since his last was on 5/4. If it is worsened, the could be need for surgery to weigh in on Jaedah Lords surgical option. I told him that we would maintain at best FLD during his stay and that he may need to keep at that level at discharge for some time. Let's see what the CT shows. Will speak with surgery if warranted. UPDATE: Reviewed case and imaging with surgery. No surgical intervention warranted at this time. They recommend he remain medically compliant. Called by nursing that patient was requesting more pain medications and advancing diet. He says he's been in 10/10 pain for last 24 hrs. Again, during exam this morning, I had to stop him from watching Nyomi Howser TV program on his computer. He was calm during that time. This is not c/w 10/10 pain. I again explained that we would not advanced past FLD during this stay and that he needed to stay at that level  until he sees his outpt GI doc.  5/18: No acute events ON. Started on remicaide w/ GI. Asleep this morning. No changes for today.  He was admitted for Jeffery Mcintosh crohn's flare.  He improved with IV steroids and remidcade.  Plan for discharge with steroid taper and plan for outpatient remicade.  Follow up with  gastroenterology outpatient with [facility]   Hospital Course:  Acute Exacerbation of Crohn's disease Chronic Crohn's colitis, with fistula in terminal ileus - improved with IV steroids and remicade -> discharged with steroid taper and plans for remicade and GI follow up - diet advanced to soft - needs to follow up with [facility] (Dr. Vanita Ingles) in June as scheduled - 5/16: Had an ok night, but in pain this AM. Let's reduce diet to FLD. D/w GI. May need CT in AM if pain no better. And may also need surgical consult. Will change dilaudid to something more long acting as he won't be on diluadid when he is discharged. He will need to start biologics w/ his new GI doc next month. For now, keep current dosing of prednisone. - 5/17: CT ordered. He has TTP to lower half of abdomen, no fevers, no leukocytosis. If CT warrants, will speak with surgery.     - 5/18: Spoke with surgery about CT results. No surgical intervention at this time. They recommend he stay medically compliant. Started on remicaide by GI. No acute events ON. Continue current tx.   Bipolar 1 disorder Depression PTSD Hx of SI - no SI and mood is stable.  - continue carmabazepine/fluoxetine.  GERD - continue protonix  Tobacco abuse - continue nicotine patch  Normocytic anemia - Hgb stable, no evidence of bleed, monitor  Procedures: none  Consultations:  GI  surgery  Discharge Exam: Vitals:   06/21/19 2119 06/22/19 0544  BP: (!) 122/54 134/68  Pulse: 78 75  Resp: 16 16  Temp: (!) 97.2 F (36.2 C) 98 F (36.7 C)  SpO2: 100% 97%   Feeling better Pain ok Discussed discharge plan  General: No acute distress. Cardiovascular: Heart sounds show Jeffery Mcintosh regular rate, and rhythm Lungs: Clear to auscultation bilaterally  Abdomen: Soft, nontender, nondistended Neurological: Alert and oriented 3. Moves all extremities 4. Cranial nerves II through XII grossly intact. Skin:  Warm and dry. No rashes or lesions. Extremities: No clubbing or cyanosis. No edema.   Discharge Instructions   Discharge Instructions    Call MD for:  difficulty breathing, headache or visual disturbances   Complete by: As directed    Call MD for:  extreme fatigue   Complete by: As directed    Call MD for:  hives   Complete by: As directed    Call MD for:  persistant dizziness or light-headedness   Complete by: As directed    Call MD for:  persistant nausea and vomiting   Complete by: As directed    Call MD for:  redness, tenderness, or signs of infection (pain, swelling, redness, odor or green/yellow discharge around incision site)   Complete by: As directed    Call MD for:  severe uncontrolled pain   Complete by: As directed    Call MD for:  temperature >100.4   Complete by: As directed    DIET SOFT   Complete by: As directed    Diet - low sodium heart healthy   Complete by: As directed    Discharge instructions   Complete by: As directed    You were seen  for Devaney Segers crohn's flare.  You've improved on IV steroids and remicade.  You'll discharge on Lilyann Gravelle steroid taper.  Continue on Madyx Delfin soft diet at discharge.    We're sending you home with pain medications.  You have oxycontin that you'll take twice daily and oxycodone that you will take as needed.  The goal should be to gradually reduce these medications as your pain improves.  Please follow up with your PCP for additional refills.   Please follow up with Dr. Michail Sermon in 3 weeks.  Follow up with your [facility].  Return for new, recurrent, or worsening symptoms.  Please ask your PCP to request records from this hospitalization so they know what was done and what the next steps will be.   Increase activity slowly   Complete by: As directed    Increase activity slowly   Complete by: As directed      Allergies as of 06/22/2019   No Known Allergies     Medication List    STOP taking these medications    oxyCODONE 5 MG immediate release tablet Commonly known as: Oxy IR/ROXICODONE Replaced by: oxyCODONE 15 mg 12 hr tablet   oxyCODONE-acetaminophen 5-325 MG tablet Commonly known as: PERCOCET/ROXICET     TAKE these medications   carbamazepine 100 MG chewable tablet Commonly known as: TEGRETOL Chew 1 tablet (100 mg total) by mouth 2 (two) times daily. For mood stabilization   FLUoxetine 40 MG capsule Commonly known as: PROZAC Take 1 capsule (40 mg total) by mouth daily. For depression   nicotine 21 mg/24hr patch Commonly known as: NICODERM CQ - dosed in mg/24 hours Place 1 patch (21 mg total) onto the skin daily. (May buy from over the counter): For smoking cessation   oxyCODONE 15 mg 12 hr tablet Commonly known as: OXYCONTIN Take 1 tablet (15 mg total) by mouth every 12 (twelve) hours for 2 days. Replaces: oxyCODONE 5 MG immediate release tablet   oxyCODONE 5 MG immediate release tablet Commonly known as: Oxy IR/ROXICODONE Take 1 tablet (5 mg total) by mouth every 6 (six) hours as needed for up to 3 days for moderate pain.   pantoprazole 40 MG tablet Commonly known as: PROTONIX Take 1 tablet (40 mg total) by mouth daily. For acid reflux   predniSONE 20 MG tablet Commonly known as: DELTASONE Take 2 tablets (40 mg total) by mouth daily with breakfast for 7 days, THEN 1.5 tablets (30 mg total) daily with breakfast for 7 days, THEN 1 tablet (20 mg total) daily with breakfast for 7 days. Please follow up with Dr. Michail Sermon for additional adjustments to your prednisone. Start taking on: Jun 23, 2019 What changed: See the new instructions.      No Known Allergies Follow-up Information    Wilford Corner, MD Follow up.   Specialty: Gastroenterology Why: Your follow up appointment with Dr. Michail Sermon is in about 3 weeks, Dr. Michail Sermon will continue your steroid taper (if you are going to run out before your appointment with him, please ask your PCP for Jeffery Mcintosh refill) Contact  information: [address] [place] [address] [phone]            The results of significant diagnostics from this hospitalization (including imaging, microbiology, ancillary and laboratory) are listed below for reference.    Significant Diagnostic Studies: DG Abd 1 View  Result Date: 06/09/2019 CLINICAL DATA:  Abdominal pain EXAM: ABDOMEN - 1 VIEW COMPARISON:  Jun 08, 2019 FINDINGS: There  is residual contrast in the colon. There is no bowel dilatation or air-fluid level to suggest bowel obstruction. No free air. Lung bases are clear. IMPRESSION: No evident bowel obstruction or free air.  Lung bases clear. Electronically Signed   By: Lowella Grip III M.D.   On: 06/09/2019 07:56   DG Abd 1 View  Result Date: 06/08/2019 CLINICAL DATA:  Small bowel obstruction EXAM: ABDOMEN - 1 VIEW COMPARISON:  Abdominal CT from yesterday FINDINGS: Oral contrast is now distributed throughout the nondilated colon. There are Corvette Mcintosh few superimposed distended small bowel loops measuring up to 3 cm in diameter. No concerning mass effect or gas collection. IMPRESSION: 1. Progression of oral contrast into the nondilated colon. 2. Mildly distended small bowel loops in the central abdomen that are similar to yesterday. Electronically Signed   By: Monte Fantasia M.D.   On: 06/08/2019 05:26   CT ABDOMEN PELVIS W CONTRAST  Result Date: 06/20/2019 CLINICAL DATA:  Crohn's exacerbation. EXAM: CT ABDOMEN AND PELVIS WITH CONTRAST TECHNIQUE: Multidetector CT imaging of the abdomen and pelvis was performed using the standard protocol following bolus administration of intravenous contrast. CONTRAST:  18m OMNIPAQUE IOHEXOL 300 MG/ML  SOLN COMPARISON:  Jun 07, 2019. FINDINGS: Lower chest: No acute abnormality. Hepatobiliary: No focal liver abnormality is seen. No gallstones, gallbladder wall thickening, or biliary dilatation. Pancreas: Unremarkable. No pancreatic ductal dilatation or surrounding inflammatory  changes. Spleen: Normal in size without focal abnormality. Adrenals/Urinary Tract: Adrenal glands are unremarkable. Kidneys are normal, without renal calculi, focal lesion, or hydronephrosis. Bladder is unremarkable. Stomach/Bowel: The stomach appears normal. There is again noted wall thickening involving the terminal ileum and cecum consistent with Crohn's disease. Mild inflammatory changes are noted suggesting some component of acute inflammation. Once again, there are multiple fistula noted involving the terminal ileum. The appendix is visualized but does not appear to be acutely inflamed. Vascular/Lymphatic: No significant vascular findings are present. No enlarged abdominal or pelvic lymph nodes. Reproductive: Prostate is unremarkable. Other: Small amount of free fluid is noted in the pelvis most likely related to Crohn's disease. No hernia is noted. Musculoskeletal: No acute or significant osseous findings. IMPRESSION: 1. There is again noted wall thickening involving the terminal ileum and cecum consistent with Crohn's disease. Mild inflammatory changes are noted suggesting some component of acute inflammation. Once again, there are multiple fistula noted involving the terminal ileum. Small amount of free fluid is noted in the pelvis most likely related to Crohn's disease. Electronically Signed   By: JMarijo ConceptionM.D.   On: 06/20/2019 12:31   CT ABDOMEN PELVIS W CONTRAST  Result Date: 06/07/2019 CLINICAL DATA:  History of Crohn's disease, concern for Crohn's flare, nausea and generalized abdominal pain for 2 days EXAM: CT ABDOMEN AND PELVIS WITH CONTRAST TECHNIQUE: Multidetector CT imaging of the abdomen and pelvis was performed using the standard protocol following bolus administration of intravenous contrast. CONTRAST:  1068mOMNIPAQUE IOHEXOL 300 MG/ML  SOLN COMPARISON:  CT 05/18/2019, radiograph 05/20/2019 FINDINGS: Lower chest: Lung bases are clear. Normal heart size. No pericardial effusion.  Hepatobiliary: No focal liver abnormality is seen. No gallstones, gallbladder wall thickening, or biliary dilatation. Pancreas: Unremarkable. No pancreatic ductal dilatation or surrounding inflammatory changes. Spleen: Normal in size without focal abnormality. Adrenals/Urinary Tract: Adrenal glands are unremarkable. Kidneys are normal, without renal calculi, focal lesion, or hydronephrosis. Bladder is unremarkable. Stomach/Bowel: Distal esophagus, stomach and duodenum are unremarkable. There is marked inflammation involving the terminal ileum and cecum centered upon the ileocecal valve.  Redemonstration of several fistulous connections between the distal segments of the ileum and terminal ileum (5/59, 64). No extraluminal air or fluid. The appendix arises from the cecal tip and courses directly towards the inflamed loops of bowel.Borderline upstream dilatation with several small bowel loops measuring up to 3 cm in diameter with layering air-fluid levels may reflect Jeffery Mcintosh degree of resulting obstruction. Jeffery Mcintosh separate loop of circumferential thickening is seen in the mid abdomen (2/38) which could reflect Jeffery Mcintosh second site of inflammation. Some mild thickening in Jeffery Mcintosh region reactive though inflammatory tethering of the appendix in this vicinity. Distal colon is unremarkable. Vascular/Lymphatic: The aorta is normal caliber. Some reactive adenopathy present in the right lower quadrant. No pathologically enlarged abdominopelvic nodes. Reproductive: The prostate and seminal vesicles are unremarkable. Other: No free abdominopelvic fluid or gas. No bowel containing hernias. Musculoskeletal: No acute osseous abnormality or suspicious osseous lesion. Mild degenerative changes in both hips. IMPRESSION: 1. Marked inflammation involving the terminal ileum and cecum centered upon the ileocecal valve compatible with active Crohn's flare. Redemonstration of several fistulous connections between the distal segments of the ileum. The appendix  arises from the cecal tip and courses directly towards the inflamed loops of bowel with possible inflammatory tethering and some likely reactive thickening. 2. Mild air and fluid distention of the upstream loops of small bowel may reflect Kailynn Satterly degree of low-grade or partial small bowel obstruction. 3. Separate focal segment of mural thickening in the mid abdomen could reflect underdistention or Jeffery Mcintosh second site of inflammation (2/38). Electronically Signed   By: Lovena Le M.D.   On: 06/07/2019 01:16   DG ABD ACUTE 2+V W 1V CHEST  Result Date: 06/17/2019 CLINICAL DATA:  Right-sided abdominal pain. History of Crohn's disease. EXAM: DG ABDOMEN ACUTE W/ 1V CHEST COMPARISON:  Abdominal x-ray dated Jun 09, 2019. FINDINGS: There are few prominent loops of small bowel in the right abdomen, similar to prior study. No obstruction or free intraperitoneal air. No radiopaque calculi or other significant radiographic abnormality is seen. Heart size and mediastinal contours are within normal limits. Both lungs are clear. IMPRESSION: 1. Unchanged prominent loops of small bowel in the right abdomen. No obstruction. 2.  No active cardiopulmonary disease. Electronically Signed   By: Titus Dubin M.D.   On: 06/17/2019 09:14    Microbiology: Recent Results (from the past 240 hour(s))  SARS Coronavirus 2 by RT PCR (hospital order, performed in [facility] hospital lab) Nasopharyngeal Nasopharyngeal Swab     Status: None   Collection Time: 06/17/19 10:18 AM   Specimen: Nasopharyngeal Swab  Result Value Ref Range Status   SARS Coronavirus 2 NEGATIVE NEGATIVE Final    Comment: (NOTE) SARS-CoV-2 target nucleic acids are NOT DETECTED. The SARS-CoV-2 RNA is generally detectable in upper and lower respiratory specimens during the acute phase of infection. The lowest concentration of SARS-CoV-2 viral copies this assay can detect is 250 copies / mL. Vir [place] negative result does not preclude SARS-CoV-2 infection and should not be used  as the sole basis for treatment or other patient management decisions.  Lamaria Hildebrandt negative result may occur with improper specimen collection / handling, submission of specimen other than nasopharyngeal swab, presence of viral mutation(s) within the areas targeted by this assay, and inadequate number of viral copies (<250 copies / mL). Gomer France negative result must be combined with clinical observations, patient history, and epidemiological information. Fact Sheet for Patients:   StrictlyIdeas.no Fact Sheet for Healthcare Providers: BankingDealers.co.za This test is not yet approved or cleared  by the [place] and has been authorized for detection and/or diagnosis of SARS-CoV-2 by FDA under an Emergency Use Authorization (EUA).  This EUA will remain in effect (meaning this test can be used) for the duration of the COVID-19 declaration under Section 564(b)(1) of the Act, 21 U.S.C. section 360bbb-3(b)(1), unless the authorization is terminated or revoked sooner. Performed at [facility], [place] [address], [place], [place] [address]      Labs: Basic Metabolic Panel: Recent Labs  Lab 06/18/19 0436 06/19/19 0438 06/20/19 0322 06/21/19 0715 06/22/19 0426  NA 137 141 144 141 140  K 5.0 3.5 4.1 3.8 4.0  CL 104 100 105 101 100  CO2 27 31 30 29 30   GLUCOSE 111* 64* 89 89 96  BUN 9 10 8 7 13   CREATININE 0.65 0.80 0.80 0.83 0.82  CALCIUM 8.7* 8.9 9.0 8.8* 8.8*  MG  --  2.0 1.9  --  2.0  PHOS  --  3.7 4.4  --   --    Liver Function Tests: Recent Labs  Lab 06/17/19 0712 06/17/19 0712 06/18/19 0436 06/19/19 0438 06/20/19 0322 06/21/19 0715 06/22/19 0426  AST 14*  --  25  --   --  12* 12*  ALT 26  --  21  --   --  33 29  ALKPHOS 84  --  68  --   --  73 83  BILITOT 0.5  --  0.8  --   --  0.5 0.2*  PROT 6.8  --  6.0*  --   --  5.8* 5.9*  ALBUMIN 3.8   < > 3.4* 3.8 3.3* 3.2* 3.3*   < > = values in this interval not  displayed.   Recent Labs  Lab 06/17/19 0712  LIPASE 29   No results for input(s): AMMONIA in the last 168 hours. CBC: Recent Labs  Lab 06/18/19 0436 06/19/19 0438 06/20/19 0322 06/21/19 0715 06/22/19 0426  WBC 13.3* 12.4* 8.1 9.5 9.6  NEUTROABS  --  9.3* 4.7 6.1 6.5  HGB 11.8* 12.6* 12.0* 11.9* 12.3*  HCT 35.9* 37.7* 35.8* 35.0* 36.6*  MCV 91.8 90.8 90.6 90.0 89.5  PLT 347 331 350 319 347   Cardiac Enzymes: No results for input(s): CKTOTAL, CKMB, CKMBINDEX, TROPONINI in the last 168 hours. BNP: BNP (last 3 results) No results for input(s): BNP in the last 8760 hours.  ProBNP (last 3 results) No results for input(s): PROBNP in the last 8760 hours.  CBG: No results for input(s): GLUCAP in the last 168 hours.     Signed:  Fayrene Helper MD.  [facility] 06/22/2019, 11:54 AM

## 2019-06-23 ENCOUNTER — Telehealth (INDEPENDENT_AMBULATORY_CARE_PROVIDER_SITE_OTHER): Payer: Self-pay

## 2019-06-23 ENCOUNTER — Other Ambulatory Visit (INDEPENDENT_AMBULATORY_CARE_PROVIDER_SITE_OTHER): Payer: Self-pay | Admitting: Primary Care

## 2019-06-23 ENCOUNTER — Telehealth (INDEPENDENT_AMBULATORY_CARE_PROVIDER_SITE_OTHER): Payer: Medicare Other | Admitting: Primary Care

## 2019-06-23 ENCOUNTER — Other Ambulatory Visit: Payer: Self-pay

## 2019-06-23 DIAGNOSIS — K50112 Crohn's disease of large intestine with intestinal obstruction: Secondary | ICD-10-CM

## 2019-06-23 DIAGNOSIS — K219 Gastro-esophageal reflux disease without esophagitis: Secondary | ICD-10-CM | POA: Diagnosis not present

## 2019-06-23 DIAGNOSIS — Z09 Encounter for follow-up examination after completed treatment for conditions other than malignant neoplasm: Secondary | ICD-10-CM

## 2019-06-23 DIAGNOSIS — K50113 Crohn's disease of large intestine with fistula: Secondary | ICD-10-CM

## 2019-06-23 DIAGNOSIS — Z7689 Persons encountering health services in other specified circumstances: Secondary | ICD-10-CM | POA: Diagnosis not present

## 2019-06-23 DIAGNOSIS — G894 Chronic pain syndrome: Secondary | ICD-10-CM

## 2019-06-23 MED ORDER — OXYCODONE HCL 5 MG PO CAPS: 5.0000 mg | ORAL_CAPSULE | Freq: Four times a day (QID) | ORAL | 0 refills | Status: DC | PRN

## 2019-06-23 MED ORDER — OXYCODONE HCL 5 MG PO TABS: 5.0000 mg | ORAL_TABLET | Freq: Four times a day (QID) | ORAL | 0 refills | Status: DC | PRN

## 2019-06-23 NOTE — Progress Notes (Signed)
Virtual Visit via Telephone Note  I connected with Jeffery Mcintosh on 06/23/19 at  9:30 AM EDT by telephone and verified that I am speaking with the correct person using two identifiers.   I discussed the limitations, risks, security and privacy concerns of performing an evaluation and management service by telephone and the availability of in person appointments. I also discussed with the patient that there may be a patient responsible charge related to this service. The patient expressed understanding and agreed to proceed.   History of Present Illness: Jeffery Mcintosh 39 year old African American male having a tele visit for hospital follow up. He has a  Significant history  for Crohn's disease with fistula formation, diagnosed at the age of 43 and had been on different regimen, currently followed up by [facility]. Presented to the emergency room on 06/07/2019 severe abdominal pain. He is also establishing care with PCP. Patient reports still in a lot of discomfort he is on the waiting list for sooner appointment but is schedule on June 22,2021. Past Medical History:  Diagnosis Date  . Anxiety   . Arthritis   . Bipolar affective ([place])   . Chronic headaches   . Crohn's disease ([place]) history of  . Depression   . GERD (gastroesophageal reflux disease)   . Hypertension   . Noncompliance 04/17/2018  . PTSD (post-traumatic stress disorder) 04/17/2018  . Schizophrenic disorder ([place])    Current Outpatient Medications on File Prior to Visit  Medication Sig Dispense Refill  . carbamazepine (TEGRETOL) 100 MG chewable tablet Chew 1 tablet (100 mg total) by mouth 2 (two) times daily. For mood stabilization 60 tablet 0  . FLUoxetine (PROZAC) 40 MG capsule Take 1 capsule (40 mg total) by mouth daily. For depression 30 capsule 0  . oxyCODONE (OXY IR/ROXICODONE) 5 MG immediate release tablet Take 1 tablet (5 mg total) by mouth every 6 (six) hours as needed for up to 3 days for moderate pain.  12 tablet 0  . oxyCODONE (OXYCONTIN) 15 mg 12 hr tablet Take 1 tablet (15 mg total) by mouth every 12 (twelve) hours for 2 days. 4 tablet 0  . pantoprazole (PROTONIX) 40 MG tablet Take 1 tablet (40 mg total) by mouth daily. For acid reflux 30 tablet 0  . predniSONE (DELTASONE) 20 MG tablet Take 2 tablets (40 mg total) by mouth daily with breakfast for 7 days, THEN 1.5 tablets (30 mg total) daily with breakfast for 7 days, THEN 1 tablet (20 mg total) daily with breakfast for 7 days. Please follow up with Dr. Michail Sermon for additional adjustments to your prednisone. 31.5 tablet 0  . nicotine (NICODERM CQ - DOSED IN MG/24 HOURS) 21 mg/24hr patch Place 1 patch (21 mg total) onto the skin daily. (May buy from over the counter): For smoking cessation (Patient not taking: Reported on 06/17/2019) 28 patch 0  . [DISCONTINUED] omeprazole (PRILOSEC) 40 MG capsule Take 1 capsule (40 mg total) by mouth daily. (Patient not taking: Reported on 01/18/2018) 30 capsule 3  . [DISCONTINUED] sucralfate (CARAFATE) 1 g tablet Take 1 tablet (1 g total) by mouth 4 (four) times daily -  with meals and at bedtime. (Patient not taking: Reported on 04/17/2018) 30 tablet 0   No current facility-administered medications on file prior to visit.    Observations/Objective: Review of Systems  Gastrointestinal: Positive for abdominal pain, constipation and nausea.  All other systems reviewed and are negative.  Assessment and Plan: Jeffery Mcintosh was seen today for hospitalization  follow-up and medication refill.  Diagnoses and all orders for this visit:  Crohn's colitis, with intestinal obstruction ([place]) Followed by GI appointment schedule out patient is in pain. Reviewed PMR 300 narcotics prescribed by hospital physicians at discharge. Explain this is a 1 time prescription for pain medication and keep appointment with GI  Gastroesophageal reflux disease without esophagitis Discussed eating small frequent meal, reduction in acidic foods,  fried foods ,spicy foods, alcohol caffeine and tobacco and certain medications. Avoid laying down after eating 15mns-1hour, elevated head of the bed. [facility] Recommendations for Outpatient Follow-up:  1. Follow outpatient CBC/CMP  2. Follow outpatient with [facility] for Remicade and steroid taper - follow up taper with Dr. SMichail Sermon 3. [facility] 4. Discharged with pain regimen, he's following with PCP (he notes he has appt 5/20)  Encounter to establish care MJuluis Mire NP-C will be your  (PCP) she is mastered prepared . She is skilled to diagnosed and treat illness. Also able to answer health concern as well as continuing care of varied medical conditions, not limited by cause, organ system, or diagnosis.   Chronic pain syndrome Secondary to recurrent crohn flare up causing readmissions   Follow Up Instructions:    I discussed the assessment and treatment plan with the patient. The patient was provided an opportunity to ask questions and all were answered. The patient agreed with the plan and demonstrated an understanding of the instructions.   The patient was advised to call back or seek an in-person evaluation if the symptoms worsen or if the condition fails to improve as anticipated.  I provided 22 minutes of non-face-to-face time during this encounter. Review encounters, labs and imaging   MKerin Perna NP

## 2019-06-23 NOTE — Telephone Encounter (Signed)
I connected with  Jeffery Mcintosh on 06/23/19 by a video enabled telemedicine application and verified that I am speaking with the correct person using two identifiers.   I discussed the limitations of evaluation and management by telemedicine. The patient expressed understanding and agreed to proceed.  Nat Christen, CMA

## 2019-06-29 ENCOUNTER — Encounter (HOSPITAL_COMMUNITY): Payer: Self-pay | Admitting: Emergency Medicine

## 2019-06-29 ENCOUNTER — Other Ambulatory Visit: Payer: Self-pay

## 2019-06-29 DIAGNOSIS — K5090 Crohn's disease, unspecified, without complications: Secondary | ICD-10-CM | POA: Insufficient documentation

## 2019-06-29 DIAGNOSIS — Z87891 Personal history of nicotine dependence: Secondary | ICD-10-CM | POA: Insufficient documentation

## 2019-06-29 DIAGNOSIS — I10 Essential (primary) hypertension: Secondary | ICD-10-CM | POA: Insufficient documentation

## 2019-06-29 DIAGNOSIS — Z79899 Other long term (current) drug therapy: Secondary | ICD-10-CM | POA: Diagnosis not present

## 2019-06-29 DIAGNOSIS — R1031 Right lower quadrant pain: Secondary | ICD-10-CM | POA: Diagnosis present

## 2019-06-29 NOTE — ED Triage Notes (Signed)
Patient c/o abdominal pain x3 days with blood in stool. Hx chron's. Denies vomiting.

## 2019-06-30 ENCOUNTER — Emergency Department (HOSPITAL_COMMUNITY): Payer: Medicare Other

## 2019-06-30 ENCOUNTER — Emergency Department (HOSPITAL_COMMUNITY)
Admission: EM | Admit: 2019-06-30 | Discharge: 2019-06-30 | Disposition: A | Discharge status: Home / Self Care | Payer: Medicare Other | Attending: Emergency Medicine | Admitting: Emergency Medicine

## 2019-06-30 DIAGNOSIS — R1030 Lower abdominal pain, unspecified: Secondary | ICD-10-CM

## 2019-06-30 DIAGNOSIS — R1031 Right lower quadrant pain: Secondary | ICD-10-CM | POA: Diagnosis not present

## 2019-06-30 LAB — URINALYSIS, ROUTINE W REFLEX MICROSCOPIC
Bilirubin Urine: NEGATIVE
Glucose, UA: 150 mg/dL — AB
Hgb urine dipstick: NEGATIVE
Ketones, ur: NEGATIVE mg/dL
Leukocytes,Ua: NEGATIVE
Nitrite: NEGATIVE
Protein, ur: NEGATIVE mg/dL
Specific Gravity, Urine: 1.015 (ref 1.005–1.030)
pH: 5 (ref 5.0–8.0)

## 2019-06-30 LAB — COMPREHENSIVE METABOLIC PANEL
ALT: 37 U/L (ref 0–44)
AST: 21 U/L (ref 15–41)
Albumin: 3.9 g/dL (ref 3.5–5.0)
Alkaline Phosphatase: 89 U/L (ref 38–126)
Anion gap: 15 (ref 5–15)
BUN: 17 mg/dL (ref 6–20)
CO2: 23 mmol/L (ref 22–32)
Calcium: 9 mg/dL (ref 8.9–10.3)
Chloride: 99 mmol/L (ref 98–111)
Creatinine, Ser: 0.86 mg/dL (ref 0.61–1.24)
GFR calc Af Amer: >60 mL/min (ref >60)
GFR calc non Af Amer: >60 mL/min (ref >60)
Glucose, Bld: 260 mg/dL — ABNORMAL HIGH (ref 70–99)
Potassium: 4.3 mmol/L (ref 3.5–5.1)
Sodium: 137 mmol/L (ref 135–145)
Total Bilirubin: 0.6 mg/dL (ref 0.3–1.2)
Total Protein: 7 g/dL (ref 6.5–8.1)

## 2019-06-30 LAB — CBC
HCT: 40.9 % (ref 39.0–52.0)
Hemoglobin: 13.9 g/dL (ref 13.0–17.0)
MCH: 30.4 pg (ref 26.0–34.0)
MCHC: 34 g/dL (ref 30.0–36.0)
MCV: 89.5 fL (ref 80.0–100.0)
Platelets: 327 10*3/uL (ref 150–400)
RBC: 4.57 MIL/uL (ref 4.22–5.81)
RDW: 14.7 % (ref 11.5–15.5)
WBC: 14.3 10*3/uL — ABNORMAL HIGH (ref 4.0–10.5)
nRBC: 0 % (ref 0.0–0.2)

## 2019-06-30 LAB — LIPASE, BLOOD: Lipase: 34 U/L (ref 11–51)

## 2019-06-30 MED ORDER — OXYCODONE HCL 5 MG PO TABS: 5.0000 mg | ORAL_TABLET | ORAL | 0 refills | Status: DC | PRN

## 2019-06-30 MED ORDER — OXYCODONE-ACETAMINOPHEN 5-325 MG PO TABS
2.0000 | ORAL_TABLET | Freq: Once | ORAL | Status: AC
  Administered 2019-06-30: 2 via ORAL
  Filled 2019-06-30: qty 2

## 2019-06-30 MED ORDER — ONDANSETRON HCL 4 MG/2ML IJ SOLN
4.0000 mg | Freq: Once | INTRAMUSCULAR | Status: AC
  Administered 2019-06-30: 4 mg via INTRAVENOUS
  Filled 2019-06-30: qty 2

## 2019-06-30 MED ORDER — HYDROMORPHONE HCL 1 MG/ML IJ SOLN
0.5000 mg | Freq: Once | INTRAMUSCULAR | Status: AC
  Administered 2019-06-30: 0.5 mg via INTRAVENOUS
  Filled 2019-06-30: qty 1

## 2019-06-30 MED ORDER — SODIUM CHLORIDE (PF) 0.9 % IJ SOLN
INTRAMUSCULAR | Status: AC
  Filled 2019-06-30: qty 50

## 2019-06-30 MED ORDER — IOHEXOL 300 MG/ML  SOLN
100.0000 mL | Freq: Once | INTRAMUSCULAR | Status: AC | PRN
  Administered 2019-06-30: 100 mL via INTRAVENOUS

## 2019-06-30 MED ORDER — HYDROMORPHONE HCL 1 MG/ML IJ SOLN
1.0000 mg | Freq: Once | INTRAMUSCULAR | Status: AC
  Administered 2019-06-30: 1 mg via INTRAVENOUS
  Filled 2019-06-30: qty 1

## 2019-06-30 MED ORDER — DICYCLOMINE HCL 20 MG PO TABS
20.0000 mg | ORAL_TABLET | Freq: Four times a day (QID) | ORAL | 0 refills | Status: DC | PRN
Start: 2019-06-30 — End: 2022-05-13

## 2019-06-30 MED ORDER — CELECOXIB 200 MG PO CAPS: 200.0000 mg | ORAL_CAPSULE | Freq: Two times a day (BID) | ORAL | 0 refills | Status: DC

## 2019-06-30 NOTE — ED Provider Notes (Signed)
6:48 AM Patient taken in sign out from [facility]. Hx of crohns  Multiple hospitalizations.  Awaiting CT scan.   Patient with continued pain.  I have given the patient another dose of IV Dilaudid.  I was able to speak with the physician on call with GI service through the [facility] line at [facility].  He states that he will try to get the patient a closer date but it recommends that he follows closely with his current GI specialist Dr.Badreddine.   I spoke with Dr. Hurman Horn of [facility] specialist.  I got the patient a follow-up appointment on Wednesday of the next week.  At discharge the patient became very upset stating that he was still in 10 out of 10 pain.  He states that he has already used all of his 5 mg oxycodones because when he was hospitalized he was getting 15 mg extended release oxycodone with 5 mg for basic breakthrough pain and his pain is not controlled.  I personally reviewed the patient's CT scan and it is unchanged from his previous admission shows no active inflammatory process.  I reviewed these images as well with Dr. Regenia Skeeter and we both agree that we will try multi modal pain control approach in the outpatient setting.  The patient was very upset by this feeling that he needed admission as he feels he cannot control his pain in the outpatient setting.  However the patient is not having active vomiting.  Is able to hold down foods and fluid and has a very close follow-up appointment.  I tried to explain this to the patient.  After review of literature it appears that Celebrex is likely not harmful in the setting of Crohn's disease.  We will also give him [employer] and I have given him a prescription for oxycodone which he may fill when he is allowed to by state law.  Patient is safe for discharge at this time.  Discussed return precautions with the patient.  I did try to answer all the questions he had to the best of my ability however he made it very clear that he was unhappy with not  being admitted.     Margarita Mail, PA-C 06/30/19 1613    Sherwood Gambler, MD 07/01/19 [phone]

## 2019-06-30 NOTE — Discharge Instructions (Signed)
Contact a health care provider if: You have diarrhea, cramps in your abdomen, and other GI problems that are present almost all the time. Your symptoms do not improve with treatment. You continue to lose weight. You develop a rash or sores on your skin. You develop eye problems. You have a fever. Your symptoms get worse. You develop new symptoms. Get help right away if: You have bloody diarrhea. You have severe pain in your abdomen. You cannot pass stools.

## 2019-06-30 NOTE — ED Notes (Signed)
Ice chips given

## 2019-06-30 NOTE — ED Provider Notes (Signed)
[facility] DEPT Provider Note   CSN: [id number] Arrival date & time: 06/29/19  2212     History Chief Complaint  Patient presents with  . Abdominal Pain    Jeffery Mcintosh is a 39 y.o. male.  40 y.o male with a PMH of [place] presents to the ED with a chief complaint of lower abdominal pain x 2 days. Describes this as a constant pain to the right lower quadrant, reports pain feels exacerbated in this past couple of days.  States he is endorsed a couple episodes of nausea but has not had any vomiting.  He was recently discharged from the hospital about a week ago with a regimen of steroids, he is currently taking 60 mg of prednisone daily.  Patient reports he also noted blood in his stool, has a pain regimen consisting of oxycodone 5 mg every 4-6 hours which he has been taking without any symptomatic relief.  Denies any fevers, chest pain, shortness of breath, urinary symptoms.  Of note, patient does have an appointment with [facility] but this is not until July.  The history is provided by the patient and medical records.       Past Medical History:  Diagnosis Date  . Anxiety   . Arthritis   . Bipolar affective ([place])   . Chronic headaches   . Crohn's disease ([place]) history of  . Depression   . GERD (gastroesophageal reflux disease)   . Hypertension   . Noncompliance 04/17/2018  . PTSD (post-traumatic stress disorder) 04/17/2018  . Schizophrenic disorder [facility])     Patient Active Problem List   Diagnosis Date Noted  . Acute Crohn's disease ([place]) 06/17/2019  . GERD (gastroesophageal reflux disease) 06/07/2019  . 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 Crohn's colitis ([place]) 05/20/2019  . Suicidal ideation 05/19/2019  . Crohn's colitis, with fistula ([place]) 05/19/2019  . Crohn's colitis, with intestinal obstruction ([place]) 10/11/2018  . Crohn's disease of colon with intestinal obstruction ([place])     . Noncompliance 04/17/2018  . PTSD (post-traumatic stress disorder) 04/17/2018  . Crohn's disease of jejunum with intestinal obstruction ([place]) 10/26/2017  . SBO (small bowel obstruction) ([place]) 10/26/2017  . Bipolar 1 disorder ([place]) 10/26/2017  . Crohn's colitis, unspecified complication ([place]) 78/67/6720  . Epigastric burning sensation 01/03/2015  . Numbness and tingling in left arm 11/15/2014  . Exacerbation of Crohn's disease ([place]) 10/21/2014  . Crohn's disease ([place]) 10/21/2014  . Diarrhea 10/21/2014  . Abdominal pain   . Gout 04/18/2013  . Cubital tunnel syndrome 04/18/2013  . Abnormal urinalysis 04/18/2013    Past Surgical History:  Procedure Laterality Date  . NO PAST SURGERIES         Family History  Problem Relation Age of Onset  . Hypertension Mother   . Heart attack Mother   . Stroke Mother   . Alcohol abuse Father   . Mental illness Father   . Multiple sclerosis Father   . Arthritis Maternal Grandmother   . Hypertension Maternal Grandmother   . Heart disease Maternal Grandmother   . Kidney disease Maternal Grandmother   . Diabetes Maternal Grandmother   . Cancer Maternal Grandmother        great, type unknown  . Hyperlipidemia Maternal Grandfather   . Colon polyps Maternal Grandfather   . Stroke Paternal Grandmother   . Ulcerative colitis Maternal Aunt     Social History   Tobacco Use  . Smoking status: Former  Smoker    Packs/day: 0.50    Years: 15.00    Pack years: 7.50    Types: Cigars  . Smokeless tobacco: Never Used  Substance Use Topics  . Alcohol use: No    Alcohol/week: 0.0 standard drinks  . Drug use: No    Home Medications Prior to Admission medications   Medication Sig Start Date End Date Taking? Authorizing Provider  carbamazepine (TEGRETOL) 100 MG chewable tablet Chew 1 tablet (100 mg total) by mouth 2 (two) times daily. For mood stabilization 05/29/19   Lindell Spar I, NP  FLUoxetine (PROZAC) 40 MG capsule Take 1 capsule (40 mg  total) by mouth daily. For depression 05/30/19   Lindell Spar I, NP  nicotine (NICODERM CQ - DOSED IN MG/24 HOURS) 21 mg/24hr patch Place 1 patch (21 mg total) onto the skin daily. (May buy from over the counter): For smoking cessation Patient not taking: Reported on 06/17/2019 05/30/19   Lindell Spar I, NP  oxyCODONE (OXY IR/ROXICODONE) 5 MG immediate release tablet Take 1 tablet (5 mg total) by mouth every 6 (six) hours as needed for severe pain. 06/23/19   Kerin Perna, NP  pantoprazole (PROTONIX) 40 MG tablet Take 1 tablet (40 mg total) by mouth daily. For acid reflux 06/10/19 07/10/19  Arrien, Jimmy Picket, MD  predniSONE (DELTASONE) 20 MG tablet Take 2 tablets (40 mg total) by mouth daily with breakfast for 7 days, THEN 1.5 tablets (30 mg total) daily with breakfast for 7 days, THEN 1 tablet (20 mg total) daily with breakfast for 7 days. Please follow up with Dr. Michail Sermon for additional adjustments to your prednisone. 06/23/19 07/14/19  Elodia Florence., MD  omeprazole (PRILOSEC) 40 MG capsule Take 1 capsule (40 mg total) by mouth daily. Patient not taking: Reported on 01/18/2018 10/17/16 08/07/18  Nita Sells, MD  sucralfate (CARAFATE) 1 g tablet Take 1 tablet (1 g total) by mouth 4 (four) times daily -  with meals and at bedtime. Patient not taking: Reported on 04/17/2018 01/18/18 08/07/18  Varney Biles, MD    Allergies    Patient has no known allergies.  Review of Systems   Review of Systems  Constitutional: Negative for fever.  HENT: Negative for sore throat.   Respiratory: Negative for shortness of breath.   Cardiovascular: Negative for chest pain.  Gastrointestinal: Positive for abdominal pain and nausea. Negative for vomiting.  Genitourinary: Negative for flank pain.  Musculoskeletal: Negative for back pain.  Neurological: Negative for light-headedness and headaches.  All other systems reviewed and are negative.   Physical Exam Updated Vital Signs BP (!) 145/97    Pulse 90   Temp 98.8 F (37.1 C) (Oral)   Resp 16   SpO2 99%   Physical Exam Vitals and nursing note reviewed.  Constitutional:      Appearance: He is well-developed. He is not ill-appearing or toxic-appearing.  HENT:     Head: Normocephalic and atraumatic.  Cardiovascular:     Rate and Rhythm: Normal rate.  Pulmonary:     Effort: Pulmonary effort is normal.     Breath sounds: No wheezing or rales.  Abdominal:     General: Abdomen is flat. Bowel sounds are normal.     Palpations: Abdomen is soft.     Tenderness: There is abdominal tenderness in the right lower quadrant and suprapubic area. There is no right CVA tenderness, left CVA tenderness, guarding or rebound.     Hernia: No hernia is present.  Skin:  General: Skin is warm and dry.  Neurological:     Mental Status: He is alert.     ED Results / Procedures / Treatments   Labs (all labs ordered are listed, but only abnormal results are displayed) Labs Reviewed  COMPREHENSIVE METABOLIC PANEL - Abnormal; Notable for the following components:      Result Value   Glucose, Bld 260 (*)    All other components within normal limits  CBC - Abnormal; Notable for the following components:   WBC 14.3 (*)    All other components within normal limits  URINALYSIS, ROUTINE W REFLEX MICROSCOPIC - Abnormal; Notable for the following components:   Glucose, UA 150 (*)    All other components within normal limits  LIPASE, BLOOD  POC OCCULT BLOOD, ED    EKG None  Radiology DG Abdomen Acute W/Chest  Result Date: 06/30/2019 CLINICAL DATA:  Crohn's disease, diarrhea for 3 days EXAM: DG ABDOMEN ACUTE W/ 1V CHEST COMPARISON:  Acute abdominal series 06/17/2019, CT 06/20/2019 FINDINGS: No consolidation, features of edema, pneumothorax, or effusion. The cardiomediastinal contours are unremarkable. No evidence of subdiaphragmatic free air. Several air filled loops of bowel seen in the mid abdomen may reflect distended small bowel with  layering air-fluid levels concerning for developing obstruction. No pneumatosis or portal venous gas. No suspicious calcifications. No acute osseous or other soft tissue abnormality. IMPRESSION: 1. Several air filled loops of bowel in the mid abdomen may reflect distended small bowel with layering air-fluid levels concerning for developing obstruction. 2. No active cardiopulmonary disease. Electronically Signed   By: Lovena Le M.D.   On: 06/30/2019 06:24    Procedures Procedures (including critical care time)  Medications Ordered in ED Medications  HYDROmorphone (DILAUDID) injection 1 mg (1 mg Intravenous Given 06/30/19 0603)  ondansetron (ZOFRAN) injection 4 mg (4 mg Intravenous Given 06/30/19 9767)    ED Course  I have reviewed the triage vital signs and the nursing notes.  Pertinent labs & imaging results that were available during my care of the patient were reviewed by me and considered in my medical decision making (see chart for details).    MDM Rules/Calculators/A&P   Patient with a long history of Crohn's disorder presents to the ED with complaints of lower abdominal pain for the past 2 days.  Recently discharged from the hospital about a week ago, he was discharged with a prednisone regimen of 60 mg daily, he reports compliance with the medication.  He also has hydrocodone 76m every 4-6 hours for symptomatic control.  He has been taking medication without improvement in his symptoms.  Reports he noted right blood in his stool yesterday.  He has not been able to control with pain regimen.  During evaluation and appears very uncomfortable, there is guarding on my exam, abdomen does appear soft, significant tender to palpation along the right > left quadrant.  Bowel sounds are diminished.  Is are clear to auscultation, there is no pain with palpation of his chest.  CBC interpreted by me is remarkable for leukocytosis of 14.3, patient has been on chronic steroids now for approximately 1  week.  His lipase level is within normal limits.  CMP with no electrolyte derangement, glucose is elevated at 260, no prior history of diabetes but has been on long steroids for his Crohn's disease.  UA without any signs of nitrates, leukocytes or infection. Patient was given 1 mg of Dilaudid for pain control along with Zofran.  Discussed risks and benefits of  obtaining a 2nd CT as patients' last CT abdomen is from May 17, approximately 10 days ago.  Prior CT from 06/20/2019: 1. There is again noted wall thickening involving the terminal ileum and cecum consistent with Crohn's disease. Mild inflammatory changes are noted suggesting some component of acute inflammation. Once again, there are multiple fistula noted involving the terminal ileum. Small amount of free fluid is noted in the pelvis most likely related to Crohn's disease.  We will forego CT at this time and obtain an abdomen evaluation.  DG Abdomen acute showed:  1. Several air filled loops of bowel in the mid abdomen may reflect  distended small bowel with layering air-fluid levels concerning for  developing obstruction.  2. No active cardiopulmonary disease.     6:47 AM Patient was reevaluated by me, reports pain has not been controlled. I discussed rectal exam with him which he denied at this time due to pain. Does report the blood noted was on the tissue and more so on the toilet. We discussed obtaining a CT abdomen in order to rule out obstruction at this time.  We will provide patient with more pain medication for symptomatic control.  Patient care signed out to incoming team, PA Harris pending CT Abdomen and proper disposition.    Portions of this note were generated with [employer]. Dictation errors may occur despite best attempts at proofreading.  Final Clinical Impression(s) / ED Diagnoses Final diagnoses:  Lower abdominal pain    Rx / DC Orders ED Discharge Orders    None       Janeece Fitting,  PA-C 06/30/19 0655    Molpus, Jenny Reichmann, MD 06/30/19 [phone]

## 2019-07-07 ENCOUNTER — Other Ambulatory Visit (HOSPITAL_COMMUNITY): Payer: Self-pay

## 2019-07-08 ENCOUNTER — Encounter (HOSPITAL_COMMUNITY): Payer: Self-pay

## 2019-07-08 ENCOUNTER — Inpatient Hospital Stay (HOSPITAL_COMMUNITY): Admission: RE | Admit: 2019-07-08 | Payer: Medicare Other | Source: Ambulatory Visit

## 2019-07-09 ENCOUNTER — Emergency Department (HOSPITAL_COMMUNITY)
Admission: EM | Admit: 2019-07-09 | Discharge: 2019-07-09 | Disposition: A | Discharge status: Home / Self Care | Payer: Medicare Other | Attending: Emergency Medicine | Admitting: Emergency Medicine

## 2019-07-09 ENCOUNTER — Other Ambulatory Visit: Payer: Self-pay

## 2019-07-09 ENCOUNTER — Emergency Department (HOSPITAL_COMMUNITY): Payer: Medicare Other

## 2019-07-09 DIAGNOSIS — Z79899 Other long term (current) drug therapy: Secondary | ICD-10-CM | POA: Insufficient documentation

## 2019-07-09 DIAGNOSIS — Z87891 Personal history of nicotine dependence: Secondary | ICD-10-CM | POA: Diagnosis not present

## 2019-07-09 DIAGNOSIS — R109 Unspecified abdominal pain: Secondary | ICD-10-CM | POA: Diagnosis present

## 2019-07-09 DIAGNOSIS — K5010 Crohn's disease of large intestine without complications: Secondary | ICD-10-CM | POA: Insufficient documentation

## 2019-07-09 DIAGNOSIS — I10 Essential (primary) hypertension: Secondary | ICD-10-CM | POA: Insufficient documentation

## 2019-07-09 LAB — URINALYSIS, ROUTINE W REFLEX MICROSCOPIC
Bilirubin Urine: NEGATIVE
Glucose, UA: NEGATIVE mg/dL
Hgb urine dipstick: NEGATIVE
Ketones, ur: NEGATIVE mg/dL
Leukocytes,Ua: NEGATIVE
Nitrite: NEGATIVE
Protein, ur: NEGATIVE mg/dL
Specific Gravity, Urine: 1.005 (ref 1.005–1.030)
pH: 5 (ref 5.0–8.0)

## 2019-07-09 LAB — COMPREHENSIVE METABOLIC PANEL
ALT: 28 U/L (ref 0–44)
AST: 15 U/L (ref 15–41)
Albumin: 3.7 g/dL (ref 3.5–5.0)
Alkaline Phosphatase: 69 U/L (ref 38–126)
Anion gap: 9 (ref 5–15)
BUN: 12 mg/dL (ref 6–20)
CO2: 27 mmol/L (ref 22–32)
Calcium: 8.9 mg/dL (ref 8.9–10.3)
Chloride: 109 mmol/L (ref 98–111)
Creatinine, Ser: 0.88 mg/dL (ref 0.61–1.24)
GFR calc Af Amer: >60 mL/min (ref >60)
GFR calc non Af Amer: >60 mL/min (ref >60)
Glucose, Bld: 188 mg/dL — ABNORMAL HIGH (ref 70–99)
Potassium: 3.8 mmol/L (ref 3.5–5.1)
Sodium: 145 mmol/L (ref 135–145)
Total Bilirubin: 0.2 mg/dL — ABNORMAL LOW (ref 0.3–1.2)
Total Protein: 6.4 g/dL — ABNORMAL LOW (ref 6.5–8.1)

## 2019-07-09 LAB — CBC WITH DIFFERENTIAL/PLATELET
Abs Immature Granulocytes: 0.08 10*3/uL — ABNORMAL HIGH (ref 0.00–0.07)
Basophils Absolute: 0 10*3/uL (ref 0.0–0.1)
Basophils Relative: 0 %
Eosinophils Absolute: 0 10*3/uL (ref 0.0–0.5)
Eosinophils Relative: 0 %
HCT: 39.1 % (ref 39.0–52.0)
Hemoglobin: 13.3 g/dL (ref 13.0–17.0)
Immature Granulocytes: 1 %
Lymphocytes Relative: 9 %
Lymphs Abs: 0.9 10*3/uL (ref 0.7–4.0)
MCH: 30.9 pg (ref 26.0–34.0)
MCHC: 34 g/dL (ref 30.0–36.0)
MCV: 90.7 fL (ref 80.0–100.0)
Monocytes Absolute: 0.5 10*3/uL (ref 0.1–1.0)
Monocytes Relative: 5 %
Neutro Abs: 8.1 10*3/uL — ABNORMAL HIGH (ref 1.7–7.7)
Neutrophils Relative %: 85 %
Platelets: 342 10*3/uL (ref 150–400)
RBC: 4.31 MIL/uL (ref 4.22–5.81)
RDW: 14.6 % (ref 11.5–15.5)
WBC: 9.7 10*3/uL (ref 4.0–10.5)
nRBC: 0 % (ref 0.0–0.2)

## 2019-07-09 LAB — LIPASE, BLOOD: Lipase: 34 U/L (ref 11–51)

## 2019-07-09 MED ORDER — PROMETHAZINE HCL 25 MG/ML IJ SOLN
25.0000 mg | Freq: Once | INTRAMUSCULAR | Status: AC
  Administered 2019-07-09: 25 mg via INTRAVENOUS
  Filled 2019-07-09: qty 1

## 2019-07-09 MED ORDER — PROMETHAZINE HCL 25 MG/ML IJ SOLN: 25.0000 mg | Freq: Once | INTRAMUSCULAR | Status: DC

## 2019-07-09 MED ORDER — OXYCODONE HCL 5 MG PO TABS: 5.0000 mg | ORAL_TABLET | Freq: Four times a day (QID) | ORAL | 0 refills | Status: DC | PRN

## 2019-07-09 MED ORDER — HYDROMORPHONE HCL 1 MG/ML IJ SOLN
1.0000 mg | Freq: Once | INTRAMUSCULAR | Status: AC
  Administered 2019-07-09: 1 mg via INTRAVENOUS
  Filled 2019-07-09: qty 1

## 2019-07-09 MED ORDER — SODIUM CHLORIDE 0.9 % IV BOLUS
1000.0000 mL | Freq: Once | INTRAVENOUS | Status: AC
  Administered 2019-07-09: 1000 mL via INTRAVENOUS

## 2019-07-09 NOTE — Discharge Instructions (Signed)
Take oxycodone for pain. You need to get your pain medicines from your GI doctor or primary care doctor   Follow up with GI and surgeon and primary care doctor   Return to ER if you have worse abdominal pain, vomiting, fever.

## 2019-07-09 NOTE — ED Provider Notes (Signed)
[facility] DEPT Provider Note   CSN: [id number] Arrival date & time: 07/09/19  1846     History Chief Complaint  Patient presents with  . Abdominal Pain    Ankur Volkert is a 39 y.o. male history of Crohn's disease with fistula, bipolar, who presented with abdominal pain.  This is a recurrent problem .  Patient was admitted for pain control and IV steroids about a month ago.  Patient was seen in the ED about 2 weeks ago for abdominal pain and his CT scan showed stable fistula at that time with no obstruction.  Patient then went to see IBD clinic at [facility] 5 days ago and was started on prednisone and also will be starting on Stelara.  Patient states that he ran out of his pain medicine since yesterday.  He states that he was prescribed several oxycodone pills from the ER during his last ED visit.  States that he has right-sided abdominal pain is 10 out of 10.  He is associated with some nausea but no vomiting.  States that he is still passing gas.  Denies any fevers.  The history is provided by the patient.       Past Medical History:  Diagnosis Date  . Anxiety   . Arthritis   . Bipolar affective ([place])   . Chronic headaches   . Crohn's disease ([place]) history of  . Depression   . GERD (gastroesophageal reflux disease)   . Hypertension   . Noncompliance 04/17/2018  . PTSD (post-traumatic stress disorder) 04/17/2018  . Schizophrenic disorder [facility])     Patient Active Problem List   Diagnosis Date Noted  . Acute Crohn's disease ([place]) 06/17/2019  . GERD (gastroesophageal reflux disease) 06/07/2019  . 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 Crohn's colitis ([place]) 05/20/2019  . Suicidal ideation 05/19/2019  . Crohn's colitis, with fistula ([place]) 05/19/2019  . Crohn's colitis, with intestinal obstruction ([place]) 10/11/2018  . Crohn's disease of colon with intestinal obstruction ([place])   .  Noncompliance 04/17/2018  . PTSD (post-traumatic stress disorder) 04/17/2018  . Crohn's disease of jejunum with intestinal obstruction ([place]) 10/26/2017  . SBO (small bowel obstruction) ([place]) 10/26/2017  . Bipolar 1 disorder ([place]) 10/26/2017  . Crohn's colitis, unspecified complication ([place]) 46/27/0350  . Epigastric burning sensation 01/03/2015  . Numbness and tingling in left arm 11/15/2014  . Exacerbation of Crohn's disease ([place]) 10/21/2014  . Crohn's disease ([place]) 10/21/2014  . Diarrhea 10/21/2014  . Abdominal pain   . Gout 04/18/2013  . Cubital tunnel syndrome 04/18/2013  . Abnormal urinalysis 04/18/2013    Past Surgical History:  Procedure Laterality Date  . NO PAST SURGERIES         Family History  Problem Relation Age of Onset  . Hypertension Mother   . Heart attack Mother   . Stroke Mother   . Alcohol abuse Father   . Mental illness Father   . Multiple sclerosis Father   . Arthritis Maternal Grandmother   . Hypertension Maternal Grandmother   . Heart disease Maternal Grandmother   . Kidney disease Maternal Grandmother   . Diabetes Maternal Grandmother   . Cancer Maternal Grandmother        great, type unknown  . Hyperlipidemia Maternal Grandfather   . Colon polyps Maternal Grandfather   . Stroke Paternal Grandmother   . Ulcerative colitis Maternal Aunt     Social History   Tobacco Use  . Smoking status:  Former Smoker    Packs/day: 0.50    Years: 15.00    Pack years: 7.50    Types: Cigars  . Smokeless tobacco: Never Used  Substance Use Topics  . Alcohol use: No    Alcohol/week: 0.0 standard drinks  . Drug use: No    Home Medications Prior to Admission medications   Medication Sig Start Date End Date Taking? Authorizing Provider  carbamazepine (TEGRETOL) 100 MG chewable tablet Chew 1 tablet (100 mg total) by mouth 2 (two) times daily. For mood stabilization 05/29/19  Yes Nwoko, Herbert Pun I, NP  celecoxib (CELEBREX) 200 MG capsule Take 1 capsule (200 mg  total) by mouth 2 (two) times daily. 06/30/19  Yes Harris, Abigail, PA-C  dicyclomine (BENTYL) 20 MG tablet Take 1 tablet (20 mg total) by mouth 4 (four) times daily as needed for spasms. 06/30/19  Yes Harris, Abigail, PA-C  FLUoxetine (PROZAC) 40 MG capsule Take 1 capsule (40 mg total) by mouth daily. For depression 05/30/19  Yes Nwoko, Herbert Pun I, NP  oxyCODONE (ROXICODONE) 5 MG immediate release tablet Take 1 tablet (5 mg total) by mouth every 4 (four) hours as needed for severe pain. 06/30/19  Yes Harris, Abigail, PA-C  pantoprazole (PROTONIX) 40 MG tablet Take 1 tablet (40 mg total) by mouth daily. For acid reflux 06/10/19 07/10/19 Yes Arrien, Jimmy Picket, MD  predniSONE (DELTASONE) 20 MG tablet Take 60 mg by mouth daily with breakfast.   Yes [provider]  nicotine (NICODERM CQ - DOSED IN MG/24 HOURS) 21 mg/24hr patch Place 1 patch (21 mg total) onto the skin daily. (May buy from over the counter): For smoking cessation Patient not taking: Reported on 06/17/2019 05/30/19   Lindell Spar I, NP  oxyCODONE (OXY IR/ROXICODONE) 5 MG immediate release tablet Take 1 tablet (5 mg total) by mouth every 6 (six) hours as needed for severe pain. Patient not taking: Reported on 07/09/2019 06/23/19   Kerin Perna, NP  predniSONE (DELTASONE) 20 MG tablet Take 2 tablets (40 mg total) by mouth daily with breakfast for 7 days, THEN 1.5 tablets (30 mg total) daily with breakfast for 7 days, THEN 1 tablet (20 mg total) daily with breakfast for 7 days. Please follow up with Dr. Michail Sermon for additional adjustments to your prednisone. Patient not taking: Reported on 07/09/2019 06/23/19 07/14/19  Elodia Florence., MD  omeprazole (PRILOSEC) 40 MG capsule Take 1 capsule (40 mg total) by mouth daily. Patient not taking: Reported on 01/18/2018 10/17/16 08/07/18  Nita Sells, MD  sucralfate (CARAFATE) 1 g tablet Take 1 tablet (1 g total) by mouth 4 (four) times daily -  with meals and at bedtime. Patient not  taking: Reported on 04/17/2018 01/18/18 08/07/18  Varney Biles, MD    Allergies    Patient has no known allergies.  Review of Systems   Review of Systems  Gastrointestinal: Positive for abdominal pain.  All other systems reviewed and are negative.   Physical Exam Updated Vital Signs BP 115/73 (BP Location: Right Arm)   Pulse 81   Temp 98.2 F (36.8 C) (Oral)   Resp 16   Ht 5' 4"  (1.626 m)   Wt 68.9 kg   SpO2 96%   BMI 26.09 kg/m   Physical Exam Vitals and nursing note reviewed.  HENT:     Head: Normocephalic.     Mouth/Throat:     Mouth: Mucous membranes are moist.  Eyes:     Extraocular Movements: Extraocular movements intact.  Cardiovascular:  Rate and Rhythm: Normal rate and regular rhythm.     Heart sounds: Normal heart sounds.  Pulmonary:     Effort: Pulmonary effort is normal.     Breath sounds: Normal breath sounds.  Abdominal:     General: Abdomen is flat.     Comments: Distended, Mild tenderness R abdomen, no rebound   Skin:    General: Skin is warm.     Capillary Refill: Capillary refill takes less than 2 seconds.  Neurological:     General: No focal deficit present.     Mental Status: He is alert and oriented to person, place, and time.  Psychiatric:        Mood and Affect: Mood normal.        Behavior: Behavior normal.     ED Results / Procedures / Treatments   Labs (all labs ordered are listed, but only abnormal results are displayed) Labs Reviewed  CBC WITH DIFFERENTIAL/PLATELET - Abnormal; Notable for the following components:      Result Value   Neutro Abs 8.1 (*)    Abs Immature Granulocytes 0.08 (*)    All other components within normal limits  COMPREHENSIVE METABOLIC PANEL - Abnormal; Notable for the following components:   Glucose, Bld 188 (*)    Total Protein 6.4 (*)    Total Bilirubin 0.2 (*)    All other components within normal limits  URINALYSIS, ROUTINE W REFLEX MICROSCOPIC - Abnormal; Notable for the following  components:   Color, Urine STRAW (*)    All other components within normal limits  LIPASE, BLOOD    EKG None  Radiology DG ABD ACUTE 2+V W 1V CHEST  Result Date: 07/09/2019 CLINICAL DATA:  RIGHT-side abdominal pain for 3 days, former smoker, hypertension, history Crohn's disease EXAM: DG ABDOMEN ACUTE W/ 1V CHEST COMPARISON:  06/30/2019 FINDINGS: Normal heart size, mediastinal contours, and pulmonary vascularity. Lungs clear. No infiltrate, pleural effusion, or pneumothorax. Scattered stool throughout colon. Nonspecific air-filled loop of bowel in the mid abdomen likely prominent small bowel. No bowel wall thickening, evidence of obstruction, or perforation. Osseous structures unremarkable. No urinary tract calcification. IMPRESSION: Nonspecific air-filled upper caliber small bowel loops in mid abdomen. Otherwise negative exam. Electronically Signed   By: Lavonia Dana M.D.   On: 07/09/2019 20:25    Procedures Procedures (including critical care time)  Medications Ordered in ED Medications  sodium chloride 0.9 % bolus 1,000 mL (1,000 mLs Intravenous New Bag/Given 07/09/19 2007)  HYDROmorphone (DILAUDID) injection 1 mg (1 mg Intravenous Given 07/09/19 2008)  promethazine (PHENERGAN) injection 25 mg (25 mg Intravenous Given 07/09/19 2012)    ED Course  I have reviewed the triage vital signs and the nursing notes.  Pertinent labs & imaging results that were available during my care of the patient were reviewed by me and considered in my medical decision making (see chart for details).    MDM Rules/Calculators/A&P                      Azarel Romig is a 39 y.o. male who presented with abdominal pain.  Patient is tachycardic and appears to be uncomfortable.  Patient just ran out of his pain meds yesterday.  He had multiple symptoms recently that showed stable fistula and patient has no vomiting currently. Low suspicion for SBO so if xrays unremarkable, will not need CT. Will get cbc, cmp,  lipase, UA, xrays. Will hydrate.   10:03 PM Labs unremarkable, x-rays showed no obvious SBO.  Patient felt better after IV fluids and medicines.  I told him that I can only give him a short course of pain medicine.  He needs to follow-up with his PCP or GI if he needs chronic pain meds.  He was tachycardic initially but his heart rate now is down to 81.  Final Clinical Impression(s) / ED Diagnoses Final diagnoses:  None    Rx / DC Orders ED Discharge Orders    None       Drenda Freeze, MD 07/09/19 2204

## 2019-07-09 NOTE — ED Triage Notes (Signed)
Patient reports abd pain in right side of abd x 3 days. Has history of chron's disease, appt with surgeon 6/15. Patients says he ran out of oxycontin pain medication yesterday

## 2019-07-28 DIAGNOSIS — K5000 Crohn's disease of small intestine without complications: Secondary | ICD-10-CM | POA: Insufficient documentation

## 2019-08-19 ENCOUNTER — Encounter (HOSPITAL_COMMUNITY): Payer: Medicare Other

## 2019-09-12 DIAGNOSIS — R198 Other specified symptoms and signs involving the digestive system and abdomen: Secondary | ICD-10-CM | POA: Insufficient documentation

## 2019-09-22 ENCOUNTER — Encounter (HOSPITAL_COMMUNITY): Payer: Self-pay | Admitting: Emergency Medicine

## 2019-09-22 ENCOUNTER — Emergency Department (HOSPITAL_COMMUNITY)
Admission: EM | Admit: 2019-09-22 | Discharge: 2019-09-23 | Disposition: A | Discharge status: Home / Self Care | Payer: Medicare Other | Attending: Emergency Medicine | Admitting: Emergency Medicine

## 2019-09-22 ENCOUNTER — Emergency Department (HOSPITAL_COMMUNITY): Payer: Medicare Other

## 2019-09-22 ENCOUNTER — Other Ambulatory Visit: Payer: Self-pay

## 2019-09-22 DIAGNOSIS — R1031 Right lower quadrant pain: Secondary | ICD-10-CM | POA: Diagnosis present

## 2019-09-22 DIAGNOSIS — K9419 Other complications of enterostomy: Secondary | ICD-10-CM

## 2019-09-22 DIAGNOSIS — Z20822 Contact with and (suspected) exposure to covid-19: Secondary | ICD-10-CM | POA: Diagnosis not present

## 2019-09-22 DIAGNOSIS — F17290 Nicotine dependence, other tobacco product, uncomplicated: Secondary | ICD-10-CM | POA: Diagnosis not present

## 2019-09-22 LAB — CBC
HCT: 34.6 % — ABNORMAL LOW (ref 39.0–52.0)
Hemoglobin: 11.1 g/dL — ABNORMAL LOW (ref 13.0–17.0)
MCH: 29 pg (ref 26.0–34.0)
MCHC: 32.1 g/dL (ref 30.0–36.0)
MCV: 90.3 fL (ref 80.0–100.0)
Platelets: 307 10*3/uL (ref 150–400)
RBC: 3.83 MIL/uL — ABNORMAL LOW (ref 4.22–5.81)
RDW: 15.2 % (ref 11.5–15.5)
WBC: 8.5 10*3/uL (ref 4.0–10.5)
nRBC: 0 % (ref 0.0–0.2)

## 2019-09-22 LAB — URINALYSIS, ROUTINE W REFLEX MICROSCOPIC
Bilirubin Urine: NEGATIVE
Glucose, UA: NEGATIVE mg/dL
Hgb urine dipstick: NEGATIVE
Ketones, ur: NEGATIVE mg/dL
Leukocytes,Ua: NEGATIVE
Nitrite: NEGATIVE
Protein, ur: NEGATIVE mg/dL
Specific Gravity, Urine: 1.008 (ref 1.005–1.030)
pH: 5 (ref 5.0–8.0)

## 2019-09-22 LAB — COMPREHENSIVE METABOLIC PANEL
ALT: 93 U/L — ABNORMAL HIGH (ref 0–44)
AST: 55 U/L — ABNORMAL HIGH (ref 15–41)
Albumin: 4 g/dL (ref 3.5–5.0)
Alkaline Phosphatase: 77 U/L (ref 38–126)
Anion gap: 14 (ref 5–15)
BUN: 8 mg/dL (ref 6–20)
CO2: 22 mmol/L (ref 22–32)
Calcium: 8.8 mg/dL — ABNORMAL LOW (ref 8.9–10.3)
Chloride: 108 mmol/L (ref 98–111)
Creatinine, Ser: 0.76 mg/dL (ref 0.61–1.24)
GFR calc Af Amer: >60 mL/min (ref >60)
GFR calc non Af Amer: >60 mL/min (ref >60)
Glucose, Bld: 98 mg/dL (ref 70–99)
Potassium: 3.7 mmol/L (ref 3.5–5.1)
Sodium: 144 mmol/L (ref 135–145)
Total Bilirubin: 0.6 mg/dL (ref 0.3–1.2)
Total Protein: 6.6 g/dL (ref 6.5–8.1)

## 2019-09-22 LAB — LIPASE, BLOOD: Lipase: 95 U/L — ABNORMAL HIGH (ref 11–51)

## 2019-09-22 MED ORDER — LACTATED RINGERS IV BOLUS
1000.0000 mL | Freq: Once | INTRAVENOUS | Status: AC
  Administered 2019-09-22: 1000 mL via INTRAVENOUS

## 2019-09-22 MED ORDER — ONDANSETRON HCL 4 MG/2ML IJ SOLN
4.0000 mg | Freq: Once | INTRAMUSCULAR | Status: AC
  Administered 2019-09-22: 4 mg via INTRAVENOUS
  Filled 2019-09-22: qty 2

## 2019-09-22 MED ORDER — IOHEXOL 300 MG/ML  SOLN
100.0000 mL | Freq: Once | INTRAMUSCULAR | Status: AC | PRN
  Administered 2019-09-23: 100 mL via INTRAVENOUS

## 2019-09-22 MED ORDER — HYDROMORPHONE HCL 1 MG/ML IJ SOLN
1.0000 mg | Freq: Once | INTRAMUSCULAR | Status: AC
  Administered 2019-09-22: 1 mg via INTRAVENOUS
  Filled 2019-09-22: qty 1

## 2019-09-22 NOTE — ED Notes (Signed)
[place] for surgical consult@22 :30pm.

## 2019-09-22 NOTE — ED Triage Notes (Signed)
Per pt, states he had abdominal surgery in [place] 2 weeks ago-had a ileostomy placed-states he is having pain-has been hospitalized since surgery for pain control-states he doesn't have money to keep driving to [place] so he came here

## 2019-09-22 NOTE — ED Provider Notes (Signed)
[name] DEPT Provider Note   CSN: [id number] Arrival date & time: 09/22/19  1349     History Chief Complaint  Patient presents with   Abdominal Pain    Jeffery Mcintosh is a 39 y.o. male.  HPI      39 year old male with history of refractory Crohn's disease with recent lap assisted ileocecectomy with end ileostomy with Dr. Morton Stall 08/31/2019 and readmission August 9 with discharge August 12 from [facility] for having 3 days of ostomy output greater than 3 L/day who presents with repeat increased ostomy output, estimating amount similar to prior to his recent admission.  Had surgery 2 weeks asgo in [place], ileostomy Supposed to call if output high 3000cc output for one week--was admitted then discharged Over the past 3-4 days output increased, has not calculated but empyting 15 times a day about 200cc per time, estimate about 3000 again immodium not helping--is taking 2 13m four times daily for the last 2 days. Has not been taking metamucil.  Pain over the last 3 days, shooting up on the right side from around the ileostomy and the stoma is throbbing Nausea, no vomiting No fevers No bloody stool    Past Medical History:  Diagnosis Date   Anxiety    Arthritis    Bipolar affective ([place]    Chronic headaches    Crohn's disease ([place] history of   Depression    GERD (gastroesophageal reflux disease)    Hypertension    Noncompliance 04/17/2018   PTSD (post-traumatic stress disorder) 04/17/2018   Schizophrenic disorder ([place]     Patient Active Problem List   Diagnosis Date Noted   Acute Crohn's disease ([place] 06/17/2019   GERD (gastroesophageal reflux disease) 06/07/2019   MDD (major depressive disorder), recurrent severe, without psychosis ([place] 05/25/2019   Crohn's colitis ([place] 05/20/2019   Suicidal ideation 05/19/2019   Crohn's colitis, with fistula ([place] 05/19/2019   Crohn's colitis, with  intestinal obstruction ([facility] 10/11/2018   Crohn's disease of colon with intestinal obstruction ([place]    Noncompliance 04/17/2018   PTSD (post-traumatic stress disorder) 04/17/2018   Crohn's disease of jejunum with intestinal obstruction ([facility] 10/26/2017   SBO (small bowel obstruction) ([facility] 10/26/2017   Bipolar 1 disorder ([place] 10/26/2017   Crohn's colitis, unspecified complication (HW[place] 097/98/9211  Epigastric burning sensation 01/03/2015   Numbness and tingling in left arm 11/15/2014   Exacerbation of Crohn's disease ([place] 10/21/2014   Crohn's disease ([place] 10/21/2014   Diarrhea 10/21/2014   Abdominal pain    Gout 04/18/2013   Cubital tunnel syndrome 04/18/2013   Abnormal urinalysis 04/18/2013    Past Surgical History:  Procedure Laterality Date   NO PAST SURGERIES         Family History  Problem Relation Age of Onset   Hypertension Mother    Heart attack Mother    Stroke Mother    Alcohol abuse Father    Mental illness Father    Multiple sclerosis Father    Arthritis Maternal Grandmother    Hypertension Maternal Grandmother    Heart disease Maternal Grandmother    Kidney disease Maternal Grandmother    Diabetes Maternal Grandmother    Cancer Maternal Grandmother        great, type unknown   Hyperlipidemia Maternal Grandfather    Colon polyps Maternal Grandfather    Stroke Paternal Grandmother    Ulcerative colitis Maternal Aunt     Social History   Tobacco Use   Smoking status:  Former Smoker    Packs/day: 0.50    Years: 15.00    Pack years: 7.50    Types: Cigars   Smokeless tobacco: Never Used  Scientific laboratory technician Use: Every day   Start date: 08/15/2015  Substance Use Topics   Alcohol use: No    Alcohol/week: 0.0 standard drinks   Drug use: No    Home Medications Prior to Admission medications   Medication Sig Start Date End Date Taking? Authorizing Provider  carbamazepine (TEGRETOL) 100 MG chewable tablet  Chew 1 tablet (100 mg total) by mouth 2 (two) times daily. For mood stabilization 05/29/19   Lindell Spar I, NP  celecoxib (CELEBREX) 200 MG capsule Take 1 capsule (200 mg total) by mouth 2 (two) times daily. 06/30/19   Margarita Mail, PA-C  dicyclomine (BENTYL) 20 MG tablet Take 1 tablet (20 mg total) by mouth 4 (four) times daily as needed for spasms. 06/30/19   Margarita Mail, PA-C  FLUoxetine (PROZAC) 40 MG capsule Take 1 capsule (40 mg total) by mouth daily. For depression 05/30/19   Lindell Spar I, NP  nicotine (NICODERM CQ - DOSED IN MG/24 HOURS) 21 mg/24hr patch Place 1 patch (21 mg total) onto the skin daily. (May buy from over the counter): For smoking cessation Patient not taking: Reported on 06/17/2019 05/30/19   Lindell Spar I, NP  oxyCODONE (OXY IR/ROXICODONE) 5 MG immediate release tablet Take 1 tablet (5 mg total) by mouth every 6 (six) hours as needed for severe pain. 07/09/19   Drenda Freeze, MD  pantoprazole (PROTONIX) 40 MG tablet Take 1 tablet (40 mg total) by mouth daily. For acid reflux 06/10/19 07/10/19  Arrien, Jimmy Picket, MD  predniSONE (DELTASONE) 20 MG tablet Take 60 mg by mouth daily with breakfast.    [provider]  omeprazole (PRILOSEC) 40 MG capsule Take 1 capsule (40 mg total) by mouth daily. Patient not taking: Reported on 01/18/2018 10/17/16 08/07/18  Nita Sells, MD  sucralfate (CARAFATE) 1 g tablet Take 1 tablet (1 g total) by mouth 4 (four) times daily -  with meals and at bedtime. Patient not taking: Reported on 04/17/2018 01/18/18 08/07/18  Varney Biles, MD    Allergies    Patient has no known allergies.  Review of Systems   Review of Systems  Constitutional: Negative for fever.  HENT: Negative for sore throat.   Eyes: Negative for visual disturbance.  Respiratory: Negative for shortness of breath.   Cardiovascular: Negative for chest pain.  Gastrointestinal: Positive for abdominal pain, diarrhea and nausea.  Genitourinary: Negative  for difficulty urinating.  Musculoskeletal: Negative for back pain and neck stiffness.  Skin: Negative for rash.  Neurological: Negative for syncope and headaches.    Physical Exam Updated Vital Signs BP (!) 141/81 (BP Location: Left Arm)    Pulse 82    Temp 99 F (37.2 C) (Oral)    Resp 15    SpO2 100%   Physical Exam Vitals and nursing note reviewed.  Constitutional:      General: He is not in acute distress.    Appearance: Normal appearance. He is not ill-appearing, toxic-appearing or diaphoretic.  HENT:     Head: Normocephalic.  Eyes:     Conjunctiva/sclera: Conjunctivae normal.  Cardiovascular:     Rate and Rhythm: Normal rate and regular rhythm.     Pulses: Normal pulses.  Pulmonary:     Effort: Pulmonary effort is normal. No respiratory distress.  Abdominal:     Tenderness:  There is abdominal tenderness.     Comments: Ileostomy present, pink, no significant tenderness Tenderness RLQ  Musculoskeletal:        General: No deformity or signs of injury.     Cervical back: No rigidity.  Skin:    General: Skin is warm and dry.     Coloration: Skin is not jaundiced or pale.  Neurological:     General: No focal deficit present.     Mental Status: He is alert and oriented to person, place, and time.     ED Results / Procedures / Treatments   Labs (all labs ordered are listed, but only abnormal results are displayed) Labs Reviewed  LIPASE, BLOOD - Abnormal; Notable for the following components:      Result Value   Lipase 95 (*)    All other components within normal limits  COMPREHENSIVE METABOLIC PANEL - Abnormal; Notable for the following components:   Calcium 8.8 (*)    AST 55 (*)    ALT 93 (*)    All other components within normal limits  CBC - Abnormal; Notable for the following components:   RBC 3.83 (*)    Hemoglobin 11.1 (*)    HCT 34.6 (*)    All other components within normal limits  SARS CORONAVIRUS 2 BY RT PCR (HOSPITAL ORDER, [facility])  URINALYSIS, ROUTINE W REFLEX MICROSCOPIC    EKG None  Radiology No results found.  Procedures Procedures (including critical care time)  Medications Ordered in ED Medications  lactated ringers bolus 1,000 mL (has no administration in time range)  HYDROmorphone (DILAUDID) injection 1 mg (has no administration in time range)  ondansetron (ZOFRAN) injection 4 mg (has no administration in time range)    ED Course  I have reviewed the triage vital signs and the nursing notes.  Pertinent labs & imaging results that were available during my care of the patient were reviewed by me and considered in my medical decision making (see chart for details).    MDM Rules/Calculators/A&P                          39 year old male with history of refractory Crohn's disease with recent lap assisted ileocecectomy with end ileostomy with Dr. Morton Stall 08/31/2019 and readmission August 9 with discharge August 12 from [facility] for having 3 days of ostomy output greater than 3 L/day who presents with repeat increased ostomy output, estimating amount similar to prior to his recent admission.  Labs show mild elevation in lipase not consistent with pancreatitis.  No significant electrolyte abnormalities. Given IV fluids, pain and nausea medication.  CT abdomen pelvis ordered for further evaluation of abdominal pain in setting of recent surgery.  Discussed with [facility] surgery Dr. Alvan Dame patient's increased ostomy output.  She recommends if no acute findings on CT given his normal electrolytes, normal Cr and vital signs they can continue to manage his increased ostomy output as an outpatient.  Given IV fluids in the ED.  CT pending at time of transfer of care to Dr. Dayna Barker.   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

## 2019-09-22 NOTE — ED Notes (Signed)
Patient called for room placement x1 with no answer.

## 2019-09-23 LAB — SARS CORONAVIRUS 2 BY RT PCR (HOSPITAL ORDER, PERFORMED IN ~~LOC~~ HOSPITAL LAB): SARS Coronavirus 2: NEGATIVE

## 2019-09-23 MED ORDER — LOPERAMIDE HCL 2 MG PO CAPS: 4.0000 mg | ORAL_CAPSULE | Freq: Four times a day (QID) | ORAL | 0 refills | Status: DC | PRN

## 2019-09-23 MED ORDER — HYDROMORPHONE HCL 1 MG/ML IJ SOLN
1.0000 mg | Freq: Once | INTRAMUSCULAR | Status: AC
  Administered 2019-09-23: 1 mg via INTRAVENOUS
  Filled 2019-09-23: qty 1

## 2019-09-23 MED ORDER — SODIUM CHLORIDE (PF) 0.9 % IJ SOLN
INTRAMUSCULAR | Status: AC
  Filled 2019-09-23: qty 50

## 2019-09-23 MED ORDER — METAMUCIL SMOOTH TEXTURE 58.6 % PO POWD: 1.0000 | Freq: Three times a day (TID) | ORAL | 12 refills | Status: DC

## 2019-09-23 NOTE — ED Provider Notes (Signed)
12:04 AM Assumed care from Dr. Billy Fischer, please see their note for full history, physical and decision making until this point. In brief this is a 39 y.o. year old male who presented to the ED tonight with Abdominal Pain     Questionable high output, pending ct scan. If ok, per [facility], can be discharged with follow up.   CT scan is improved from before death and no emergent conditions present.  Per previous providers discussion with Mina Marble there is no evidence of significant dehydration or concern from the high output that the patient describes.  When I went to tell the patient about this he was initially laid in the bed very comfortable watching videos on his phone however as vital moving was okay and would likely be discharged and he was stated he was still having 10 out of 10 pain.  His vital signs are within normal limits.  And without any structural causes or laboratory findings to be concerned for significant anatomic or physiologic cause for his pain I feel he can be discharged at this time.  We will give 1 more dose of analgesic prior to discharge.  Encouraged follow-up with his surgeons for his increased output. Lipase slightly elevated, no e/o pancreatitis on ct scan.   Discharge instructions, including strict return precautions for new or worsening symptoms, given. Patient and/or family verbalized understanding and agreement with the plan as described.   Labs, studies and imaging reviewed by myself and considered in medical decision making if ordered. Imaging interpreted by radiology.  Labs Reviewed  LIPASE, BLOOD - Abnormal; Notable for the following components:      Result Value   Lipase 95 (*)    All other components within normal limits  COMPREHENSIVE METABOLIC PANEL - Abnormal; Notable for the following components:   Calcium 8.8 (*)    AST 55 (*)    ALT 93 (*)    All other components within normal limits  CBC - Abnormal; Notable for the following components:   RBC 3.83 (*)     Hemoglobin 11.1 (*)    HCT 34.6 (*)    All other components within normal limits  SARS CORONAVIRUS 2 BY RT PCR (HOSPITAL ORDER, [facility] [facility])  URINALYSIS, ROUTINE W REFLEX MICROSCOPIC    CT ABDOMEN PELVIS W CONTRAST    (Results Pending)    No follow-ups on file.    Josslyn Ciolek, Corene Cornea, MD 09/23/19 [phone]

## 2019-09-29 ENCOUNTER — Encounter (HOSPITAL_COMMUNITY): Payer: Self-pay | Admitting: Emergency Medicine

## 2019-09-29 ENCOUNTER — Emergency Department (HOSPITAL_COMMUNITY)
Admission: EM | Admit: 2019-09-29 | Discharge: 2019-09-29 | Disposition: A | Discharge status: Home / Self Care | Payer: Medicare Other | Attending: Emergency Medicine | Admitting: Emergency Medicine

## 2019-09-29 ENCOUNTER — Emergency Department (HOSPITAL_COMMUNITY)
Admission: EM | Admit: 2019-09-29 | Discharge: 2019-09-30 | Disposition: A | Discharge status: Home / Self Care | Payer: Medicare Other | Source: Home / Self Care | Attending: Emergency Medicine | Admitting: Emergency Medicine

## 2019-09-29 ENCOUNTER — Other Ambulatory Visit: Payer: Self-pay

## 2019-09-29 DIAGNOSIS — R109 Unspecified abdominal pain: Secondary | ICD-10-CM | POA: Insufficient documentation

## 2019-09-29 DIAGNOSIS — Z79899 Other long term (current) drug therapy: Secondary | ICD-10-CM | POA: Insufficient documentation

## 2019-09-29 DIAGNOSIS — K219 Gastro-esophageal reflux disease without esophagitis: Secondary | ICD-10-CM | POA: Insufficient documentation

## 2019-09-29 DIAGNOSIS — Z87891 Personal history of nicotine dependence: Secondary | ICD-10-CM | POA: Insufficient documentation

## 2019-09-29 DIAGNOSIS — K9409 Other complications of colostomy: Secondary | ICD-10-CM | POA: Insufficient documentation

## 2019-09-29 DIAGNOSIS — I10 Essential (primary) hypertension: Secondary | ICD-10-CM | POA: Insufficient documentation

## 2019-09-29 DIAGNOSIS — K9419 Other complications of enterostomy: Secondary | ICD-10-CM

## 2019-09-29 LAB — COMPREHENSIVE METABOLIC PANEL
ALT: 53 U/L — ABNORMAL HIGH (ref 0–44)
AST: 38 U/L (ref 15–41)
Albumin: 4.3 g/dL (ref 3.5–5.0)
Alkaline Phosphatase: 90 U/L (ref 38–126)
Anion gap: 13 (ref 5–15)
BUN: 9 mg/dL (ref 6–20)
CO2: 23 mmol/L (ref 22–32)
Calcium: 9.1 mg/dL (ref 8.9–10.3)
Chloride: 106 mmol/L (ref 98–111)
Creatinine, Ser: 1.23 mg/dL (ref 0.61–1.24)
GFR calc Af Amer: >60 mL/min (ref >60)
GFR calc non Af Amer: >60 mL/min (ref >60)
Glucose, Bld: 113 mg/dL — ABNORMAL HIGH (ref 70–99)
Potassium: 3.7 mmol/L (ref 3.5–5.1)
Sodium: 142 mmol/L (ref 135–145)
Total Bilirubin: 0.7 mg/dL (ref 0.3–1.2)
Total Protein: 6.9 g/dL (ref 6.5–8.1)

## 2019-09-29 LAB — CBC
HCT: 39.8 % (ref 39.0–52.0)
Hemoglobin: 13.5 g/dL (ref 13.0–17.0)
MCH: 29.9 pg (ref 26.0–34.0)
MCHC: 33.9 g/dL (ref 30.0–36.0)
MCV: 88.2 fL (ref 80.0–100.0)
Platelets: 348 10*3/uL (ref 150–400)
RBC: 4.51 MIL/uL (ref 4.22–5.81)
RDW: 14.9 % (ref 11.5–15.5)
WBC: 7.6 10*3/uL (ref 4.0–10.5)
nRBC: 0 % (ref 0.0–0.2)

## 2019-09-29 NOTE — ED Triage Notes (Signed)
Per EMS-has had abdominal surgery 3 weeks ago-states he is having abdominal pain r/t ostomy placement-some prolapse

## 2019-09-29 NOTE — ED Triage Notes (Signed)
Pt had abdominal surgery done to place an ostomy x 3 weeks ago, c/o pain and prolapse to site. Verbal orders from Dr. Kathrynn Humble.

## 2019-09-29 NOTE — ED Notes (Signed)
Patient refused lab draw.

## 2019-09-30 ENCOUNTER — Emergency Department (HOSPITAL_COMMUNITY): Payer: Medicare Other

## 2019-09-30 DIAGNOSIS — R109 Unspecified abdominal pain: Secondary | ICD-10-CM | POA: Diagnosis not present

## 2019-09-30 MED ORDER — IOHEXOL 300 MG/ML  SOLN
100.0000 mL | Freq: Once | INTRAMUSCULAR | Status: AC | PRN
  Administered 2019-09-30: 100 mL via INTRAVENOUS

## 2019-09-30 MED ORDER — OXYCODONE HCL 5 MG PO TABS: 5.0000 mg | ORAL_TABLET | Freq: Three times a day (TID) | ORAL | 0 refills | Status: DC | PRN

## 2019-09-30 MED ORDER — MORPHINE SULFATE (PF) 4 MG/ML IV SOLN
4.0000 mg | Freq: Once | INTRAVENOUS | Status: AC
  Administered 2019-09-30: 4 mg via INTRAVENOUS
  Filled 2019-09-30: qty 1

## 2019-09-30 MED ORDER — HYDROCODONE-ACETAMINOPHEN 5-325 MG PO TABS
1.0000 | ORAL_TABLET | Freq: Once | ORAL | Status: AC
  Administered 2019-09-30: 1 via ORAL
  Filled 2019-09-30: qty 1

## 2019-09-30 MED ORDER — ONDANSETRON HCL 4 MG/2ML IJ SOLN
4.0000 mg | Freq: Once | INTRAMUSCULAR | Status: AC
  Administered 2019-09-30: 4 mg via INTRAVENOUS
  Filled 2019-09-30: qty 2

## 2019-09-30 MED ORDER — SODIUM CHLORIDE 0.9 % IV BOLUS
500.0000 mL | Freq: Once | INTRAVENOUS | Status: AC
  Administered 2019-09-30: 500 mL via INTRAVENOUS

## 2019-09-30 MED ORDER — ACETAMINOPHEN 325 MG PO TABS
650.0000 mg | ORAL_TABLET | Freq: Once | ORAL | Status: DC
  Filled 2019-09-30: qty 2

## 2019-09-30 NOTE — Discharge Instructions (Signed)
Prescription given for Norco. Take medication as directed and do not operate machinery, drive a car, or work while taking this medication as it can make you drowsy.   You will need to call your surgeon to schedule an appointment for follow up.   Please return to the emergency department for any new or worsening symptoms including any fevers, increased/refractory abdominal pain, discoloration to your stoma, or for any other concerns.

## 2019-09-30 NOTE — ED Provider Notes (Signed)
[facility] EMERGENCY DEPARTMENT Provider Note   CSN: [id number] Arrival date & time: 09/29/19  2237     History Chief Complaint  Patient presents with  . Post-op Problem    Jeffery Mcintosh is a 39 y.o. male.  HPI   39 year old male with a history of anxiety, bipolar affective, Crohn's disease, GERD, hypertension, PTSD, schizophrenic disorder, who presents to the emergency department today for evaluation of pain at his stoma site.  Patient had a recent abdominal surgery with ostomy creation about 3 weeks ago at [facility].  He states that 2 days ago he started having pain at his stoma site and noticed that his stoma was sticking out about 2 inches which is not normal for him.  He noticed that the area around the stoma in the area of the stoma have been very red and painful.  He has not had any fevers or vomiting.  He does feel nauseated.  He has had some loose output from the stoma and states he feels like he is dehydrated.  Past Medical History:  Diagnosis Date  . Anxiety   . Arthritis   . Bipolar affective ([place])   . Chronic headaches   . Crohn's disease ([place]) history of  . Depression   . GERD (gastroesophageal reflux disease)   . Hypertension   . Noncompliance 04/17/2018  . PTSD (post-traumatic stress disorder) 04/17/2018  . Schizophrenic disorder [facility])     Patient Active Problem List   Diagnosis Date Noted  . Acute Crohn's disease ([place]) 06/17/2019  . GERD (gastroesophageal reflux disease) 06/07/2019  . MDD (major depressive disorder), recurrent severe, without psychosis ([place]) 05/25/2019  . Crohn's colitis ([place]) 05/20/2019  . Suicidal ideation 05/19/2019  . Crohn's colitis, with fistula ([place]) 05/19/2019  . Crohn's colitis, with intestinal obstruction ([place]) 10/11/2018  . Crohn's disease of colon with intestinal obstruction ([place])   . Noncompliance 04/17/2018  . PTSD (post-traumatic stress disorder) 04/17/2018  . Crohn's disease of jejunum with intestinal  obstruction ([place]) 10/26/2017  . SBO (small bowel obstruction) ([place]) 10/26/2017  . Bipolar 1 disorder ([place]) 10/26/2017  . Crohn's colitis, unspecified complication ([place]) 08/65/7846  . Epigastric burning sensation 01/03/2015  . Numbness and tingling in left arm 11/15/2014  . Exacerbation of Crohn's disease ([place]) 10/21/2014  . Crohn's disease ([place]) 10/21/2014  . Diarrhea 10/21/2014  . Abdominal pain   . Gout 04/18/2013  . Cubital tunnel syndrome 04/18/2013  . Abnormal urinalysis 04/18/2013    Past Surgical History:  Procedure Laterality Date  . NO PAST SURGERIES         Family History  Problem Relation Age of Onset  . Hypertension Mother   . Heart attack Mother   . Stroke Mother   . Alcohol abuse Father   . Mental illness Father   . Multiple sclerosis Father   . Arthritis Maternal Grandmother   . Hypertension Maternal Grandmother   . Heart disease Maternal Grandmother   . Kidney disease Maternal Grandmother   . Diabetes Maternal Grandmother   . Cancer Maternal Grandmother        great, type unknown  . Hyperlipidemia Maternal Grandfather   . Colon polyps Maternal Grandfather   . Stroke Paternal Grandmother   . Ulcerative colitis Maternal Aunt     Social History   Tobacco Use  . Smoking status: Former Smoker    Packs/day: 0.50    Years: 15.00    Pack years: 7.50    Types: Cigars  . Smokeless tobacco:  Never Used  Vaping Use  . Vaping Use: Every day  . Start date: 08/15/2015  Substance Use Topics  . Alcohol use: No    Alcohol/week: 0.0 standard drinks  . Drug use: No    Home Medications Prior to Admission medications   Medication Sig Start Date End Date Taking? Authorizing Provider  carbamazepine (TEGRETOL) 100 MG chewable tablet Chew 1 tablet (100 mg total) by mouth 2 (two) times daily. For mood stabilization 05/29/19   Lindell Spar I, NP  celecoxib (CELEBREX) 200 MG capsule Take 1 capsule (200 mg total) by mouth 2 (two) times daily. 06/30/19   Margarita Mail,  PA-C  dicyclomine (BENTYL) 20 MG tablet Take 1 tablet (20 mg total) by mouth 4 (four) times daily as needed for spasms. 06/30/19   Margarita Mail, PA-C  FLUoxetine (PROZAC) 40 MG capsule Take 1 capsule (40 mg total) by mouth daily. For depression 05/30/19   Lindell Spar I, NP  loperamide (IMODIUM) 2 MG capsule Take 2 capsules (4 mg total) by mouth 4 (four) times daily as needed for diarrhea or loose stools. 09/23/19   Gareth Morgan, MD  nicotine (NICODERM CQ - DOSED IN MG/24 HOURS) 21 mg/24hr patch Place 1 patch (21 mg total) onto the skin daily. (May buy from over the counter): For smoking cessation Patient not taking: Reported on 06/17/2019 05/30/19   Lindell Spar I, NP  oxyCODONE (ROXICODONE) 5 MG immediate release tablet Take 1 tablet (5 mg total) by mouth every 8 (eight) hours as needed for severe pain. 09/30/19   Danasia Baker S, PA-C  pantoprazole (PROTONIX) 40 MG tablet Take 1 tablet (40 mg total) by mouth daily. For acid reflux 06/10/19 07/10/19  Arrien, Jimmy Picket, MD  predniSONE (DELTASONE) 20 MG tablet Take 60 mg by mouth daily with breakfast.    [provider]  psyllium (METAMUCIL SMOOTH TEXTURE) 58.6 % powder Take 1 packet by mouth 3 (three) times daily. 09/23/19   Gareth Morgan, MD  omeprazole (PRILOSEC) 40 MG capsule Take 1 capsule (40 mg total) by mouth daily. Patient not taking: Reported on 01/18/2018 10/17/16 08/07/18  Nita Sells, MD  sucralfate (CARAFATE) 1 g tablet Take 1 tablet (1 g total) by mouth 4 (four) times daily -  with meals and at bedtime. Patient not taking: Reported on 04/17/2018 01/18/18 08/07/18  Varney Biles, MD    Allergies    Patient has no known allergies.  Review of Systems   Review of Systems  Constitutional: Negative for chills and fever.  HENT: Negative for ear pain and sore throat.   Eyes: Negative for visual disturbance.  Respiratory: Negative for cough and shortness of breath.   Cardiovascular: Negative for chest pain.    Gastrointestinal: Positive for abdominal pain and nausea. Negative for constipation, diarrhea and vomiting.  Genitourinary: Negative for dysuria and hematuria.  Musculoskeletal: Negative for back pain.  Skin: Negative for rash.  Neurological: Negative for headaches.  All other systems reviewed and are negative.   Physical Exam Updated Vital Signs BP (!) 142/83   Pulse 66   Temp 98 F (36.7 C) (Oral)   Resp 15   SpO2 100%   Physical Exam Vitals and nursing note reviewed.  Constitutional:      Appearance: He is well-developed.  HENT:     Head: Normocephalic and atraumatic.  Eyes:     Conjunctiva/sclera: Conjunctivae normal.  Cardiovascular:     Rate and Rhythm: Normal rate and regular rhythm.     Heart sounds: Normal heart  sounds. No murmur heard.   Pulmonary:     Effort: Pulmonary effort is normal. No respiratory distress.     Breath sounds: Normal breath sounds. No wheezing, rhonchi or rales.  Abdominal:     General: Bowel sounds are normal.     Palpations: Abdomen is soft.     Comments: Ostomy noted to right mid abdomen, stoma appears pink/red. There is some mild TTP to the stoma and it appears to be easily reducible.  Musculoskeletal:     Cervical back: Neck supple.  Skin:    General: Skin is warm and dry.  Neurological:     Mental Status: He is alert.     ED Results / Procedures / Treatments   Labs (all labs ordered are listed, but only abnormal results are displayed) Labs Reviewed  COMPREHENSIVE METABOLIC PANEL - Abnormal; Notable for the following components:      Result Value   Glucose, Bld 113 (*)    ALT 53 (*)    All other components within normal limits  CBC    EKG None  Radiology CT ABDOMEN PELVIS W CONTRAST  Result Date: 09/30/2019 CLINICAL DATA:  Abdominal pain, acute nonlocalized. EXAM: CT ABDOMEN AND PELVIS WITH CONTRAST TECHNIQUE: Multidetector CT imaging of the abdomen and pelvis was performed using the standard protocol following bolus  administration of intravenous contrast. CONTRAST:  125m OMNIPAQUE IOHEXOL 300 MG/ML  SOLN COMPARISON:  Prior studies from 09/22/2019 and Jun 30, 2019 FINDINGS: Lower chest: Incidental imaging of the lung bases is unremarkable. Hepatobiliary: Liver without suspicious focal lesion or ductal dilation. No pericholecystic stranding. Portal vein is patent. Pancreas: Pancreas without ductal dilation or inflammation. Spleen: Spleen normal size and contour. Adrenals/Urinary Tract: Adrenal glands are normal. Symmetric renal enhancement. No hydronephrosis. Urinary bladder is decompressed but otherwise unremarkable. Stomach/Bowel: Stomach under distended limiting assessment. Small bowel with only mild distension of bowel loops in the RIGHT lower quadrant, no signs of obstruction. Following recent diverting and ileostomy and ileocectomy. Mild stranding about bowel loops in the lower abdomen without change. Vascular/Lymphatic: Vascular structures in the abdomen are patent. No upper abdominal adenopathy. No pelvic lymphadenopathy. Reproductive: Prostate unremarkable by CT. Other: Midline postoperative changes and changes of RIGHT lower quadrant ileostomy. No change in abdominal wall postoperative findings. No ascites. No abscess. No free air. Musculoskeletal: No acute musculoskeletal process. IMPRESSION: 1. Postoperative changes with little change since the prior study. Mild mesenteric stranding likely reflects postoperative state. Mildly patulous appearance without obstruction of distal small bowel loops is also unchanged. Findings could reflect mild enteritis though are not associated with mural stratification or bowel wall thickening or significant change from the previous study. 2. No abscess. 3. Postoperative changes of RIGHT lower quadrant ileostomy and presumed ileus seek ectomy. No change in abdominal wall postoperative findings. Electronically Signed   By: GZetta BillsM.D.   On: 09/30/2019 13:15     Procedures Procedures (including critical care time)  Medications Ordered in ED Medications  sodium chloride 0.9 % bolus 500 mL (0 mLs Intravenous Stopped 09/30/19 1248)  ondansetron (ZOFRAN) injection 4 mg (4 mg Intravenous Given 09/30/19 1124)  morphine 4 MG/ML injection 4 mg (4 mg Intravenous Given 09/30/19 1125)  sodium chloride 0.9 % bolus 500 mL (0 mLs Intravenous Stopped 09/30/19 1431)  iohexol (OMNIPAQUE) 300 MG/ML solution 100 mL (100 mLs Intravenous Contrast Given 09/30/19 1259)  HYDROcodone-acetaminophen (NORCO/VICODIN) 5-325 MG per tablet 1 tablet (1 tablet Oral Given 09/30/19 1331)    ED Course  I have reviewed  the triage vital signs and the nursing notes.  Pertinent labs & imaging results that were available during my care of the patient were reviewed by me and considered in my medical decision making (see chart for details).    MDM Rules/Calculators/A&P                           39 y/o male with recent ostomy creation presenting with abd pain at the stoma  Labs are reassuring. Ct abd/pelvis is reassuring. His stoma is easily reducible on exam and is only minimally tender.  He was given fluids, analgesics and antiemetics in the ED and had no episodes of vomiting.  He is tolerating p.o. prior to discharge.  There appears to be no complication in regards to his stomal prolapse and I advised that he contact his surgeon's office for follow-up for further evaluation of this.  Have advised on specific return precautions as well.  Will DC with pain medications.  Attempted to eprescribe pain meds but was unable to therefore rx was printed   Case was discussed with supervising physician, Dr. Ralene Bathe, who is in agreement with this plan.  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

## 2019-09-30 NOTE — ED Notes (Signed)
Pt called for VS, no response.

## 2019-09-30 NOTE — ED Notes (Signed)
Patient Alert and oriented to baseline. Stable and ambulatory to baseline. Patient verbalized understanding of the discharge instructions.  Patient belongings were taken by the patient.   

## 2020-03-26 DIAGNOSIS — R109 Unspecified abdominal pain: Secondary | ICD-10-CM | POA: Diagnosis not present

## 2020-03-26 DIAGNOSIS — K5090 Crohn's disease, unspecified, without complications: Secondary | ICD-10-CM | POA: Diagnosis not present

## 2020-03-26 DIAGNOSIS — Z7689 Persons encountering health services in other specified circumstances: Secondary | ICD-10-CM | POA: Diagnosis not present

## 2020-03-30 DIAGNOSIS — R109 Unspecified abdominal pain: Secondary | ICD-10-CM | POA: Diagnosis not present

## 2020-04-09 DIAGNOSIS — K5090 Crohn's disease, unspecified, without complications: Secondary | ICD-10-CM | POA: Diagnosis not present

## 2020-04-09 DIAGNOSIS — R1084 Generalized abdominal pain: Secondary | ICD-10-CM | POA: Diagnosis not present

## 2020-04-18 DIAGNOSIS — K50919 Crohn's disease, unspecified, with unspecified complications: Secondary | ICD-10-CM | POA: Diagnosis not present

## 2020-04-18 DIAGNOSIS — R12 Heartburn: Secondary | ICD-10-CM | POA: Diagnosis not present

## 2020-04-26 DIAGNOSIS — K50919 Crohn's disease, unspecified, with unspecified complications: Secondary | ICD-10-CM | POA: Diagnosis not present

## 2020-05-07 DIAGNOSIS — K5090 Crohn's disease, unspecified, without complications: Secondary | ICD-10-CM | POA: Diagnosis not present

## 2020-05-07 DIAGNOSIS — R1084 Generalized abdominal pain: Secondary | ICD-10-CM | POA: Diagnosis not present

## 2020-05-07 DIAGNOSIS — Z79899 Other long term (current) drug therapy: Secondary | ICD-10-CM | POA: Diagnosis not present

## 2020-05-30 DIAGNOSIS — K50919 Crohn's disease, unspecified, with unspecified complications: Secondary | ICD-10-CM | POA: Diagnosis not present

## 2020-06-04 DIAGNOSIS — K5090 Crohn's disease, unspecified, without complications: Secondary | ICD-10-CM | POA: Diagnosis not present

## 2020-06-04 DIAGNOSIS — R1084 Generalized abdominal pain: Secondary | ICD-10-CM | POA: Diagnosis not present

## 2020-06-04 DIAGNOSIS — Z79899 Other long term (current) drug therapy: Secondary | ICD-10-CM | POA: Diagnosis not present

## 2020-06-25 DIAGNOSIS — Z1211 Encounter for screening for malignant neoplasm of colon: Secondary | ICD-10-CM | POA: Diagnosis not present

## 2020-06-25 DIAGNOSIS — Z01818 Encounter for other preprocedural examination: Secondary | ICD-10-CM | POA: Diagnosis not present

## 2020-06-25 DIAGNOSIS — K529 Noninfective gastroenteritis and colitis, unspecified: Secondary | ICD-10-CM | POA: Diagnosis not present

## 2020-06-25 DIAGNOSIS — K50919 Crohn's disease, unspecified, with unspecified complications: Secondary | ICD-10-CM | POA: Diagnosis not present

## 2020-07-04 DIAGNOSIS — R1084 Generalized abdominal pain: Secondary | ICD-10-CM | POA: Diagnosis not present

## 2020-07-04 DIAGNOSIS — Z79899 Other long term (current) drug therapy: Secondary | ICD-10-CM | POA: Diagnosis not present

## 2020-07-04 DIAGNOSIS — K5090 Crohn's disease, unspecified, without complications: Secondary | ICD-10-CM | POA: Diagnosis not present

## 2020-07-11 DIAGNOSIS — K50919 Crohn's disease, unspecified, with unspecified complications: Secondary | ICD-10-CM | POA: Diagnosis not present

## 2020-07-17 DIAGNOSIS — K50919 Crohn's disease, unspecified, with unspecified complications: Secondary | ICD-10-CM | POA: Diagnosis not present

## 2020-07-25 DIAGNOSIS — Z79899 Other long term (current) drug therapy: Secondary | ICD-10-CM | POA: Diagnosis not present

## 2020-07-25 DIAGNOSIS — K50919 Crohn's disease, unspecified, with unspecified complications: Secondary | ICD-10-CM | POA: Diagnosis not present

## 2020-07-29 IMAGING — CR DG HUMERUS 2V *R*
2 series · 2 of 2 positions shown · non-contrast
Comparison: None.

CLINICAL DATA: Fell off balcony

EXAM:
RIGHT HUMERUS - 2+ VIEW

[x humerus ap right]
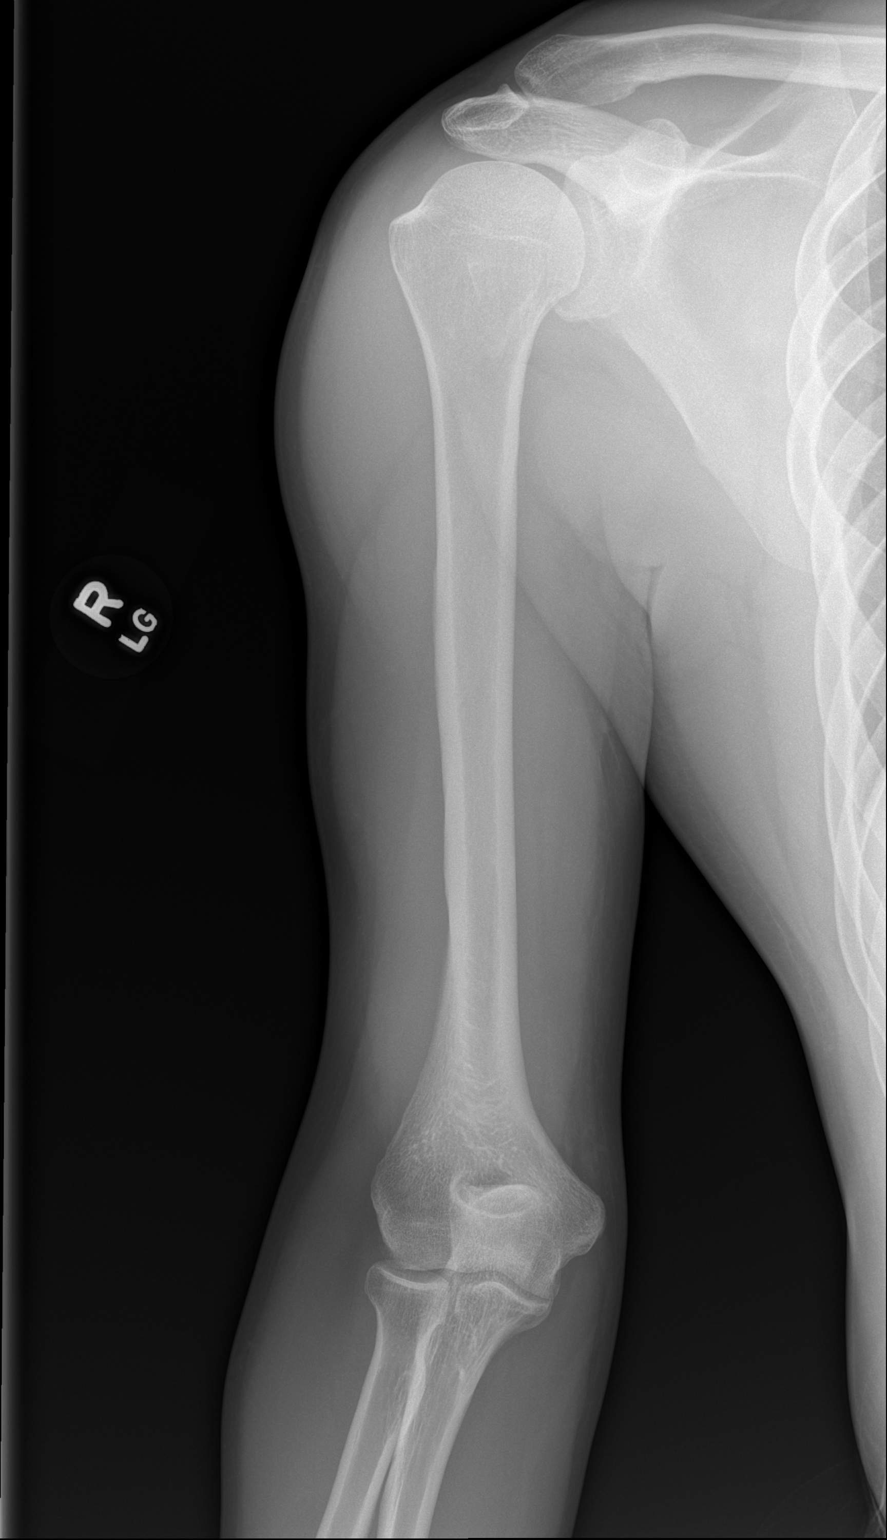

[x humerus lat right]
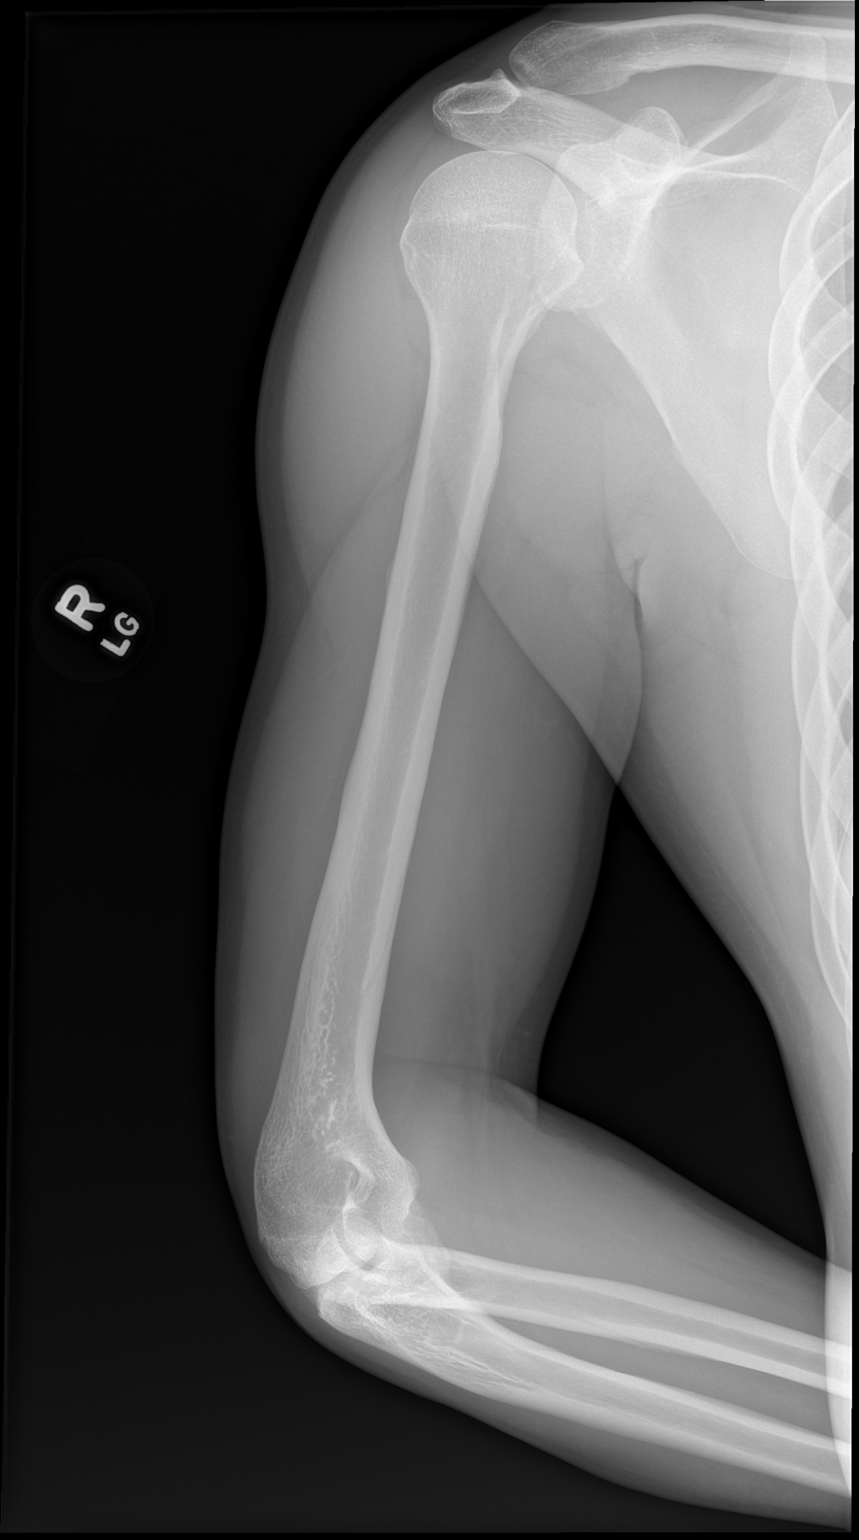

[2 of 2 positions shown; findings below may reference images not displayed]

FINDINGS: There is no evidence of fracture or other focal bone lesions. Soft
tissues are unremarkable.
IMPRESSION: Negative.

## 2020-08-27 IMAGING — CR DG ABDOMEN ACUTE W/ 1V CHEST
4 series · 4 of 4 positions shown · non-contrast
Comparison: Abdominal x-ray dated June 09, 2019.

CLINICAL DATA: Right-sided abdominal pain. History of Crohn's
disease.

EXAM:
DG ABDOMEN ACUTE W/ 1V CHEST

[w chest pa]
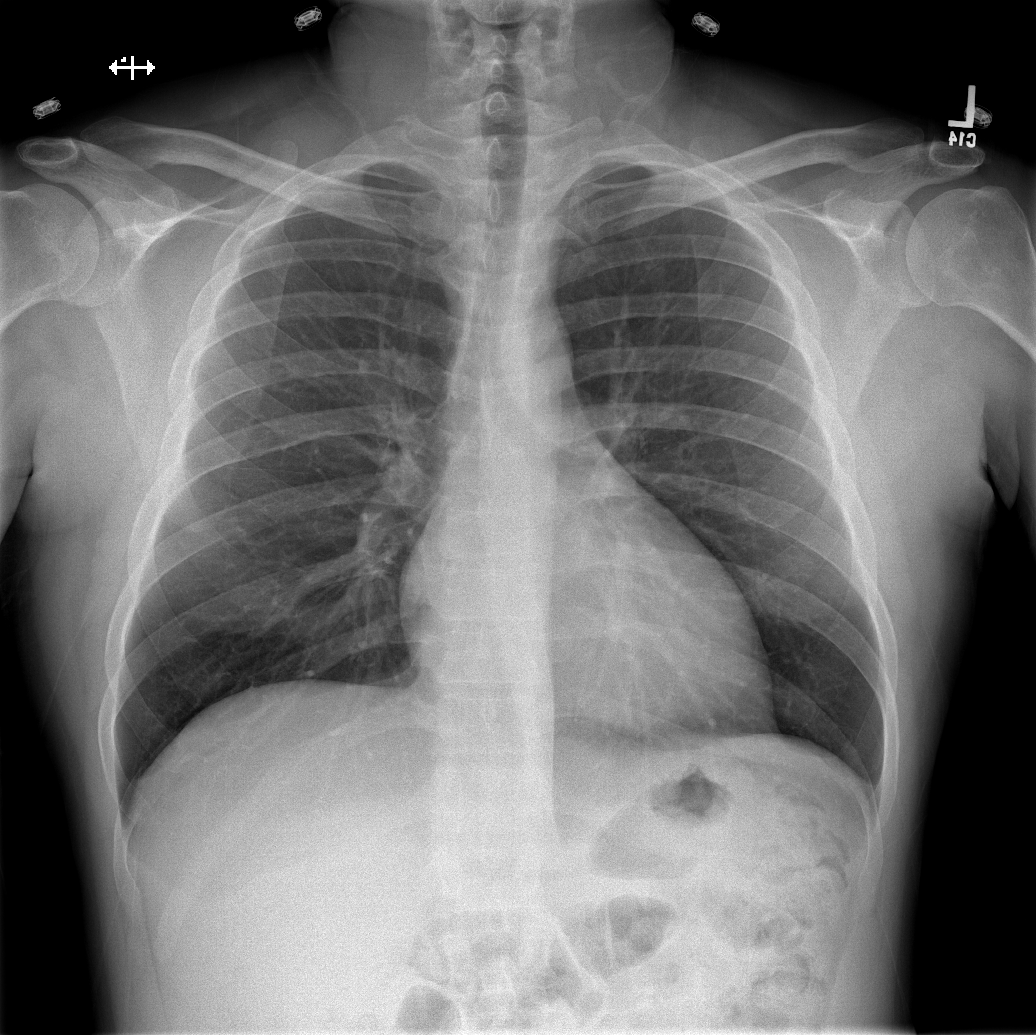

[w abdomen upright]
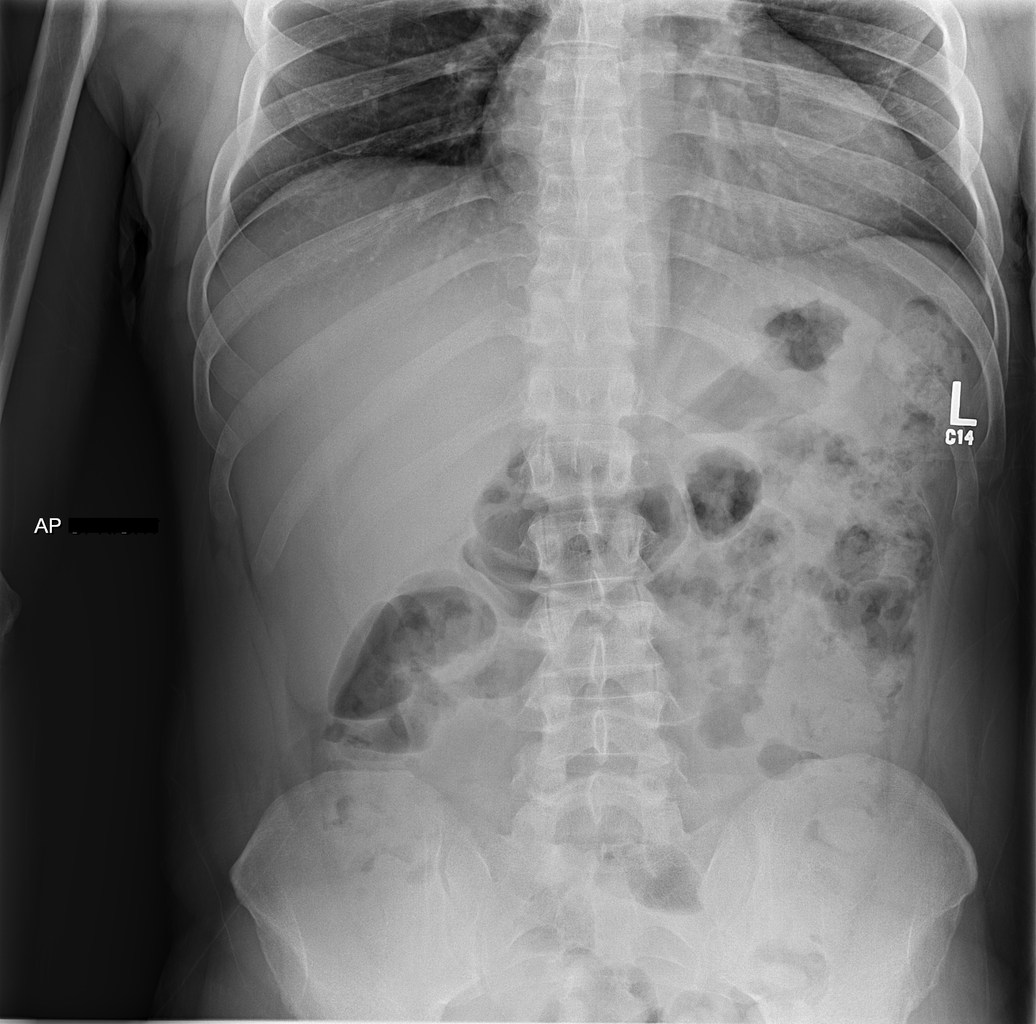

[t abdomen supine (1 of 2)]
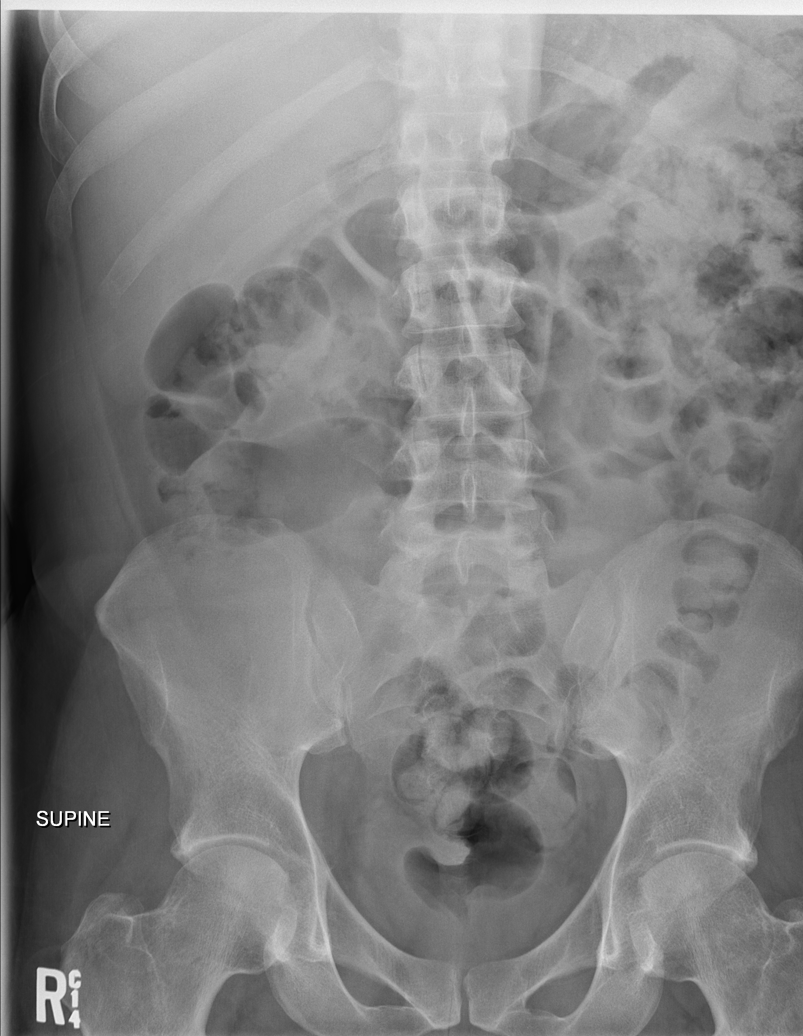

[t abdomen supine (2 of 2)]
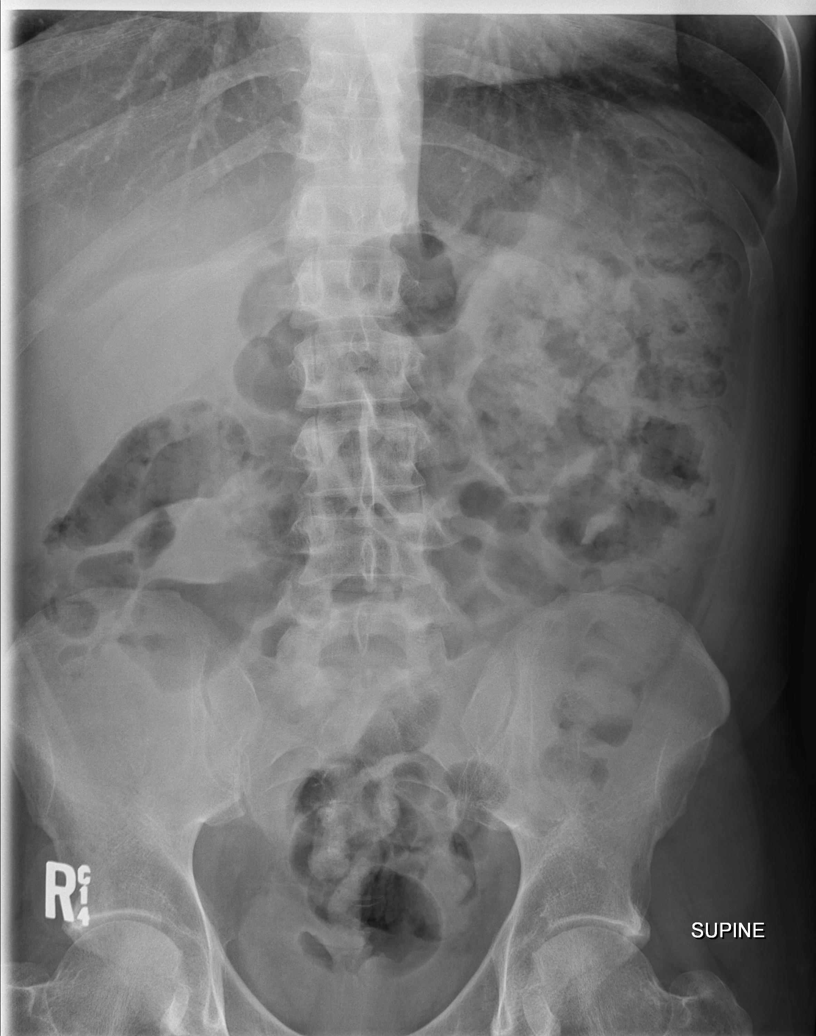

[4 of 4 positions shown; findings below may reference images not displayed]

FINDINGS: There are few prominent loops of small bowel in the right abdomen,
similar to prior study. No obstruction or free intraperitoneal air.
No radiopaque calculi or other significant radiographic abnormality
is seen. Heart size and mediastinal contours are within normal
limits. Both lungs are clear.
IMPRESSION: 1. Unchanged prominent loops of small bowel in the right abdomen. No
obstruction.
2.  No active cardiopulmonary disease.

## 2020-09-10 DIAGNOSIS — K50919 Crohn's disease, unspecified, with unspecified complications: Secondary | ICD-10-CM | POA: Diagnosis not present

## 2020-10-19 DIAGNOSIS — R1031 Right lower quadrant pain: Secondary | ICD-10-CM | POA: Diagnosis not present

## 2020-10-19 DIAGNOSIS — K911 Postgastric surgery syndromes: Secondary | ICD-10-CM | POA: Diagnosis not present

## 2020-10-19 DIAGNOSIS — Z13220 Encounter for screening for lipoid disorders: Secondary | ICD-10-CM | POA: Diagnosis not present

## 2020-10-19 DIAGNOSIS — K5090 Crohn's disease, unspecified, without complications: Secondary | ICD-10-CM | POA: Diagnosis not present

## 2021-02-07 ENCOUNTER — Emergency Department (HOSPITAL_COMMUNITY)
Admission: EM | Admit: 2021-02-07 | Discharge: 2021-02-07 | Disposition: A | Discharge status: Home / Self Care | Payer: Medicare Other | Attending: Emergency Medicine | Admitting: Emergency Medicine

## 2021-02-07 ENCOUNTER — Encounter (HOSPITAL_COMMUNITY): Payer: Self-pay

## 2021-02-07 DIAGNOSIS — R1011 Right upper quadrant pain: Secondary | ICD-10-CM | POA: Insufficient documentation

## 2021-02-07 DIAGNOSIS — Z5321 Procedure and treatment not carried out due to patient leaving prior to being seen by health care provider: Secondary | ICD-10-CM | POA: Diagnosis not present

## 2021-02-07 LAB — CBC
HCT: 39.5 % (ref 39.0–52.0)
Hemoglobin: 13.9 g/dL (ref 13.0–17.0)
MCH: 31.3 pg (ref 26.0–34.0)
MCHC: 35.2 g/dL (ref 30.0–36.0)
MCV: 89 fL (ref 80.0–100.0)
Platelets: 254 10*3/uL (ref 150–400)
RBC: 4.44 MIL/uL (ref 4.22–5.81)
RDW: 13.8 % (ref 11.5–15.5)
WBC: 9.9 10*3/uL (ref 4.0–10.5)
nRBC: 0 % (ref 0.0–0.2)

## 2021-02-07 LAB — COMPREHENSIVE METABOLIC PANEL
ALT: 22 U/L (ref 0–44)
AST: 19 U/L (ref 15–41)
Albumin: 4.2 g/dL (ref 3.5–5.0)
Alkaline Phosphatase: 105 U/L (ref 38–126)
Anion gap: 9 (ref 5–15)
BUN: 9 mg/dL (ref 6–20)
CO2: 25 mmol/L (ref 22–32)
Calcium: 8.9 mg/dL (ref 8.9–10.3)
Chloride: 107 mmol/L (ref 98–111)
Creatinine, Ser: 0.83 mg/dL (ref 0.61–1.24)
GFR, Estimated: >60 mL/min (ref >60)
Glucose, Bld: 85 mg/dL (ref 70–99)
Potassium: 3.1 mmol/L — ABNORMAL LOW (ref 3.5–5.1)
Sodium: 141 mmol/L (ref 135–145)
Total Bilirubin: 0.5 mg/dL (ref 0.3–1.2)
Total Protein: 7.1 g/dL (ref 6.5–8.1)

## 2021-02-07 LAB — URINALYSIS, ROUTINE W REFLEX MICROSCOPIC
Bilirubin Urine: NEGATIVE
Glucose, UA: NEGATIVE mg/dL
Hgb urine dipstick: NEGATIVE
Ketones, ur: NEGATIVE mg/dL
Leukocytes,Ua: NEGATIVE
Nitrite: NEGATIVE
Protein, ur: NEGATIVE mg/dL
Specific Gravity, Urine: 1.011 (ref 1.005–1.030)
pH: 6 (ref 5.0–8.0)

## 2021-02-07 LAB — LIPASE, BLOOD: Lipase: 36 U/L (ref 11–51)

## 2021-02-07 NOTE — ED Triage Notes (Signed)
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 History of Chrons, states this does not feel like a normal flare up. Declines any NVD.

## 2021-03-06 ENCOUNTER — Encounter (HOSPITAL_COMMUNITY): Payer: Self-pay | Admitting: Emergency Medicine

## 2021-03-06 ENCOUNTER — Emergency Department (HOSPITAL_COMMUNITY): Payer: Medicare Other

## 2021-03-06 ENCOUNTER — Emergency Department (HOSPITAL_COMMUNITY)
Admission: EM | Admit: 2021-03-06 | Discharge: 2021-03-07 | Disposition: A | Discharge status: Home / Self Care | Payer: Medicare Other | Attending: Physician Assistant | Admitting: Physician Assistant

## 2021-03-06 ENCOUNTER — Other Ambulatory Visit: Payer: Self-pay

## 2021-03-06 DIAGNOSIS — R1084 Generalized abdominal pain: Secondary | ICD-10-CM | POA: Diagnosis not present

## 2021-03-06 DIAGNOSIS — Z5321 Procedure and treatment not carried out due to patient leaving prior to being seen by health care provider: Secondary | ICD-10-CM | POA: Diagnosis not present

## 2021-03-06 DIAGNOSIS — R109 Unspecified abdominal pain: Secondary | ICD-10-CM | POA: Diagnosis not present

## 2021-03-06 DIAGNOSIS — R197 Diarrhea, unspecified: Secondary | ICD-10-CM | POA: Diagnosis not present

## 2021-03-06 LAB — COMPREHENSIVE METABOLIC PANEL
ALT: 36 U/L (ref 0–44)
AST: 30 U/L (ref 15–41)
Albumin: 4.1 g/dL (ref 3.5–5.0)
Alkaline Phosphatase: 108 U/L (ref 38–126)
Anion gap: 10 (ref 5–15)
BUN: 8 mg/dL (ref 6–20)
CO2: 28 mmol/L (ref 22–32)
Calcium: 8.8 mg/dL — ABNORMAL LOW (ref 8.9–10.3)
Chloride: 103 mmol/L (ref 98–111)
Creatinine, Ser: 0.92 mg/dL (ref 0.61–1.24)
GFR, Estimated: >60 mL/min (ref >60)
Glucose, Bld: 111 mg/dL — ABNORMAL HIGH (ref 70–99)
Potassium: 3.6 mmol/L (ref 3.5–5.1)
Sodium: 141 mmol/L (ref 135–145)
Total Bilirubin: 0.1 mg/dL — ABNORMAL LOW (ref 0.3–1.2)
Total Protein: 7.6 g/dL (ref 6.5–8.1)

## 2021-03-06 LAB — CBC WITH DIFFERENTIAL/PLATELET
Abs Immature Granulocytes: 0.03 10*3/uL (ref 0.00–0.07)
Basophils Absolute: 0 10*3/uL (ref 0.0–0.1)
Basophils Relative: 0 %
Eosinophils Absolute: 0.1 10*3/uL (ref 0.0–0.5)
Eosinophils Relative: 2 %
HCT: 43.9 % (ref 39.0–52.0)
Hemoglobin: 15.1 g/dL (ref 13.0–17.0)
Immature Granulocytes: 0 %
Lymphocytes Relative: 35 %
Lymphs Abs: 3.2 10*3/uL (ref 0.7–4.0)
MCH: 30.1 pg (ref 26.0–34.0)
MCHC: 34.4 g/dL (ref 30.0–36.0)
MCV: 87.5 fL (ref 80.0–100.0)
Monocytes Absolute: 0.9 10*3/uL (ref 0.1–1.0)
Monocytes Relative: 10 %
Neutro Abs: 4.9 10*3/uL (ref 1.7–7.7)
Neutrophils Relative %: 53 %
Platelets: 316 10*3/uL (ref 150–400)
RBC: 5.02 MIL/uL (ref 4.22–5.81)
RDW: 13.5 % (ref 11.5–15.5)
WBC: 9.2 10*3/uL (ref 4.0–10.5)
nRBC: 0 % (ref 0.0–0.2)

## 2021-03-06 LAB — LIPASE, BLOOD: Lipase: 38 U/L (ref 11–51)

## 2021-03-06 MED ORDER — IOHEXOL 300 MG/ML  SOLN
100.0000 mL | Freq: Once | INTRAMUSCULAR | Status: AC | PRN
  Administered 2021-03-06: 100 mL via INTRAVENOUS

## 2021-03-06 NOTE — ED Triage Notes (Signed)
Sudden onset LUQ abdominal pain, states he was sitting at a computer and 'felt like something popped.'

## 2021-03-06 NOTE — ED Provider Triage Note (Signed)
Emergency Medicine Provider Triage Evaluation Note  Jeffery Mcintosh , a 41 y.o. male  was evaluated in triage.  Pt complains of abd pain.  Generalized in nature.  History of Crohn's with multiple prior abdominal surgeries.  States he has chronic diarrhea, typically goes over 10 times daily however last bowel movement this morning, only once today which is unusual for him.  No melena or blood per rectum.  Has had prior history of bowel obstructions, states feels similar.  No fever.  Pain goes into his back.  No history of AAA or dissection.  No fever, urinary complaints.  Review of Systems  Positive: Abdominal pain Negative: Fever  Physical Exam  There were no vitals taken for this visit. Gen:   Awake, no distress   Resp:  Normal effort  ABD:  Diffusely tender, hypoactive bowel sounds, mild guarding without rebound, old abdominal incisions without surrounding erythema or warmth MSK:   Moves extremities without difficulty  Other:    Medical Decision Making  Medically screening exam initiated at 6:04 PM.  Appropriate orders placed.  Celester Boughner was informed that the remainder of the evaluation will be completed by another provider, this initial triage assessment does not replace that evaluation, and the importance of remaining in the ED until their evaluation is complete.  Abdominal pain, multiple prior abdominal surgeries, Crohn's, decrease BM, will obtain labs, imaging   Macy Polio A, PA-C 03/06/21 1808

## 2021-03-07 DIAGNOSIS — R1084 Generalized abdominal pain: Secondary | ICD-10-CM | POA: Diagnosis not present

## 2021-03-07 LAB — URINALYSIS, ROUTINE W REFLEX MICROSCOPIC
Bilirubin Urine: NEGATIVE
Glucose, UA: NEGATIVE mg/dL
Hgb urine dipstick: NEGATIVE
Ketones, ur: NEGATIVE mg/dL
Leukocytes,Ua: NEGATIVE
Nitrite: NEGATIVE
Protein, ur: NEGATIVE mg/dL
Specific Gravity, Urine: <1.005 — ABNORMAL LOW (ref 1.005–1.030)
pH: 6 (ref 5.0–8.0)

## 2021-09-26 ENCOUNTER — Ambulatory Visit: Payer: Self-pay

## 2021-09-26 NOTE — Patient Outreach (Signed)
  Care Coordination   09/26/2021 Name: Jeffery Mcintosh MRN: [id number] DOB: 04/30/1980   Care Coordination Outreach Attempts:  An unsuccessful telephone outreach was attempted today to offer the patient information about available care coordination services as a benefit of their health plan.   Follow Up Plan:  Additional outreach attempts will be made to offer the patient care coordination information and services.   Encounter Outcome:  No Answer  Care Coordination Interventions Activated:  No   Care Coordination Interventions:  No, not indicated    [place] [facility] [phone]

## 2021-11-15 ENCOUNTER — Telehealth: Payer: Self-pay

## 2021-11-15 NOTE — Patient Outreach (Signed)
Care Coordination

## 2022-02-28 DIAGNOSIS — I700 Atherosclerosis of aorta: Secondary | ICD-10-CM | POA: Diagnosis not present

## 2022-02-28 DIAGNOSIS — K5090 Crohn's disease, unspecified, without complications: Secondary | ICD-10-CM | POA: Diagnosis not present

## 2022-02-28 DIAGNOSIS — F17210 Nicotine dependence, cigarettes, uncomplicated: Secondary | ICD-10-CM | POA: Diagnosis not present

## 2022-02-28 DIAGNOSIS — Z0000 Encounter for general adult medical examination without abnormal findings: Secondary | ICD-10-CM | POA: Diagnosis not present

## 2022-05-13 ENCOUNTER — Telehealth: Payer: Self-pay | Admitting: Urology

## 2022-05-13 ENCOUNTER — Ambulatory Visit (INDEPENDENT_AMBULATORY_CARE_PROVIDER_SITE_OTHER): Payer: 59 | Admitting: Podiatry

## 2022-05-13 ENCOUNTER — Ambulatory Visit (INDEPENDENT_AMBULATORY_CARE_PROVIDER_SITE_OTHER): Payer: 59

## 2022-05-13 ENCOUNTER — Encounter: Payer: Self-pay | Admitting: Podiatry

## 2022-05-13 DIAGNOSIS — M2042 Other hammer toe(s) (acquired), left foot: Secondary | ICD-10-CM

## 2022-05-13 DIAGNOSIS — M898X7 Other specified disorders of bone, ankle and foot: Secondary | ICD-10-CM

## 2022-05-13 DIAGNOSIS — D2372 Other benign neoplasm of skin of left lower limb, including hip: Secondary | ICD-10-CM | POA: Diagnosis not present

## 2022-05-13 NOTE — Telephone Encounter (Signed)
DOS - 05/23/22  EXOSTECTOMY 5TH LEFT --- [address]  [employer] EFFECTIVE DATE - 02/03/22   DEDUCTIBLE - $240.00 W/ $187.27 REMAINING OOP - $8,850.00 W/ $1,572.62 COINSURANCE - 20%   PER [employer] WEBSITE FOR CPT CODE 03559 Notification or Prior Authorization is not required for the requested services  You are not required to submit a notification/prior authorization based on the information provided. The number above acknowledges your inquiry and our response. Please write this number down and refer to it for future inquiries. If you still wish to submit your request for review, please select the Continue with Submission button below.   Decision ID #: [id number]

## 2022-05-13 NOTE — Progress Notes (Signed)
  Subjective:  Patient ID: Jeffery Mcintosh, male    DOB: December 31, 1980,  MRN: [id number] HPI Chief Complaint  Patient presents with   Toe Pain    5th toe left - lateral border of nail, tender thickened area x 3 months, no treatment   New Patient (Initial Visit)    42 y.o. male presents with the above complaint.   ROS: Denies fever chills nausea vomit muscle aches pains calf pain back pain chest pain shortness of breath.  Past Medical History:  Diagnosis Date   Anxiety    Arthritis    Bipolar affective    Chronic headaches    Crohn's disease history of   Depression    GERD (gastroesophageal reflux disease)    Hypertension    Noncompliance 04/17/2018   PTSD (post-traumatic stress disorder) 04/17/2018   Schizophrenic disorder    Past Surgical History:  Procedure Laterality Date   ILEOSTOMY     NO PAST SURGERIES      Current Outpatient Medications:    amantadine (SYMMETREL) 100 MG capsule, Take 100 mg by mouth 2 (two) times daily., Disp: , Rfl:    amphetamine-dextroamphetamine (ADDERALL XR) 15 MG 24 hr capsule, Take 15 mg by mouth every morning., Disp: , Rfl:    gabapentin (NEURONTIN) 300 MG capsule, Take by mouth., Disp: , Rfl:   No Known Allergies Review of Systems Objective:  There were no vitals filed for this visit.  General: Well developed, nourished, in no acute distress, alert and oriented x3   Dermatological: Skin is warm, dry and supple bilateral. Nails x 10 are well maintained; remaining integument appears unremarkable at this time. There are no open sores, no preulcerative lesions, no rash or signs of infection present.  Vascular: Dorsalis Pedis artery and Posterior Tibial artery pedal pulses are 2/4 bilateral with immedate capillary fill time. Pedal hair growth present. No varicosities and no lower extremity edema present bilateral.   Neruologic: Grossly intact via light touch bilateral. Vibratory intact via tuning fork bilateral. Protective threshold with  Semmes Wienstein monofilament intact to all pedal sites bilateral. Patellar and Achilles deep tendon reflexes 2+ bilateral. No Babinski or clonus noted bilateral.   Musculoskeletal: No gross boney pedal deformities bilateral. No pain, crepitus, or limitation noted with foot and ankle range of motion bilateral. Muscular strength 5/5 in all groups tested bilateral.  Abducted rotated fifth digit left foot with painful distal lateral exostosis and benign skin lesion  Gait: Unassisted, Nonantalgic.    Radiographs:  Radiographs taken today demonstrate hammertoe deformity fifth left with an exostosis to the distal phalanx fifth digit left foot.  Assessment & Plan:   Assessment: Painful hammertoe deformity with benign skin lesion distally.  Plan: Consented him today for surgical and exostectomy fifth digit left foot.  We discussed some possible postop complications which may include but not limited to postop pain bleeding swell infection recurrence need for further surgery.  Debrided reactive hyperkeratotic lesion today apply silicone padding.     Jeffery Mcintosh T. [place], [place]

## 2022-05-21 ENCOUNTER — Other Ambulatory Visit: Payer: Self-pay | Admitting: Podiatry

## 2022-05-21 MED ORDER — CEPHALEXIN 500 MG PO CAPS: 500.0000 mg | ORAL_CAPSULE | Freq: Three times a day (TID) | ORAL | 0 refills | Status: AC

## 2022-05-21 MED ORDER — HYDROCODONE-ACETAMINOPHEN 10-325 MG PO TABS: 1.0000 | ORAL_TABLET | Freq: Four times a day (QID) | ORAL | 0 refills | Status: DC | PRN

## 2022-05-21 MED ORDER — ONDANSETRON HCL 4 MG PO TABS: 4.0000 mg | ORAL_TABLET | Freq: Three times a day (TID) | ORAL | 0 refills | Status: AC | PRN

## 2022-05-23 DIAGNOSIS — M25775 Osteophyte, left foot: Secondary | ICD-10-CM | POA: Diagnosis not present

## 2022-05-23 DIAGNOSIS — M899 Disorder of bone, unspecified: Secondary | ICD-10-CM | POA: Diagnosis not present

## 2022-05-23 DIAGNOSIS — M85872 Other specified disorders of bone density and structure, left ankle and foot: Secondary | ICD-10-CM | POA: Diagnosis not present

## 2022-05-24 ENCOUNTER — Other Ambulatory Visit: Payer: Self-pay | Admitting: Podiatry

## 2022-05-24 MED ORDER — OXYCODONE-ACETAMINOPHEN 5-325 MG PO TABS: 1.0000 | ORAL_TABLET | ORAL | 0 refills | Status: AC | PRN

## 2022-05-24 NOTE — Progress Notes (Signed)
PRN postop. Change in therapy. Patient called this AM requesting oxycodone instead of hydrocodone. Rx sent. DC norco10/325mg .   Felecia Shelling, DPM [facility]  Dr. Felecia Shelling, DPM    [address] [place], [place] [address]                Office [phone]  Fax [phone]

## 2022-05-29 ENCOUNTER — Ambulatory Visit (INDEPENDENT_AMBULATORY_CARE_PROVIDER_SITE_OTHER): Payer: 59

## 2022-05-29 ENCOUNTER — Ambulatory Visit (INDEPENDENT_AMBULATORY_CARE_PROVIDER_SITE_OTHER): Payer: 59 | Admitting: Podiatry

## 2022-05-29 DIAGNOSIS — M898X7 Other specified disorders of bone, ankle and foot: Secondary | ICD-10-CM | POA: Diagnosis not present

## 2022-05-29 DIAGNOSIS — Z9889 Other specified postprocedural states: Secondary | ICD-10-CM

## 2022-05-29 MED ORDER — OXYCODONE-ACETAMINOPHEN 5-325 MG PO TABS: 1.0000 | ORAL_TABLET | ORAL | 0 refills | Status: AC | PRN

## 2022-05-29 NOTE — Progress Notes (Signed)
  Subjective:  Patient ID: Jeffery Mcintosh, male    DOB: 03-Jul-1980,  MRN: [id number]  Chief Complaint  Patient presents with   Routine Post Op    POV #1 DOS 05/23/2022 LT FOOT EXOSTECTOMY 5TH TOE -LATERAL SIDE/  Pt stated that he is doing okay he does have some pain and discomfort and would like a refill on his pain medication      42 y.o. male returns for post-op check.  He is doing okay.  He is known to Dr. Al Corpus  Review of Systems: Negative except as noted in the HPI. Denies N/V/F/Ch.  Past Medical History:  Diagnosis Date   Anxiety    Arthritis    Bipolar affective ([facility])    Chronic headaches    Crohn's disease ([facility]) history of   Depression    GERD (gastroesophageal reflux disease)    Hypertension    Noncompliance 04/17/2018   PTSD (post-traumatic stress disorder) 04/17/2018   Schizophrenic disorder ([facility])     Current Outpatient Medications:    oxyCODONE-acetaminophen (PERCOCET) 5-325 MG tablet, Take 1 tablet by mouth every 4 (four) hours as needed for severe pain., Disp: 20 tablet, Rfl: 0   oxyCODONE-acetaminophen (PERCOCET) 5-325 MG tablet, Take 1 tablet by mouth every 4 (four) hours as needed for severe pain., Disp: 30 tablet, Rfl: 0   amantadine (SYMMETREL) 100 MG capsule, Take 100 mg by mouth 2 (two) times daily., Disp: , Rfl:    amphetamine-dextroamphetamine (ADDERALL XR) 15 MG 24 hr capsule, Take 15 mg by mouth every morning., Disp: , Rfl:    cephALEXin (KEFLEX) 500 MG capsule, Take 1 capsule (500 mg total) by mouth 3 (three) times daily., Disp: 30 capsule, Rfl: 0   gabapentin (NEURONTIN) 300 MG capsule, Take by mouth., Disp: , Rfl:    ondansetron (ZOFRAN) 4 MG tablet, Take 1 tablet (4 mg total) by mouth every 8 (eight) hours as needed., Disp: 20 tablet, Rfl: 0  Social History   Tobacco Use  Smoking Status Former   Packs/day: 0.50   Years: 15.00   Additional pack years: 0.00   Total pack years: 7.50   Types: Cigars, Cigarettes  Smokeless Tobacco Never     No Known Allergies Objective:  There were no vitals filed for this visit. There is no height or weight on file to calculate BMI. Constitutional Well developed. Well nourished.  Vascular Foot warm and well perfused. Capillary refill normal to all digits.   Neurologic Normal speech. Oriented to person, place, and time. Epicritic sensation to light touch grossly present bilaterally.  Dermatologic Skin healing well without signs of infection. Skin edges well coapted without signs of infection.  Orthopedic: Tenderness to palpation noted about the surgical site.   Radiographs: None Assessment:   1. Exostosis of toe   2. Status post foot surgery    Plan:  Patient was evaluated and treated and all questions answered.  S/p foot surgery left -Progressing as expected post-operatively. -XR: See above -WB Status: Weightbearing as tolerated in surgical shoe -Sutures: Intact.  No clinical signs of Deis is noted no complication noted. -Medications: Percocet -Foot redressed.  No follow-ups on file.

## 2022-06-12 ENCOUNTER — Ambulatory Visit (INDEPENDENT_AMBULATORY_CARE_PROVIDER_SITE_OTHER): Payer: 59

## 2022-06-12 ENCOUNTER — Ambulatory Visit (INDEPENDENT_AMBULATORY_CARE_PROVIDER_SITE_OTHER): Payer: 59 | Admitting: Podiatry

## 2022-06-12 VITALS — BP 140/81 | HR 71 | Temp 97.5°F | Resp 17

## 2022-06-12 DIAGNOSIS — T8140XA Infection following a procedure, unspecified, initial encounter: Secondary | ICD-10-CM

## 2022-06-12 DIAGNOSIS — M898X7 Other specified disorders of bone, ankle and foot: Secondary | ICD-10-CM

## 2022-06-12 DIAGNOSIS — Z9889 Other specified postprocedural states: Secondary | ICD-10-CM

## 2022-06-12 MED ORDER — AMOXICILLIN-POT CLAVULANATE 500-125 MG PO TABS: 1.0000 | ORAL_TABLET | Freq: Three times a day (TID) | ORAL | 0 refills | Status: AC

## 2022-06-12 MED ORDER — OXYCODONE-ACETAMINOPHEN 10-325 MG PO TABS
1.0000 | ORAL_TABLET | Freq: Three times a day (TID) | ORAL | 0 refills | Status: AC | PRN
Start: 2022-06-12 — End: 2022-06-19

## 2022-06-12 NOTE — Progress Notes (Signed)
He presents today date of surgery 05/23/2022 left foot exostectomy fifth toe.  States that he has been having pain which is about a 9 out of 10 he states is throbbing and swelling states that he has been up on it a fair bit presents today wearing his closed house slipper.  He denies fever chills nausea vomit muscle aches and pains states that the toe just hurts as it throbs.  No other part of the foot hurts.  No calf pain.  Objective: Vital signs are stable BP is 140/81 temperature is 97.5.  And he is oxygenating at 99%.  The toe is mildly ecchymotic sutures are intact no purulence no malodor but the patient does state that he is also blowing couple days ago.  Assessment: Possible postop infection though vital signs demonstrated does not appear to have any purulence no signs of infection appears to be more ecchymotic.  Plan: Started him on Augmentin 3 times a day and also refilled his pain medication.  He was started Epsom salts on the left side.

## 2022-06-19 ENCOUNTER — Encounter: Payer: Self-pay | Admitting: Podiatry

## 2022-06-19 ENCOUNTER — Ambulatory Visit (INDEPENDENT_AMBULATORY_CARE_PROVIDER_SITE_OTHER): Payer: 59 | Admitting: Podiatry

## 2022-06-19 DIAGNOSIS — M898X7 Other specified disorders of bone, ankle and foot: Secondary | ICD-10-CM

## 2022-06-19 MED ORDER — OXYCODONE-ACETAMINOPHEN 10-325 MG PO TABS: 1.0000 | ORAL_TABLET | Freq: Three times a day (TID) | ORAL | 0 refills | Status: AC | PRN

## 2022-06-19 NOTE — Progress Notes (Signed)
He presents today date of surgery 05/23/2018 for exostectomy fifth toe left.  States that is still really sore particular on the bottom manage the outside part of the majority of the procedures performed is not is not painful.  The toe is much decrease in edema from last visit and the hyperpigmentation is diminished.  Assessment: Well-healing surgical toe.  Plan: Pain associated with surgery.  I will allow him to start putting some Neosporin on this and he will continue the use of the [employer] shoe as needed.

## 2022-07-03 ENCOUNTER — Encounter: Payer: 59 | Admitting: Podiatry

## 2022-07-04 ENCOUNTER — Other Ambulatory Visit: Payer: Self-pay | Admitting: Podiatry

## 2022-07-04 DIAGNOSIS — T8140XA Infection following a procedure, unspecified, initial encounter: Secondary | ICD-10-CM

## 2022-07-04 DIAGNOSIS — M898X7 Other specified disorders of bone, ankle and foot: Secondary | ICD-10-CM

## 2022-07-04 DIAGNOSIS — Z9889 Other specified postprocedural states: Secondary | ICD-10-CM

## 2023-02-26 DIAGNOSIS — Z0001 Encounter for general adult medical examination with abnormal findings: Secondary | ICD-10-CM | POA: Diagnosis not present

## 2023-02-26 DIAGNOSIS — Z131 Encounter for screening for diabetes mellitus: Secondary | ICD-10-CM | POA: Diagnosis not present

## 2023-02-26 DIAGNOSIS — Z136 Encounter for screening for cardiovascular disorders: Secondary | ICD-10-CM | POA: Diagnosis not present

## 2023-02-26 DIAGNOSIS — R110 Nausea: Secondary | ICD-10-CM | POA: Diagnosis not present

## 2023-02-26 DIAGNOSIS — R519 Headache, unspecified: Secondary | ICD-10-CM | POA: Diagnosis not present

## 2023-02-26 DIAGNOSIS — Z13220 Encounter for screening for lipoid disorders: Secondary | ICD-10-CM | POA: Diagnosis not present

## 2023-02-26 DIAGNOSIS — R109 Unspecified abdominal pain: Secondary | ICD-10-CM | POA: Diagnosis not present

## 2023-02-26 DIAGNOSIS — R638 Other symptoms and signs concerning food and fluid intake: Secondary | ICD-10-CM | POA: Diagnosis not present

## 2023-02-26 DIAGNOSIS — K5090 Crohn's disease, unspecified, without complications: Secondary | ICD-10-CM | POA: Diagnosis not present

## 2023-02-27 DIAGNOSIS — Z136 Encounter for screening for cardiovascular disorders: Secondary | ICD-10-CM | POA: Diagnosis not present

## 2023-02-27 DIAGNOSIS — Z0001 Encounter for general adult medical examination with abnormal findings: Secondary | ICD-10-CM | POA: Diagnosis not present

## 2023-02-27 DIAGNOSIS — K5090 Crohn's disease, unspecified, without complications: Secondary | ICD-10-CM | POA: Diagnosis not present

## 2023-02-27 DIAGNOSIS — Z131 Encounter for screening for diabetes mellitus: Secondary | ICD-10-CM | POA: Diagnosis not present

## 2023-03-19 DIAGNOSIS — K5090 Crohn's disease, unspecified, without complications: Secondary | ICD-10-CM | POA: Diagnosis not present

## 2023-03-19 DIAGNOSIS — R110 Nausea: Secondary | ICD-10-CM | POA: Diagnosis not present

## 2023-03-19 DIAGNOSIS — Z7185 Encounter for immunization safety counseling: Secondary | ICD-10-CM | POA: Diagnosis not present

## 2023-03-19 DIAGNOSIS — K219 Gastro-esophageal reflux disease without esophagitis: Secondary | ICD-10-CM | POA: Diagnosis not present

## 2023-04-14 DIAGNOSIS — Z0001 Encounter for general adult medical examination with abnormal findings: Secondary | ICD-10-CM | POA: Diagnosis not present

## 2023-04-14 DIAGNOSIS — E038 Other specified hypothyroidism: Secondary | ICD-10-CM | POA: Diagnosis not present

## 2023-04-20 DIAGNOSIS — R7303 Prediabetes: Secondary | ICD-10-CM | POA: Diagnosis not present

## 2023-04-20 DIAGNOSIS — R109 Unspecified abdominal pain: Secondary | ICD-10-CM | POA: Diagnosis not present

## 2023-04-20 DIAGNOSIS — E038 Other specified hypothyroidism: Secondary | ICD-10-CM | POA: Diagnosis not present

## 2023-04-20 DIAGNOSIS — R110 Nausea: Secondary | ICD-10-CM | POA: Diagnosis not present

## 2023-04-20 DIAGNOSIS — R638 Other symptoms and signs concerning food and fluid intake: Secondary | ICD-10-CM | POA: Diagnosis not present

## 2023-04-20 DIAGNOSIS — I10 Essential (primary) hypertension: Secondary | ICD-10-CM | POA: Diagnosis not present

## 2023-04-20 DIAGNOSIS — K5090 Crohn's disease, unspecified, without complications: Secondary | ICD-10-CM | POA: Diagnosis not present

## 2023-04-28 DIAGNOSIS — R1084 Generalized abdominal pain: Secondary | ICD-10-CM | POA: Diagnosis not present

## 2023-04-28 DIAGNOSIS — M545 Low back pain, unspecified: Secondary | ICD-10-CM | POA: Diagnosis not present

## 2023-04-28 DIAGNOSIS — Z79891 Long term (current) use of opiate analgesic: Secondary | ICD-10-CM | POA: Diagnosis not present

## 2023-04-28 DIAGNOSIS — G894 Chronic pain syndrome: Secondary | ICD-10-CM | POA: Diagnosis not present

## 2023-05-05 DIAGNOSIS — K5090 Crohn's disease, unspecified, without complications: Secondary | ICD-10-CM | POA: Diagnosis not present

## 2023-05-19 DIAGNOSIS — K5090 Crohn's disease, unspecified, without complications: Secondary | ICD-10-CM | POA: Diagnosis not present

## 2023-05-19 DIAGNOSIS — K50819 Crohn's disease of both small and large intestine with unspecified complications: Secondary | ICD-10-CM | POA: Diagnosis not present

## 2023-05-25 DIAGNOSIS — K50819 Crohn's disease of both small and large intestine with unspecified complications: Secondary | ICD-10-CM | POA: Diagnosis not present

## 2023-05-27 DIAGNOSIS — G894 Chronic pain syndrome: Secondary | ICD-10-CM | POA: Diagnosis not present

## 2023-05-27 DIAGNOSIS — Z79891 Long term (current) use of opiate analgesic: Secondary | ICD-10-CM | POA: Diagnosis not present

## 2023-06-02 DIAGNOSIS — R7303 Prediabetes: Secondary | ICD-10-CM | POA: Diagnosis not present

## 2023-06-02 DIAGNOSIS — I10 Essential (primary) hypertension: Secondary | ICD-10-CM | POA: Diagnosis not present

## 2023-06-10 DIAGNOSIS — G894 Chronic pain syndrome: Secondary | ICD-10-CM | POA: Diagnosis not present

## 2023-06-10 DIAGNOSIS — Z79891 Long term (current) use of opiate analgesic: Secondary | ICD-10-CM | POA: Diagnosis not present

## 2023-06-19 DIAGNOSIS — K5090 Crohn's disease, unspecified, without complications: Secondary | ICD-10-CM | POA: Diagnosis not present

## 2023-06-19 DIAGNOSIS — G894 Chronic pain syndrome: Secondary | ICD-10-CM | POA: Diagnosis not present

## 2023-06-19 DIAGNOSIS — R110 Nausea: Secondary | ICD-10-CM | POA: Diagnosis not present

## 2023-06-19 DIAGNOSIS — Z23 Encounter for immunization: Secondary | ICD-10-CM | POA: Diagnosis not present

## 2023-06-19 DIAGNOSIS — R109 Unspecified abdominal pain: Secondary | ICD-10-CM | POA: Diagnosis not present

## 2023-06-19 DIAGNOSIS — Z1211 Encounter for screening for malignant neoplasm of colon: Secondary | ICD-10-CM | POA: Diagnosis not present

## 2023-06-19 DIAGNOSIS — F17290 Nicotine dependence, other tobacco product, uncomplicated: Secondary | ICD-10-CM | POA: Diagnosis not present

## 2023-06-19 DIAGNOSIS — I10 Essential (primary) hypertension: Secondary | ICD-10-CM | POA: Diagnosis not present

## 2023-06-19 DIAGNOSIS — Z0001 Encounter for general adult medical examination with abnormal findings: Secondary | ICD-10-CM | POA: Diagnosis not present

## 2023-06-19 DIAGNOSIS — R638 Other symptoms and signs concerning food and fluid intake: Secondary | ICD-10-CM | POA: Diagnosis not present

## 2023-07-15 DIAGNOSIS — Z79891 Long term (current) use of opiate analgesic: Secondary | ICD-10-CM | POA: Diagnosis not present

## 2023-07-15 DIAGNOSIS — G894 Chronic pain syndrome: Secondary | ICD-10-CM | POA: Diagnosis not present

## 2023-08-12 DIAGNOSIS — G894 Chronic pain syndrome: Secondary | ICD-10-CM | POA: Diagnosis not present

## 2023-08-18 DIAGNOSIS — K50819 Crohn's disease of both small and large intestine with unspecified complications: Secondary | ICD-10-CM | POA: Diagnosis not present

## 2023-08-18 DIAGNOSIS — K5000 Crohn's disease of small intestine without complications: Secondary | ICD-10-CM | POA: Diagnosis not present

## 2023-08-24 DIAGNOSIS — R110 Nausea: Secondary | ICD-10-CM | POA: Diagnosis not present

## 2023-08-24 DIAGNOSIS — F17290 Nicotine dependence, other tobacco product, uncomplicated: Secondary | ICD-10-CM | POA: Diagnosis not present

## 2023-08-24 DIAGNOSIS — K5090 Crohn's disease, unspecified, without complications: Secondary | ICD-10-CM | POA: Diagnosis not present

## 2023-08-24 DIAGNOSIS — Z5181 Encounter for therapeutic drug level monitoring: Secondary | ICD-10-CM | POA: Diagnosis not present

## 2023-08-24 DIAGNOSIS — G894 Chronic pain syndrome: Secondary | ICD-10-CM | POA: Diagnosis not present

## 2023-08-24 DIAGNOSIS — R109 Unspecified abdominal pain: Secondary | ICD-10-CM | POA: Diagnosis not present

## 2023-08-24 DIAGNOSIS — R638 Other symptoms and signs concerning food and fluid intake: Secondary | ICD-10-CM | POA: Diagnosis not present

## 2023-08-24 DIAGNOSIS — I10 Essential (primary) hypertension: Secondary | ICD-10-CM | POA: Diagnosis not present

## 2023-09-09 DIAGNOSIS — G894 Chronic pain syndrome: Secondary | ICD-10-CM | POA: Diagnosis not present

## 2023-09-29 DIAGNOSIS — K5000 Crohn's disease of small intestine without complications: Secondary | ICD-10-CM | POA: Diagnosis not present

## 2023-10-08 DIAGNOSIS — K5090 Crohn's disease, unspecified, without complications: Secondary | ICD-10-CM | POA: Diagnosis not present

## 2023-10-08 DIAGNOSIS — G894 Chronic pain syndrome: Secondary | ICD-10-CM | POA: Diagnosis not present

## 2023-11-05 DIAGNOSIS — G894 Chronic pain syndrome: Secondary | ICD-10-CM | POA: Diagnosis not present

## 2023-11-05 DIAGNOSIS — K5090 Crohn's disease, unspecified, without complications: Secondary | ICD-10-CM | POA: Diagnosis not present

## 2023-11-05 DIAGNOSIS — Z79891 Long term (current) use of opiate analgesic: Secondary | ICD-10-CM | POA: Diagnosis not present
